# Patient Record
Sex: Female | Born: 1960 | Race: White | Hispanic: Yes | Smoking: Never smoker
Health system: Southern US, Community
[De-identification: ages and names within clinical notes are randomized; demographics above are authoritative.]

## PROBLEM LIST (undated history)

## (undated) DIAGNOSIS — Z7982 Long term (current) use of aspirin: Secondary | ICD-10-CM

## (undated) DIAGNOSIS — D219 Benign neoplasm of connective and other soft tissue, unspecified: Secondary | ICD-10-CM

## (undated) DIAGNOSIS — I209 Angina pectoris, unspecified: Secondary | ICD-10-CM

## (undated) DIAGNOSIS — I25118 Atherosclerotic heart disease of native coronary artery with other forms of angina pectoris: Secondary | ICD-10-CM

## (undated) DIAGNOSIS — I201 Angina pectoris with documented spasm: Secondary | ICD-10-CM

## (undated) DIAGNOSIS — G35D Multiple sclerosis, unspecified: Secondary | ICD-10-CM

## (undated) DIAGNOSIS — N8003 Adenomyosis of the uterus: Secondary | ICD-10-CM

## (undated) DIAGNOSIS — Z8639 Personal history of other endocrine, nutritional and metabolic disease: Secondary | ICD-10-CM

## (undated) DIAGNOSIS — M797 Fibromyalgia: Secondary | ICD-10-CM

## (undated) DIAGNOSIS — K589 Irritable bowel syndrome without diarrhea: Secondary | ICD-10-CM

## (undated) DIAGNOSIS — N189 Chronic kidney disease, unspecified: Secondary | ICD-10-CM

## (undated) DIAGNOSIS — H9190 Unspecified hearing loss, unspecified ear: Secondary | ICD-10-CM

## (undated) DIAGNOSIS — I219 Acute myocardial infarction, unspecified: Secondary | ICD-10-CM

## (undated) DIAGNOSIS — H9192 Unspecified hearing loss, left ear: Secondary | ICD-10-CM

## (undated) DIAGNOSIS — T8859XA Other complications of anesthesia, initial encounter: Secondary | ICD-10-CM

## (undated) DIAGNOSIS — G43909 Migraine, unspecified, not intractable, without status migrainosus: Secondary | ICD-10-CM

## (undated) DIAGNOSIS — I701 Atherosclerosis of renal artery: Secondary | ICD-10-CM

## (undated) DIAGNOSIS — I5189 Other ill-defined heart diseases: Secondary | ICD-10-CM

## (undated) DIAGNOSIS — R000 Tachycardia, unspecified: Secondary | ICD-10-CM

## (undated) DIAGNOSIS — I773 Arterial fibromuscular dysplasia: Secondary | ICD-10-CM

## (undated) DIAGNOSIS — E785 Hyperlipidemia, unspecified: Secondary | ICD-10-CM

## (undated) DIAGNOSIS — R7303 Prediabetes: Secondary | ICD-10-CM

## (undated) DIAGNOSIS — I169 Hypertensive crisis, unspecified: Secondary | ICD-10-CM

## (undated) DIAGNOSIS — F419 Anxiety disorder, unspecified: Secondary | ICD-10-CM

## (undated) DIAGNOSIS — Z86010 Personal history of colonic polyps: Secondary | ICD-10-CM

## (undated) DIAGNOSIS — I499 Cardiac arrhythmia, unspecified: Secondary | ICD-10-CM

## (undated) DIAGNOSIS — I639 Cerebral infarction, unspecified: Secondary | ICD-10-CM

## (undated) DIAGNOSIS — E039 Hypothyroidism, unspecified: Secondary | ICD-10-CM

## (undated) DIAGNOSIS — G8929 Other chronic pain: Secondary | ICD-10-CM

## (undated) DIAGNOSIS — T4145XA Adverse effect of unspecified anesthetic, initial encounter: Secondary | ICD-10-CM

## (undated) DIAGNOSIS — M549 Dorsalgia, unspecified: Secondary | ICD-10-CM

## (undated) DIAGNOSIS — Z9889 Other specified postprocedural states: Secondary | ICD-10-CM

## (undated) DIAGNOSIS — N809 Endometriosis, unspecified: Secondary | ICD-10-CM

## (undated) DIAGNOSIS — Z9289 Personal history of other medical treatment: Secondary | ICD-10-CM

## (undated) DIAGNOSIS — I10 Essential (primary) hypertension: Secondary | ICD-10-CM

## (undated) DIAGNOSIS — N800 Endometriosis of uterus: Secondary | ICD-10-CM

## (undated) DIAGNOSIS — F329 Major depressive disorder, single episode, unspecified: Secondary | ICD-10-CM

## (undated) HISTORY — DX: Essential (primary) hypertension: I10

## (undated) HISTORY — DX: Adenomyosis of the uterus: N80.03

## (undated) HISTORY — PX: COLONOSCOPY: SHX174

## (undated) HISTORY — DX: Anxiety disorder, unspecified: F41.9

## (undated) HISTORY — DX: Major depressive disorder, single episode, unspecified: F32.9

## (undated) HISTORY — DX: Chronic kidney disease, unspecified: N18.9

## (undated) HISTORY — DX: Personal history of other endocrine, nutritional and metabolic disease: Z86.39

## (undated) HISTORY — DX: Personal history of colonic polyps: Z86.010

## (undated) HISTORY — DX: Hyperlipidemia, unspecified: E78.5

## (undated) HISTORY — PX: OTHER SURGICAL HISTORY: SHX169

## (undated) HISTORY — PX: VAGINAL HYSTERECTOMY: SUR661

## (undated) HISTORY — DX: Benign neoplasm of connective and other soft tissue, unspecified: D21.9

## (undated) HISTORY — PX: BACK SURGERY: SHX140

## (undated) HISTORY — DX: Atherosclerosis of renal artery: I70.1

## (undated) HISTORY — DX: Endometriosis, unspecified: N80.9

## (undated) HISTORY — PX: SPINE SURGERY: SHX786

## (undated) HISTORY — DX: Endometriosis of uterus: N80.0

## (undated) HISTORY — DX: Fibromyalgia: M79.7

## (undated) HISTORY — DX: Migraine, unspecified, not intractable, without status migrainosus: G43.909

## (undated) HISTORY — DX: Hypertensive crisis, unspecified: I16.9

## (undated) HISTORY — DX: Acute myocardial infarction, unspecified: I21.9

## (undated) HISTORY — PX: KNEE ARTHROSCOPY: SUR90

## (undated) HISTORY — DX: Personal history of other medical treatment: Z92.89

## (undated) HISTORY — PX: HIP SURGERY: SHX245

## (undated) HISTORY — PX: POLYPECTOMY: SHX149

---

## 1986-06-07 DIAGNOSIS — I10 Essential (primary) hypertension: Secondary | ICD-10-CM

## 1986-06-07 HISTORY — DX: Essential (primary) hypertension: I10

## 1997-06-07 DIAGNOSIS — M797 Fibromyalgia: Secondary | ICD-10-CM

## 1997-06-07 HISTORY — DX: Fibromyalgia: M79.7

## 1998-06-07 DIAGNOSIS — R000 Tachycardia, unspecified: Secondary | ICD-10-CM

## 1998-06-07 DIAGNOSIS — E785 Hyperlipidemia, unspecified: Secondary | ICD-10-CM

## 1998-06-07 DIAGNOSIS — I4711 Inappropriate sinus tachycardia, so stated: Secondary | ICD-10-CM

## 1998-06-07 HISTORY — DX: Tachycardia, unspecified: R00.0

## 1998-06-07 HISTORY — DX: Hyperlipidemia, unspecified: E78.5

## 1998-06-07 HISTORY — DX: Inappropriate sinus tachycardia, so stated: I47.11

## 1999-06-08 DIAGNOSIS — F419 Anxiety disorder, unspecified: Secondary | ICD-10-CM

## 1999-06-08 DIAGNOSIS — F32A Depression, unspecified: Secondary | ICD-10-CM

## 1999-06-08 HISTORY — DX: Anxiety disorder, unspecified: F41.9

## 1999-06-08 HISTORY — DX: Depression, unspecified: F32.A

## 2001-06-07 DIAGNOSIS — Z8639 Personal history of other endocrine, nutritional and metabolic disease: Secondary | ICD-10-CM

## 2001-06-07 DIAGNOSIS — E039 Hypothyroidism, unspecified: Secondary | ICD-10-CM

## 2001-06-07 HISTORY — DX: Hypothyroidism, unspecified: E03.9

## 2001-06-07 HISTORY — DX: Personal history of other endocrine, nutritional and metabolic disease: Z86.39

## 2004-06-07 DIAGNOSIS — I6350 Cerebral infarction due to unspecified occlusion or stenosis of unspecified cerebral artery: Secondary | ICD-10-CM | POA: Insufficient documentation

## 2004-06-07 DIAGNOSIS — I639 Cerebral infarction, unspecified: Secondary | ICD-10-CM

## 2004-06-07 HISTORY — DX: Cerebral infarction, unspecified: I63.9

## 2007-06-08 DIAGNOSIS — I773 Arterial fibromuscular dysplasia: Secondary | ICD-10-CM

## 2007-06-08 DIAGNOSIS — I219 Acute myocardial infarction, unspecified: Secondary | ICD-10-CM

## 2007-06-08 DIAGNOSIS — Z9889 Other specified postprocedural states: Secondary | ICD-10-CM

## 2007-06-08 HISTORY — PX: RENAL ARTERY STENT: SHX2321

## 2007-06-08 HISTORY — PX: CARDIAC CATHETERIZATION: SHX172

## 2007-06-08 HISTORY — DX: Other specified postprocedural states: Z98.890

## 2007-06-08 HISTORY — DX: Arterial fibromuscular dysplasia: I77.3

## 2007-06-08 HISTORY — DX: Acute myocardial infarction, unspecified: I21.9

## 2008-05-13 HISTORY — PX: ROBOTIC ASSISTED LAP VAGINAL HYSTERECTOMY: SHX2362

## 2009-10-11 ENCOUNTER — Encounter (INDEPENDENT_AMBULATORY_CARE_PROVIDER_SITE_OTHER): Payer: Self-pay | Admitting: Nurse Practitioner

## 2010-03-16 ENCOUNTER — Inpatient Hospital Stay (HOSPITAL_COMMUNITY)
Admission: EM | Admit: 2010-03-16 | Discharge: 2010-03-19 | Payer: Self-pay | Source: Home / Self Care | Admitting: Emergency Medicine

## 2010-03-16 ENCOUNTER — Encounter: Payer: Self-pay | Admitting: Cardiology

## 2010-03-17 ENCOUNTER — Ambulatory Visit: Payer: Self-pay | Admitting: Cardiology

## 2010-03-18 ENCOUNTER — Encounter (INDEPENDENT_AMBULATORY_CARE_PROVIDER_SITE_OTHER): Payer: Self-pay | Admitting: Internal Medicine

## 2010-04-09 ENCOUNTER — Ambulatory Visit: Payer: Self-pay | Admitting: Cardiology

## 2010-04-09 ENCOUNTER — Telehealth (INDEPENDENT_AMBULATORY_CARE_PROVIDER_SITE_OTHER): Payer: Self-pay

## 2010-04-09 ENCOUNTER — Ambulatory Visit: Payer: Self-pay | Admitting: Nurse Practitioner

## 2010-04-09 DIAGNOSIS — M549 Dorsalgia, unspecified: Secondary | ICD-10-CM | POA: Insufficient documentation

## 2010-04-09 DIAGNOSIS — R079 Chest pain, unspecified: Secondary | ICD-10-CM | POA: Insufficient documentation

## 2010-04-09 DIAGNOSIS — I959 Hypotension, unspecified: Secondary | ICD-10-CM | POA: Insufficient documentation

## 2010-04-09 LAB — CONVERTED CEMR LAB
Cholesterol, target level: 200 mg/dL
HDL goal, serum: 40 mg/dL
LDL Goal: 70 mg/dL

## 2010-04-10 DIAGNOSIS — I10 Essential (primary) hypertension: Secondary | ICD-10-CM | POA: Insufficient documentation

## 2010-04-10 DIAGNOSIS — E785 Hyperlipidemia, unspecified: Secondary | ICD-10-CM | POA: Insufficient documentation

## 2010-04-10 DIAGNOSIS — F329 Major depressive disorder, single episode, unspecified: Secondary | ICD-10-CM

## 2010-04-10 DIAGNOSIS — F32A Depression, unspecified: Secondary | ICD-10-CM | POA: Insufficient documentation

## 2010-04-10 DIAGNOSIS — I701 Atherosclerosis of renal artery: Secondary | ICD-10-CM | POA: Insufficient documentation

## 2010-04-10 DIAGNOSIS — G43909 Migraine, unspecified, not intractable, without status migrainosus: Secondary | ICD-10-CM | POA: Insufficient documentation

## 2010-04-10 DIAGNOSIS — E039 Hypothyroidism, unspecified: Secondary | ICD-10-CM | POA: Insufficient documentation

## 2010-04-10 DIAGNOSIS — Z9889 Other specified postprocedural states: Secondary | ICD-10-CM | POA: Insufficient documentation

## 2010-04-10 DIAGNOSIS — F419 Anxiety disorder, unspecified: Secondary | ICD-10-CM | POA: Insufficient documentation

## 2010-04-10 DIAGNOSIS — M797 Fibromyalgia: Secondary | ICD-10-CM | POA: Insufficient documentation

## 2010-04-13 ENCOUNTER — Encounter (INDEPENDENT_AMBULATORY_CARE_PROVIDER_SITE_OTHER): Payer: Self-pay | Admitting: Nurse Practitioner

## 2010-04-13 ENCOUNTER — Encounter (HOSPITAL_COMMUNITY)
Admission: RE | Admit: 2010-04-13 | Discharge: 2010-06-06 | Payer: Self-pay | Source: Home / Self Care | Attending: Cardiology | Admitting: Cardiology

## 2010-04-13 ENCOUNTER — Ambulatory Visit: Payer: Self-pay

## 2010-04-13 ENCOUNTER — Encounter: Payer: Self-pay | Admitting: Cardiology

## 2010-04-14 ENCOUNTER — Encounter (INDEPENDENT_AMBULATORY_CARE_PROVIDER_SITE_OTHER): Payer: Self-pay | Admitting: Nurse Practitioner

## 2010-04-23 ENCOUNTER — Ambulatory Visit: Payer: Self-pay | Admitting: Nurse Practitioner

## 2010-04-24 ENCOUNTER — Encounter (INDEPENDENT_AMBULATORY_CARE_PROVIDER_SITE_OTHER): Payer: Self-pay | Admitting: Nurse Practitioner

## 2010-04-24 LAB — CONVERTED CEMR LAB
ALT: 20 units/L (ref 0–35)
Alkaline Phosphatase: 105 units/L (ref 39–117)
CO2: 25 meq/L (ref 19–32)
Cholesterol: 179 mg/dL (ref 0–200)
Creatinine, Ser: 0.83 mg/dL (ref 0.40–1.20)
LDL Cholesterol: 101 mg/dL — ABNORMAL HIGH (ref 0–99)
Sodium: 136 meq/L (ref 135–145)
Total Bilirubin: 0.4 mg/dL (ref 0.3–1.2)
Total CHOL/HDL Ratio: 3.7
Total Protein: 7.6 g/dL (ref 6.0–8.3)
Triglycerides: 146 mg/dL (ref <150)
VLDL: 29 mg/dL (ref 0–40)

## 2010-04-25 ENCOUNTER — Emergency Department (HOSPITAL_COMMUNITY)
Admission: EM | Admit: 2010-04-25 | Discharge: 2010-04-25 | Payer: Self-pay | Source: Home / Self Care | Admitting: Emergency Medicine

## 2010-05-11 ENCOUNTER — Ambulatory Visit: Payer: Self-pay

## 2010-05-11 ENCOUNTER — Ambulatory Visit: Payer: Self-pay | Admitting: Cardiology

## 2010-05-14 ENCOUNTER — Ambulatory Visit: Payer: Self-pay | Admitting: Nurse Practitioner

## 2010-05-18 ENCOUNTER — Ambulatory Visit: Payer: Self-pay | Admitting: Nurse Practitioner

## 2010-05-23 ENCOUNTER — Inpatient Hospital Stay (HOSPITAL_COMMUNITY)
Admission: EM | Admit: 2010-05-23 | Discharge: 2010-05-25 | Payer: Self-pay | Source: Home / Self Care | Attending: Internal Medicine | Admitting: Internal Medicine

## 2010-05-25 ENCOUNTER — Encounter (INDEPENDENT_AMBULATORY_CARE_PROVIDER_SITE_OTHER): Payer: Self-pay | Admitting: Nurse Practitioner

## 2010-05-28 ENCOUNTER — Ambulatory Visit: Payer: Self-pay | Admitting: Nurse Practitioner

## 2010-06-12 ENCOUNTER — Ambulatory Visit
Admission: RE | Admit: 2010-06-12 | Discharge: 2010-06-12 | Payer: Self-pay | Source: Home / Self Care | Attending: Nurse Practitioner | Admitting: Nurse Practitioner

## 2010-06-13 ENCOUNTER — Encounter (INDEPENDENT_AMBULATORY_CARE_PROVIDER_SITE_OTHER): Payer: Self-pay | Admitting: Nurse Practitioner

## 2010-06-16 LAB — CONVERTED CEMR LAB
Free T4: 1.16 ng/dL (ref 0.80–1.80)
TSH: 11.572 microintl units/mL — ABNORMAL HIGH (ref 0.350–4.500)

## 2010-06-26 ENCOUNTER — Ambulatory Visit: Admit: 2010-06-26 | Payer: Self-pay | Admitting: Nurse Practitioner

## 2010-06-30 ENCOUNTER — Ambulatory Visit: Admit: 2010-06-30 | Payer: Self-pay | Admitting: Nurse Practitioner

## 2010-07-01 ENCOUNTER — Encounter (INDEPENDENT_AMBULATORY_CARE_PROVIDER_SITE_OTHER): Payer: Self-pay | Admitting: *Deleted

## 2010-07-07 NOTE — Assessment & Plan Note (Signed)
Summary: eph   Primary Provider:  Daphine Deutscher   History of Present Illness: 50 yo with history of fibromuscular dysplasia of the renal arteries, HTN, and possible coronary vasospasm presents for hospital followup to establish cardiology care.  Patient was admitted to the hospital in [place] in 2009 with severe chest pain.  At that time, she had a left heart cath showing no angiographic coronary disease but the catheter did induce RCA spasm, leading to concern that her chest pain might be coronary vasospasm.  She also had HTN found to be probably related to fibromuscular dysplasia of the renal arteries.  She had a stent placed in the right renal artery in 2009.    She was admitted to [facility] in 10/11 with chest pain.  She ruled out for MI and echo was unremarkable.  It was thought that the chest pain might be related to the stress of her recent move to [place], and amlodipine was increased for treatment of possible coronary vasospasm. Since leaving the hospital, patient has been getting lightheaded with standing.  She is orthostatic by blood pressure today in the office.  She has had no more severe chest pain, like what brought her to the ER.  However, she has been getting chest pressure with exertion.  Walking up a flight of steps will lead to chest pressure.   ECG: NSR, normal  Labs (10/11): LDL 136, HDL 44, TGs 159, creatinine 1.06, K 3.8  Problems Prior to Update: None  Current Medications (verified): 1)  Aspirin 81 Mg Tbec (Aspirin) .... Take One Tablet By Mouth Daily 2)  Plavix 75 Mg Tabs (Clopidogrel Bisulfate) .... Take One Tablet By Mouth Daily 3)  Pravastatin Sodium 40 Mg Tabs (Pravastatin Sodium) .... Take One Tablet By Mouth Daily At Bedtime 4)  Atenolol 100 Mg Tabs (Atenolol) .... Take One Tablet By Mouth Daily 5)  Amlodipine Besylate 10 Mg Tabs (Amlodipine Besylate) .... Take One Tablet By Mouth Daily 6)  Citalopram Hydrobromide 40 Mg Tabs (Citalopram Hydrobromide) .... Please Take  One Tablet By Mouth Daily. 7)  Levothyroxine Sodium 125 Mcg Tabs (Levothyroxine Sodium) .... Please Take One Tablet Two Times A Day. 8)  Fioricet/codeine 50-325-40-30 Mg Caps (Butalbital-Apap-Caff-Cod) .... Please Take As Needed. 9)  Clonazepam 0.5 Mg Tabs (Clonazepam) .... Please Take As Needed. 10)  Vitamin D (Ergocalciferol) 50000 Unit Caps (Ergocalciferol) .... 1.25 Mg Weekly  Allergies (verified): 1)  ! Compazine 2)  ! Imitrex  Past History:  Past Medical History: 1. Migraine headaches 2. Chest pain: No angiographic coronary disease on cath in 2009 in [place].  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  Suspect coronary vasospasm versus microvascular obstruction.  3. HTN: Likely related to renal artery stenosis.  4. 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  She had a renal artery stent in 2009.  5. Hyperlipidemia 6. Fibromyalgia 7. Echo (10/11): EF 60-65%, grade I diastolic dysfunction     Family History: Mother with MI at 44, Father with MI at 71, brother with MI at 73 and died with stroke at 33  Social History: Moved to [place] from [place] with her husband.  Denies smoking cigarettes or using illegal drugs Alcohol Use - no Drug Use - no  Review of Systems       All systems reviewed and negative except as per HPI.   Vital Signs:  Patient profile:   50 year old female Height:      63 inches Weight:      138.25 pounds  Pulse rate:   72 / minute Pulse (ortho):   69 / minute Resp:     15 per minute BP supine:   130 / 80  (right arm) BP sitting:   122 / 70  (right arm) BP standing:   120 / 72  (right arm)  Vitals Entered By: Ellender Hose RN (April 09, 2010 9:20 AM)  Serial Vital Signs/Assessments:  Time      Position  BP       Pulse  Resp  Temp     By           Lying RA  130/80   73                    Ellender Hose RN           Sitting   122/70   [address] RN           Standing  120/72   69                     Ellender Hose RN           Standing  108/78                         Ellender Hose RN           Standing  161/09                         Whitney Maeola Sarah RN  Is Patient Diabetic? No Comments pt has been c/o dizziness at no specific time. SOB with chest pressure.    Physical Exam  General:  Well developed, well nourished, in no acute distress. Head:  normocephalic and atraumatic Nose:  no deformity, discharge, inflammation, or lesions Mouth:  Teeth, gums and palate normal. Oral mucosa normal. Neck:  Neck supple, no JVD. No masses, thyromegaly or abnormal cervical nodes. Lungs:  Clear bilaterally to auscultation and percussion. Heart:  Non-displaced PMI, chest non-tender; regular rate and rhythm, S1, S2 without murmurs, rubs or gallops. Carotid upstroke normal, no bruit.  Pedals normal pulses. No edema, no varicosities. Abdomen:  Bowel sounds positive; abdomen soft and non-tender without masses, organomegaly, or hernias noted. No hepatosplenomegaly. Msk:  Back normal, normal gait. Muscle strength and tone normal. Extremities:  No clubbing or cyanosis. Neurologic:  Alert and oriented x 3. Skin:  Intact without lesions or rashes. Psych:  anxious.     Impression & Recommendations:  Problem # 1:  HYPOTENSION (ICD-458.9) Patient has orthostatic hypotension and gets symptomatic lightheadedness with standing.  I will have her decrease amlodipine to 5 mg daily.  If she continues to have lightheadedness, would next cut atenolol in half.   Problem # 2:  CHEST PAIN (ICD-786.50) I suspect that the patient has either coronary vasospasm or coronary microvascular disease (Syndrome X).  Chest pain with exertion certainly is seen with microvascular disease and she is the right demographic.  She had a cath with no angiographic coronary disease in 2009.  - Continue lower dose of amlodipine for vasospasm prevention.  - Continue atenolol as this can decrease anginal-type pain in patients with  microvascular disease, statin also can help decrease symptoms from microvascular disease.  - I will have her  do an ETT-myoview to rule out evidence for macrovascular disease given her risk factors and her family history of premature CAD.   Problem # 3:  RENAL ARTERY STENOSIS (ICD-440.1) Renal artery stenosis due to fibromuscular dysplasia with stent in 2009.  She has been on Plavix for about 2 years.  It is expensive for her.  I think it is reasonable to stop Plavix and increase ASA to 162 mg daily.  She also needs to get renal artery dopplers.    Other Orders: EKG w/ Interpretation (93000) Nuclear Stress Test (Nuc Stress Test) Renal Artery Duplex (Renal Artery Duplex)  Patient Instructions: 1)  Your physician recommends that you schedule a follow-up appointment in: 1 month with Dr Shirlee Latch 2)  Your physician recommends that you return for a FASTING lipid and liver profile: 12/11 for 272.0 v58.69 3)  Your physician has recommended you make the following change in your medication: Stop Plavix, increase Asa to 81 mg two a day, decrease amlodipine to 5 mg a day 4)  Your physician has requested that you have a renal artery duplex. During this test, an ultrasound is used to evaluate blood flow to the kidneys. Allow one hour for this exam. Do not eat after midnight the day before and avoid carbonated beverages. Take your medications as you usually do. 5)  Your physician has requested that you have an exercise stress myoview.  For further information please visit [email or web address]  Please follow instruction sheet, as given. Prescriptions: AMLODIPINE BESYLATE 5 MG TABS (AMLODIPINE BESYLATE) one daily  #30 x 11   Entered by:   Charolotte Capuchin, RN   Authorized by:   [name], MD   Signed by:   Charolotte Capuchin, RN on 04/09/2010   Method used:   Electronically to        [facility] [phone]* (retail)       [address]       [place], [place]  [address]       Ph: [phone]        Fax: [phone]   RxID:   [id number]

## 2010-07-07 NOTE — Progress Notes (Signed)
Summary: Nuc. Pre-Procedure  Phone Note Outgoing Call Call back at [facility] Phone [phone]   Call placed by: Irean Hong, RN,  April 09, 2010 2:09 PM Summary of Call: Reviewed information on Myoview Information Sheet (see scanned document for further details).  Spoke with patient.     Nuclear Med Background Indications for Stress Test: Evaluation for Ischemia, Post Hospital  Indications Comments: 03/19/10 Discharged: CP, (-) enzymes.  History: Echo, Heart Catheterization  History Comments: '09 Cath: Near NL coronaries,RCA with severe spasms, EF=65%([place]). 03/2010 Echo: EF=60-65%.  Symptoms: Chest Pain, Dizziness, Nausea  Symptoms Comments: CP radiates to RUE.   Nuclear Pre-Procedure Cardiac Risk Factors: Family History - CAD, Hypertension, Lipids, PVD

## 2010-07-07 NOTE — Letter (Signed)
Summary: PT INFORMATION SHEET  PT INFORMATION SHEET   Imported By: Arta Bruce 04/13/2010 16:25:53  _____________________________________________________________________  External Attachment:    Type:   Image     Comment:   External Document

## 2010-07-07 NOTE — Letter (Signed)
[facility]   Imported By: Arta Bruce 04/10/2010 15:04:48  _____________________________________________________________________  External Attachment:    Type:   Image     Comment:   External Document

## 2010-07-07 NOTE — Letter (Signed)
Summary: REQUESTING RECORDS FROM [facility]  REQUESTING RECORDS FROM [facility]   Imported By: Arta Bruce 04/16/2010 15:34:24  _____________________________________________________________________  External Attachment:    Type:   Image     Comment:   External Document

## 2010-07-07 NOTE — Assessment & Plan Note (Signed)
Summary: Cardiology Nuclear Testing  Nuclear Med Background Indications for Stress Test: Evaluation for Ischemia, Post Hospital  Indications Comments: 03/19/10 Discharged: CP, (-) enzymes.  History: Echo, Heart Catheterization  History Comments: '09 Cath: Near NL coronaries,RCA with severe spasms, EF=65%([place]). 03/2010 Echo: EF=60-65%.  Symptoms: Chest Pain, Dizziness, Nausea  Symptoms Comments: CP radiates to RUE.   Nuclear Pre-Procedure Cardiac Risk Factors: Family History - CAD, Hypertension, Lipids, PVD Caffeine/Decaff Intake: none NPO After: 8:30 AM Lungs: clear IV 0.9% NS with Angio Cath: 22g     IV Site: R Hand IV Started by: Cathlyn Parsons, RN Chest Size (in) 34     Cup Size C     Height (in): 139.0 Weight (lb): 136 BMI: 4.97 Tech Comments: Atenolol held x 27hrs.  This patient was scheduled to walk the treadmill, but with upcoming back surgery she was switched to a walking Lexiscan. In recovery she became very symptomatic, extreme fatigue and (L) shoulder pain radiating to her neck and jaw.  Nuclear Med Study 1 or 2 day study:  1 day     Stress Test Type:  Treadmill/Lexiscan Reading MD:  Kristeen Miss, MD     Referring MD:  D.McLean Resting Radionuclide:  Technetium 28m Tetrofosmin     Resting Radionuclide Dose:  10.9 mCi  Stress Radionuclide:  Technetium 52m Tetrofosmin     Stress Radionuclide Dose:  33 mCi   Stress Protocol  Max Systolic BP: 155 mm Hg Lexiscan: 0.4 mg   Stress Test Technologist:  Milana Na, EMT-P     Nuclear Technologist:  Domenic Polite, CNMT  Rest Procedure  Myocardial perfusion imaging was performed at rest 45 minutes following the intravenous administration of Technetium 13m Tetrofosmin.  Stress Procedure  The patient received IV Lexiscan 0.4 mg over 15-seconds with concurrent low level exercise and then Technetium 49m Tetrofosmin was injected at 30-seconds while the patient continued walking one more minute.  There were  no significant changes with Lexiscan.  Quantitative spect images were obtained after a 45 minute delay.  QPS Raw Data Images:  Normal; no motion artifact; normal heart/lung ratio. Stress Images:  Normal homogeneous uptake in all areas of the myocardium. Rest Images:  Normal homogeneous uptake in all areas of the myocardium. Transient Ischemic Dilatation:  0.98  (Normal <1.22)  Lung/Heart Ratio:  0.30  (Normal <0.45)  Quantitative Gated Spect Images QGS EDV:  56 ml QGS ESV:  12 ml QGS EF:  79 % QGS cine images:  Normal motion  Findings Normal nuclear study      Overall Impression  Exercise Capacity: Lexiscan with low level exercise BP Response: Normal blood pressure response. Clinical Symptoms: Significant left shoulder pain radiating to neck. ECG Impression: No significant ST segment change suggestive of ischemia. Overall Impression Comments: The nuclear images are normal There is no scar or ischemia.   Appended Document: Cardiology Nuclear Testing normal study  Appended Document: Cardiology Nuclear Testing pt given results

## 2010-07-07 NOTE — Letter (Signed)
Summary: [facility]//LUMBAR SPINE  [facility] [place]//LUMBAR SPINE   Imported By: Arta Bruce 04/10/2010 15:08:03  _____________________________________________________________________  External Attachment:    Type:   Image     Comment:   External Document

## 2010-07-07 NOTE — Assessment & Plan Note (Signed)
Summary: Hospital F/U   Vital Signs:  Patient profile:   50 year old female Menstrual status:  hysterectomy Height:      139.0 inches Weight:      63.50 pounds BMI:     2.32 BSA:     2.11 Temp:     97.3 degrees F oral Pulse rate:   84 / minute Pulse rhythm:   regular Resp:     20 per minute BP sitting:   110 / 70  (left arm) Cuff size:   regular  Vitals Entered By: Levon Hedger (April 09, 2010 3:21 PM) CC: Depression, Hypertension Management, Lipid Management, Headache Is Patient Diabetic? No Pain Assessment Patient in pain? yes     Location: back Intensity: 10  Does patient need assistance? Functional Status Self care Ambulation Normal     Menstrual Status hysterectomy   CC:  Depression, Hypertension Management, Lipid Management, and Headache.  History of Present Illness:  Pt into the office to establish care. Pt moved to this location from [place] 1 month ago. Pt was recently admitted to the hospital on March 16, 2010 Cardiac enzymes were negative Cardiac cath report from 2009 (received from [place]) showed normal coronary arteries 2D echo was negative  Hypothyroidism - TSH 25.413 in the hospital.  Synthroid was increased from to . Pt will need repeat thyroid function tests in 4-6 weeks  Hyperlipidemia - pt started on zocor and transitioned to pravachol for better control.  LDL goal is 70  Fibromyalgia - Pt was previously prescribed meds but feels like it has not been effective.  Migraine Headaches - triggered during hospital but thought to be secondary to nitro   Hypertension History:      She denies headache, chest pain, and palpitations.  She notes no problems with any antihypertensive medication side effects.        Positive major cardiovascular risk factors include hyperlipidemia and hypertension.  Negative major cardiovascular risk factors include female age less than 3 years old and non-tobacco-user status.    Positive history for target organ damage include prior stroke (or TIA) and peripheral vascular disease.    Lipid Management History:      Positive NCEP/ATP III risk factors include hypertension, prior stroke (or TIA), and peripheral vascular disease.  Negative NCEP/ATP III risk factors include female age less than 75 years old and non-tobacco-user status.        Comments include: med started during hospitalization.        Habits & Providers  Alcohol-Tobacco-Diet     Alcohol drinks/day: 0     Tobacco Status: never  Exercise-Depression-Behavior     Does Patient Exercise: no     Have you felt down or hopeless? yes     Have you felt little pleasure in things? yes     Depression Counseling: further diagnostic testing and/or other treatment is indicated     Drug Use: never  Allergies (verified): 1)  ! Compazine 2)  ! Imitrex  Social History: Smoking Status:  never Drug Use:  never Does Patient Exercise:  no  Review of Systems General:  Complains of sleep disorder; unable to sleep due to back. CV:  Complains of chest pain or discomfort. Resp:  Denies cough. GI:  Denies abdominal pain, nausea, and vomiting. MS:  Complains of joint pain, low back pain, and muscle; radiation of pain down into the right leg. Neuro:  Complains of tingling. Psych:  Complains of depression.  Physical Exam  General:  alert.  Head:  normocephalic.   Lungs:  normal breath sounds.   Heart:  normal rate and regular rhythm.   Abdomen:  normal bowel sounds.   Neurologic:  alert & oriented X3.     Detailed Back/Spine Exam  Lumbosacral Exam:  Inspection-deformity:    Abnormal Palpation-spinal tenderness:  Abnormal Lying Straight Leg Raise:    Right:  positive    Left:  negative   Impression & Recommendations:  Problem # 1:  BACK PAIN (ICD-724.5)  need to review surgery from previous PCP  Orders: Ketorolac-Toradol 15mg  (Z6109) Depo- Medrol 40mg  (J1030) Admin of Therapeutic Inj   intramuscular or subcutaneous (60454)  Her updated medication list for this problem includes:    Naproxen 500 Mg Tabs (Naproxen) ..... One tablet by mouth two times a day as needed for pain    Hydrocodone-acetaminophen 5-500 Mg Tabs (Hydrocodone-acetaminophen) ..... One tablet by mouth daily as needed for pain  Problem # 2:  HYPERTENSION, BENIGN ESSENTIAL (ICD-401.1)  Her updated medication list for this problem includes:    Amlodipine Besylate 5 Mg Tabs (Amlodipine besylate) ..... One daily  Problem # 3:  HYPERLIPIDEMIA (ICD-272.4) will need to recheck labs in the next 2 weeks  Problem # 4:  FIBROMYALGIA (ICD-729.1)  Her updated medication list for this problem includes:    Naproxen 500 Mg Tabs (Naproxen) ..... One tablet by mouth two times a day as needed for pain    Hydrocodone-acetaminophen 5-500 Mg Tabs (Hydrocodone-acetaminophen) ..... One tablet by mouth daily as needed for pain  Complete Medication List: 1)  Amlodipine Besylate 5 Mg Tabs (Amlodipine besylate) .... One daily 2)  Levothyroxine Sodium 125 Mcg Tabs (Levothyroxine sodium) .... Two tablets by mouth daily 3)  Alprazolam 0.5 Mg Tabs (Alprazolam) .... One tablet by mouth daily as needed for anxiety 4)  Savella Titration Pack 12.5 & 25 & 50 Mg Misc (Milnacipran hcl) .... Take according to titration packet 5)  Naproxen 500 Mg Tabs (Naproxen) .... One tablet by mouth two times a day as needed for pain 6)  Hydrocodone-acetaminophen 5-500 Mg Tabs (Hydrocodone-acetaminophen) .... One tablet by mouth daily as needed for pain  Other Orders: Misc. Referral (Misc. Ref)  Hypertension Assessment/Plan:      The patient's hypertensive risk group is category C: Target organ damage and/or diabetes.  Today's blood pressure is 110/70.  Her blood pressure goal is < 140/90.  Lipid Assessment/Plan:      Based on NCEP/ATP III, the patient's risk factor category is "history of coronary disease, peripheral vascular disease, cerebrovascular  disease, or aortic aneurysm".  The patient's lipid goals are as follows: Total cholesterol goal is 200; LDL cholesterol goal is 70; HDL cholesterol goal is 40; Triglyceride goal is 150.  Her LDL cholesterol goal has not been met.    Patient Instructions: 1)  Schedule an appointment for a fasting lab visit in 2 weeks - will need TSH, lipids, CMP. 2)  Sign a release to get records from previous provider in [place] to see your history for your back. 3)  You will get new prescriptions for your medications. Come back to this office tomorrow afternoon for the prescriptions. 4)  Depression - Continue the citalopram 40mg  by mouth daily. 5)  Will need to add additional meds but will determine what it is.  Prescriptions: HYDROCODONE-ACETAMINOPHEN 5-500 MG TABS (HYDROCODONE-ACETAMINOPHEN) One tablet by mouth daily as needed for pain  #30 x 0   Entered and Authorized by:   Lehman Prom FNP   Signed by:  Lehman Prom FNP on 04/10/2010   Method used:   Print then Give to Patient   RxID:   [id number] ALPRAZOLAM 0.5 MG TABS (ALPRAZOLAM) One tablet by mouth daily as needed for anxiety  #30 x 0   Entered and Authorized by:   Lehman Prom FNP   Signed by:   Lehman Prom FNP on 04/10/2010   Method used:   Print then Give to Patient   RxID:   [id number] NAPROXEN 500 MG TABS (NAPROXEN) One tablet by mouth two times a day as needed for pain  #60 x 1   Entered and Authorized by:   Lehman Prom FNP   Signed by:   Lehman Prom FNP on 04/10/2010   Method used:   Print then Give to Patient   RxID:   [id number] LEVOTHYROXINE SODIUM 125 MCG TABS (LEVOTHYROXINE SODIUM) Two tablets by mouth daily  #60 x 1   Entered and Authorized by:   Lehman Prom FNP   Signed by:   Lehman Prom FNP on 04/10/2010   Method used:   Print then Give to Patient   RxID:   [id number] ATENOLOL 100 MG TABS (ATENOLOL) Take one tablet by mouth daily  #30 x 1   Entered and Authorized by:    Lehman Prom FNP   Signed by:   Lehman Prom FNP on 04/10/2010   Method used:   Print then Give to Patient   RxID:   [id number] PRAVASTATIN SODIUM 40 MG TABS (PRAVASTATIN SODIUM) Take one tablet by mouth daily at bedtime  #30 x 1   Entered and Authorized by:   Lehman Prom FNP   Signed by:   Lehman Prom FNP on 04/10/2010   Method used:   Print then Give to Patient   RxID:   [id number]    Medication Administration  Injection # 1:    Medication: Ketorolac-Toradol 15mg     Diagnosis: BACK PAIN (ICD-724.5)    Route: IM    Site: RUOQ gluteus    Exp Date: 10/12    Lot #: [id number]    Mfr: workhardt    Patient tolerated injection without complications    Given by: Levon Hedger (April 09, 2010 4:33 PM)  Injection # 2:    Medication: Depo- Medrol 40mg     Diagnosis: BACK PAIN (ICD-724.5)    Route: IM    Site: LUOQ gluteus    Exp Date: 10/2012    Lot #: [id number]    Mfr: Pharmacia    Patient tolerated injection without complications    Given by: Levon Hedger (April 09, 2010 4:33 PM)  Orders Added: 1)  Est. Patient Level IV [73220] 2)  Ketorolac-Toradol 15mg  [J1885] 3)  Depo- Medrol 40mg  [J1030] 4)  Admin of Therapeutic Inj  intramuscular or subcutaneous [96372] 5)  Misc. Referral [Misc. Ref]     Medication Administration  Injection # 1:    Medication: Ketorolac-Toradol 15mg     Diagnosis: BACK PAIN (ICD-724.5)    Route: IM    Site: RUOQ gluteus    Exp Date: 10/12    Lot #: [id number]    Mfr: workhardt    Patient tolerated injection without complications    Given by: Levon Hedger (April 09, 2010 4:33 PM)  Injection # 2:    Medication: Depo- Medrol 40mg     Diagnosis: BACK PAIN (ICD-724.5)    Route: IM    Site: LUOQ gluteus    Exp Date: 10/2012    Lot #: [id number]    Mfr:  Pharmacia    Patient tolerated injection without complications    Given by: Levon Hedger (April 09, 2010 4:33 PM)  Orders Added: 1)  Est. Patient Level IV  [29528] 2)  Ketorolac-Toradol 15mg  [J1885] 3)  Depo- Medrol 40mg  [J1030] 4)  Admin of Therapeutic Inj  intramuscular or subcutaneous [96372] 5)  Misc. Referral [Misc. Ref]

## 2010-07-07 NOTE — Assessment & Plan Note (Signed)
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  Patient was admitted to the hospital in [place] in 2009 with severe chest pain.  At that time, she had a left heart cath showing no angiographic coronary disease but the catheter did induce RCA spasm, leading to concern that her chest pain might be coronary vasospasm.  She also had HTN found to be probably related to fibromuscular dysplasia of the renal arteries.  She had a stent placed in the right renal artery in 2009.    SInce I last saw her, we did a Lexiscan myoview given ongoing chest pain.  This was a normal study.  Renal artery dopplers done today were suggestive of fibromuscular dysplasia but no significant stenosis.  She is not getting lightheaded since cutting back on amlodipine but her BP is now running higher (140s-150s systolic at home).  She has had one additional episode of severe chest pain: sitting at [employer] on Saturday (before eating lunch) she developed substernal tightness that lasted about 3 minutes then resolved.  She has had severe episodes of milder chest pain that has not been bothersome. No exertional chest pain.     Labs (10/11): LDL 136, HDL 44, TGs 159, creatinine 1.06, K 3.8 Labs (11/11): LDL 101, HDL 49, LFTs normal, K 4.8, creatinine 0.83  Current Medications (verified): 1)  Aspirin 81 Mg Tbec (Aspirin) .... Take Two Daily 2)  Pravastatin Sodium 40 Mg Tabs (Pravastatin Sodium) .... Take One Tablet By Mouth Daily At Bedtime 3)  Atenolol 100 Mg Tabs (Atenolol) .... Take One Tablet By Mouth Daily 4)  Amlodipine Besylate 5 Mg Tabs (Amlodipine Besylate) .... One Daily 5)  Levothyroxine Sodium 200 Mcg Tabs (Levothyroxine Sodium) .... One Tablet By Mouth Daily  (Take With ) 6)  Fioricet/codeine  50-325-40-30 Mg Caps (Butalbital-Apap-Caff-Cod) .... Please Take As Needed. 7)  Alprazolam 0.5 Mg Tabs (Alprazolam) .... One Tablet By Mouth Daily As Needed For Anxiety 8)  Vitamin D (Ergocalciferol) 50000 Unit Caps (Ergocalciferol) .... 1.25 Mg Weekly 9)  Savella Titration Pack 12.5 & 25 & 50 Mg Misc (Milnacipran Hcl) .... Take According To Titration Packet 10)  Naproxen 500 Mg Tabs (Naproxen) .... One Tablet By Mouth Two Times A Day As Needed For Pain 11)  Hydrocodone-Acetaminophen 5-500 Mg Tabs (Hydrocodone-Acetaminophen) .... One Tablet By Mouth Daily As Needed For Pain 12)  Levothyroxine Sodium 25 Mcg Tabs (Levothyroxine Sodium) .... One Tablet By Mouth Daily  (Take With Daily For Total Dose of )  Allergies (verified): 1)  ! Compazine 2)  ! Imitrex  Past History:  Past Medical History: 1. Migraine headaches 2. Chest pain: No angiographic coronary disease on cath in 2009 in [place].  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  Suspect coronary vasospasm versus microvascular obstruction.  Lexiscan myoview (11/11): EF 79%, normal perfusion.  3. HTN: Likely related to renal artery stenosis.  4. 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  She had a renal artery stent in 2009.  Renal artery dopplers (12/11) suggestive of fibromuscular dysplasia but no significant stenosis.  5. Hyperlipidemia 6. Fibromyalgia 7. Echo (10/11): EF 60-65%, grade I diastolic dysfunction     Family History: Reviewed history from 04/09/2010 and no changes required. Mother with MI at 84, Father with MI at 36, brother with MI  at 14 and died with stroke at 7  Social History: Reviewed history from 04/09/2010 and no changes required. Moved to [place] from [place] with her husband.  Denies smoking cigarettes or using illegal drugs Alcohol Use - no Drug Use - no  Review of Systems       All systems reviewed and negative except as per HPI.   Vital Signs:  Patient profile:    50 year old female Menstrual status:  hysterectomy Height:      63 inches Weight:      138 pounds BMI:     24.53 Pulse rate:   78 / minute Resp:     16 per minute BP sitting:   156 / 97  (right arm)  Vitals Entered By: Marrion Coy, CNA (May 11, 2010 8:48 AM)  Physical Exam  General:  Well developed, well nourished, in no acute distress. Neck:  Neck supple, no JVD. No masses, thyromegaly or abnormal cervical nodes. Lungs:  Clear bilaterally to auscultation and percussion. Heart:  Non-displaced PMI, chest non-tender; regular rate and rhythm, S1, S2 without murmurs, rubs or gallops. Carotid upstroke normal, no bruit.  Pedals normal pulses. No edema, no varicosities. Abdomen:  Bowel sounds positive; abdomen soft and non-tender without masses, organomegaly, or hernias noted. No hepatosplenomegaly. Extremities:  No clubbing or cyanosis. Neurologic:  Alert and oriented x 3. Psych:  Normal affect.   Impression & Recommendations:  Problem # 1:  CHEST PAIN (ICD-786.50) I suspect that the patient has either coronary vasospasm or coronary microvascular disease (Syndrome X).  Chest pain with exertion certainly is seen with microvascular disease and she is the right demographic.  She had a cath with no angiographic coronary disease in 2009 and [place] last month showed no evidence for ischemia or infarction.  She continues to have some chest pain.  I will increase amlodipine to 7.5 mg daily (may help with vasospasm if there is a component of this).  If she continues to have significant episodes, could consider Imdur.   Problem # 2:  HYPERLIPIDEMIA (ICD-272.4) Lipids ok, continue statin.   Problem # 3:  HYPERTENSION, BENIGN ESSENTIAL (ICD-401.1) BP running higher after cutting back on amlodipine, will increase amlodipine back to 7.5 mg daily.   Patient Instructions: 1)  Your physician has recommended you make the following change in your medication:  2)  Increase Norvasc  (amlodipine) to 7.5mg  daily--this will be one and one-half 5mg  tablets daily. 3)  Your physician wants you to follow-up in:  4 months with Dr Shirlee Latch.  You will receive a reminder letter in the mail two months in advance. If you don't receive a letter, please call our office to schedule the follow-up appointment. Prescriptions: AMLODIPINE BESYLATE 5 MG TABS (AMLODIPINE BESYLATE) one and one-half  daily  #45 x 6   Entered by:   Katina Dung, RN, BSN   Authorized by:   [name], MD   Signed by:   Katina Dung, RN, BSN on 05/11/2010   Method used:   Electronically to        [facility] [phone]* (retail)       [address]       [place], [place]  [address]       Ph: [phone]       Fax: [phone]   RxID:   [id number]

## 2010-07-07 NOTE — Letter (Signed)
Summary: [facility]//PT SUMMARY  [facility]PT SUMMARY   Imported By: Arta Bruce 04/13/2010 16:28:40  _____________________________________________________________________  External Attachment:    Type:   Image     Comment:   External Document

## 2010-07-07 NOTE — Assessment & Plan Note (Signed)
  Nurse Visit   Allergies: 1)  ! Compazine 2)  ! Imitrex  Orders Added: 1)  Est. Patient Level I [04540] 2)  T-Comprehensive Metabolic Panel [80053-22900] 3)  T-TSH [98119-14782] 4)  T-Lipid Profile [95621-30865]  Appended Document:  pt says she has some irration when she urines and that her urine was brown color... pt says she has pain in her back..... Daphine Deutscher says it was ok to culture urine.... [place] Shannon  April 23, 2010 11:36 AM  Laboratory Results   Urine Tests  Date/Time Received: April 23, 2010 11:35 AM   Routine Urinalysis   Color: yellow Glucose: negative   (Normal Range: Negative) Bilirubin: negative   (Normal Range: Negative) Ketone: negative   (Normal Range: Negative) Spec. Gravity: >=1.030   (Normal Range: 1.003-1.035) Blood: trace-intact   (Normal Range: Negative) pH: 5.0   (Normal Range: 5.0-8.0) Protein: negative   (Normal Range: Negative) Urobilinogen: 0.2   (Normal Range: 0-1) Nitrite: negative   (Normal Range: Negative) Leukocyte Esterace: trace   (Normal Range: Negative)

## 2010-07-07 NOTE — Letter (Signed)
Summary: REQUESTING RECORS FROM [facility]  REQUESTING RECORS FROM [facility]   Imported By: Arta Bruce 04/16/2010 15:31:49  _____________________________________________________________________  External Attachment:    Type:   Image     Comment:   External Document

## 2010-07-09 NOTE — Assessment & Plan Note (Signed)
Summary: Hosipital F/U   Vital Signs:  Patient profile:   50 year old female Menstrual status:  hysterectomy Weight:      138.4 pounds Temp:     97.2 degrees F oral Pulse rate:   80 / minute Pulse rhythm:   regular Resp:     20 per minute BP sitting:   130 / 100  (left arm) Cuff size:   regular  Vitals Entered By: Levon Hedger (May 28, 2010 4:08 PM) CC: follow-up visit [place] stay, Depression, Hypertension Management Is Patient Diabetic? No Pain Assessment Patient in pain? yes     Location: back Intensity: 10  Does patient need assistance? Functional Status Self care Ambulation Normal   Primary Care Simya Tercero:  Daphine Deutscher  CC:  follow-up visit [place] stay, Depression, and Hypertension Management.  History of Present Illness:  Pt into the office for f/u. She actually had this appointment prior to her recent hospitalization Hospitalized from 05/23/2010 to 05/25/2010   Pt is very frustrated due to current living and home situation. Pt admitted that she is not able to afford her medications.  In tears as she speaks about economic hardships  1. Chest pain 2. Dizziness and feeling of being off balance 3.  Migraine headache 4.  History Of coronary artery disease 5.  History of hypertension 6.  Renal Artery Stenosis 7.  Stats post stent placement 8. History of depression 9. Anxiety  10.  Coronary vasospasm 11.  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  The patient is having a depressed mood most of the day and has a diminished interest in her usual daily activities.  Positive alarm features for depression include psychomotor agitation and fatigue (loss of energy).        Psychosocial stress factors include major life changes.  The patient denies that she feels like life is not worth living, denies that she wishes that  she were dead, and denies that she has thought about ending her life.        Comments:  Pt did go to see Aquilla Solian as ordered but she was not able to go to the counselors as ordered.  Hypertension History:      She denies headache, chest pain, and palpitations.  She notes no problems with any antihypertensive medication side effects.  Admittedly pt has not been taking her medications she is not able to afford them.        Positive major cardiovascular risk factors include hyperlipidemia and hypertension.  Negative major cardiovascular risk factors include female age less than 64 years old and non-tobacco-user status.        Positive history for target organ damage include prior stroke (or TIA) and peripheral vascular disease.      Allergies (verified): 1)  ! Compazine 2)  ! Imitrex  Review of Systems CV:  Complains of chest pain or discomfort. Resp:  Denies cough. GI:  Denies abdominal pain, nausea, and vomiting. MS:  Complains of low back pain. Psych:  Complains of depression.  Physical Exam  General:  alert.   Head:  normocephalic.   Ears:  ear piercing(s) noted.   Msk:  up to the exam table today Neurologic:  alert & oriented X3.   Skin:  color normal.   Psych:  Oriented X3.  tearful during exam   Impression &  Recommendations:  Problem # 1:  HYPERTENSION, BENIGN ESSENTIAL (ICD-401.1) PT is not able to afford her medications she has not taken meds since she got out of the hospital.  Rx for amlodipine at pharmacy but pt can't afford it will given samples of bystolic available today in ER The following medications were removed from the medication list:    Atenolol 100 Mg Tabs (Atenolol) .Marland Kitchen... Take one tablet by mouth daily Her updated medication list for this problem includes:    Amlodipine Besylate 5 Mg Tabs (Amlodipine besylate) ..... One and one-half  daily    Bystolic 10 Mg Tabs (Nebivolol hcl) ..... One tablet by mouth daily (samples given)  Problem # 2:   HYPOTHYROIDISM (ICD-244.9) Labs stable when done in the hospital will continue at current dose Her updated medication list for this problem includes:    Levothyroxine Sodium 200 Mcg Tabs (Levothyroxine sodium) ..... One tablet by mouth daily  (take with )    Levothyroxine Sodium 25 Mcg Tabs (Levothyroxine sodium) ..... One tablet by mouth daily  (take with daily for total dose of )  Problem # 3:  HYPERLIPIDEMIA (ICD-272.4) will give samples of crestor Her updated medication list for this problem includes:    Pravastatin Sodium 40 Mg Tabs (Pravastatin sodium) ..... Substitute crestor 10mg  by mouth daily (samples given)  Problem # 4:  BACK PAIN (ICD-724.5) If pt is not able to afford her other meds then doubt she will be able to afford pain meds will have her take tylenol ES OTC for pain ? NSAIDS due to renal history The following medications were removed from the medication list:    Fioricet/codeine 50-325-40-30 Mg Caps (Butalbital-apap-caff-cod) .Marland Kitchen... Please take as needed.    Naproxen 500 Mg Tabs (Naproxen) ..... One tablet by mouth two times a day as needed for pain    Hydrocodone-acetaminophen 5-500 Mg Tabs (Hydrocodone-acetaminophen) ..... One tablet by mouth daily as needed for pain Her updated medication list for this problem includes:    Aspirin 81 Mg Tbec (Aspirin) .Marland Kitchen... Take two daily  Problem # 5:  DEPRESSION (ICD-311) pt is not able to afford fluoxetine will give samples of viibrya The following medications were removed from the medication list:    Alprazolam 0.5 Mg Tabs (Alprazolam) ..... One tablet by mouth daily as needed for anxiety Her updated medication list for this problem includes:    Fluoxetine Hcl 20 Mg Caps (Fluoxetine hcl) ..... Substitute viibrya starter packet (samples given)  Complete Medication List: 1)  Aspirin 81 Mg Tbec (Aspirin) .... Take two daily 2)  Pravastatin Sodium 40 Mg Tabs (Pravastatin sodium) .... Substitute crestor 10mg  by  mouth daily (samples given) 3)  Amlodipine Besylate 5 Mg Tabs (Amlodipine besylate) .... One and one-half  daily 4)  Levothyroxine Sodium 200 Mcg Tabs (Levothyroxine sodium) .... One tablet by mouth daily  (take with ) 5)  Vitamin D (ergocalciferol) 50000 Unit Caps (Ergocalciferol) .... 1.25 mg weekly 6)  Levothyroxine Sodium 25 Mcg Tabs (Levothyroxine sodium) .... One tablet by mouth daily  (take with daily for total dose of ) 7)  Fluoxetine Hcl 20 Mg Caps (Fluoxetine hcl) .... Substitute viibrya starter packet (samples given) 8)  Bystolic 10 Mg Tabs (Nebivolol hcl) .... One tablet by mouth daily (samples given)  Hypertension Assessment/Plan:      The patient's hypertensive risk group is category C: Target organ damage and/or diabetes.  Her calculated 10 year risk of coronary heart disease is 8 %.  Today's blood pressure is  130/100.  Her blood pressure goal is < 140/90.  Patient Instructions: 1)  it seems you are having a difficult time getting your medications 2)  Some of your medications have been substituted for samples of similar medications that are available in the office. 3)  Go directly from this office to [facility] - open envelope once you get there. You should get your thyroid medications filled and take daily. 4)  Take Extra Strength Tylenol for pain since it is essential that you get the medications for your heart, cholesterol and thyroid. 5)  Follow up in 4 weeks with n.martin for medications. Prescriptions: LEVOTHYROXINE SODIUM 25 MCG TABS (LEVOTHYROXINE SODIUM) One tablet by mouth daily  (take with daily for total dose of )  #30 x 3   Entered and Authorized by:   Lehman Prom FNP   Signed by:   Lehman Prom FNP on 05/28/2010   Method used:   Print then Give to Patient   RxID:   [id number] LEVOTHYROXINE SODIUM 200 MCG TABS (LEVOTHYROXINE SODIUM) One tablet by mouth daily  (take with )  #30 x 3   Entered and Authorized by:   Lehman Prom FNP   Signed by:   Lehman Prom FNP on 05/28/2010   Method used:   Print then Give to Patient   RxID:   [id number]    Orders Added: 1)  Est. Patient Level IV [84696]

## 2010-07-09 NOTE — Letter (Signed)
Summary: *HSN Results Follow up  [facility]  [address] [place], [place] [address]   Phone: [phone]  Fax: [phone]      07/01/2010   ENIYAH EASTMOND [address] [place], [place]  [address]   Dear  Ms. Monea GUEVARA,                            ____S.Drinkard,FNP   ____D. Gore,FNP       ____B. McPherson,MD   ____V. Rankins,MD    ____E. Mulberry,MD    ____N. Daphine Deutscher, FNP  ____D. Reche Dixon, MD    ____K. Philipp Deputy, MD    ____Other     This letter is to inform you that your recent test(s):  _______Pap Smear    _______Lab Test     _______X-ray    _______ requires a follow-up visit with your provider   Comments:  We have been trying to reach you at [phone].  Please contact the office at your earliest convenience.       _________________________________________________________ If you have any questions, please contact our office                     Sincerely,  Levon Hedger [facility]

## 2010-07-16 ENCOUNTER — Observation Stay (HOSPITAL_COMMUNITY)
Admission: EM | Admit: 2010-07-16 | Discharge: 2010-07-17 | Disposition: A | Discharge status: Home / Self Care | Payer: Medicaid Other | Attending: Internal Medicine | Admitting: Internal Medicine

## 2010-07-16 DIAGNOSIS — R079 Chest pain, unspecified: Principal | ICD-10-CM | POA: Insufficient documentation

## 2010-07-16 DIAGNOSIS — R0602 Shortness of breath: Secondary | ICD-10-CM | POA: Insufficient documentation

## 2010-07-16 DIAGNOSIS — F329 Major depressive disorder, single episode, unspecified: Secondary | ICD-10-CM | POA: Insufficient documentation

## 2010-07-16 DIAGNOSIS — IMO0001 Reserved for inherently not codable concepts without codable children: Secondary | ICD-10-CM | POA: Insufficient documentation

## 2010-07-16 DIAGNOSIS — G43909 Migraine, unspecified, not intractable, without status migrainosus: Secondary | ICD-10-CM | POA: Insufficient documentation

## 2010-07-16 DIAGNOSIS — E039 Hypothyroidism, unspecified: Secondary | ICD-10-CM | POA: Insufficient documentation

## 2010-07-16 DIAGNOSIS — I10 Essential (primary) hypertension: Secondary | ICD-10-CM | POA: Insufficient documentation

## 2010-07-16 DIAGNOSIS — R110 Nausea: Secondary | ICD-10-CM | POA: Insufficient documentation

## 2010-07-16 DIAGNOSIS — F3289 Other specified depressive episodes: Secondary | ICD-10-CM | POA: Insufficient documentation

## 2010-07-16 DIAGNOSIS — F411 Generalized anxiety disorder: Secondary | ICD-10-CM | POA: Insufficient documentation

## 2010-07-16 DIAGNOSIS — I2510 Atherosclerotic heart disease of native coronary artery without angina pectoris: Secondary | ICD-10-CM | POA: Insufficient documentation

## 2010-07-17 ENCOUNTER — Emergency Department (HOSPITAL_COMMUNITY): Payer: Medicaid Other

## 2010-07-17 DIAGNOSIS — R0789 Other chest pain: Secondary | ICD-10-CM

## 2010-07-17 LAB — COMPREHENSIVE METABOLIC PANEL
AST: 18 U/L (ref 0–37)
Albumin: 3.5 g/dL (ref 3.5–5.2)
Calcium: 8.7 mg/dL (ref 8.4–10.5)
Chloride: 104 mEq/L (ref 96–112)
Creatinine, Ser: 1.01 mg/dL (ref 0.4–1.2)
GFR calc Af Amer: >60 mL/min (ref >60)

## 2010-07-17 LAB — TROPONIN I: Troponin I: <0.01 ng/mL (ref 0.00–0.06)

## 2010-07-17 LAB — CARDIAC PANEL(CRET KIN+CKTOT+MB+TROPI)
CK, MB: 0.5 ng/mL (ref 0.3–4.0)
Relative Index: INVALID (ref 0.0–2.5)
Total CK: 86 U/L (ref 7–177)
Troponin I: <0.01 ng/mL (ref 0.00–0.06)

## 2010-07-17 LAB — CBC
HCT: 35.3 % — ABNORMAL LOW (ref 36.0–46.0)
Hemoglobin: 12.5 g/dL (ref 12.0–15.0)
MCH: 30.3 pg (ref 26.0–34.0)
MCH: 31.4 pg (ref 26.0–34.0)
MCHC: 35.4 g/dL (ref 30.0–36.0)
Platelets: 283 10*3/uL (ref 150–400)
RBC: 3.73 MIL/uL — ABNORMAL LOW (ref 3.87–5.11)
RDW: 12.6 % (ref 11.5–15.5)

## 2010-07-17 LAB — URINALYSIS, ROUTINE W REFLEX MICROSCOPIC
Hgb urine dipstick: NEGATIVE
Nitrite: NEGATIVE
Protein, ur: NEGATIVE mg/dL
Urobilinogen, UA: 1 mg/dL (ref 0.0–1.0)

## 2010-07-17 LAB — LIPID PANEL
Cholesterol: 163 mg/dL (ref 0–200)
Total CHOL/HDL Ratio: 5.1 RATIO

## 2010-07-17 LAB — POCT CARDIAC MARKERS
CKMB, poc: <1 ng/mL — ABNORMAL LOW (ref 1.0–8.0)
Troponin i, poc: <0.05 ng/mL (ref 0.00–0.09)

## 2010-07-17 LAB — D-DIMER, QUANTITATIVE: D-Dimer, Quant: 0.39 ug/mL-FEU (ref 0.00–0.48)

## 2010-07-17 LAB — BASIC METABOLIC PANEL
Calcium: 9 mg/dL (ref 8.4–10.5)
GFR calc Af Amer: >60 mL/min (ref >60)
GFR calc non Af Amer: >60 mL/min (ref >60)
Potassium: 3.9 mEq/L (ref 3.5–5.1)
Sodium: 138 mEq/L (ref 135–145)

## 2010-07-17 LAB — DIFFERENTIAL
Eosinophils Relative: 2 % (ref 0–5)
Lymphocytes Relative: 28 % (ref 12–46)
Monocytes Absolute: 0.5 10*3/uL (ref 0.1–1.0)
Monocytes Relative: 8 % (ref 3–12)
Neutro Abs: 4.2 10*3/uL (ref 1.7–7.7)

## 2010-07-17 LAB — POCT PREGNANCY, URINE: Preg Test, Ur: NEGATIVE

## 2010-07-17 LAB — CK TOTAL AND CKMB (NOT AT ARMC)
CK, MB: 0.5 ng/mL (ref 0.3–4.0)
Total CK: 97 U/L (ref 7–177)

## 2010-07-31 ENCOUNTER — Emergency Department (HOSPITAL_COMMUNITY)
Admission: EM | Admit: 2010-07-31 | Discharge: 2010-07-31 | Disposition: A | Discharge status: Home / Self Care | Payer: Medicaid Other | Attending: Emergency Medicine | Admitting: Emergency Medicine

## 2010-07-31 ENCOUNTER — Emergency Department (HOSPITAL_COMMUNITY): Payer: Medicaid Other

## 2010-07-31 DIAGNOSIS — R109 Unspecified abdominal pain: Secondary | ICD-10-CM | POA: Insufficient documentation

## 2010-07-31 DIAGNOSIS — I10 Essential (primary) hypertension: Secondary | ICD-10-CM | POA: Insufficient documentation

## 2010-07-31 DIAGNOSIS — R197 Diarrhea, unspecified: Secondary | ICD-10-CM | POA: Insufficient documentation

## 2010-07-31 DIAGNOSIS — Z9889 Other specified postprocedural states: Secondary | ICD-10-CM | POA: Insufficient documentation

## 2010-07-31 DIAGNOSIS — I2510 Atherosclerotic heart disease of native coronary artery without angina pectoris: Secondary | ICD-10-CM | POA: Insufficient documentation

## 2010-07-31 DIAGNOSIS — E7800 Pure hypercholesterolemia, unspecified: Secondary | ICD-10-CM | POA: Insufficient documentation

## 2010-07-31 DIAGNOSIS — Z8673 Personal history of transient ischemic attack (TIA), and cerebral infarction without residual deficits: Secondary | ICD-10-CM | POA: Insufficient documentation

## 2010-07-31 DIAGNOSIS — G43909 Migraine, unspecified, not intractable, without status migrainosus: Secondary | ICD-10-CM | POA: Insufficient documentation

## 2010-07-31 DIAGNOSIS — M545 Low back pain, unspecified: Secondary | ICD-10-CM | POA: Insufficient documentation

## 2010-07-31 DIAGNOSIS — I252 Old myocardial infarction: Secondary | ICD-10-CM | POA: Insufficient documentation

## 2010-07-31 DIAGNOSIS — F341 Dysthymic disorder: Secondary | ICD-10-CM | POA: Insufficient documentation

## 2010-07-31 DIAGNOSIS — Z79899 Other long term (current) drug therapy: Secondary | ICD-10-CM | POA: Insufficient documentation

## 2010-07-31 DIAGNOSIS — R112 Nausea with vomiting, unspecified: Secondary | ICD-10-CM | POA: Insufficient documentation

## 2010-07-31 DIAGNOSIS — H53489 Generalized contraction of visual field, unspecified eye: Secondary | ICD-10-CM | POA: Insufficient documentation

## 2010-07-31 DIAGNOSIS — H538 Other visual disturbances: Secondary | ICD-10-CM | POA: Insufficient documentation

## 2010-07-31 DIAGNOSIS — G8929 Other chronic pain: Secondary | ICD-10-CM | POA: Insufficient documentation

## 2010-07-31 LAB — DIFFERENTIAL
Basophils Relative: 0 % (ref 0–1)
Eosinophils Absolute: 0.2 10*3/uL (ref 0.0–0.7)
Eosinophils Relative: 2 % (ref 0–5)
Lymphs Abs: 1.7 10*3/uL (ref 0.7–4.0)
Monocytes Relative: 6 % (ref 3–12)
Neutrophils Relative %: 72 % (ref 43–77)

## 2010-07-31 LAB — URINALYSIS, ROUTINE W REFLEX MICROSCOPIC
Bilirubin Urine: NEGATIVE
Hgb urine dipstick: NEGATIVE
Ketones, ur: 15 mg/dL — AB
Nitrite: NEGATIVE
Protein, ur: NEGATIVE mg/dL
Specific Gravity, Urine: 1.013 (ref 1.005–1.030)
Urine Glucose, Fasting: NEGATIVE mg/dL
Urobilinogen, UA: 1 mg/dL (ref 0.0–1.0)
pH: 8 (ref 5.0–8.0)

## 2010-07-31 LAB — HEPATIC FUNCTION PANEL
ALT: 18 U/L (ref 0–35)
AST: 15 U/L (ref 0–37)
Albumin: 4 g/dL (ref 3.5–5.2)
Alkaline Phosphatase: 82 U/L (ref 39–117)
Bilirubin, Direct: <0.1 mg/dL (ref 0.0–0.3)
Total Bilirubin: 0.4 mg/dL (ref 0.3–1.2)
Total Protein: 7 g/dL (ref 6.0–8.3)

## 2010-07-31 LAB — BASIC METABOLIC PANEL
BUN: 13 mg/dL (ref 6–23)
Calcium: 9.5 mg/dL (ref 8.4–10.5)
Chloride: 104 mEq/L (ref 96–112)
Creatinine, Ser: 0.96 mg/dL (ref 0.4–1.2)
GFR calc Af Amer: >60 mL/min (ref >60)
GFR calc non Af Amer: >60 mL/min (ref >60)

## 2010-07-31 LAB — CBC
MCH: 31.4 pg (ref 26.0–34.0)
MCV: 86.3 fL (ref 78.0–100.0)
Platelets: 368 10*3/uL (ref 150–400)
RBC: 4.37 MIL/uL (ref 3.87–5.11)
RDW: 11.9 % (ref 11.5–15.5)

## 2010-07-31 LAB — LIPASE, BLOOD: Lipase: 22 U/L (ref 11–59)

## 2010-07-31 LAB — POCT PREGNANCY, URINE: Preg Test, Ur: NEGATIVE

## 2010-08-03 ENCOUNTER — Encounter: Payer: Medicaid Other | Admitting: Cardiology

## 2010-08-03 NOTE — H&P (Signed)
NAMEAVO, SCHLACHTER            ACCOUNT NO.:  [id number]  MEDICAL RECORD NO.:  [id number]           PATIENT TYPE:  E  LOCATION:  [facility]                         FACILITY:  [facility]  PHYSICIAN:  Eduard Clos, MDDATE OF BIRTH:  [birth year]  DATE OF ADMISSION:  07/16/2010 DATE OF DISCHARGE:                             HISTORY & PHYSICAL   PRIMARY CARE PHYSICIAN:  Della Goo, M.D.  CHIEF COMPLAINT:  Chest pain.  HISTORY OF PRESENT ILLNESS:  This is a 50 year old female with known history of hypothyroidism, migraine, renal artery stent, history of CAD with history of vasospasms during cardiac cath in 2009, depression, and anxiety.  Has been experiencing chest pain over the last 2-3 days.  The chest pain is more on the left anterior chest wall, pricking in nature, nonradiating, has no relation to exertion, lasts for less than a minute each time, multiple times a day.  No associated shortness of breath, nausea, vomiting, or diaphoresis.  Denies any fever, chills, cough, abdominal pain, dysuria, discharge, diarrhea, dizziness, loss of consciousness.  At this time, patient's cardiac enzymes have been negative, has been admitted for further workup.  PAST MEDICAL HISTORY:  History of renal artery stenosis status post stenting, history of hypertension, history of stent placement for renal artery stenosis, history of coronary vasospasm during cardiac cath in 2009, depression, anxiety, and hypothyroidism.  MEDICATIONS PRIOR TO ADMISSION: 1. The patient is on Fioricet. 2. Nitroglycerin p.r.n. 3. Atenolol 100 mg daily at bedtime. 4. Imdur 30 mg daily. 5. Oxycodone 10 mg q.6 p.r.n. 6. Vitamin D2. 7. Pravachol 40 mg. 8. Naprosyn. 9. Levothyroxine 225 mcg p.o. daily. 10.Amlodipine 10 mg p.o. daily. 11.Aspirin 81 mg daily. 12.Alprazolam 0.5 mg as needed p.r.n. for anxiety.  ALLERGIES:  PROCHLORPERAZINE and SUMATRIPTAN.  PAST SURGICAL HISTORY:  Hysterectomy.  FAMILY HISTORY:   Coronary artery disease.  SOCIAL HISTORY:  The patient is married, lives with her husband.  Denies smoking cigarettes, drinking alcohol, or using illegal drugs.  REVIEW OF SYSTEMS:  As per history of presenting illness, nothing else significant.  PHYSICAL EXAMINATION:  GENERAL:  The patient is examined at bedside, not in acute distress. VITAL SIGNS:  Blood pressure is 127/78, pulse 81 per minute, temperature 97.7, respirations 18 per minute, and O2 sat is 98%.  HEENT:  Anicteric. No pallor.  No discharge from ears, eyes, nose or mouth. CHEST:  Bilateral air entry present.  No rhonchi.  No crepitation. HEART:  S1 and S2 heard. ABDOMEN:  Soft, nontender.  Bowel sounds heard. CENTRAL NERVOUS SYSTEM:  Patient is alert, awake, and oriented to time, place, and person.  Moves upper and lower extremities, 5/5. EXTREMITIES:  Peripheral pulses felt.  No edema.  LABORATORY DATA:  EKG shows normal sinus rhythm with nonspecific ST-T changes.  Heart rate is around 83 beats per minute.  Chest x-ray:  No acute abnormalities.  CBC:  WBC 6.8, hemoglobin 12.5, hematocrit 35.3, and platelets 215.  PT/INR is 13.5 and 1.  His D-dimer is 0.39.  Basic metabolic panel:  Sodium 138, potassium 3.9, chloride 105, carbon dioxide 25, glucose 103, BUN 10, creatinine 0.8, calcium 9, CK-MB is less than  1, troponin-I less than 0.05.  Pregnancy screen is negative.  ASSESSMENT: 1. Chest pain, rule out acute coronary syndrome. 2. History of coronary artery disease. 3. History of coronary vasospasm during cardiac cath. 4. History of renal artery stenosis. 5. History of hypothyroidism. 6. History of hypertension. 7. History of depression and anxiety.  PLAN: 1. At this time, we will admit the patient to telemetry. 2. We will cycle cardiac markers. 3. The patient will be on aspirin and nitroglycerin p.r.n. 4. At this time, we will continue present home medications. 5. Further recommendations based on test results  and clinical course.     Eduard Clos, MD     ANK/MEDQ  D:  07/17/2010  T:  07/17/2010  Job:  [id number]  cc:   Della Goo, M.D.  Electronically Signed by Midge Minium MD on 08/03/2010 04:50:45 PM

## 2010-08-04 NOTE — Letter (Signed)
Summary: RECORDS RECEIVED FROM [facility]  RECORDS RECEIVED FROM [facility]   Imported By: Arta Bruce 07/29/2010 08:51:56  _____________________________________________________________________  External Attachment:    Type:   Image     Comment:   External Document

## 2010-08-04 NOTE — Letter (Signed)
Summary: RECEIVED REQUESTED RECORDS FROM BEHJET NADEEM,MD//[facility]  RECEIVED REQUESTED RECORDS FROM BEHJET NADEEM,MD//HISTORICAL CHART   Imported By: Arta Bruce 07/30/2010 11:23:54  _____________________________________________________________________  External Attachment:    Type:   Image     Comment:   External Document

## 2010-08-17 ENCOUNTER — Emergency Department (HOSPITAL_COMMUNITY): Payer: Medicaid Other

## 2010-08-17 ENCOUNTER — Emergency Department (HOSPITAL_COMMUNITY)
Admission: EM | Admit: 2010-08-17 | Discharge: 2010-08-17 | Disposition: A | Discharge status: Home / Self Care | Payer: Medicaid Other | Attending: Emergency Medicine | Admitting: Emergency Medicine

## 2010-08-17 DIAGNOSIS — R42 Dizziness and giddiness: Secondary | ICD-10-CM | POA: Insufficient documentation

## 2010-08-17 DIAGNOSIS — R1110 Vomiting, unspecified: Secondary | ICD-10-CM | POA: Insufficient documentation

## 2010-08-17 DIAGNOSIS — Z7982 Long term (current) use of aspirin: Secondary | ICD-10-CM | POA: Insufficient documentation

## 2010-08-17 DIAGNOSIS — R002 Palpitations: Secondary | ICD-10-CM | POA: Insufficient documentation

## 2010-08-17 DIAGNOSIS — R109 Unspecified abdominal pain: Secondary | ICD-10-CM | POA: Insufficient documentation

## 2010-08-17 DIAGNOSIS — G8929 Other chronic pain: Secondary | ICD-10-CM | POA: Insufficient documentation

## 2010-08-17 DIAGNOSIS — F341 Dysthymic disorder: Secondary | ICD-10-CM | POA: Insufficient documentation

## 2010-08-17 DIAGNOSIS — R350 Frequency of micturition: Secondary | ICD-10-CM | POA: Insufficient documentation

## 2010-08-17 DIAGNOSIS — I252 Old myocardial infarction: Secondary | ICD-10-CM | POA: Insufficient documentation

## 2010-08-17 DIAGNOSIS — R634 Abnormal weight loss: Secondary | ICD-10-CM | POA: Insufficient documentation

## 2010-08-17 DIAGNOSIS — I10 Essential (primary) hypertension: Secondary | ICD-10-CM | POA: Insufficient documentation

## 2010-08-17 DIAGNOSIS — R55 Syncope and collapse: Secondary | ICD-10-CM | POA: Insufficient documentation

## 2010-08-17 DIAGNOSIS — R5381 Other malaise: Secondary | ICD-10-CM | POA: Insufficient documentation

## 2010-08-17 DIAGNOSIS — Z8673 Personal history of transient ischemic attack (TIA), and cerebral infarction without residual deficits: Secondary | ICD-10-CM | POA: Insufficient documentation

## 2010-08-17 DIAGNOSIS — I2510 Atherosclerotic heart disease of native coronary artery without angina pectoris: Secondary | ICD-10-CM | POA: Insufficient documentation

## 2010-08-17 DIAGNOSIS — E7800 Pure hypercholesterolemia, unspecified: Secondary | ICD-10-CM | POA: Insufficient documentation

## 2010-08-17 DIAGNOSIS — Z79899 Other long term (current) drug therapy: Secondary | ICD-10-CM | POA: Insufficient documentation

## 2010-08-17 DIAGNOSIS — M549 Dorsalgia, unspecified: Secondary | ICD-10-CM | POA: Insufficient documentation

## 2010-08-17 LAB — POCT I-STAT, CHEM 8
BUN: 13 mg/dL (ref 6–23)
Calcium, Ion: 1.07 mmol/L — ABNORMAL LOW (ref 1.12–1.32)
Chloride: 104 mEq/L (ref 96–112)
Creatinine, Ser: 0.9 mg/dL (ref 0.4–1.2)
Glucose, Bld: 91 mg/dL (ref 70–99)

## 2010-08-17 LAB — BASIC METABOLIC PANEL
BUN: 8 mg/dL (ref 6–23)
CO2: 28 mEq/L (ref 19–32)
Calcium: 9.6 mg/dL (ref 8.4–10.5)
Chloride: 102 mEq/L (ref 96–112)
Creatinine, Ser: 1.04 mg/dL (ref 0.4–1.2)

## 2010-08-17 LAB — POCT CARDIAC MARKERS
Myoglobin, poc: 69.2 ng/mL (ref 12–200)
Troponin i, poc: <0.05 ng/mL (ref 0.00–0.09)
Troponin i, poc: <0.05 ng/mL (ref 0.00–0.09)
Troponin i, poc: <0.05 ng/mL (ref 0.00–0.09)
Troponin i, poc: <0.05 ng/mL (ref 0.00–0.09)

## 2010-08-17 LAB — CBC
HCT: 39.8 % (ref 36.0–46.0)
Hemoglobin: 14.3 g/dL (ref 12.0–15.0)
MCH: 31.7 pg (ref 26.0–34.0)
MCHC: 36.9 g/dL — ABNORMAL HIGH (ref 30.0–36.0)
Platelets: 370 10*3/uL (ref 150–400)
RBC: 4.92 MIL/uL (ref 3.87–5.11)
WBC: 9.8 10*3/uL (ref 4.0–10.5)

## 2010-08-17 LAB — DIFFERENTIAL
Basophils Absolute: 0 10*3/uL (ref 0.0–0.1)
Basophils Absolute: 0 10*3/uL (ref 0.0–0.1)
Basophils Relative: 0 % (ref 0–1)
Eosinophils Absolute: 0.1 10*3/uL (ref 0.0–0.7)
Lymphocytes Relative: 22 % (ref 12–46)
Lymphs Abs: 2.2 10*3/uL (ref 0.7–4.0)
Monocytes Absolute: 0.5 10*3/uL (ref 0.1–1.0)
Monocytes Relative: 5 % (ref 3–12)
Monocytes Relative: 6 % (ref 3–12)
Neutro Abs: 9.9 10*3/uL — ABNORMAL HIGH (ref 1.7–7.7)
Neutro Abs: 6.8 10*3/uL (ref 1.7–7.7)
Neutrophils Relative %: 86 % — ABNORMAL HIGH (ref 43–77)

## 2010-08-17 LAB — URINE MICROSCOPIC-ADD ON

## 2010-08-17 LAB — URINALYSIS, ROUTINE W REFLEX MICROSCOPIC
Hgb urine dipstick: NEGATIVE
Specific Gravity, Urine: 1.027 (ref 1.005–1.030)
Urobilinogen, UA: 1 mg/dL (ref 0.0–1.0)

## 2010-08-17 LAB — CARDIAC PANEL(CRET KIN+CKTOT+MB+TROPI)
Relative Index: INVALID (ref 0.0–2.5)
Total CK: 40 U/L (ref 7–177)
Total CK: 55 U/L (ref 7–177)

## 2010-08-17 LAB — TROPONIN I: Troponin I: 0.01 ng/mL (ref 0.00–0.06)

## 2010-08-18 LAB — CBC
MCH: 30.9 pg (ref 26.0–34.0)
MCHC: 36.3 g/dL — ABNORMAL HIGH (ref 30.0–36.0)
MCV: 85.3 fL (ref 78.0–100.0)
Platelets: 342 10*3/uL (ref 150–400)
RDW: 12.3 % (ref 11.5–15.5)
WBC: 9.4 10*3/uL (ref 4.0–10.5)

## 2010-08-18 LAB — BASIC METABOLIC PANEL
BUN: 15 mg/dL (ref 6–23)
CO2: 26 mEq/L (ref 19–32)
Chloride: 97 mEq/L (ref 96–112)
Creatinine, Ser: 0.84 mg/dL (ref 0.4–1.2)
Glucose, Bld: 93 mg/dL (ref 70–99)

## 2010-08-18 LAB — DIFFERENTIAL
Basophils Absolute: 0 10*3/uL (ref 0.0–0.1)
Basophils Relative: 0 % (ref 0–1)
Eosinophils Absolute: 0 10*3/uL (ref 0.0–0.7)
Eosinophils Relative: 0 % (ref 0–5)

## 2010-08-18 LAB — POCT CARDIAC MARKERS: Myoglobin, poc: 50.9 ng/mL (ref 12–200)

## 2010-08-18 LAB — URINALYSIS, ROUTINE W REFLEX MICROSCOPIC
Nitrite: NEGATIVE
Protein, ur: NEGATIVE mg/dL
Specific Gravity, Urine: 1.021 (ref 1.005–1.030)
Urobilinogen, UA: 0.2 mg/dL (ref 0.0–1.0)

## 2010-08-18 LAB — HEPATIC FUNCTION PANEL
Indirect Bilirubin: 0.6 mg/dL (ref 0.3–0.9)
Total Protein: 7.3 g/dL (ref 6.0–8.3)

## 2010-08-18 LAB — LIPASE, BLOOD: Lipase: 20 U/L (ref 11–59)

## 2010-08-20 LAB — CARDIAC PANEL(CRET KIN+CKTOT+MB+TROPI)
CK, MB: 0.7 ng/mL (ref 0.3–4.0)
CK, MB: 0.9 ng/mL (ref 0.3–4.0)
Relative Index: INVALID (ref 0.0–2.5)
Total CK: 99 U/L (ref 7–177)
Troponin I: 0.01 ng/mL (ref 0.00–0.06)

## 2010-08-20 LAB — MRSA PCR SCREENING: MRSA by PCR: NEGATIVE

## 2010-08-20 LAB — BASIC METABOLIC PANEL
CO2: 26 mEq/L (ref 19–32)
Calcium: 8.4 mg/dL (ref 8.4–10.5)
Chloride: 109 mEq/L (ref 96–112)
Creatinine, Ser: 1.06 mg/dL (ref 0.4–1.2)
Creatinine, Ser: 1.27 mg/dL — ABNORMAL HIGH (ref 0.4–1.2)
GFR calc Af Amer: 54 mL/min — ABNORMAL LOW (ref >60)
GFR calc Af Amer: >60 mL/min (ref >60)
GFR calc non Af Amer: 55 mL/min — ABNORMAL LOW (ref >60)
Potassium: 3.7 mEq/L (ref 3.5–5.1)
Sodium: 137 mEq/L (ref 135–145)
Sodium: 139 mEq/L (ref 135–145)

## 2010-08-20 LAB — LIPID PANEL
Cholesterol: 212 mg/dL — ABNORMAL HIGH (ref 0–200)
LDL Cholesterol: 136 mg/dL — ABNORMAL HIGH (ref 0–99)
Total CHOL/HDL Ratio: 4.8 RATIO

## 2010-08-20 LAB — COMPREHENSIVE METABOLIC PANEL
ALT: 17 U/L (ref 0–35)
AST: 20 U/L (ref 0–37)
CO2: 25 mEq/L (ref 19–32)
Chloride: 107 mEq/L (ref 96–112)
Creatinine, Ser: 1.08 mg/dL (ref 0.4–1.2)
GFR calc Af Amer: >60 mL/min (ref >60)
GFR calc non Af Amer: 54 mL/min — ABNORMAL LOW (ref >60)
Glucose, Bld: 92 mg/dL (ref 70–99)
Sodium: 139 mEq/L (ref 135–145)
Total Bilirubin: 0.4 mg/dL (ref 0.3–1.2)

## 2010-08-20 LAB — POCT PREGNANCY, URINE: Preg Test, Ur: NEGATIVE

## 2010-08-20 LAB — DIFFERENTIAL
Basophils Relative: 1 % (ref 0–1)
Eosinophils Absolute: 0.2 10*3/uL (ref 0.0–0.7)
Eosinophils Relative: 2 % (ref 0–5)
Lymphocytes Relative: 23 % (ref 12–46)
Lymphs Abs: 1.8 10*3/uL (ref 0.7–4.0)
Lymphs Abs: 1.8 10*3/uL (ref 0.7–4.0)
Monocytes Absolute: 0.5 10*3/uL (ref 0.1–1.0)
Monocytes Relative: 6 % (ref 3–12)
Monocytes Relative: 6 % (ref 3–12)
Neutro Abs: 3.6 10*3/uL (ref 1.7–7.7)
Neutrophils Relative %: 59 % (ref 43–77)

## 2010-08-20 LAB — PROTIME-INR
INR: 1.04 (ref 0.00–1.49)
Prothrombin Time: 13.8 seconds (ref 11.6–15.2)

## 2010-08-20 LAB — TROPONIN I: Troponin I: 0.01 ng/mL (ref 0.00–0.06)

## 2010-08-20 LAB — URINE MICROSCOPIC-ADD ON

## 2010-08-20 LAB — CBC
HCT: 37 % (ref 36.0–46.0)
HCT: 37.1 % (ref 36.0–46.0)
Hemoglobin: 12.9 g/dL (ref 12.0–15.0)
Hemoglobin: 12.9 g/dL (ref 12.0–15.0)
Hemoglobin: 13 g/dL (ref 12.0–15.0)
MCH: 30 pg (ref 26.0–34.0)
MCH: 30 pg (ref 26.0–34.0)
MCHC: 34.9 g/dL (ref 30.0–36.0)
MCV: 85.7 fL (ref 78.0–100.0)
Platelets: 252 10*3/uL (ref 150–400)
RBC: 4.3 MIL/uL (ref 3.87–5.11)
RBC: 4.33 MIL/uL (ref 3.87–5.11)
RBC: 4.33 MIL/uL (ref 3.87–5.11)
WBC: 8.1 10*3/uL (ref 4.0–10.5)

## 2010-08-20 LAB — URINE CULTURE: Colony Count: NO GROWTH

## 2010-08-20 LAB — CK TOTAL AND CKMB (NOT AT ARMC)
CK, MB: 1 ng/mL (ref 0.3–4.0)
Total CK: 101 U/L (ref 7–177)

## 2010-08-20 LAB — URINALYSIS, ROUTINE W REFLEX MICROSCOPIC
Glucose, UA: NEGATIVE mg/dL
Hgb urine dipstick: NEGATIVE
Protein, ur: NEGATIVE mg/dL
pH: 8 (ref 5.0–8.0)

## 2010-08-20 LAB — D-DIMER, QUANTITATIVE: D-Dimer, Quant: 0.3 ug/mL-FEU (ref 0.00–0.48)

## 2010-08-20 LAB — TSH: TSH: 25.413 u[IU]/mL — ABNORMAL HIGH (ref 0.350–4.500)

## 2010-08-20 LAB — APTT: aPTT: 25 seconds (ref 24–37)

## 2010-08-20 NOTE — Consult Note (Signed)
[name], Rowe NO.:  [id number]  MEDICAL RECORD NO.:  [id number]           PATIENT TYPE:  LOCATION:                                 FACILITY:  PHYSICIAN:  Rollene Rotunda, MD, FACCDATE OF BIRTH:  1961-04-07  DATE OF CONSULTATION: DATE OF DISCHARGE:                                CONSULTATION   PRIMARY CARDIOLOGIST:  [name], MD.  PRIMARY CARE PHYSICIAN:  Nurse practitioner, [facility].  REASON FOR CONSULTATION:  Chest discomfort.  HISTORY OF PRESENT ILLNESS:  [name] Rowe is a 50 year old Hispanic female with no known history of coronary artery disease (cardiac cath in 2009 without CAD but vasospasm of RCA), and normal Lexiscan Myoview in November of last year, known hypertension with fibromuscular dysplasia of the renal arteries status post stent to the right renal artery in 2009, hyperlipidemia, fibromyalgia, hypothyroidism, and depression, who presents with recurrence of her chronic chest discomfort more severe than usual with some changes in character noted yesterday after taking Imdur and having a hotdog.  The patient was in her usual state of health which unfortunately lately involves chronic chest discomfort as well as tachy palpitations with activity, shortness of breath with chest discomfort, as well as chronic mild lightheadedness with some recent falls that are not always associated with lightheadedness but the sensation of someone pulling back her head as well as a depressed mood, decreased p.o. intake, and minimal activity.  Recently, she saw her primary care physician who started her on Imdur 30 mg daily and she took this for the first time and had a hotdog.  Approximately 1 hour after this, she felt slow stabbing feeling in her left chest that was pressure like and some mild shortness of breath and also the sensation that her neck was swollen. The sensation that her neck was swollen was different and continued from more  than her usual symptoms last.  She became concerned enough to present to [facility] ED for more evaluation.  Here at [facility], she has had two EKGs without acute changes.  One set of point-of-care markers and two full sets of enzymes were negative.  Labs unremarkable otherwise.  Vital signs significant for mild hypertension with systolic of 150.  Telemetry reviewed and no significant findings (normal sinus rhythm).  The patient continues to have intermittent chest discomfort similar to the symptoms that brought her in.  During the interview, she also describes some chronic lightheadedness and recent falls but no true syncope.  She also has tachy palpitations with minimal activity but admits that she has been very active recently secondary to her depressive symptoms.  Note, the patient reports not discussing these with her primary care physician, although most recent note available in e-chart from December 2011 does mention these symptoms and list fluoxetine as one of her medications.  One lab value does stand out which is TSH of 10.1.  PAST MEDICAL HISTORY: 1. Chronic chest pain.     a.     Cardiac catheterization in 2009, in [place], [place]      (no coronary artery disease but vasospasm in right coronary      artery).  b.     Lexiscan Myoview in November 2011, no evidence of ischemia,      normal ejection fraction.     c.     Per last cardiology office note, thought to be question      vasospasm, question microvascular disease. 2. Hypertension. 3. Fibromuscular dysplasia of renal artery.     a.     Stent to right renal artery, 2009. 4. Hyperlipidemia. 5. Fibromyalgia. 6. Migraine headaches. 7. Hypothyroidism. 8. Depression. 9. History of back surgery.  SOCIAL HISTORY:  The patient lives in [place], having moved here from [place] approximately 4 months ago.  She lives with her husband.  She has no tobacco, EtOH, or illicit drug use history.  No herbal meds  or regular diet.  No regular exercise.  FAMILY HISTORY:  Brother had a myocardial infarction at age 46 and then died after CVA at age 70.  Father with MI at age 48.  Mother with MI at age 67.  REVIEW OF SYSTEMS:  Please see HPI.  Notes no orthopnea, no PND, no lower extremity edema.  No coughing, wheezing, fevers or chills recently.  All other systems reviewed and were negative.  CODE STATUS:  Full.  ALLERGIES AND INTOLERANCES: 1. PROCHLORPERAZINE/COMPAZINE (seizure). 2. SUMATRIPTAN/IMITREX (heaviness in chest).  MEDICATIONS: 1. Norvasc 10 mg p.o. daily. 2. Enteric-coated aspirin 81 mg p.o. daily. 3. Atenolol 100 mg p.o. daily. 4. Lovenox 40 mg subcu injection daily. 5. Imdur 30 mg p.o. daily. 6. Levothyroxine 225 mcg p.o. daily. 7. Protonix 40 mg daily. 8. Simvastatin 20 mg p.o. at bedtime. 9. P.r.n. medications.  PHYSICAL EXAMINATION:  VITAL SIGNS:  Temperature 98.5 degrees Fahrenheit, BP 150/80, pulse 82, respiration rate 28, O2 saturation 99% on 2 L by nasal cannula. GENERAL:  The patient is alert and oriented x3.  She is in no apparent distress.  She is able to speak easily in full sentences and move during exam without any respiratory distress.  She has somewhat flat affect. HEENT:  head is normocephalic, atraumatic.  Pupils equal, round, reactive to light.  Extraocular muscles are intact.  Nares are patent with no discharge.  Dentition is good.  Oropharynx without erythema or exudates. NECK:  Supple without lymphadenopathy.  No thyromegaly.  No bruit.  No JVD. HEART:  Rate is regular with audible S1 and S2 and no clicks, rubs, murmurs, or gallops, and pulses are 2+ and equal in both upper and lower extremities bilaterally. LUNGS:  Clear to auscultation bilaterally. SKIN:  No rashes or lesions. ABDOMEN:  Soft, nontender and nondistended.  Normal abdominal bowel sounds.  No rebound or guarding.  No hepatosplenomegaly.  The patient is overweight. EXTREMITIES:   Without clubbing, cyanosis, or edema. MUSCULOSKELETAL:  Without joint deformity or effusions.  No spinal or CVA tenderness.  Chest nontender. NEUROLOGIC:  Cranial nerves II through XII grossly intact with strength 5/5 in all extremities and axial groups.  Normal sensation throughout and normal cerebellar function (as assessed in hospital bed only).  RADIOLOGY:  Chest x-ray showed no acute abnormalities.  EKG, NSR, 70 bpm, T-wave inversion in V1 and V2, no significant Q-waves. Normal axis.  No evidence of hypertrophy.  PR 182, QRS 84, QTc 449. Only change from tracing from December 2011 is T-wave inversion in V2.  LABORATORY DATA:  WBC is 6.7, HGB 11.7, HCT 33.2, PLT count is 283,000. Sodium 139, potassium 3.9, chloride 104, bicarb 27, BUN 10, creatinine 1.01, glucose 81.  Liver function tests within normal limits.  TSH  10.1. Point-of-care markers negative x1 and full sets of enzymes negative x2. Protime 13.5, INR 1.01, calcium 8.7.  Total cholesterol 163, triglycerides 99, HDL 32, LDL 111.  Urinalysis within normal limits.  ASSESSMENT AND PLAN:  The patient initially seen by Jarrett Ables, PA- C, and then seen and thoroughly assessed by attending cardiologist, Dr. Rollene Rotunda.  [name] Rowe is a 50 year old Hispanic female with no coronary artery disease on cardiac catheterization in 2009, in [place], [place], but vasospasm of the right coronary artery and history significant for hypertension, hyperlipidemia, fibromuscular dysplasia of the renal arteries having had stent placed in the right renal artery in 2009, fibromyalgia, hypothyroidism, and depression, who now presents with recurrence of her typical chest discomfort with some changes and also complaints of falls that may or may not be related to mild lightheadedness and tachy palpitations with activity in the setting of significant decreased activity recently secondary to depressive symptoms.  Chest pain - at this  point the pre-test probability of obstructive CAD is very low.  Doubt any cardiovascular etiology to her symptoms.  No further cardiac study indicated.  We will arrange for early follow up with her primary cardiologist.  I have advised the patient to ambulate in order to try to bring on symptoms similar to what she describes leading up to her episodes of falling while she is on telemetry.  Thanks for the consult.     Jarrett Ables, PAC   ______________________________ Rollene Rotunda, MD, [facility]    MS/MEDQ  D:  07/17/2010  T:  07/18/2010  Job:  [id number]  Electronically Signed by Jarrett Ables PAC on 07/20/2010 11:25:15 AM Electronically Signed by Rollene Rotunda MD [facility] on 08/20/2010 07:51:17 PM

## 2010-09-17 ENCOUNTER — Encounter: Payer: Self-pay | Admitting: Cardiology

## 2010-10-21 ENCOUNTER — Encounter: Payer: Self-pay | Admitting: Cardiology

## 2010-10-26 ENCOUNTER — Encounter: Payer: Self-pay | Admitting: Cardiology

## 2010-10-26 ENCOUNTER — Ambulatory Visit (INDEPENDENT_AMBULATORY_CARE_PROVIDER_SITE_OTHER): Payer: Medicaid Other | Admitting: Cardiology

## 2010-10-26 DIAGNOSIS — I10 Essential (primary) hypertension: Secondary | ICD-10-CM

## 2010-10-26 DIAGNOSIS — R000 Tachycardia, unspecified: Secondary | ICD-10-CM

## 2010-10-26 DIAGNOSIS — R079 Chest pain, unspecified: Secondary | ICD-10-CM

## 2010-10-26 DIAGNOSIS — R002 Palpitations: Secondary | ICD-10-CM

## 2010-10-26 NOTE — Progress Notes (Signed)
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  Patient was admitted to the hospital in [place] in 2009 with severe chest pain.  At that time, she had a left heart cath showing no angiographic coronary disease but the catheter did induce RCA spasm, leading to concern that her chest pain might be coronary vasospasm.  She also had HTN found to be probably related to fibromuscular dysplasia of the renal arteries.  She had a stent placed in the right renal artery in 2009.  Here in [place], she had a negative Lexiscan myoview in 11/11 and renal artery dopplers in 12/11 were suggestive of fibromuscular dysplasia with no significant stenosis.   She has a long history of chronic atypical chest pain.  This pattern continues.  She was admitted in 2/12 with atypical chest pain and ruled out overnight.  She was discharged home.  Blood pressure has been up and down, getting as high as the 160s systolic and as low as 88/50.  She is lightheaded at times if she stands up.  No recent syncope.  She gets occasional tachypalpitations (every other day or so).  Heart rate has been up to the 140s at times.  She is getting oxycodone for low back pain.   Labs (10/11): LDL 136, HDL 44, TGs 159, creatinine 1.06, K 3.8 Labs (11/11): LDL 101, HDL 49, LFTs normal, K 4.8, creatinine 0.83  Allergies (verified):  1)  ! Compazine 2)  ! Imitrex  Past Medical History: 1. Migraine headaches 2. Chest pain: No angiographic coronary disease on cath in 2009 in [place].  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  Suspect coronary vasospasm versus microvascular obstruction.  Lexiscan myoview (11/11): EF 79%, normal perfusion.  3. HTN: Likely related to renal artery stenosis.  4. 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  She had a renal artery stent in 2009.  Renal artery dopplers (12/11)  suggestive of fibromuscular dysplasia but no significant stenosis.  5. Hyperlipidemia 6. Fibromyalgia 7. Echo (10/11): EF 60-65%, grade I diastolic dysfunction     Family History: Mother with MI at 67, Father with MI at 59, brother with MI at 77 and died with stroke at 48  Social History: Moved to [place] from [place] with her husband.  Denies smoking cigarettes or using illegal drugs Alcohol Use - no Drug Use - no  Review of Systems        All systems reviewed and negative except as per HPI.   Current Outpatient Prescriptions  Medication Sig Dispense Refill  . ALPRAZolam (XANAX) 0.5 MG tablet Take 0.5 mg by mouth at bedtime as needed.        Marland Kitchen amLODipine (NORVASC) 5 MG tablet Take 10 mg by mouth daily.       Marland Kitchen aspirin 81 MG tablet Take 81 mg by mouth 2 (two) times daily.       Marland Kitchen atenolol (TENORMIN) 100 MG tablet Take 100 mg by mouth daily.        . butalbital-aspirin-caffeine (FIORINAL) 50-325-40 MG per capsule Take 1 capsule by mouth 2 (two) times daily as needed.        . cloNIDine (CATAPRES) 0.1 MG tablet Take 0.1 mg by mouth 2 (two) times daily.        . DULoxetine (CYMBALTA) 30 MG capsule Take 30 mg by mouth 2 (two) times daily.        Marland Kitchen  ergocalciferol (VITAMIN D2) 50000 UNITS capsule Take 50,000 Units by mouth once a week.        . levothyroxine (SYNTHROID, LEVOTHROID) 200 MCG tablet Take 200 mcg by mouth daily.        Marland Kitchen levothyroxine (SYNTHROID, LEVOTHROID) 25 MCG tablet Take 25 mcg by mouth daily.        Marland Kitchen lubiprostone (AMITIZA) 24 MCG capsule Take 24 mcg by mouth 2 (two) times daily with a meal.        . nitroGLYCERIN (NITROSTAT) 0.4 MG SL tablet Place 0.4 mg under the tongue every 5 (five) minutes as needed.        . pravastatin (PRAVACHOL) 40 MG tablet Take 40 mg by mouth daily.        . traZODone (DESYREL) 150 MG tablet Take 150 mg by mouth at bedtime.        Marland Kitchen DISCONTD: FLUoxetine (PROZAC) 20 MG capsule Take 20 mg by mouth daily.        Marland Kitchen DISCONTD: nebivolol  (BYSTOLIC) 10 MG tablet Take 10 mg by mouth daily.          BP 134/86  Pulse 98  Ht 5\' 3"  (1.6 m)  Wt 137 lb (62.143 kg)  BMI 24.27 kg/m2 General:  Well developed, well nourished, in no acute distress. Neck:  Neck supple, no JVD. No masses, thyromegaly or abnormal cervical nodes. Lungs:  Clear bilaterally to auscultation and percussion. Heart:  Non-displaced PMI, chest non-tender; regular rate and rhythm, S1, S2 without murmurs, rubs or gallops. Carotid upstroke normal, no bruit.  Pedals normal pulses. No edema, no varicosities. Abdomen:  Bowel sounds positive; abdomen soft and non-tender without masses, organomegaly, or hernias noted. No hepatosplenomegaly. Extremities:  No clubbing or cyanosis. Neurologic:  Alert and oriented x 3. Psych:  Normal affect.

## 2010-10-26 NOTE — Assessment & Plan Note (Signed)
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  Chest pain with exertion certainly is seen with microvascular disease and she is the right demographic.  She had a cath with no angiographic coronary disease in 2009 and [place] last month showed no evidence for ischemia or infarction.  She continues to have some chest pain.  She is on amlodipine that should help with vasospasm if this is part of the etiology of her chest pain.

## 2010-10-26 NOTE — Assessment & Plan Note (Signed)
Blood pressure fluctuates considerably.  She has fibromuscular dysplasia but last renal artery dopplers did not show evidence for significant stenosis.  She will continue current blood pressure medications.

## 2010-10-26 NOTE — Assessment & Plan Note (Signed)
Tachypalpitations.  HR gets up to the 140s at times.  I will get a holter monitor (48 hour) to look for any significant arrhythmias.

## 2010-10-26 NOTE — Patient Instructions (Signed)
Your physician recommends that you schedule a follow-up appointment in: 6 months with Dr. Shirlee Latch Your physician has recommended that you wear a holter monitor. Holter monitors are medical devices that record the heart's electrical activity. Doctors most often use these monitors to diagnose arrhythmias. Arrhythmias are problems with the speed or rhythm of the heartbeat. The monitor is a small, portable device. You can wear one while you do your normal daily activities. This is usually used to diagnose what is causing palpitations/syncope (passing out).

## 2010-10-27 ENCOUNTER — Encounter (INDEPENDENT_AMBULATORY_CARE_PROVIDER_SITE_OTHER): Payer: Medicaid Other

## 2010-10-27 DIAGNOSIS — R002 Palpitations: Secondary | ICD-10-CM

## 2010-11-04 ENCOUNTER — Emergency Department (HOSPITAL_COMMUNITY)
Admission: EM | Admit: 2010-11-04 | Discharge: 2010-11-04 | Disposition: A | Discharge status: Home / Self Care | Payer: Medicaid Other | Attending: Emergency Medicine | Admitting: Emergency Medicine

## 2010-11-04 ENCOUNTER — Emergency Department (HOSPITAL_COMMUNITY): Payer: Medicaid Other

## 2010-11-04 DIAGNOSIS — Z8679 Personal history of other diseases of the circulatory system: Secondary | ICD-10-CM | POA: Insufficient documentation

## 2010-11-04 DIAGNOSIS — E7800 Pure hypercholesterolemia, unspecified: Secondary | ICD-10-CM | POA: Insufficient documentation

## 2010-11-04 DIAGNOSIS — R209 Unspecified disturbances of skin sensation: Secondary | ICD-10-CM | POA: Insufficient documentation

## 2010-11-04 DIAGNOSIS — M545 Low back pain, unspecified: Secondary | ICD-10-CM | POA: Insufficient documentation

## 2010-11-04 DIAGNOSIS — I10 Essential (primary) hypertension: Secondary | ICD-10-CM | POA: Insufficient documentation

## 2010-11-04 DIAGNOSIS — IMO0001 Reserved for inherently not codable concepts without codable children: Secondary | ICD-10-CM | POA: Insufficient documentation

## 2010-11-04 DIAGNOSIS — I252 Old myocardial infarction: Secondary | ICD-10-CM | POA: Insufficient documentation

## 2010-11-04 DIAGNOSIS — I2510 Atherosclerotic heart disease of native coronary artery without angina pectoris: Secondary | ICD-10-CM | POA: Insufficient documentation

## 2010-11-04 DIAGNOSIS — M79609 Pain in unspecified limb: Secondary | ICD-10-CM | POA: Insufficient documentation

## 2010-11-04 LAB — URINALYSIS, ROUTINE W REFLEX MICROSCOPIC
Glucose, UA: NEGATIVE mg/dL
Ketones, ur: NEGATIVE mg/dL
Nitrite: NEGATIVE
Protein, ur: NEGATIVE mg/dL

## 2010-11-04 LAB — POCT I-STAT, CHEM 8
BUN: 13 mg/dL (ref 6–23)
Calcium, Ion: 1.16 mmol/L (ref 1.12–1.32)
Chloride: 105 mEq/L (ref 96–112)
HCT: 43 % (ref 36.0–46.0)
Sodium: 140 mEq/L (ref 135–145)
TCO2: 27 mmol/L (ref 0–100)

## 2010-11-11 ENCOUNTER — Ambulatory Visit: Payer: Medicaid Other | Attending: Internal Medicine | Admitting: Unknown Physician Specialty

## 2010-11-11 DIAGNOSIS — H905 Unspecified sensorineural hearing loss: Secondary | ICD-10-CM | POA: Insufficient documentation

## 2010-11-18 ENCOUNTER — Telehealth: Payer: Self-pay | Admitting: *Deleted

## 2010-11-18 NOTE — Telephone Encounter (Signed)
11/17/10 Dr Shirlee Latch reviewed monitor done 10/27/10--NSR with occ sinus tachy-rare PACs, no sig arrhythmia--pt aware of reslts by telephone. Original monitor report returned  to [facility]

## 2011-01-09 ENCOUNTER — Emergency Department (HOSPITAL_COMMUNITY)
Admission: EM | Admit: 2011-01-09 | Discharge: 2011-01-10 | Disposition: A | Discharge status: Home / Self Care | Payer: Medicaid Other | Attending: Emergency Medicine | Admitting: Emergency Medicine

## 2011-01-09 DIAGNOSIS — I2510 Atherosclerotic heart disease of native coronary artery without angina pectoris: Secondary | ICD-10-CM | POA: Insufficient documentation

## 2011-01-09 DIAGNOSIS — R079 Chest pain, unspecified: Secondary | ICD-10-CM | POA: Insufficient documentation

## 2011-01-09 DIAGNOSIS — G8929 Other chronic pain: Secondary | ICD-10-CM | POA: Insufficient documentation

## 2011-01-09 DIAGNOSIS — E7800 Pure hypercholesterolemia, unspecified: Secondary | ICD-10-CM | POA: Insufficient documentation

## 2011-01-09 DIAGNOSIS — I10 Essential (primary) hypertension: Secondary | ICD-10-CM | POA: Insufficient documentation

## 2011-01-09 DIAGNOSIS — M549 Dorsalgia, unspecified: Secondary | ICD-10-CM | POA: Insufficient documentation

## 2011-01-09 DIAGNOSIS — F341 Dysthymic disorder: Secondary | ICD-10-CM | POA: Insufficient documentation

## 2011-01-09 DIAGNOSIS — Z7982 Long term (current) use of aspirin: Secondary | ICD-10-CM | POA: Insufficient documentation

## 2011-01-09 DIAGNOSIS — I252 Old myocardial infarction: Secondary | ICD-10-CM | POA: Insufficient documentation

## 2011-01-09 DIAGNOSIS — Z79899 Other long term (current) drug therapy: Secondary | ICD-10-CM | POA: Insufficient documentation

## 2011-01-09 DIAGNOSIS — Z8673 Personal history of transient ischemic attack (TIA), and cerebral infarction without residual deficits: Secondary | ICD-10-CM | POA: Insufficient documentation

## 2011-01-10 ENCOUNTER — Emergency Department (HOSPITAL_COMMUNITY): Payer: Medicaid Other

## 2011-01-10 LAB — TROPONIN I
Troponin I: <0.3 ng/mL (ref <0.30)
Troponin I: <0.3 ng/mL (ref <0.30)

## 2011-01-10 LAB — DIFFERENTIAL
Basophils Absolute: 0 10*3/uL (ref 0.0–0.1)
Basophils Relative: 0 % (ref 0–1)
Eosinophils Absolute: 0.1 10*3/uL (ref 0.0–0.7)
Eosinophils Relative: 2 % (ref 0–5)
Lymphs Abs: 2.4 10*3/uL (ref 0.7–4.0)
Neutrophils Relative %: 61 % (ref 43–77)

## 2011-01-10 LAB — CBC
Platelets: 299 10*3/uL (ref 150–400)
RBC: 4.73 MIL/uL (ref 3.87–5.11)
RDW: 12.1 % (ref 11.5–15.5)
WBC: 8.2 10*3/uL (ref 4.0–10.5)

## 2011-01-10 LAB — COMPREHENSIVE METABOLIC PANEL
ALT: 13 U/L (ref 0–35)
AST: 14 U/L (ref 0–37)
Calcium: 9.3 mg/dL (ref 8.4–10.5)
Creatinine, Ser: 0.93 mg/dL (ref 0.50–1.10)
GFR calc Af Amer: >60 mL/min (ref >60)
Sodium: 139 mEq/L (ref 135–145)
Total Protein: 7.2 g/dL (ref 6.0–8.3)

## 2011-01-10 LAB — CK TOTAL AND CKMB (NOT AT ARMC)
CK, MB: 1.5 ng/mL (ref 0.3–4.0)
Relative Index: INVALID (ref 0.0–2.5)
Total CK: 98 U/L (ref 7–177)

## 2011-02-17 ENCOUNTER — Encounter
Payer: Medicaid Other | Attending: Physical Medicine and Rehabilitation | Admitting: Physical Medicine and Rehabilitation

## 2011-02-17 ENCOUNTER — Other Ambulatory Visit: Payer: Self-pay | Admitting: Physical Medicine and Rehabilitation

## 2011-02-17 ENCOUNTER — Ambulatory Visit (HOSPITAL_COMMUNITY)
Admission: RE | Admit: 2011-02-17 | Discharge: 2011-02-17 | Disposition: A | Discharge status: Home / Self Care | Payer: Medicaid Other | Source: Ambulatory Visit | Attending: Physical Medicine and Rehabilitation | Admitting: Physical Medicine and Rehabilitation

## 2011-02-17 DIAGNOSIS — R52 Pain, unspecified: Secondary | ICD-10-CM

## 2011-02-17 DIAGNOSIS — R269 Unspecified abnormalities of gait and mobility: Secondary | ICD-10-CM | POA: Insufficient documentation

## 2011-02-17 DIAGNOSIS — M545 Low back pain, unspecified: Secondary | ICD-10-CM | POA: Insufficient documentation

## 2011-02-17 DIAGNOSIS — I10 Essential (primary) hypertension: Secondary | ICD-10-CM | POA: Insufficient documentation

## 2011-02-17 DIAGNOSIS — I69998 Other sequelae following unspecified cerebrovascular disease: Secondary | ICD-10-CM | POA: Insufficient documentation

## 2011-02-17 DIAGNOSIS — G43909 Migraine, unspecified, not intractable, without status migrainosus: Secondary | ICD-10-CM | POA: Insufficient documentation

## 2011-02-17 DIAGNOSIS — M546 Pain in thoracic spine: Secondary | ICD-10-CM | POA: Insufficient documentation

## 2011-02-17 DIAGNOSIS — M79609 Pain in unspecified limb: Secondary | ICD-10-CM

## 2011-02-17 DIAGNOSIS — M25559 Pain in unspecified hip: Secondary | ICD-10-CM

## 2011-02-17 DIAGNOSIS — I701 Atherosclerosis of renal artery: Secondary | ICD-10-CM | POA: Insufficient documentation

## 2011-02-17 DIAGNOSIS — E785 Hyperlipidemia, unspecified: Secondary | ICD-10-CM | POA: Insufficient documentation

## 2011-02-17 DIAGNOSIS — M47814 Spondylosis without myelopathy or radiculopathy, thoracic region: Secondary | ICD-10-CM | POA: Insufficient documentation

## 2011-02-17 DIAGNOSIS — G8929 Other chronic pain: Secondary | ICD-10-CM | POA: Insufficient documentation

## 2011-02-17 DIAGNOSIS — M76899 Other specified enthesopathies of unspecified lower limb, excluding foot: Secondary | ICD-10-CM | POA: Insufficient documentation

## 2011-02-17 DIAGNOSIS — M629 Disorder of muscle, unspecified: Secondary | ICD-10-CM | POA: Insufficient documentation

## 2011-02-17 DIAGNOSIS — M2420 Disorder of ligament, unspecified site: Secondary | ICD-10-CM | POA: Insufficient documentation

## 2011-02-17 DIAGNOSIS — H9190 Unspecified hearing loss, unspecified ear: Secondary | ICD-10-CM | POA: Insufficient documentation

## 2011-02-17 DIAGNOSIS — E039 Hypothyroidism, unspecified: Secondary | ICD-10-CM | POA: Insufficient documentation

## 2011-02-18 NOTE — Progress Notes (Signed)
[name] is a patient of Dr. Della Goo.  [facility] to [facility] for chronic pain complaints related to her low back and her left leg.  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  She subsequently has undergone back surgery twice now.  [place] of [place] by Dr. Jackquline Bosch.  I believe the initial surgery may have been a laminectomy, this was on November 19, 2008.  She underwent a second surgery, posterior interbody fusion in May 2011.  She reports approximately 3-4 months of pain relief with this surgery, but then her pain came back.  She also relates a history of previous hip surgery when she was in [place] in 1988. I do not have any details regarding this surgery and she really does not know what they did, but both hips were operated on at that time.  She has been treated for her pain by Dr. Lovell Sheehan with oxycodone.  She believes she has been on maybe a Duragesic patch, however, I am not entirely sure and both of these pain medications caused her to have some dizziness as well as nausea and she has stopped taking them.  Her average pain is about a 9 on a scale of 10.  She describes it as sharp. It is localized to the low back radiating to the left posterior hip through the left groin and down the left leg to approximately the knee. Pain is worse with any type of movement, walking, bending, sitting, and standing.  She gets very little relief with anything at this point. Pain is constant throughout the day as well as night.  FUNCTIONAL STATUS:  She can walk about 10 minutes at a time.  She is able to climb stairs.  She does not drive.  She is currently not employed.  She is independent with feeding, dressing,  toileting, and meal prep and needs occasional help with bathing.  REVIEW OF SYSTEMS:  She has some constipation, but she does not have lack of bowel or bladder control.  She admits to some weakness in the left leg as well as some numbness particularly in the left thigh, occasional spasms, dizziness, depression, and anxiety.  She did check the suicide box on the health and history form.  I asked her about this. She says she does not have a plan at this time.  She says sometimes the pain is overwhelming for her.  She tells me that she has a 73-55-month-old grandson and she really would not do anything to harm herself, but she does occasionally think about it.  I have asked her if she would like further followup regarding these thoughts and she declines this currently.  Review of systems is otherwise negative except for some constipation.  PAST MEDICAL HISTORY:  Positive for: 1. History of coronary artery spasm. 2. History of cardiac catheterization in 2009 without coronary artery     disease, but positive for induced spasms. 3. Hypertension. 4. Renal artery stenosis, status post stent. 5. History of migraine. 6. History of hypothyroidism. 7. Hyperlipidemia. 8. Depression. 9. 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  SOCIAL HISTORY:  She is married.  She lives with her husband.  There are no other family members in the home at this time.  She denies illegal substance use.  She denies alcohol use.  She denies smoking.  FAMILY HISTORY:  Remarkable for a brother who died at age 43 of a stroke.  Mother died at age 87, heart attack.  Father died at age 41, heart attack.  PHYSICAL EXAMINATION:  Today, her blood pressure is 175/114, rechecked two more times today manually, 158/110 and at 9:40 160/108.  Pulse 80, respirations 18, and 99% saturated on room air.  She is without headache or symptoms, however, primary care, Dr. Lovell Sheehan was notified  and she was requested to follow up in their office after her office visit with [place] today.  She is a well-developed, well-nourished woman who does not appear in any distress.  She is oriented x3.  Her speech is clear.  Affect is overall bright, alert.  She is cooperative and pleasant.  Follows commands without difficulty.  Answers my questions appropriately.  Cranial nerves and coordination are intact.  Her reflexes are brisk in both upper and lower extremities.  Hoffman sign is negative bilaterally in the upper extremities.  She does have a little bit of clonus in both lower extremities.  Her motor strength is overall quite good, although in the upper extremity, she is unable to give full effort in the lower extremities due to pain in her back when she gives effort.  Generally, below the knee her strength is in the 5/5 range.  She transitions from sitting to standing slowly.  Her gait is non antalgic.  Tandem gait and Romberg test are performed adequately.  She has very, very limited range of motion in her back, very little forward flexion or extension.  She has very limited side bending as well.  Straight leg raise is positive, contralateral straight leg raise is positive.  Pain going down back into the legs.  Internal and external rotation at the hips bilaterally also aggravates her.  Especially on the left, she reports some pain in the posterior hip with this.  She reports intact sensation in the upper extremities to pinprick and light touch.  In the lower extremities, however, she reports decreased sensation with pinprick throughout the left leg, however, proprioception and vibratory sensation are intact.  She has a verticle scar over both lateral hips.  IMPRESSION: 1. Lumbago with positive straight leg raise.  Motor strength difficult     to assess around the hip and knee due to pain with left lower     extremity numbness, status post L2-3 fusion in 2011. 2. 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 3. History of migraine. 4. History of stroke. 5. Renal artery stenosis. 6. Right iliotibial band syndrome and lateral trochanteric bursitis     with tenderness over the trochanter down the iliotibial band on     exam today. 7. Mild gait disorder, which may be multifactorial.  PLAN:  Her blood pressure was elevated today in our clinic.  A call was placed by nursing to Dr. Lovell Sheehan' office and they have requested the patient to follow up with them today after our clinic visit.  We will obtain flexion/extension radiographs of her lumbar spine as well as x-rays of bilateral hips.  I will move in the direction of MRI of the lumbar spine as well.  With respect to medications, I am considering adding gabapentin to start with to address the neuropathic pain.  There may be other treatment options depending what we find on MRI.  She is comfortable with the plan at this time.  I will see her back after the studies are completed.  She also has a urine drug screen pending.     Brantley Stage, M.D. Electronically Signed    DMK/MedQ D:  02/17/2011 13:52:14  T:  02/17/2011 14:39:28  Job #:  [id number]

## 2011-03-19 ENCOUNTER — Encounter
Payer: Medicaid Other | Attending: Physical Medicine and Rehabilitation | Admitting: Physical Medicine and Rehabilitation

## 2011-03-19 DIAGNOSIS — M76899 Other specified enthesopathies of unspecified lower limb, excluding foot: Secondary | ICD-10-CM

## 2011-03-19 DIAGNOSIS — R109 Unspecified abdominal pain: Secondary | ICD-10-CM | POA: Insufficient documentation

## 2011-03-19 DIAGNOSIS — M545 Low back pain, unspecified: Secondary | ICD-10-CM

## 2011-03-19 DIAGNOSIS — R209 Unspecified disturbances of skin sensation: Secondary | ICD-10-CM

## 2011-03-19 DIAGNOSIS — Z981 Arthrodesis status: Secondary | ICD-10-CM | POA: Insufficient documentation

## 2011-03-19 DIAGNOSIS — M79609 Pain in unspecified limb: Secondary | ICD-10-CM

## 2011-03-19 DIAGNOSIS — M25559 Pain in unspecified hip: Secondary | ICD-10-CM | POA: Insufficient documentation

## 2011-03-19 NOTE — Assessment & Plan Note (Signed)
HISTORY OF PRESENT ILLNESS:  [name] is a 50 year old married woman who is the patient of Dr. Della Goo.  She was initially seen by me on February 17, 2011.  Her chief complaint is low back pain which radiates through the left posterior hip around to the groin and down the left thigh somewhat.  Pain is worse with standing and walking.  She can walk about 10 minutes.  It improves with sitting but it is there even when she is sitting.  Her average pain is about 9 on a scale of 10.  Pain is described as sharp and occasionally dull but constant.  Sleep has been poor.  Pain is worse with activities, improves somewhat with gabapentin.  FUNCTIONAL STATUS:  She can walk 10 minutes at a time.  She has difficulty with stairs, does not drive.  She requires some assistance with dressing and bathing on occasion.  For the most part, she is independent with feeding, dressing, bathing, toileting, and meal prep.  REVIEW OF SYSTEMS:  Negative for problems controlling bowel or bladder. Admits to some left lower extremity weakness and numbness, occasional dizziness, depression, and anxiety.  Denies suicidal ideation.  She does report occasional constipation as well.  No other change in past medical, social, or family history.  PAST SURGICAL HISTORY:  She is status post an L2-L3 interbody fusion and bilateral hip surgery remotely, 1989 and 1991.  She also brings in 2 imaging studies.  The first one is dated 10-25-2008 which is a lumbar MRI with contrast showing extruded and migrated disk fragment at L2-L3 on the left extending superiorly to the L2 vertebral body level in the lateral recess and also into the left L2-L3 neural foramina with probable impingement of left L2 root.  The next study she brings in is an MRI of the head dated May 25, 2010, showing focal lesion of the anterior right corona radiata representing likely remote ischemic event, also scattered subcortical  T2 hyperintensities slightly greater than expected for age which is nonspecific but can see be seen in the setting of chronic microvascular ischemia.  FAMILY HISTORY:  Also positive for a brother who died of a stroke.  No other change in social or family history.  PHYSICAL EXAMINATION:  VITAL SIGNS:  Today, her blood pressure is 156/100, pulse 87, respirations 16, and 98% saturate on room air.  I have asked her to follow up again with her primary care doctor regarding her blood pressure.  She was sent over from our clinic last visit for evaluation for her blood pressure. GENERAL:  She is a well-developed, well-nourished woman who does not appear in any distress.  She is oriented x3. NEUROLOGIC:  Speech is clear.  Affect is bright.  She is alert, cooperative, and pleasant.  Follows commands without difficulty. Answers my questions appropriately.  Cranial nerves to coordination are intact.  Her reflexes are overall brisk.  Hoffmann sign is negative. She has a couple beats of clonus in the lower extremities.  Her strength overall is good without obvious focal deficits other than in her left hip flexor in which she reports pain with exertion.  She has some patchy numbness in the left thigh as well.  Straight leg raise is positive on the left and also positive contralateral straight leg raise is noted as well.  She transitions from sitting to standing.  Her gait is slow.  Tandem gait and Romberg tests are performed all well.  Her lumbar motion, however, is  very very limited with very little motion with forward flexion or extension are noted.  She has tenderness in the low back, paraspinal musculature.  RADIOLOGICAL DATA:  Radiographs were done at the last visit.  She has a lumbar spine which showed stable appearance of the left L2-L3 posterior interbody fusion with no abnormal motion in flexion and extension, relatively well-preserved disk spaces in the lumbar spine.  Thoracic spine  from February 17, 2011 shows some mild thoracic spondylosis without acute finding.  Bilateral hip radiographs showed no acute findings about the pelvis/bilateral hips.  I reviewed these with her today as well.  IMPRESSION AND PLAN: 1. Left posterolateral groin and left thigh pain with positive     straight leg raise and contralateral straight leg raise.  Pain is     worse with coughing and sneezing.  Her recent radiographs show no     instability, however, we will order lumbar MRI without contrast to     evaluate for possible disk herniation. 2. Lumbago status post L2-L3 lumbar fusion. 3. Brisk reflexes.  The patient brings in an MRI today of brain that     showed focal lesion of the anterior right corona radiata as well as     scattered subcortical T2 hyperintensities.  She has a history of     hypertension and a brother who died of a stroke.  I have asked her     to follow up with Primary Care to help manage her blood pressure. 4. Mild left trochanteric bursitis.  We will consider injection,     possible physical therapy/icing in the upcoming weeks.  We will     pursue workup for sciatica prior. 5. I am also going to start her on some gabapentin.  I reviewed the     risks and benefits of this medication with her.  She would like to     try it, 300 mg 1 p.o. b.i.d. x3 days titrating up to 4 times a day. 6. I will see her back after her lumbar MRI.  I have answered all her     questions.  She is comfortable with our plan at this time.     Brantley Stage, M.D. Electronically Signed    DMK/MedQ D:  03/19/2011 09:47:03  T:  03/19/2011 10:45:38  Job #:  [id number]

## 2011-03-25 ENCOUNTER — Other Ambulatory Visit: Payer: Self-pay | Admitting: Internal Medicine

## 2011-03-25 DIAGNOSIS — R55 Syncope and collapse: Secondary | ICD-10-CM

## 2011-03-26 ENCOUNTER — Ambulatory Visit
Admission: RE | Admit: 2011-03-26 | Discharge: 2011-03-26 | Disposition: A | Discharge status: Home / Self Care | Payer: Medicaid Other | Source: Ambulatory Visit | Attending: Internal Medicine | Admitting: Internal Medicine

## 2011-03-26 DIAGNOSIS — R55 Syncope and collapse: Secondary | ICD-10-CM

## 2011-04-02 ENCOUNTER — Encounter
Payer: Medicaid Other | Attending: Physical Medicine and Rehabilitation | Admitting: Physical Medicine and Rehabilitation

## 2011-04-02 ENCOUNTER — Emergency Department (HOSPITAL_COMMUNITY)
Admission: EM | Admit: 2011-04-02 | Discharge: 2011-04-03 | Disposition: A | Discharge status: Home / Self Care | Payer: Medicaid Other | Attending: Emergency Medicine | Admitting: Emergency Medicine

## 2011-04-02 ENCOUNTER — Inpatient Hospital Stay (INDEPENDENT_AMBULATORY_CARE_PROVIDER_SITE_OTHER)
Admission: RE | Admit: 2011-04-02 | Discharge: 2011-04-02 | Disposition: A | Discharge status: Home / Self Care | Payer: Medicaid Other | Source: Ambulatory Visit | Attending: Family Medicine | Admitting: Family Medicine

## 2011-04-02 DIAGNOSIS — R000 Tachycardia, unspecified: Secondary | ICD-10-CM | POA: Insufficient documentation

## 2011-04-02 DIAGNOSIS — M545 Low back pain, unspecified: Secondary | ICD-10-CM | POA: Insufficient documentation

## 2011-04-02 DIAGNOSIS — E7800 Pure hypercholesterolemia, unspecified: Secondary | ICD-10-CM | POA: Insufficient documentation

## 2011-04-02 DIAGNOSIS — M549 Dorsalgia, unspecified: Secondary | ICD-10-CM

## 2011-04-02 DIAGNOSIS — T40605A Adverse effect of unspecified narcotics, initial encounter: Secondary | ICD-10-CM | POA: Insufficient documentation

## 2011-04-02 DIAGNOSIS — I10 Essential (primary) hypertension: Secondary | ICD-10-CM | POA: Insufficient documentation

## 2011-04-02 DIAGNOSIS — L270 Generalized skin eruption due to drugs and medicaments taken internally: Secondary | ICD-10-CM | POA: Insufficient documentation

## 2011-04-02 DIAGNOSIS — G8929 Other chronic pain: Secondary | ICD-10-CM | POA: Insufficient documentation

## 2011-04-02 DIAGNOSIS — Z8679 Personal history of other diseases of the circulatory system: Secondary | ICD-10-CM | POA: Insufficient documentation

## 2011-04-02 DIAGNOSIS — M79609 Pain in unspecified limb: Secondary | ICD-10-CM

## 2011-04-02 DIAGNOSIS — I252 Old myocardial infarction: Secondary | ICD-10-CM | POA: Insufficient documentation

## 2011-04-02 DIAGNOSIS — M76899 Other specified enthesopathies of unspecified lower limb, excluding foot: Secondary | ICD-10-CM | POA: Insufficient documentation

## 2011-04-02 DIAGNOSIS — Z981 Arthrodesis status: Secondary | ICD-10-CM | POA: Insufficient documentation

## 2011-04-02 DIAGNOSIS — I2510 Atherosclerotic heart disease of native coronary artery without angina pectoris: Secondary | ICD-10-CM | POA: Insufficient documentation

## 2011-04-02 LAB — URINALYSIS, ROUTINE W REFLEX MICROSCOPIC
Bilirubin Urine: NEGATIVE
Leukocytes, UA: NEGATIVE
Nitrite: NEGATIVE
Specific Gravity, Urine: 1.009 (ref 1.005–1.030)
Urobilinogen, UA: 0.2 mg/dL (ref 0.0–1.0)
pH: 6 (ref 5.0–8.0)

## 2011-04-02 LAB — POCT I-STAT, CHEM 8
Chloride: 105 mEq/L (ref 96–112)
Glucose, Bld: 88 mg/dL (ref 70–99)
HCT: 42 % (ref 36.0–46.0)
Hemoglobin: 14.3 g/dL (ref 12.0–15.0)
Potassium: 4 mEq/L (ref 3.5–5.1)
Sodium: 139 mEq/L (ref 135–145)

## 2011-04-02 LAB — POCT URINALYSIS DIP (DEVICE)
Bilirubin Urine: NEGATIVE
Glucose, UA: NEGATIVE mg/dL
Nitrite: NEGATIVE
Specific Gravity, Urine: 1.01 (ref 1.005–1.030)

## 2011-04-02 NOTE — Assessment & Plan Note (Signed)
[name] is a pleasant married woman who is a patient of Dr. Della Goo.  She was last seen by me on March 19, 2011.  She has a 10- month history of chronic low back pain and radiating left lower extremity pain.  Her pain radiates from the hip down to the left knee. Her gait is somewhat limited.  She has been having pain in this area for approximately 10 months now.  She was initially seen by me on February 17, 2011.  This is her 6 week visit.  She has been trialed on gabapentin, however, she continues to rate her pain at about a 9 on a scale of 10, limiting her ambulation and her ability to bend.  She describes her pain as sharp and rather constant, not really related to activities.  She gets little relief with current medicines.  FUNCTIONAL STATUS:  She can walk about 10 minutes.  She has difficulty with stairs.  She does not drive.  She is independent with feeding and toileting, needs assistance sometimes with lower extremity dressing.  Denies problems controlling bowel or bladder.  Admits to weakness, especially left lower extremity, occasional dizziness, depression, anxiety.  Denies suicidal ideation.  Since our last visit, she has had CT scan of her head without contrast for headache and elevated blood pressure.  This was ordered per Dr. Lovell Sheehan.  No other changes in past medical, social, or family history.  PHYSICAL EXAMINATION:  Blood pressure is 149/102.  No headache today. Pulse is 98, respiration 18, 100% saturated on room air.  She is a well- developed, well-nourished woman, who does not appear in any distress. She is oriented x3.  Her speech is clear.  Her affect is overall bright and alert.  She does get upset talking about her leg pain and back pain. Her cranial nerves are grossly intact.  Her reflexes are overall brisk. No clonus is noted today, however, no abnormal tone is appreciated.  She has weakness in the left hip flexors, positive straight leg  raise is noted as well.  She has some sensory deficits in the left thigh.  Her gait is slow and careful.  She has significantly limited lumbar motion in all planes.  IMPRESSION: 1. Status post L2-3 lumbar fusion with new radiating pain in the left     lower extremity for approximately 10 months now.  She has been     trialed on gabapentin.  It has helped somewhat, however, her pain     scores are still quite elevated at 9 on a scale of 10.  She does     have some left lower extremity weakness with hip flexion, concern     here for possible herniation above or below the level of the     fusion.  MRI was ordered last month, however, was denied because of     inadequate trial of 6 weeks or greater of physician directed     clinical care with clinical re-evaluation. 2. Brisk reflexes.  She does have a history of an old stroke.     Hypoattenuation was noted on March 26, 2011.  Head CT of the     right corona radiata again without acute changes. 3. Left mild trochanteric bursitis.  PLAN:  She is not interested in the use of opioids to manage her pain. She is not a good candidate for use of NSAIDs due to her hypertension. She has requested to use other methods. She has had some relief with  gabapentin. I will increase gabapentin dosage.  I will reorder MRI.  I have recommended she follow up again with primary care for management of her hypertension.  [name] agrees with our current management plan.  I have answered all her questions.  I have given her prescription for gabapentin 400 mg 1 p.o. q.i.d.  I have reviewed the risks and benefits and side effects of this medication.     Brantley Stage, M.D. Electronically Signed    DMK/MedQ D:  04/02/2011 13:03:55  T:  04/02/2011 16:10:96  Job #:  [id number]

## 2011-04-06 ENCOUNTER — Emergency Department (HOSPITAL_COMMUNITY): Payer: Medicaid Other

## 2011-04-06 ENCOUNTER — Inpatient Hospital Stay (HOSPITAL_COMMUNITY)
Admission: EM | Admit: 2011-04-06 | Discharge: 2011-04-08 | DRG: 313 | Disposition: A | Discharge status: Home / Self Care | Payer: Medicaid Other | Attending: Family Medicine | Admitting: Family Medicine

## 2011-04-06 ENCOUNTER — Encounter (HOSPITAL_COMMUNITY): Payer: Self-pay | Admitting: Radiology

## 2011-04-06 DIAGNOSIS — G43909 Migraine, unspecified, not intractable, without status migrainosus: Secondary | ICD-10-CM | POA: Diagnosis present

## 2011-04-06 DIAGNOSIS — E785 Hyperlipidemia, unspecified: Secondary | ICD-10-CM | POA: Diagnosis present

## 2011-04-06 DIAGNOSIS — Z79899 Other long term (current) drug therapy: Secondary | ICD-10-CM

## 2011-04-06 DIAGNOSIS — I10 Essential (primary) hypertension: Secondary | ICD-10-CM | POA: Diagnosis present

## 2011-04-06 DIAGNOSIS — Z7982 Long term (current) use of aspirin: Secondary | ICD-10-CM

## 2011-04-06 DIAGNOSIS — Z8673 Personal history of transient ischemic attack (TIA), and cerebral infarction without residual deficits: Secondary | ICD-10-CM

## 2011-04-06 DIAGNOSIS — Z7902 Long term (current) use of antithrombotics/antiplatelets: Secondary | ICD-10-CM

## 2011-04-06 DIAGNOSIS — I252 Old myocardial infarction: Secondary | ICD-10-CM

## 2011-04-06 DIAGNOSIS — I2510 Atherosclerotic heart disease of native coronary artery without angina pectoris: Secondary | ICD-10-CM | POA: Diagnosis present

## 2011-04-06 DIAGNOSIS — Z8249 Family history of ischemic heart disease and other diseases of the circulatory system: Secondary | ICD-10-CM

## 2011-04-06 DIAGNOSIS — R072 Precordial pain: Principal | ICD-10-CM | POA: Diagnosis present

## 2011-04-06 DIAGNOSIS — E7800 Pure hypercholesterolemia, unspecified: Secondary | ICD-10-CM | POA: Diagnosis present

## 2011-04-06 DIAGNOSIS — E039 Hypothyroidism, unspecified: Secondary | ICD-10-CM | POA: Diagnosis present

## 2011-04-06 DIAGNOSIS — G8929 Other chronic pain: Secondary | ICD-10-CM | POA: Diagnosis present

## 2011-04-06 DIAGNOSIS — Z888 Allergy status to other drugs, medicaments and biological substances status: Secondary | ICD-10-CM

## 2011-04-06 HISTORY — DX: Cerebral infarction, unspecified: I63.9

## 2011-04-06 LAB — COMPREHENSIVE METABOLIC PANEL
Alkaline Phosphatase: 87 U/L (ref 39–117)
BUN: 16 mg/dL (ref 6–23)
Calcium: 9.5 mg/dL (ref 8.4–10.5)
GFR calc Af Amer: 68 mL/min — ABNORMAL LOW (ref >90)
Glucose, Bld: 99 mg/dL (ref 70–99)
Potassium: 3.9 mEq/L (ref 3.5–5.1)
Total Protein: 7 g/dL (ref 6.0–8.3)

## 2011-04-06 LAB — CBC
MCH: 29.7 pg (ref 26.0–34.0)
MCHC: 35.5 g/dL (ref 30.0–36.0)
MCV: 83.4 fL (ref 78.0–100.0)
RBC: 4.89 MIL/uL (ref 3.87–5.11)
WBC: 7.6 10*3/uL (ref 4.0–10.5)

## 2011-04-06 LAB — POCT I-STAT TROPONIN I: Troponin i, poc: 0 ng/mL (ref 0.00–0.08)

## 2011-04-06 LAB — DIFFERENTIAL
Basophils Absolute: 0 10*3/uL (ref 0.0–0.1)
Basophils Relative: 0 % (ref 0–1)
Eosinophils Absolute: 0.1 10*3/uL (ref 0.0–0.7)
Eosinophils Relative: 1 % (ref 0–5)
Neutrophils Relative %: 74 % (ref 43–77)

## 2011-04-06 LAB — POCT PREGNANCY, URINE: Preg Test, Ur: NEGATIVE

## 2011-04-07 ENCOUNTER — Other Ambulatory Visit: Payer: Self-pay | Admitting: Internal Medicine

## 2011-04-07 DIAGNOSIS — R072 Precordial pain: Secondary | ICD-10-CM

## 2011-04-07 LAB — CARDIAC PANEL(CRET KIN+CKTOT+MB+TROPI)
CK, MB: 1.7 ng/mL (ref 0.3–4.0)
Relative Index: INVALID (ref 0.0–2.5)
Total CK: 57 U/L (ref 7–177)
Total CK: 49 U/L (ref 7–177)
Troponin I: <0.3 ng/mL (ref <0.30)

## 2011-04-07 LAB — TSH: TSH: 2.344 u[IU]/mL (ref 0.350–4.500)

## 2011-04-07 LAB — LIPID PANEL
HDL: 42 mg/dL (ref >39)
Total CHOL/HDL Ratio: 4.3 RATIO

## 2011-04-07 LAB — MRSA PCR SCREENING: MRSA by PCR: NEGATIVE

## 2011-04-08 ENCOUNTER — Inpatient Hospital Stay (HOSPITAL_COMMUNITY): Payer: Medicaid Other

## 2011-04-08 LAB — BASIC METABOLIC PANEL
BUN: 13 mg/dL (ref 6–23)
CO2: 26 mEq/L (ref 19–32)
Chloride: 105 mEq/L (ref 96–112)
Creatinine, Ser: 1.11 mg/dL — ABNORMAL HIGH (ref 0.50–1.10)

## 2011-04-08 NOTE — H&P (Signed)
[name], Rowe NO.:  [id number]  MEDICAL RECORD NO.:  [id number]  LOCATION:  [facility]                         FACILITY:  [facility]  PHYSICIAN:  Houston Siren, MD           DATE OF BIRTH:  01-Jun-1961  DATE OF ADMISSION:  04/06/2011 DATE OF DISCHARGE:                             HISTORY & PHYSICAL   PRIMARY CARE PHYSICIAN:  None.  REASON FOR ADMISSION:  Chest pain.  ADVANCE DIRECTIVES:  Full code.  HISTORY OF PRESENT ILLNESS:  This is a 50 year old female with history of prior CVA, reportedly had an MI in [place], renal artery stenosis, status post right renal stent placement, migraine, hypothyroidism, hyperlipidemia, presents with substernal chest pain today.  She also has some mild headache and has been feeling malaise. She has some nausea, but no shortness of breath and had no pleuritic chest pain.  She stated she has recently had significant  stress as she will be losing her Medicaid coverage tomorrow.  She stated that she had a cardiac catheterization in 2009, but she could not elaborate.  In the emergency room, she was found to be quite hypertensive and having persistent chest pain.  Hospitalist was asked to admit the patient for rule out.  PAST MEDICAL HISTORY: 1. Coronary artery disease. 2. MI. 3. Stent in the right renal artery. 4. Hypothyroidism. 5. Prior CVA.  FAMILY HISTORY:  She has a very strong family history of coronary artery disease, as her brother died of an MI at age 37.  SOCIAL HISTORY:  She is married.  She denied tobacco, alcohol, or drug use.  She has 2 children.  She worked at [employer] previously, but is currently not working.  ALLERGIES:  DILAUDID, IMITREX, and COMPAZINE.  CURRENT MEDICATIONS:  Include Cymbalta, clonidine, atenolol, Amitiza, aspirin, Nitrostat, Norvasc, Plavix, levothyroxine, trazodone, vitamin D3.  PHYSICAL EXAMINATION:  VITAL SIGNS:  Blood pressure 172/110, respiratory rate is 16, pulse 88. GENERAL:  Exam  shows that she is alert and oriented, but is quite anxious.  She had facial symmetry and fluent speech.  Tongue is midline. Uvula elevated with phonation.  Sclerae are nonicteric.  No carotid bruit. CARDIAC:  Revealed S1, S2.  Regular.  No murmur, rub, or gallop. LUNGS:  Clear.  Palpation over the sternal junction revealed no tenderness. ABDOMEN:  Soft, nondistended, nontender.  No rebound.  EXTREMITIES:  No edema.  No calf tenderness.  Good distal pulses bilaterally.  OBJECTIVE FINDINGS:  Creatinine 1.09, white count of 7.6 thousand, hemoglobin of 14.5.  Chest x-ray is clear.  Troponin's and CPKs are negative.  EKG showed normal sinus rhythm without any acute ST-T changes.  IMPRESSION:  This is a 50 year old female with coronary artery disease status post prior MI, hypothyroidism, hypercholesterolemia presented with chest pain.  It is possible that she has acute coronary syndrome, but I am not certain.  She is certainly quite hypertensive, and this would need to be treated aggressively.  We will place her in our Step- Down unit, rule out with serial CPKs and troponin's.  I will start her on IV nitro drip along with adding Lipitor to her regimen.  We will use IV hydralazine to bring her  blood pressure down as well.  Because she has hypothyroidism, on supplement, we will check a TSH.  She is otherwise stable and will be admitted to [facility].  Consider cardiology consult tomorrow if she continues to have problems.     Houston Siren, MD     PL/MEDQ  D:  04/07/2011  T:  04/07/2011  Job:  [id number]  Electronically Signed by Houston Siren  on 04/08/2011 07:10:31 PM

## 2011-04-09 NOTE — Discharge Summary (Signed)
[name], [name] NO.:  [id number]  MEDICAL RECORD NO.:  [id number]  LOCATION:  3730                         FACILITY:  [facility]  PHYSICIAN:  Brendia Sacks, MD    DATE OF BIRTH:  02-Sep-1960  DATE OF ADMISSION:  04/06/2011 DATE OF DISCHARGE:  04/08/2011                              DISCHARGE SUMMARY   PRIMARY CARE PHYSICIAN:  Della Goo, MD  PRIMARY CARDIOLOGIST:  [name], MD  CONDITION ON DISCHARGE:  Improved.  DISCHARGE DISPOSITION:  Home.  DISCHARGE DIAGNOSES: 1. Chest pain, chronic. 2. Uncontrolled hypertension, improved. 3. Chronic migraine headaches and dizziness. 4. Chronic back pain.  HISTORY OF PRESENT ILLNESS:  This is a 50 year old woman who was admitted on April 06, 2011, with substernal chest pain and was found to be hypertensive in the emergency room.  HOSPITAL COURSE:  [name] Rowe was initially admitted to step down.  She did rule out with serial cardiac enzymes and her blood pressure improved with treatment and resumption of her chronic medications and increase of Norvasc.  She is chest pain-free at this point.  Cardiac enzymes negative.  Telemetry is unremarkable and EKG also is unremarkable.  Her chest pain is resolved and she has been evaluated in the past including with a negative nuclear stress test, in November 2011 and cardiac catheterization in 2009 by report suspicious for vasospasm, but no significant coronary disease.  I discussed the case with Dr. [name] who plans on following her up in office in the next couple of weeks if stresses continue about the continuation of her Norvasc.  I see no evidence to suggest acute coronary syndrome at this time.  This may have been related to uncontrolled hypertension.  The patient's blood pressure is improved.  Her Norvasc has been increased.  She will continue on beta-blocker therapy as well as clonidine.  Follow up with Dr. Della Goo and Dr. Shirlee Latch in  the outpatient setting.  Migraine headaches and dizziness.  Since the patient's stroke, she has had chronic problems with lightheadedness and dizziness.  CT of the head was negative.  One was repeated the following day for unclear reasons, but again this was unremarkable.  Her neurologic exam is nonfocal and I see no evidence to warrant further evaluation.  CONSULTATIONS:  None.  PROCEDURES:  None.  IMAGING: 1. Chest x-ray on April 06, 2011:  Normal chest. 2. CT of the head April 06, 2011:  Question tiny old infarct in deep     right frontal white matter.  No acute intracranial abnormalities. 3. CT of the head April 08, 2011:  Stable appearance of the brain     with a small suspected remote right frontal white matter, lacunar     infarct, but no acute intracranial findings. 4. 2D echocardiogram April 07, 2011:  Left ventricular ejection     fraction 60% to 65%.  Normal wall motion.  No regional wall motion     abnormalities.  Grade 1 diastolic dysfunction.  Compared to     previous echo of March 18, 2010, these findings appear to be     quite similar by report status.  LABORATORY STUDIES: 1. CBC unremarkable. 2. Basic metabolic panel unremarkable.  3. Cardiac enzymes were negative. 4. Lipid panel is unremarkable.  DISCHARGE INSTRUCTIONS:  The patient will be discharged home today.  DIET:  Heart-healthy diet.  ACTIVITIES:  As tolerated.  FOLLOWUP:  With Dr. [name].  The patient should call for an appointment in the next 1-2 weeks.  Follow up with Dr. Della Goo in 1-2 weeks.  DISCHARGE MEDICATIONS:  New:  Amlodipine has been increased to 10 mg p.o. daily.  Resume the following medications: 1. Aspirin 81 mg p.o. daily. 2. Atenolol 100 mg p.o. daily. 3. Clonidine 0.1 mg p.o. b.i.d. 4. Cymbalta 30 mg p.o. daily. 5. Levothyroxine 225 mcg p.o. daily. 6. Amitiza 24 mcg by mouth 3 times a week. 7. Nitroglycerin sublingual 0.4 mg under the tongue every  5 minutes as     needed for chest pain.  If no relief, call 911. 8. Plavix 75 mg p.o. daily. 9. Trazodone 150 mg p.o. at bedtime. 10.Vitamin D2 50000 units p.o. weekly on Friday.  Time reviewing the patient's record, speaking with her primary cardiologist, and coordinating discharge is 50 minutes.     Brendia Sacks, MD     DG/MEDQ  D:  04/08/2011  T:  04/08/2011  Job:  [id number]  cc:   Della Goo, M.D. [name], MD  Electronically Signed by Brendia Sacks  on 04/09/2011 06:36:07 PM

## 2011-05-03 ENCOUNTER — Encounter
Payer: Medicaid Other | Attending: Physical Medicine and Rehabilitation | Admitting: Physical Medicine and Rehabilitation

## 2011-06-29 ENCOUNTER — Emergency Department (HOSPITAL_COMMUNITY): Payer: Medicaid Other

## 2011-06-29 ENCOUNTER — Other Ambulatory Visit: Payer: Self-pay

## 2011-06-29 ENCOUNTER — Encounter (HOSPITAL_COMMUNITY): Payer: Self-pay

## 2011-06-29 ENCOUNTER — Inpatient Hospital Stay (HOSPITAL_COMMUNITY)
Admission: EM | Admit: 2011-06-29 | Discharge: 2011-07-01 | DRG: 305 | Disposition: A | Discharge status: Home / Self Care | Payer: Medicaid Other | Attending: Cardiology | Admitting: Cardiology

## 2011-06-29 DIAGNOSIS — Z79899 Other long term (current) drug therapy: Secondary | ICD-10-CM

## 2011-06-29 DIAGNOSIS — G43909 Migraine, unspecified, not intractable, without status migrainosus: Secondary | ICD-10-CM | POA: Insufficient documentation

## 2011-06-29 DIAGNOSIS — R0602 Shortness of breath: Secondary | ICD-10-CM | POA: Diagnosis present

## 2011-06-29 DIAGNOSIS — M797 Fibromyalgia: Secondary | ICD-10-CM | POA: Insufficient documentation

## 2011-06-29 DIAGNOSIS — F329 Major depressive disorder, single episode, unspecified: Secondary | ICD-10-CM | POA: Insufficient documentation

## 2011-06-29 DIAGNOSIS — Z7982 Long term (current) use of aspirin: Secondary | ICD-10-CM

## 2011-06-29 DIAGNOSIS — F32A Depression, unspecified: Secondary | ICD-10-CM | POA: Insufficient documentation

## 2011-06-29 DIAGNOSIS — IMO0001 Reserved for inherently not codable concepts without codable children: Secondary | ICD-10-CM | POA: Diagnosis present

## 2011-06-29 DIAGNOSIS — I701 Atherosclerosis of renal artery: Secondary | ICD-10-CM | POA: Insufficient documentation

## 2011-06-29 DIAGNOSIS — I6350 Cerebral infarction due to unspecified occlusion or stenosis of unspecified cerebral artery: Secondary | ICD-10-CM | POA: Insufficient documentation

## 2011-06-29 DIAGNOSIS — E785 Hyperlipidemia, unspecified: Secondary | ICD-10-CM | POA: Diagnosis present

## 2011-06-29 DIAGNOSIS — I10 Essential (primary) hypertension: Principal | ICD-10-CM | POA: Diagnosis present

## 2011-06-29 DIAGNOSIS — R112 Nausea with vomiting, unspecified: Secondary | ICD-10-CM | POA: Diagnosis present

## 2011-06-29 DIAGNOSIS — I169 Hypertensive crisis, unspecified: Secondary | ICD-10-CM | POA: Diagnosis present

## 2011-06-29 DIAGNOSIS — K5900 Constipation, unspecified: Secondary | ICD-10-CM | POA: Diagnosis present

## 2011-06-29 DIAGNOSIS — I7789 Other specified disorders of arteries and arterioles: Secondary | ICD-10-CM | POA: Diagnosis present

## 2011-06-29 DIAGNOSIS — I369 Nonrheumatic tricuspid valve disorder, unspecified: Secondary | ICD-10-CM

## 2011-06-29 DIAGNOSIS — R079 Chest pain, unspecified: Secondary | ICD-10-CM | POA: Insufficient documentation

## 2011-06-29 DIAGNOSIS — Z8673 Personal history of transient ischemic attack (TIA), and cerebral infarction without residual deficits: Secondary | ICD-10-CM

## 2011-06-29 DIAGNOSIS — F419 Anxiety disorder, unspecified: Secondary | ICD-10-CM | POA: Insufficient documentation

## 2011-06-29 DIAGNOSIS — E039 Hypothyroidism, unspecified: Secondary | ICD-10-CM | POA: Diagnosis present

## 2011-06-29 DIAGNOSIS — I201 Angina pectoris with documented spasm: Secondary | ICD-10-CM | POA: Diagnosis present

## 2011-06-29 HISTORY — DX: Angina pectoris with documented spasm: I20.1

## 2011-06-29 HISTORY — DX: Hypertensive crisis, unspecified: I16.9

## 2011-06-29 LAB — CARDIAC PANEL(CRET KIN+CKTOT+MB+TROPI)
CK, MB: 1.4 ng/mL (ref 0.3–4.0)
Relative Index: INVALID (ref 0.0–2.5)
Total CK: 71 U/L (ref 7–177)
Troponin I: <0.3 ng/mL (ref <0.30)
Troponin I: <0.3 ng/mL (ref <0.30)

## 2011-06-29 LAB — POCT I-STAT, CHEM 8
BUN: 13 mg/dL (ref 6–23)
Calcium, Ion: 1.12 mmol/L (ref 1.12–1.32)
Chloride: 106 mEq/L (ref 96–112)
HCT: 44 % (ref 36.0–46.0)
Sodium: 140 mEq/L (ref 135–145)
TCO2: 23 mmol/L (ref 0–100)

## 2011-06-29 LAB — PROTIME-INR
INR: 0.97 (ref 0.00–1.49)
INR: 1.02 (ref 0.00–1.49)
Prothrombin Time: 13.1 seconds (ref 11.6–15.2)
Prothrombin Time: 13.6 seconds (ref 11.6–15.2)

## 2011-06-29 LAB — BASIC METABOLIC PANEL
BUN: 13 mg/dL (ref 6–23)
CO2: 22 mEq/L (ref 19–32)
CO2: 26 mEq/L (ref 19–32)
Calcium: 9.6 mg/dL (ref 8.4–10.5)
Creatinine, Ser: 1.05 mg/dL (ref 0.50–1.10)
GFR calc non Af Amer: 69 mL/min — ABNORMAL LOW (ref >90)
Glucose, Bld: 104 mg/dL — ABNORMAL HIGH (ref 70–99)
Glucose, Bld: 92 mg/dL (ref 70–99)
Potassium: 4 mEq/L (ref 3.5–5.1)
Sodium: 139 mEq/L (ref 135–145)

## 2011-06-29 LAB — CBC
Hemoglobin: 15.4 g/dL — ABNORMAL HIGH (ref 12.0–15.0)
MCH: 30.8 pg (ref 26.0–34.0)
MCV: 82.9 fL (ref 78.0–100.0)
Platelets: 367 10*3/uL (ref 150–400)
Platelets: 331 10*3/uL (ref 150–400)
RBC: 5.02 MIL/uL (ref 3.87–5.11)
RDW: 11.9 % (ref 11.5–15.5)

## 2011-06-29 LAB — MRSA PCR SCREENING: MRSA by PCR: NEGATIVE

## 2011-06-29 LAB — DRUGS OF ABUSE SCREEN W/O ALC, ROUTINE URINE
Amphetamine Screen, Ur: NEGATIVE
Barbiturate Quant, Ur: NEGATIVE
Cocaine Metabolites: NEGATIVE
Creatinine,U: 143.2 mg/dL
Methadone: NEGATIVE

## 2011-06-29 LAB — LIPID PANEL
LDL Cholesterol: 156 mg/dL — ABNORMAL HIGH (ref 0–99)
Total CHOL/HDL Ratio: 4.1 RATIO
VLDL: 17 mg/dL (ref 0–40)

## 2011-06-29 LAB — DIFFERENTIAL
Basophils Relative: 0 % (ref 0–1)
Monocytes Absolute: 0.6 10*3/uL (ref 0.1–1.0)
Monocytes Relative: 7 % (ref 3–12)

## 2011-06-29 LAB — POCT I-STAT TROPONIN I

## 2011-06-29 MED ORDER — LABETALOL HCL 5 MG/ML IV SOLN
20.0000 mg | Freq: Once | INTRAVENOUS | Status: AC
  Administered 2011-06-29: 20 mg via INTRAVENOUS

## 2011-06-29 MED ORDER — CLOPIDOGREL BISULFATE 75 MG PO TABS
75.0000 mg | ORAL_TABLET | Freq: Every day | ORAL | Status: DC
Start: 2011-06-29 — End: 2011-07-01
  Administered 2011-06-29 – 2011-07-01 (×3): 75 mg via ORAL
  Filled 2011-06-29 (×3): qty 1

## 2011-06-29 MED ORDER — LABETALOL HCL 5 MG/ML IV SOLN
0.5000 mg/min | INTRAVENOUS | Status: DC
  Administered 2011-06-29 (×2): 0.5 mg/min via INTRAVENOUS
  Filled 2011-06-29 (×2): qty 100

## 2011-06-29 MED ORDER — HYDROCHLOROTHIAZIDE 25 MG PO TABS
25.0000 mg | ORAL_TABLET | Freq: Every day | ORAL | Status: DC
  Administered 2011-06-30 – 2011-07-01 (×2): 25 mg via ORAL
  Filled 2011-06-29 (×3): qty 1

## 2011-06-29 MED ORDER — ONDANSETRON HCL 4 MG/2ML IJ SOLN
4.0000 mg | Freq: Four times a day (QID) | INTRAMUSCULAR | Status: DC | PRN
  Administered 2011-06-29 – 2011-07-01 (×6): 4 mg via INTRAVENOUS
  Filled 2011-06-29 (×6): qty 2

## 2011-06-29 MED ORDER — LEVOTHYROXINE SODIUM 200 MCG PO TABS
200.0000 ug | ORAL_TABLET | Freq: Every day | ORAL | Status: DC
  Administered 2011-06-29: 200 ug via ORAL
  Filled 2011-06-29: qty 1

## 2011-06-29 MED ORDER — METOPROLOL TARTRATE 1 MG/ML IV SOLN
INTRAVENOUS | Status: AC
  Administered 2011-06-29: 5 mg via INTRAVENOUS
  Filled 2011-06-29: qty 5

## 2011-06-29 MED ORDER — VITAMIN D (ERGOCALCIFEROL) 1.25 MG (50000 UNIT) PO CAPS
50000.0000 [IU] | ORAL_CAPSULE | ORAL | Status: DC
  Filled 2011-06-29: qty 1

## 2011-06-29 MED ORDER — NIFEDIPINE ER OSMOTIC RELEASE 90 MG PO TB24
90.0000 mg | ORAL_TABLET | Freq: Every day | ORAL | Status: DC
  Administered 2011-06-29 – 2011-07-01 (×3): 90 mg via ORAL
  Filled 2011-06-29 (×3): qty 1

## 2011-06-29 MED ORDER — MORPHINE SULFATE 4 MG/ML IJ SOLN
INTRAMUSCULAR | Status: AC
  Administered 2011-06-29: 02:00:00 via INTRAVENOUS
  Filled 2011-06-29: qty 1

## 2011-06-29 MED ORDER — NITROGLYCERIN IN D5W 200-5 MCG/ML-% IV SOLN: 1.0000 ug/min | INTRAVENOUS | Status: DC

## 2011-06-29 MED ORDER — ONDANSETRON HCL 4 MG/2ML IJ SOLN
INTRAMUSCULAR | Status: AC
  Filled 2011-06-29: qty 2

## 2011-06-29 MED ORDER — MORPHINE SULFATE 2 MG/ML IJ SOLN
INTRAMUSCULAR | Status: AC
  Filled 2011-06-29: qty 1

## 2011-06-29 MED ORDER — METOPROLOL TARTRATE 1 MG/ML IV SOLN
5.0000 mg | Freq: Once | INTRAVENOUS | Status: AC
  Administered 2011-06-29: 5 mg via INTRAVENOUS

## 2011-06-29 MED ORDER — ASPIRIN 300 MG RE SUPP
300.0000 mg | RECTAL | Status: AC
  Filled 2011-06-29: qty 1

## 2011-06-29 MED ORDER — NITROGLYCERIN IN D5W 200-5 MCG/ML-% IV SOLN
5.0000 ug/min | Freq: Once | INTRAVENOUS | Status: AC
  Administered 2011-06-29: 5 ug/min via INTRAVENOUS
  Filled 2011-06-29: qty 250

## 2011-06-29 MED ORDER — LUBIPROSTONE 24 MCG PO CAPS: 24.0000 ug | ORAL_CAPSULE | ORAL | Status: DC

## 2011-06-29 MED ORDER — LEVOTHYROXINE SODIUM 125 MCG PO TABS
250.0000 ug | ORAL_TABLET | Freq: Every day | ORAL | Status: DC
  Administered 2011-06-30 – 2011-07-01 (×2): 250 ug via ORAL
  Filled 2011-06-29 (×3): qty 2

## 2011-06-29 MED ORDER — SIMVASTATIN 20 MG PO TABS
20.0000 mg | ORAL_TABLET | Freq: Every day | ORAL | Status: DC
  Administered 2011-06-29 – 2011-06-30 (×2): 20 mg via ORAL
  Filled 2011-06-29 (×3): qty 1

## 2011-06-29 MED ORDER — SODIUM CHLORIDE 0.9 % IV SOLN
INTRAVENOUS | Status: DC
  Administered 2011-06-29: 10 mL/h via INTRAVENOUS

## 2011-06-29 MED ORDER — LABETALOL HCL 5 MG/ML IV SOLN
INTRAVENOUS | Status: AC
  Filled 2011-06-29: qty 4

## 2011-06-29 MED ORDER — LUBIPROSTONE 24 MCG PO CAPS
24.0000 ug | ORAL_CAPSULE | ORAL | Status: DC
  Administered 2011-06-30: 24 ug via ORAL
  Filled 2011-06-29 (×2): qty 1

## 2011-06-29 MED ORDER — DULOXETINE HCL 30 MG PO CPEP
30.0000 mg | ORAL_CAPSULE | Freq: Every day | ORAL | Status: DC
  Administered 2011-06-29 – 2011-07-01 (×3): 30 mg via ORAL
  Filled 2011-06-29 (×3): qty 1

## 2011-06-29 MED ORDER — SODIUM CHLORIDE 0.9 % IV SOLN
Freq: Once | INTRAVENOUS | Status: AC
  Administered 2011-06-29: 01:00:00 via INTRAVENOUS

## 2011-06-29 MED ORDER — ASPIRIN 81 MG PO CHEW
324.0000 mg | CHEWABLE_TABLET | ORAL | Status: AC
  Filled 2011-06-29: qty 1

## 2011-06-29 MED ORDER — ASPIRIN 81 MG PO CHEW
81.0000 mg | CHEWABLE_TABLET | Freq: Every day | ORAL | Status: DC
  Administered 2011-06-29 – 2011-07-01 (×3): 81 mg via ORAL
  Filled 2011-06-29 (×2): qty 1

## 2011-06-29 MED ORDER — ACETAMINOPHEN 325 MG PO TABS
650.0000 mg | ORAL_TABLET | ORAL | Status: DC | PRN
  Administered 2011-06-29: 650 mg via ORAL
  Filled 2011-06-29: qty 2

## 2011-06-29 MED ORDER — MORPHINE SULFATE 2 MG/ML IJ SOLN
2.0000 mg | Freq: Once | INTRAMUSCULAR | Status: AC
  Administered 2011-06-29: 2 mg via INTRAVENOUS

## 2011-06-29 MED ORDER — HYDROCODONE-ACETAMINOPHEN 5-325 MG PO TABS
1.0000 | ORAL_TABLET | ORAL | Status: DC | PRN
  Administered 2011-06-29 – 2011-06-30 (×4): 1 via ORAL
  Filled 2011-06-29 (×4): qty 1

## 2011-06-29 MED ORDER — ATENOLOL 100 MG PO TABS
100.0000 mg | ORAL_TABLET | Freq: Two times a day (BID) | ORAL | Status: DC
  Filled 2011-06-29 (×2): qty 1

## 2011-06-29 MED ORDER — ZOLPIDEM TARTRATE 5 MG PO TABS: 5.0000 mg | ORAL_TABLET | Freq: Every evening | ORAL | Status: DC | PRN

## 2011-06-29 NOTE — ED Notes (Signed)
HR 92 BPM pt states feeling much better after Morphine . But chest pain not resolved stabing pain from chest to back EDP aware

## 2011-06-29 NOTE — Progress Notes (Signed)
*  PRELIMINARY RESULTS* Echocardiogram 2D Echocardiogram has been performed.  [name] Rowe [facility] 06/29/2011, 10:53 AM

## 2011-06-29 NOTE — Plan of Care (Signed)
Problem: Phase I Progression Outcomes Goal: Voiding-avoid urinary catheter unless indicated Outcome: Completed/Met Date Met:  06/29/11 Patient is voiding, using the BSC.

## 2011-06-29 NOTE — ED Provider Notes (Signed)
History     CSN: [id number]  Arrival date & time 06/29/11  0104   First MD Initiated Contact with Patient 06/29/11 0107      Chief Complaint  Patient presents with  . Chest Pain    Pt has been having chest pressure for four days, worse last night, pt anxious with EMS.  Pt also complaining of SOB and nausea.  Pt had total of nitro x4, ASA 324, Zofran 4mg .    (Consider location/radiation/quality/duration/timing/severity/associated sxs/prior treatment) HPI Comments: 51 year old female with a history of renal artery stenosis, hypertension, migraines, hyperlipidemia, hypothyroidism status post stroke in 2006 presents with a complaint of substernal chest pain. This was a severe heaviness that has been intermittent over the last 24 hours, became much worse this evening and was associated with nausea vomiting pain in her left shoulder and her teeth. Symptoms have improved after taking nitroglycerin by ambulance. She was noted to be hypertensive with a pressure of 230/120 on paramedic arrival. She denies associated fevers, chills, cough, diarrhea, swelling, rash, headache. Symptoms were gradual in onset, intermittent, severe at worst, improved with nitroglycerin and aspirin in route. Patient states that she has seen Dr. Jearld Pies [facility] in [facility] past, last visit was 9 months ago  The history is provided by the patient, medical records and the EMS personnel.    Past Medical History  Diagnosis Date  . Migraine headache   . Chest pain   . HTN (hypertension)   . Renal artery stenosis   . HLD (hyperlipidemia)   . Fibromyalgia   . CVA (cerebral vascular accident)     Past Surgical History  Procedure Date  . Coronary stent placement   . Spine surgery     Family History  Problem Relation Age of Onset  . Heart attack    . Stroke      History  Substance Use Topics  . Smoking status: Never Smoker   . Smokeless tobacco: Never Used  . Alcohol Use: No    OB History    Grav  Para Term Preterm Abortions TAB SAB Ect Mult Living                  Review of Systems  All other systems reviewed and are negative.    Allergies  Hydromorphone hcl; Prochlorperazine edisylate; and Sumatriptan  Home Medications   Current Outpatient Rx  Name Route Sig Dispense Refill  . AMLODIPINE BESYLATE 5 MG PO TABS Oral Take 10 mg by mouth daily.     . ASPIRIN 81 MG PO TABS Oral Take 81 mg by mouth daily.     . ATENOLOL 100 MG PO TABS Oral Take 100 mg by mouth 2 (two) times daily.     Marland Kitchen CLONIDINE HCL 0.2 MG PO TABS Oral Take 0.2 mg by mouth daily.    Marland Kitchen CLOPIDOGREL BISULFATE 75 MG PO TABS Oral Take 75 mg by mouth daily.      . DULOXETINE HCL 30 MG PO CPEP Oral Take 30 mg by mouth daily.     . ERGOCALCIFEROL 50000 UNITS PO CAPS Oral Take 50,000 Units by mouth once a week. On monday    . LEVOTHYROXINE SODIUM 200 MCG PO TABS Oral Take 200 mcg by mouth daily.     Marland Kitchen LEVOTHYROXINE SODIUM 25 MCG PO TABS Oral Take 25 mcg by mouth daily.      . LUBIPROSTONE 24 MCG PO CAPS Oral Take 24 mcg by mouth 3 (three) times a week.     Marland Kitchen  NITROGLYCERIN 0.4 MG SL SUBL Sublingual Place 0.4 mg under the tongue every 5 (five) minutes as needed. For chest pain    . ZOLPIDEM TARTRATE 5 MG PO TABS Oral Take 5-10 mg by mouth at bedtime as needed. For sleep      BP 150/106  Pulse 103  Temp(Src) 98.2 F (36.8 C) (Oral)  Resp 26  Ht 5\' 3"  (1.6 m)  Wt 131 lb (59.421 kg)  BMI 23.21 kg/m2  SpO2 100%  Physical Exam  Nursing note and vitals reviewed. Constitutional: She appears well-developed and well-nourished.       Uncomfortable appearing  HENT:  Head: Normocephalic and atraumatic.  Mouth/Throat: Oropharynx is clear and moist. No oropharyngeal exudate.  Eyes: Conjunctivae and EOM are normal. Pupils are equal, round, and reactive to light. Right eye exhibits no discharge. Left eye exhibits no discharge. No scleral icterus.  Neck: Normal range of motion. Neck supple. No JVD present. No thyromegaly  present.  Cardiovascular: Normal rate, regular rhythm, normal heart sounds and intact distal pulses.  Exam reveals no gallop and no friction rub.   No murmur heard.      Radial pulses normal bilaterally  Pulmonary/Chest: Effort normal and breath sounds normal. No respiratory distress. She has no wheezes. She has no rales.  Abdominal: Soft. Bowel sounds are normal. She exhibits no distension and no mass. There is no tenderness.  Musculoskeletal: Normal range of motion. She exhibits no edema and no tenderness.  Lymphadenopathy:    She has no cervical adenopathy.  Neurological: She is alert. Coordination normal.  Skin: Skin is warm. No rash noted. No erythema.       Diaphoretic  Psychiatric: She has a normal mood and affect. Her behavior is normal.    ED Course  Procedures (including critical care time)  Labs Reviewed  BASIC METABOLIC PANEL - Abnormal; Notable for the following:    Glucose, Bld 104 (*)    GFR calc non Af Amer 61 (*)    GFR calc Af Amer 71 (*)    All other components within normal limits  CBC - Abnormal; Notable for the following:    Hemoglobin 15.7 (*)    MCHC 37.2 (*)    All other components within normal limits  DIFFERENTIAL  APTT  PROTIME-INR  POCT I-STAT, CHEM 8  POCT I-STAT TROPONIN I  I-STAT TROPONIN I  I-STAT, CHEM 8  TROPONIN I   Dg Chest Port 1 View  06/29/2011  *RADIOLOGY REPORT*  Clinical Data: Chest pain, shortness of breath and chest pressure.  PORTABLE CHEST - 1 VIEW  Comparison: Chest radiograph performed 04/06/2011  Findings: The lungs are well-aerated and clear.  There is no evidence of focal opacification, pleural effusion or pneumothorax.  The cardiomediastinal silhouette is within normal limits.  No acute osseous abnormalities are seen.  IMPRESSION: No acute cardiopulmonary process seen.  Original Report Authenticated By: Tonia Ghent, M.D.     1. Chest pain       MDM  The patient with risk factors for cardiac disease, EKG, labs,  chest x-ray ordered, nitroglycerin drip started.  Review of the medical record shows that the patient had a nuclear stress test in November of 2011 and a cardiac catheterization in 2009. The stress test in November was reportedly negative, the catheterization was per patient report and she is unsure of the results. She was admitted in October of 2012 approximately 3 months ago and noted to have a negative cardiac evaluation including negative cardiac enzymes.  ED ECG REPORT   Date: 06/29/2011   Rate: 104  Rhythm: sinus tachycardia  QRS Axis: normal  Intervals: normal  ST/T Wave abnormalities: normal  Conduction Disutrbances:none  Narrative Interpretation:   Old EKG Reviewed: unchanged and Impaired with 04/06/2011, rate is slightly faster today.  Patient placed on a nitroglycerin drip due to her chest pain, initially stated that the pain improved however then she developed a significant tachycardia to 150. The nitroglycerin was turned off over the next 2 minutes the tachycardia resolved completely.  I have personally reviewed the labs ordered including troponin, coagulation panel, metabolic panel, CBC and chest x-ray. I have personally interpreted chest x-ray showing no acute findings and agree with radiology read as well. There are no acute findings and the blood work including troponin, renal function or electrolytes. I discussed her care with the cardiologist on call who will come to see the patient in the emergency department.  Vida Roller, MD 06/29/11 [phone]

## 2011-06-29 NOTE — Plan of Care (Signed)
Problem: Phase I Progression Outcomes Goal: Aspirin unless contraindicated Outcome: Completed/Met Date Met:  06/29/11 Patient has received aspirin.

## 2011-06-29 NOTE — Progress Notes (Addendum)
Subjective:   [name] Rowe is a 51 year old Hispanic female with a history of renal fibromuscular dysplasia, severe hypertension, syndrome X., and history of CVA. She was admitted with chest pain and significant hypertension.  She's had her renal fibromuscular dysplasia evaluated when she was in [place]. She has a right renal artery stent.  She was last seen by Dr. Shirlee Latch in May of 2012. Since that time she has done well but her primary medical doctor discontinued her amlodipine. She developed these chest pains several weeks following that.  This morning she continues to have chest pain. Blood pressure is 180/115. Her heart rate is 80. Her O2 saturations 100%.      . sodium chloride   Intravenous Once  . aspirin  324 mg Oral NOW   Or  . aspirin  300 mg Rectal NOW  . aspirin  81 mg Oral Daily  . clopidogrel  75 mg Oral Daily  . DULoxetine  30 mg Oral Daily  . hydrochlorothiazide  25 mg Oral Daily  . labetalol  20 mg Intravenous Once  . levothyroxine  200 mcg Oral Daily  . lubiprostone  24 mcg Oral 3 times weekly  . metoprolol  5 mg Intravenous Once  .  morphine injection  2 mg Intravenous Once  . morphine      . morphine      . NIFEdipine  90 mg Oral Daily  . nitroGLYCERIN  5-200 mcg/min Intravenous Once  . ondansetron      . simvastatin  20 mg Oral q1800  . Vitamin D (Ergocalciferol)  50,000 Units Oral Weekly  . DISCONTD: atenolol  100 mg Oral BID      . sodium chloride 10 mL/hr (06/29/11 0658)  . labetalol (NORMODYNE) infusion    . nitroGLYCERIN 5 mcg/min (06/29/11 0626)    Objective:  Vital Signs in the last 24 hours: Blood pressure 140/81, pulse 139, temperature 98 F (36.7 C), temperature source Oral, resp. rate 22, height 5\' 3"  (1.6 m), weight 132 lb 15 oz (60.3 kg), SpO2 100.00%. Temp:  [98 F (36.7 C)-98.2 F (36.8 C)] 98 F (36.7 C) (01/22 0640) Pulse Rate:  [75-139] 139  (01/22 0700) Resp:  [11-28] 22  (01/22 0700) BP: (140-195)/(81-117) 140/81 mmHg  (01/22 0700) SpO2:  [100 %] 100 % (01/22 0700) Weight:  [131 lb (59.421 kg)-132 lb 15 oz (60.3 kg)] 132 lb 15 oz (60.3 kg) (01/22 0640)  Intake/Output from previous day: 01/21 0701 - 01/22 0700 In: 11.2 [I.V.:11.2] Out: 250 [Emesis/NG output:250] Intake/Output from this shift:    Physical Exam: The patient is alert and oriented x 3.  The mood and affect are normal.   Skin: warm and dry.  Color is normal.    HEENT:   the sclera are nonicteric.  The mucous membranes are moist.  The carotids are 2+ without bruits.  There is no thyromegaly.  There is no JVD.    Lungs: clear.  The chest wall is non tender.    Heart: regular rate with a normal S1 and S2.  There are no murmurs, gallops, or rubs. The PMI is not displaced.     Abdomen: good bowel sounds.  There is no guarding or rebound.  There is no hepatosplenomegaly or tenderness.  There are no masses.  I did not hear any renal bruits  Extremities:  no clubbing, cyanosis, or edema.  The legs are without rashes.  The distal pulses are intact.   Neuro:  Cranial nerves II -  XII are intact.  Motor and sensory functions are intact.     Lab Results:  Basename 06/29/11 0439 06/29/11 0124 06/29/11 0113  WBC 8.4 -- 8.3  HGB 15.4* 15.0 --  PLT 331 -- 367    Basename 06/29/11 0439 06/29/11 0124 06/29/11 0113  NA 139 140 --  K 4.0 3.9 --  CL 102 106 --  CO2 26 -- 22  GLUCOSE 92 98 --  BUN 12 13 --  CREATININE 0.95 1.10 --    Basename 06/29/11 0438  TROPONINI <0.30   No results found for this basename: BNP in the last 72 hours Hepatic Function Panel No results found for this basename: PROT,ALBUMIN,AST,ALT,ALKPHOS,BILITOT,BILIDIR,IBILI in the last 72 hours Lab Results  Component Value Date   CHOL 229* 06/29/2011   HDL 56 06/29/2011   LDLCALC 156* 06/29/2011   TRIG 84 06/29/2011   CHOLHDL 4.1 06/29/2011    Basename 06/29/11 0439  INR 1.02    Telemetry: Sinus rhythm.  Assessment/Plan:    1. Hypertensive crisis (06/29/2011)   her blood pressure remains fairly elevated. We'll start her on a labetalol drip. I suspect that this may be more useful than having her on atenolol. We'll convert her to by mouth labetalol in the next day or so.  She's also on nitroglycerin drip for chest pain and blood pressure control. She has a history of fibromuscular dysplasia of her renal arteries. This is been stented. We will probably need to do further evaluation including CT angiogram versus duplex scan.   2. Chest pain:  Initial cardiac markers are negative. We'll continue to draw them every 8 hours for 2 more sets.    Disposition: We'll keep in the ICU today. We'll consider transfer to the step down unit after she is off the labetalol drip.  Vesta Mixer, Montez Hageman., MD, [facility] 06/29/2011, 7:52 AM LOS: Day 0   Addendum:  Review of labs reveal that her TSH is  Lab Results  Component Value Date   TSH 7.274* 06/29/2011   She also complains of ongoing constipation.  Will increase her synthroid to .250 a day  Will start her home dose of Amitiza ( for constipation) Zofran given for nausea.   This may be due to her constipation.   Vesta Mixer, Montez Hageman., MD, [facility] 06/29/2011, 2:39 PM

## 2011-06-29 NOTE — ED Notes (Signed)
Nitro gtt started per order pt immediately became flush in face and reported feeling like something was covering her face. Heart rate increased to 140's to 150's and pt reports she was not feeling well. Nitro gtt stopped and 12 lead EKG obtained .

## 2011-06-29 NOTE — ED Notes (Signed)
Pt reported chest pressure and tightness @ 2330 last PM felt weak SOB and chest tightness with nausea denies emesis pt called EMS who transported to ED 4 mg Zofran given enroute for nausea

## 2011-06-29 NOTE — H&P (Signed)
[name] Rowe is an 51 y.o. female with a history of syndrome X, severe HTN, renal fibromuscular dysplasia sp renal stent, Hc of CVA. Chief Complaint: Chest pain HPI: Patient with the above medical conditions states she was in her usual state of health watching TV when she developed a sudden episode of chest pain which she described as pressure (as though someone was sitting on her chest). Pain was severe in intensity, radiating to the left side of her shoulder associated with diaphoresis, palpitations, tremulousness and shortness of breath. She called 911 and they found her with severe hypertension (systolic in the 200s and diastolic above a 409). She was started on nitrodrip with some relief of BP and symptoms but the ER staff states that she became tachycardia with nitro drip so same was stopped with resolution of tachycardia. Her cardiac history is significant for severe HTN first diagnosed 17 yrs ago. She has been on multiple medications which have been changed a lot over the years. Per Dr Alford Highland notes and corroborated by patient, she had a cardiac catheterization in 2009 which was normal but during the procedure she had some catheter related spasms. She had a negative nuclear stress test in 2011. It was thought that she had syndrome X so she was started on amlodipine which was stopped a few weeks ago by a new physician she started seeing. She may have lost her insurance and is unable to go back to original family doctor so she now sees someone in a clinic which is run every 2 weeks around her home. 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 Her current meds include Clonidine which she states she takes once a day (0.2mg ), atenolol 100mg  daily, valsartan ? 160mg . No diuretic. No more amlodipine.   Past Medical History  Diagnosis Date  . Migraine headache   . Chest pain   . HTN (hypertension)   . Renal artery  stenosis   . HLD (hyperlipidemia)   . Fibromyalgia   . CVA (cerebral vascular accident)     Past Surgical History  Procedure Date  . Coronary stent placement   . Spine surgery     Family History  Problem Relation Age of Onset  . Heart attack    . Stroke     Social History:  reports that she has never smoked. She has never used smokeless tobacco. She reports that she does not drink alcohol or use illicit drugs.  Allergies:  Allergies  Allergen Reactions  . Hydromorphone Hcl Other (See Comments)    Palpitations  . Prochlorperazine Edisylate Other (See Comments)    Seizure   . Sumatriptan Palpitations    Medications Prior to Admission  Medication Dose Route Frequency Provider Last Rate Last Dose  . 0.9 %  sodium chloride infusion   Intravenous Once Vida Roller, MD 75 mL/hr at 06/29/11 0124    . acetaminophen (TYLENOL) tablet 650 mg  650 mg Oral Q4H PRN Vida Roller, MD      . aspirin chewable tablet 324 mg  324 mg Oral NOW Vida Roller, MD       Or  . aspirin suppository 300 mg  300 mg Rectal NOW Vida Roller, MD      . aspirin tablet 81 mg  81 mg Oral Daily Vida Roller, MD      . atenolol (TENORMIN) tablet 100 mg  100 mg Oral BID  Vida Roller, MD      . clopidogrel (PLAVIX) tablet 75 mg  75 mg Oral Daily Vida Roller, MD      . DULoxetine (CYMBALTA) DR capsule 30 mg  30 mg Oral Daily Vida Roller, MD      . ergocalciferol (VITAMIN D2) capsule 50,000 Units  50,000 Units Oral Weekly Vida Roller, MD      . hydrochlorothiazide (HYDRODIURIL) tablet 25 mg  25 mg Oral Daily Vida Roller, MD      . levothyroxine (SYNTHROID, LEVOTHROID) tablet 200 mcg  200 mcg Oral Daily Vida Roller, MD      . lubiprostone (AMITIZA) capsule 24 mcg  24 mcg Oral 3 times weekly Vida Roller, MD      . morphine 2 MG/ML injection 2 mg  2 mg Intravenous Once Vida Roller, MD   2 mg at 06/29/11 0300  . morphine 2 MG/ML injection           . morphine 4 MG/ML injection            . NIFEdipine (PROCARDIA XL/ADALAT-CC) 24 hr tablet 90 mg  90 mg Oral Daily Vida Roller, MD      . nitroGLYCERIN 0.2 mg/mL in dextrose 5 % infusion  5-200 mcg/min Intravenous Once Vida Roller, MD   5 mcg/min at 06/29/11 0120  . nitroGLYCERIN 0.2 mg/mL in dextrose 5 % infusion  1-30 mcg/min Intravenous Titrated Vida Roller, MD      . ondansetron [facility]) 4 MG/2ML injection           . ondansetron (ZOFRAN) injection 4 mg  4 mg Intravenous Q6H PRN Vida Roller, MD      . simvastatin (ZOCOR) tablet 20 mg  20 mg Oral q1800 Vida Roller, MD      . zolpidem [facility]) tablet 5 mg  5 mg Oral QHS PRN Vida Roller, MD       Medications Prior to Admission  Medication Sig Dispense Refill  . amLODipine (NORVASC) 5 MG tablet Take 10 mg by mouth daily.       Marland Kitchen aspirin 81 MG tablet Take 81 mg by mouth daily.       Marland Kitchen atenolol (TENORMIN) 100 MG tablet Take 100 mg by mouth 2 (two) times daily.       . clopidogrel (PLAVIX) 75 MG tablet Take 75 mg by mouth daily.        . DULoxetine (CYMBALTA) 30 MG capsule Take 30 mg by mouth daily.       . ergocalciferol (VITAMIN D2) 50000 UNITS capsule Take 50,000 Units by mouth once a week. On monday      . levothyroxine (SYNTHROID, LEVOTHROID) 200 MCG tablet Take 200 mcg by mouth daily.       Marland Kitchen levothyroxine (SYNTHROID, LEVOTHROID) 25 MCG tablet Take 25 mcg by mouth daily.        Marland Kitchen lubiprostone (AMITIZA) 24 MCG capsule Take 24 mcg by mouth 3 (three) times a week.       . nitroGLYCERIN (NITROSTAT) 0.4 MG SL tablet Place 0.4 mg under the tongue every 5 (five) minutes as needed. For chest pain        Results for orders placed during the hospital encounter of 06/29/11 (from the past 48 hour(s))  BASIC METABOLIC PANEL     Status: Abnormal   Collection Time   06/29/11  1:13 AM      Component Value Range Comment  Sodium 135  135 - 145 (mEq/L)    Potassium 4.0  3.5 - 5.1 (mEq/L)    Chloride 101  96 - 112 (mEq/L)    CO2 22  19 - 32 (mEq/L)    Glucose, Bld 104  (*) 70 - 99 (mg/dL)    BUN 13  6 - 23 (mg/dL)    Creatinine, Ser 1.61  0.50 - 1.10 (mg/dL)    Calcium 9.6  8.4 - 10.5 (mg/dL)    GFR calc non Af Amer 61 (*) >90 (mL/min)    GFR calc Af Amer 71 (*) >90 (mL/min)   CBC     Status: Abnormal   Collection Time   06/29/11  1:13 AM      Component Value Range Comment   WBC 8.3  4.0 - 10.5 (K/uL)    RBC 5.09  3.87 - 5.11 (MIL/uL)    Hemoglobin 15.7 (*) 12.0 - 15.0 (g/dL)    HCT 09.6  04.5 - 40.9 (%)    MCV 82.9  78.0 - 100.0 (fL)    MCH 30.8  26.0 - 34.0 (pg)    MCHC 37.2 (*) 30.0 - 36.0 (g/dL)    RDW 81.1  91.4 - 78.2 (%)    Platelets 367  150 - 400 (K/uL)   DIFFERENTIAL     Status: Normal   Collection Time   06/29/11  1:13 AM      Component Value Range Comment   Neutrophils Relative 66  43 - 77 (%)    Neutro Abs 5.4  1.7 - 7.7 (K/uL)    Lymphocytes Relative 25  12 - 46 (%)    Lymphs Abs 2.1  0.7 - 4.0 (K/uL)    Monocytes Relative 7  3 - 12 (%)    Monocytes Absolute 0.6  0.1 - 1.0 (K/uL)    Eosinophils Relative 2  0 - 5 (%)    Eosinophils Absolute 0.2  0.0 - 0.7 (K/uL)    Basophils Relative 0  0 - 1 (%)    Basophils Absolute 0.0  0.0 - 0.1 (K/uL)   APTT     Status: Normal   Collection Time   06/29/11  1:13 AM      Component Value Range Comment   aPTT 25  24 - 37 (seconds)   PROTIME-INR     Status: Normal   Collection Time   06/29/11  1:13 AM      Component Value Range Comment   Prothrombin Time 13.1  11.6 - 15.2 (seconds)    INR 0.97  0.00 - 1.49    POCT I-STAT TROPONIN I     Status: Normal   Collection Time   06/29/11  1:22 AM      Component Value Range Comment   Troponin i, poc 0.00  0.00 - 0.08 (ng/mL)    Comment 3            POCT I-STAT, CHEM 8     Status: Normal   Collection Time   06/29/11  1:24 AM      Component Value Range Comment   Sodium 140  135 - 145 (mEq/L)    Potassium 3.9  3.5 - 5.1 (mEq/L)    Chloride 106  96 - 112 (mEq/L)    BUN 13  6 - 23 (mg/dL)    Creatinine, Ser 9.56  0.50 - 1.10 (mg/dL)    Glucose,  Bld 98  70 - 99 (mg/dL)    Calcium, Ion 2.13  1.12 -  1.32 (mmol/L)    TCO2 23  0 - 100 (mmol/L)    Hemoglobin 15.0  12.0 - 15.0 (g/dL)    HCT 16.1  09.6 - 04.5 (%)    Dg Chest Port 1 View  06/29/2011  *RADIOLOGY REPORT*  Clinical Data: Chest pain, shortness of breath and chest pressure.  PORTABLE CHEST - 1 VIEW  Comparison: Chest radiograph performed 04/06/2011  Findings: The lungs are well-aerated and clear.  There is no evidence of focal opacification, pleural effusion or pneumothorax.  The cardiomediastinal silhouette is within normal limits.  No acute osseous abnormalities are seen.  IMPRESSION: No acute cardiopulmonary process seen.  Original Report Authenticated By: Tonia Ghent, M.D.    Review of Systems  Constitutional: Positive for diaphoresis.  HENT: Positive for hearing loss.   Eyes: Negative.   Respiratory: Positive for shortness of breath. Negative for cough, hemoptysis, sputum production and wheezing.   Cardiovascular: Positive for chest pain and palpitations.  Gastrointestinal: Negative.   Genitourinary: Negative.   Musculoskeletal: Positive for myalgias.  Skin: Negative.   Neurological: Positive for tremors.  Endo/Heme/Allergies: Negative.   Psychiatric/Behavioral: Negative.     Blood pressure 150/106, pulse 103, temperature 98.2 F (36.8 C), temperature source Oral, resp. rate 26, height 5\' 3"  (1.6 m), weight 131 lb (59.421 kg), SpO2 100.00%. Physical Exam  Constitutional: She is oriented to person, place, and time. She appears distressed.  HENT:  Mouth/Throat: No oropharyngeal exudate.  Eyes: Scleral icterus is present.  Neck: Normal range of motion. Neck supple. No JVD present. No thyromegaly present.  Cardiovascular: Normal rate, regular rhythm, normal heart sounds and intact distal pulses.  Exam reveals no gallop and no friction rub.   No murmur heard. Respiratory: Effort normal and breath sounds normal. No respiratory distress. She has no wheezes. She has no  rales. She exhibits no tenderness.  GI: Soft. She exhibits no distension. There is no tenderness.  Musculoskeletal: Normal range of motion. She exhibits no edema and no tenderness.  Neurological: She is alert and oriented to person, place, and time.  Skin: Skin is warm and dry. No rash noted. She is not diaphoretic. No erythema. No pallor.  Psychiatric: She has a normal mood and affect. Her behavior is normal. Judgment and thought content normal.    EKG Sinus tachycardia, normal axis, early repolarization otherwise no significant st t changes.  Assessment/Plan Hypertensive crises Chest pain (normal coronaries in 2009 and normal stress test in 2011) Renal artery fibromuscular dysplasia s/p right renal artery stent Hx of CVA   Patient with chest pain in the setting of an hypertensive crises in the background of possibly syndrome X. Will control the blood pressure by restarting nitro infusion at a lower dose. I will start a diuretic to control her severe HTN (HCTZ 25 mg now then daily) I will start procardia (long acting) at 90mg  daily. This will treat her BP and also possibly prevent coronary vasospasm Will continue atenolol at 100mg  daily She takes clonidine 0.2 mg daily. I'm not certain how regularly she takes it and given its propensity for rebound HTN, will discontinue it. Zocor added. She stopped taking her statin for unclear reasons. Will check lipid panel. 2D echo Serial EKGs and cardiac enzymes   Given the episodic nature of her severe HTN as well as the associated diaphoresis, palpitations and tremulousness, I wonder if she may also have phaechromocytoma. Will order 24 hr urinary metanephrines Will check aldosterone/renin ration for completeness.    Grandville Silos 06/29/2011, 4:42 AM

## 2011-06-30 ENCOUNTER — Inpatient Hospital Stay (HOSPITAL_COMMUNITY): Payer: Medicaid Other

## 2011-06-30 DIAGNOSIS — R079 Chest pain, unspecified: Secondary | ICD-10-CM

## 2011-06-30 DIAGNOSIS — I10 Essential (primary) hypertension: Principal | ICD-10-CM

## 2011-06-30 LAB — BASIC METABOLIC PANEL
CO2: 26 mEq/L (ref 19–32)
Calcium: 8.8 mg/dL (ref 8.4–10.5)
Chloride: 101 mEq/L (ref 96–112)
Glucose, Bld: 135 mg/dL — ABNORMAL HIGH (ref 70–99)
Sodium: 136 mEq/L (ref 135–145)

## 2011-06-30 MED ORDER — LABETALOL HCL 300 MG PO TABS
300.0000 mg | ORAL_TABLET | Freq: Two times a day (BID) | ORAL | Status: DC
  Administered 2011-06-30 – 2011-07-01 (×3): 300 mg via ORAL
  Filled 2011-06-30 (×4): qty 1

## 2011-06-30 MED ORDER — MORPHINE SULFATE 2 MG/ML IJ SOLN
2.0000 mg | INTRAMUSCULAR | Status: DC | PRN
  Administered 2011-06-30 – 2011-07-01 (×2): 2 mg via INTRAVENOUS
  Filled 2011-06-30 (×2): qty 1

## 2011-06-30 MED ORDER — SODIUM CHLORIDE 0.9 % IJ SOLN
3.0000 mL | Freq: Two times a day (BID) | INTRAMUSCULAR | Status: DC
  Administered 2011-06-30 – 2011-07-01 (×3): 3 mL via INTRAVENOUS

## 2011-06-30 MED ORDER — IOHEXOL 350 MG/ML SOLN: 85.0000 mL | Freq: Once | INTRAVENOUS | Status: AC | PRN

## 2011-06-30 NOTE — Progress Notes (Signed)
Patient being transferred to 2022, patient alert, and orient. Report given to [facility], discussed reason for admission, history, recent nausea/vomiting and migraines. All questions answered.

## 2011-06-30 NOTE — Progress Notes (Signed)
Patient ID: [name] Rowe, female   DOB: 1961/04/11, 51 y.o.   MRN: [id number]    SUBJECTIVE: BP under control, no further chest pain.  Ruled out for MI. Labetalol gtt off.      . aspirin  324 mg Oral NOW   Or  . aspirin  300 mg Rectal NOW  . aspirin  81 mg Oral Daily  . clopidogrel  75 mg Oral Daily  . DULoxetine  30 mg Oral Daily  . hydrochlorothiazide  25 mg Oral Daily  . labetalol  20 mg Intravenous Once  . levothyroxine  250 mcg Oral QAC breakfast  . lubiprostone  24 mcg Oral 3 times weekly  . morphine      . NIFEdipine  90 mg Oral Daily  . ondansetron      . simvastatin  20 mg Oral q1800  . sodium chloride  3 mL Intravenous Q12H  . Vitamin D (Ergocalciferol)  50 years old Oral Weekly  . DISCONTD: atenolol  100 mg Oral BID  . DISCONTD: levothyroxine  200 mcg Oral Daily  . DISCONTD: lubiprostone  24 mcg Oral 3 times weekly      Filed Vitals:   06/30/11 0500 06/30/11 0600 06/30/11 0620 06/30/11 0700  BP: 122/85 133/78  129/63  Pulse: 98 76 93 73  Temp:      TempSrc:      Resp: 23 15  13   Height:      Weight:   60.9 kg (134 lb 4.2 oz)   SpO2: 98% 99%  99%    Intake/Output Summary (Last 24 hours) at 06/30/11 0743 Last data filed at 06/30/11 0400  Gross per 24 hour  Intake  969.5 ml  Output    675 ml  Net  294.5 ml    LABS: Basic Metabolic Panel:  Basename 06/29/11 0439 06/29/11 0124 06/29/11 0113  NA 139 140 --  K 4.0 3.9 --  CL 102 106 --  CO2 26 -- 22  GLUCOSE 92 98 --  BUN 12 13 --  CREATININE 0.95 1.10 --  CALCIUM 9.0 -- 9.6  MG -- -- --  PHOS -- -- --   Liver Function Tests: No results found for this basename: AST:2,ALT:2,ALKPHOS:2,BILITOT:2,PROT:2,ALBUMIN:2 in the last 72 hours No results found for this basename: LIPASE:2,AMYLASE:2 in the last 72 hours CBC:  Basename 06/29/11 0439 06/29/11 0124 06/29/11 0113  WBC 8.4 -- 8.3  NEUTROABS -- -- 5.4  HGB 15.4* 15.0 --  HCT 42.0 44.0 --  MCV 83.7 -- 82.9  PLT 331 -- 367   Cardiac  Enzymes:  Basename 06/29/11 1126 06/29/11 0438  CKTOTAL 71 75  CKMB 1.4 1.5  CKMBINDEX -- --  TROPONINI <0.30 <0.30   BNP: No components found with this basename: POCBNP:3 D-Dimer: No results found for this basename: DDIMER:2 in the last 72 hours Hemoglobin A1C: No results found for this basename: HGBA1C in the last 72 hours Fasting Lipid Panel:  Basename 06/29/11 0438  CHOL 229*  HDL 56  LDLCALC 156*  TRIG 84  CHOLHDL 4.1  LDLDIRECT --   Thyroid Function Tests:  Basename 06/29/11 0438  TSH 7.274*  T4TOTAL --  T3FREE --  THYROIDAB --   Anemia Panel: No results found for this basename: VITAMINB12,FOLATE,FERRITIN,TIBC,IRON,RETICCTPCT in the last 72 hours  RADIOLOGY: Dg Chest Port 1 View  06/29/2011  *RADIOLOGY REPORT*  Clinical Data: Chest pain, shortness of breath and chest pressure.  PORTABLE CHEST - 1 VIEW  Comparison: Chest radiograph performed 04/06/2011  Findings: The  lungs are well-aerated and clear.  There is no evidence of focal opacification, pleural effusion or pneumothorax.  The cardiomediastinal silhouette is within normal limits.  No acute osseous abnormalities are seen.  IMPRESSION: No acute cardiopulmonary process seen.  Original Report Authenticated By: Tonia Ghent, M.D.    PHYSICAL EXAM General: NAD Neck: No JVD, no thyromegaly or thyroid nodule.  Lungs: Clear to auscultation bilaterally with normal respiratory effort. CV: Nondisplaced PMI.  Heart regular S1/S2, no S3/S4, no murmur.  No peripheral edema.   Abdomen: Soft, nontender, no hepatosplenomegaly, no distention.  Neurologic: Alert and oriented x 3.  Psych: Normal affect. Extremities: No clubbing or cyanosis.   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   1. HTN: BP now under control.  Off labetalol gtt this  morning.   - Add labetalol 300 mg bid.   - Will get CT angiogram of abdomen to assess for worsening of fibromuscular dysplasia.  2. Chest pain: Resolved with BP improvement.  On Plavix/ASA with renal artery stent.  Now back on CCB (? Vasospasm).   3. Can go to floor today, possibly home in am if BP does well on po meds.  Needs social work to help with getting Medicaid.   [name] Rowe 06/30/2011 7:47 AM

## 2011-06-30 NOTE — Progress Notes (Signed)
Clinical Social Worker met with patient to discuss assistance with Medicaid. CSW provided patient with information on Medicaid requirements and [facility] DSS and the services they can provide. Patient stated that she has been on Medicaid previously and has a IT trainer. CSW called the financial counselor to speak with patient regarding medicaid application and to assist with hospital bill as well. CSW will sign off as social work intervention is no longer needed at this time. Please consult [place] again if new needs arise.   Rozetta Nunnery MSW, [employer] [phone]

## 2011-07-01 ENCOUNTER — Encounter (HOSPITAL_COMMUNITY): Payer: Self-pay | Admitting: Nurse Practitioner

## 2011-07-01 DIAGNOSIS — E785 Hyperlipidemia, unspecified: Secondary | ICD-10-CM | POA: Insufficient documentation

## 2011-07-01 DIAGNOSIS — R079 Chest pain, unspecified: Secondary | ICD-10-CM | POA: Insufficient documentation

## 2011-07-01 DIAGNOSIS — M797 Fibromyalgia: Secondary | ICD-10-CM | POA: Insufficient documentation

## 2011-07-01 DIAGNOSIS — I701 Atherosclerosis of renal artery: Secondary | ICD-10-CM | POA: Insufficient documentation

## 2011-07-01 DIAGNOSIS — I10 Essential (primary) hypertension: Secondary | ICD-10-CM | POA: Insufficient documentation

## 2011-07-01 DIAGNOSIS — Z8673 Personal history of transient ischemic attack (TIA), and cerebral infarction without residual deficits: Secondary | ICD-10-CM | POA: Insufficient documentation

## 2011-07-01 DIAGNOSIS — I201 Angina pectoris with documented spasm: Secondary | ICD-10-CM | POA: Insufficient documentation

## 2011-07-01 LAB — BASIC METABOLIC PANEL
BUN: 15 mg/dL (ref 6–23)
Chloride: 97 mEq/L (ref 96–112)
Creatinine, Ser: 1.09 mg/dL (ref 0.50–1.10)
GFR calc Af Amer: 68 mL/min — ABNORMAL LOW (ref >90)
GFR calc non Af Amer: 59 mL/min — ABNORMAL LOW (ref >90)
Glucose, Bld: 102 mg/dL — ABNORMAL HIGH (ref 70–99)

## 2011-07-01 MED ORDER — LEVOTHYROXINE SODIUM 125 MCG PO TABS: 250.0000 ug | ORAL_TABLET | Freq: Every day | ORAL | Status: DC

## 2011-07-01 MED ORDER — POTASSIUM CHLORIDE CRYS ER 20 MEQ PO TBCR: 20.0000 meq | EXTENDED_RELEASE_TABLET | Freq: Two times a day (BID) | ORAL | Status: DC

## 2011-07-01 MED ORDER — POTASSIUM CHLORIDE 20 MEQ/15ML (10%) PO LIQD
40.0000 meq | Freq: Once | ORAL | Status: AC
  Administered 2011-07-01: 40 meq via ORAL
  Filled 2011-07-01: qty 30

## 2011-07-01 MED ORDER — NIFEDIPINE ER OSMOTIC RELEASE 90 MG PO TB24: 90.0000 mg | ORAL_TABLET | Freq: Every day | ORAL | Status: DC

## 2011-07-01 MED ORDER — LABETALOL HCL 300 MG PO TABS: 300.0000 mg | ORAL_TABLET | Freq: Two times a day (BID) | ORAL | Status: DC

## 2011-07-01 MED ORDER — HYDROCHLOROTHIAZIDE 25 MG PO TABS: 25.0000 mg | ORAL_TABLET | Freq: Every day | ORAL | Status: DC

## 2011-07-01 MED ORDER — SIMVASTATIN 20 MG PO TABS: 20.0000 mg | ORAL_TABLET | Freq: Every day | ORAL | Status: DC

## 2011-07-01 NOTE — Progress Notes (Signed)
   CARE MANAGEMENT NOTE 07/01/2011  Patient:  [name] Rowe, [name] Rowe   Account Number:  [id number]  Date Initiated:  07/01/2011  Documentation initiated by:  GRAVES-BIGELOW,Khaled Herda  Subjective/Objective Assessment:   Pt admitted with hypertensive urgency- and cp. She stated she had medicaid in the past ,but has to meet her deductible. CM did call the [facility] to see if they can assist. Pt is from home alone and has some friends for support.     Action/Plan:   She explained that she did not have PCP. Cm made appointment at [facility] for orange card eligibility for 07-19-11 @ 8:45 and hospital f/u on 08-05-11 @ 11:00. CM did discuss pharmacies that she can purchase cheap meds.   Anticipated DC Date:  07/02/2011   Anticipated DC Plan:  HOME/SELF CARE  In-house referral  Financial Counselor      DC Planning Services  CM consult      Choice offered to / List presented to:             Status of service:  Completed, signed off Medicare Important Message given?   (If response is "NO", the following Medicare IM given date fields will be blank) Date Medicare IM given:   Date Additional Medicare IM given:    Discharge Disposition:  HOME/SELF CARE  Per UR Regulation:    Comments:  07-01-11 0935 Tomi Bamberger, RN,BSN [phone] No other needs assessed by CM at this time. Will continue to monitor.

## 2011-07-01 NOTE — Discharge Summary (Signed)
Patient ID: [name] Rowe,  MRN: [id number], DOB/AGE: 1960/08/24 51 y.o.  Admit date: 06/29/2011 Discharge date: 07/01/2011  Primary Care Provider: Mort Sawyers Primary Cardiologist: [facility]  Discharge Diagnoses Principal Problem:  *Hypertensive crisis Active Problems:  HYPOTHYROIDISM  HYPERLIPIDEMIA  Anxiety state, unspecified  DEPRESSION  MIGRAINE HEADACHE  CVA  RENAL ARTERY STENOSIS  FIBROMYALGIA  CHEST PAIN  Coronary vasospasm   Allergies Allergies  Allergen Reactions  . Hydromorphone Hcl Other (See Comments)    Palpitations  . Prochlorperazine Edisylate Other (See Comments)    Seizure   . Sumatriptan Palpitations    Procedures  06/29/2011 2-D echocardiogram  Left ventricle: Wall thickness was increased in a pattern of mild LVH. Systolic function was normal. The estimated ejection fraction was in the range of 55% to 60%. Wall motion was normal; there were no regional wall motion abnormalities. Doppler parameters are consistent with abnormal left ventricular relaxation (grade 1 diastolic dysfunction). There was no evidence of elevated ventricular filling pressure by Doppler parameters. 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 This appears to extend into the anterior and posterior branches.  Prior right renal artery stenting. No proximal stenosis. Stent appears open. _____________________________________________________  History of Present Illness  51 year old female with the above complex problem list who was in her usual state of health until the evening of January 22 when, while watching television, she had sudden onset of substernal chest discomfort and pressure associated with diaphoresis, palpitations, and dyspnea. 911 was called and patient was found to be severely hypertensive with systolic pressures  greater than 200 and diastolics greater than 100. She was taken to the [facility] where she remained hypertensive and tachycardic and she was placed on IV nitroglycerin infusion. She was admitted for further evaluation and management hypertensive urgency.  Hospital Course   Following admission, patient's blood pressure remained markedly elevated. IV nitroglycerin was continued and intravenous labetalol was added to her regimen. It was noted that she was recently taken off of calcium channel blocker therapy and this was resumed. As it was not clear patient was compliant with her home dose of clonidine, this was discontinued as was patient's home dose of atenolol in favor of labetalol. Diuretic therapy was also added and with these adjustments we are able to wean off the IV nitroglycerin and convert her from IV labetalol to oral labetalol. Her blood pressures now trending in the 120s to 140s range.  The 2-D echocardiogram was undertaken angina 22nd showing normal LV function and grade 1 diastolic dysfunction. Further, in the setting of prior renal artery stenosis, CT angiography of the abdomen and pelvis was performed on January 23 revealing patency of the prior placed right renal artery stent and changes of fibromuscular dysplasia within the left main renal artery.  Finally, it was noted that patient's TSH was elevated at 7.274. As result, her home dose of Synthroid was titrated to 250 mcg daily.  Patient will be discharged home today in good condition.  Discharge Vitals:  Blood pressure 120/77, pulse 84, temperature 98.3 F (36.8 C), temperature source Oral, resp. rate 18, height 5\' 3"  (1.6 m), weight 126 lb 12.2 oz (57.5 kg), SpO2 92.00%.    Labs: CBC:  Basename 06/29/11 0439 06/29/11 0124 06/29/11 0113  WBC 8.4 -- 8.3  NEUTROABS -- -- 5.4  HGB 15.4* 15.0 --  HCT 42.0 44.0 --  MCV 83.7 -- 82.9  PLT 331 -- 367  Basic Metabolic Panel:  Basename 07/01/11 0515 06/30/11 0906  NA 136  136  K 3.4* 3.8  CL 97 101  CO2 27 26  GLUCOSE 102* 135*  BUN 15 15  CREATININE 1.09 0.96  CALCIUM 9.5 8.8  MG -- --  PHOS -- --   Cardiac Enzymes:  Basename 06/29/11 1126 06/29/11 0438  CKTOTAL 71 75  CKMB 1.4 1.5  CKMBINDEX -- --  TROPONINI <0.30 <0.30   Fasting Lipid Panel:  Basename 06/29/11 0438  CHOL 229*  HDL 56  LDLCALC 156*  TRIG 84  CHOLHDL 4.1  LDLDIRECT --   Thyroid Function Tests:  Basename 06/29/11 0438  TSH 7.274*  T4TOTAL --  T3FREE --  THYROIDAB --    Disposition:  Follow-up Information    Follow up with Ron Parker, MD. (as scheduled)       Follow up with [name], MD on 07/16/2011. (11:30)    Contact information:   [address] [place] [address] [phone]          Discharge Medications:  Medication List  As of 07/01/2011  1:16 PM   STOP taking these medications         amLODipine 5 MG tablet      atenolol 100 MG tablet      cloNIDine 0.1 MG tablet      cloNIDine 0.2 MG tablet      levothyroxine 25 MCG tablet      traZODone 150 MG tablet         TAKE these medications         aspirin 81 MG tablet   Take 81 mg by mouth daily.      clopidogrel 75 MG tablet   Commonly known as: PLAVIX   Take 75 mg by mouth daily.      DULoxetine 30 MG capsule   Commonly known as: CYMBALTA   Take 30 mg by mouth daily.      ergocalciferol 50000 UNITS capsule   Commonly known as: VITAMIN D2   Take 50,000 Units by mouth once a week. On monday      hydrochlorothiazide 25 MG tablet   Commonly known as: HYDRODIURIL   Take 1 tablet (25 mg total) by mouth daily.      labetalol 300 MG tablet   Commonly known as: NORMODYNE   Take 1 tablet (300 mg total) by mouth 2 (two) times daily.      levothyroxine 125 MCG tablet   Commonly known as: SYNTHROID, LEVOTHROID   Take 2 tablets (250 mcg total) by mouth daily before breakfast.      lubiprostone 24 MCG capsule    Commonly known as: AMITIZA   Take 24 mcg by mouth 3 (three) times a week.      NIFEdipine 90 MG 24 hr tablet   Commonly known as: PROCARDIA XL/ADALAT-CC   Take 1 tablet (90 mg total) by mouth daily.      nitroGLYCERIN 0.4 MG SL tablet   Commonly known as: NITROSTAT   Place 0.4 mg under the tongue every 5 (five) minutes as needed. For chest pain      potassium chloride SA 20 MEQ tablet   Commonly known as: K-DUR,KLOR-CON   Take 1 tablet (20 mEq total) by mouth 2 (two) times daily.      simvastatin 20 MG tablet   Commonly known as: ZOCOR   Take 1 tablet (20 mg total) by mouth daily at  6 PM.      zolpidem 5 MG tablet   Commonly known as: AMBIEN   Take 5-10 mg by mouth at bedtime as needed. For sleep            Outstanding Labs/Studies  She will need f/u tft's in 4-6 wks given synthroid dose adjustment during this admission.  Duration of Discharge Encounter: Greater than 30 minutes including physician time.  Signed, Nicolasa Ducking NP 07/01/2011, 1:16 PM

## 2011-07-01 NOTE — Progress Notes (Signed)
Patient ID: [name] Rowe, female   DOB: Jun 21, 1960, 51 y.o.   MRN: [id number]     SUBJECTIVE: BP under control, no further chest pain.  Doing well today.  CTA abdomen yesterday showed patent proximal renal arteries, more difficult to evaluate distally.      Marland Kitchen aspirin  81 mg Oral Daily  . clopidogrel  75 mg Oral Daily  . DULoxetine  30 mg Oral Daily  . hydrochlorothiazide  25 mg Oral Daily  . labetalol  300 mg Oral BID  . levothyroxine  250 mcg Oral QAC breakfast  . lubiprostone  24 mcg Oral 3 times weekly  . NIFEdipine  90 mg Oral Daily  . potassium chloride  40 mEq Oral Once  . simvastatin  20 mg Oral q1800  . sodium chloride  3 mL Intravenous Q12H  . Vitamin D (Ergocalciferol)  50,000 Units Oral Weekly      Filed Vitals:   06/30/11 1711 06/30/11 1859 07/01/11 0441 07/01/11 0444  BP: 107/63 133/87 120/77   Pulse: 79 74 84   Temp: 98.3 F (36.8 C) 98.4 F (36.9 C) 98.3 F (36.8 C)   TempSrc: Oral Oral Oral   Resp: 14 18 18    Height:      Weight:    57.5 kg (126 lb 12.2 oz)  SpO2: 98% 98% 92%     Intake/Output Summary (Last 24 hours) at 07/01/11 0852 Last data filed at 07/01/11 0442  Gross per 24 hour  Intake      0 ml  Output    700 ml  Net   -700 ml    LABS: Basic Metabolic Panel:  Basename 07/01/11 0515 06/30/11 0906  NA 136 136  K 3.4* 3.8  CL 97 101  CO2 27 26  GLUCOSE 102* 135*  BUN 15 15  CREATININE 1.09 0.96  CALCIUM 9.5 8.8  MG -- --  PHOS -- --   Liver Function Tests: No results found for this basename: AST:2,ALT:2,ALKPHOS:2,BILITOT:2,PROT:2,ALBUMIN:2 in the last 72 hours No results found for this basename: LIPASE:2,AMYLASE:2 in the last 72 hours CBC:  Basename 06/29/11 0439 06/29/11 0124 06/29/11 0113  WBC 8.4 -- 8.3  NEUTROABS -- -- 5.4  HGB 15.4* 15.0 --  HCT 42.0 44.0 --  MCV 83.7 -- 82.9  PLT 331 -- 367   Cardiac Enzymes:  Basename 06/29/11 1126 06/29/11 0438  CKTOTAL 71 75  CKMB 1.4 1.5  CKMBINDEX -- --  TROPONINI <0.30  <0.30   BNP: No components found with this basename: POCBNP:3 D-Dimer: No results found for this basename: DDIMER:2 in the last 72 hours Hemoglobin A1C: No results found for this basename: HGBA1C in the last 72 hours Fasting Lipid Panel:  Basename 06/29/11 0438  CHOL 229*  HDL 56  LDLCALC 156*  TRIG 84  CHOLHDL 4.1  LDLDIRECT --   Thyroid Function Tests:  Basename 06/29/11 0438  TSH 7.274*  T4TOTAL --  T3FREE --  THYROIDAB --   Anemia Panel: No results found for this basename: VITAMINB12,FOLATE,FERRITIN,TIBC,IRON,RETICCTPCT in the last 72 hours   PHYSICAL EXAM General: NAD Neck: No JVD, no thyromegaly or thyroid nodule.  Lungs: Clear to auscultation bilaterally with normal respiratory effort. CV: Nondisplaced PMI.  Heart regular S1/S2, no S3/S4, no murmur.  No peripheral edema.   Abdomen: Soft, nontender, no hepatosplenomegaly, no distention.  Neurologic: Alert and oriented x 3.  Psych: Normal affect. Extremities: No clubbing or cyanosis.   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   1. HTN: BP now under control.  CT angiogram of the abdomen showed that her renal artery stent was likely patent and the proximal renals looked ok.  More difficult to evaluate more distal renal arteries.   - Continue current BP meds and add KCl.   - If she has further problems with BP, will do invasive renal angiogram.  For now, however, as her BP is controlled, will continue med treatment.  2. Chest pain: Resolved with BP improvement.  On Plavix/ASA with renal artery stent.  Now back on CCB (? Vasospasm).   3. I will discharge her today.  She should go home on ASA 81, Plavix 75, HCTZ 25, KCl 20, nifedipine XR 90, labetalol 300 bid, home statin dose, noncardiac meds as prior.  Followup with me or [name] Rowe  in 2 wks.   [name] 07/01/2011 8:52 AM

## 2011-07-02 LAB — OPIATE, QUANTITATIVE, URINE
Hydrocodone: NEGATIVE NG/ML
Oxymorphone: NEGATIVE NG/ML

## 2011-07-03 ENCOUNTER — Telehealth: Payer: Self-pay | Admitting: Nurse Practitioner

## 2011-07-03 NOTE — Telephone Encounter (Signed)
Pt called stating that all day today she's been having tachy palps with chest pain and lightheadedness.  She has no way of checking her bp (battery on bp machine died).  She's going to come into ED for eval.

## 2011-07-04 LAB — METANEPHRINES, URINE, 24 HOUR
Metaneph Total, Ur: 62 mcg/24 h — ABNORMAL LOW (ref 182–739)
Normetanephrine, 24H Ur: 38 mcg/24 h — ABNORMAL LOW (ref 88–649)
Volume, Urine-METAN: 500 mL

## 2011-07-06 ENCOUNTER — Encounter (HOSPITAL_COMMUNITY): Payer: Self-pay

## 2011-07-06 ENCOUNTER — Emergency Department (HOSPITAL_COMMUNITY): Payer: Medicaid Other

## 2011-07-06 ENCOUNTER — Inpatient Hospital Stay (HOSPITAL_COMMUNITY)
Admission: EM | Admit: 2011-07-06 | Discharge: 2011-07-07 | DRG: 310 | Disposition: A | Discharge status: Home / Self Care | Payer: Medicaid Other | Attending: Internal Medicine | Admitting: Internal Medicine

## 2011-07-06 ENCOUNTER — Other Ambulatory Visit: Payer: Self-pay

## 2011-07-06 DIAGNOSIS — I471 Supraventricular tachycardia, unspecified: Secondary | ICD-10-CM

## 2011-07-06 DIAGNOSIS — G43909 Migraine, unspecified, not intractable, without status migrainosus: Secondary | ICD-10-CM | POA: Diagnosis present

## 2011-07-06 DIAGNOSIS — I7789 Other specified disorders of arteries and arterioles: Secondary | ICD-10-CM | POA: Diagnosis present

## 2011-07-06 DIAGNOSIS — IMO0001 Reserved for inherently not codable concepts without codable children: Secondary | ICD-10-CM | POA: Diagnosis present

## 2011-07-06 DIAGNOSIS — R42 Dizziness and giddiness: Secondary | ICD-10-CM

## 2011-07-06 DIAGNOSIS — E876 Hypokalemia: Secondary | ICD-10-CM

## 2011-07-06 DIAGNOSIS — E785 Hyperlipidemia, unspecified: Secondary | ICD-10-CM | POA: Diagnosis present

## 2011-07-06 DIAGNOSIS — Z8673 Personal history of transient ischemic attack (TIA), and cerebral infarction without residual deficits: Secondary | ICD-10-CM

## 2011-07-06 DIAGNOSIS — E860 Dehydration: Secondary | ICD-10-CM | POA: Diagnosis present

## 2011-07-06 DIAGNOSIS — I498 Other specified cardiac arrhythmias: Principal | ICD-10-CM | POA: Diagnosis present

## 2011-07-06 DIAGNOSIS — R55 Syncope and collapse: Secondary | ICD-10-CM

## 2011-07-06 DIAGNOSIS — I10 Essential (primary) hypertension: Secondary | ICD-10-CM

## 2011-07-06 DIAGNOSIS — E079 Disorder of thyroid, unspecified: Secondary | ICD-10-CM | POA: Diagnosis present

## 2011-07-06 LAB — COMPREHENSIVE METABOLIC PANEL
ALT: 10 U/L (ref 0–35)
AST: 14 U/L (ref 0–37)
Alkaline Phosphatase: 95 U/L (ref 39–117)
CO2: 27 mEq/L (ref 19–32)
Calcium: 9 mg/dL (ref 8.4–10.5)
Chloride: 94 mEq/L — ABNORMAL LOW (ref 96–112)
GFR calc Af Amer: 81 mL/min — ABNORMAL LOW (ref >90)
GFR calc non Af Amer: 70 mL/min — ABNORMAL LOW (ref >90)
Glucose, Bld: 107 mg/dL — ABNORMAL HIGH (ref 70–99)
Potassium: 2.6 mEq/L — CL (ref 3.5–5.1)
Sodium: 135 mEq/L (ref 135–145)
Total Bilirubin: 0.6 mg/dL (ref 0.3–1.2)

## 2011-07-06 LAB — DIFFERENTIAL
Eosinophils Relative: 1 % (ref 0–5)
Lymphocytes Relative: 12 % (ref 12–46)
Lymphs Abs: 1.1 10*3/uL (ref 0.7–4.0)
Neutro Abs: 8 10*3/uL — ABNORMAL HIGH (ref 1.7–7.7)

## 2011-07-06 LAB — POCT I-STAT TROPONIN I

## 2011-07-06 LAB — CBC
MCV: 81.7 fL (ref 78.0–100.0)
Platelets: 380 10*3/uL (ref 150–400)
RBC: 5.35 MIL/uL — ABNORMAL HIGH (ref 3.87–5.11)
WBC: 9.9 10*3/uL (ref 4.0–10.5)

## 2011-07-06 LAB — CARDIAC PANEL(CRET KIN+CKTOT+MB+TROPI)
CK, MB: 1.8 ng/mL (ref 0.3–4.0)
Troponin I: <0.3 ng/mL (ref <0.30)
Troponin I: <0.3 ng/mL (ref <0.30)

## 2011-07-06 LAB — ALDOSTERONE + RENIN ACTIVITY W/ RATIO: Aldosterone: 5 ng/dL

## 2011-07-06 MED ORDER — SODIUM CHLORIDE 0.9 % IV SOLN: 250.0000 mL | INTRAVENOUS | Status: DC | PRN

## 2011-07-06 MED ORDER — DULOXETINE HCL 30 MG PO CPEP
30.0000 mg | ORAL_CAPSULE | Freq: Every day | ORAL | Status: DC
  Administered 2011-07-06 – 2011-07-07 (×2): 30 mg via ORAL
  Filled 2011-07-06 (×2): qty 1

## 2011-07-06 MED ORDER — POTASSIUM CHLORIDE CRYS ER 20 MEQ PO TBCR
40.0000 meq | EXTENDED_RELEASE_TABLET | Freq: Once | ORAL | Status: AC
  Administered 2011-07-06: 40 meq via ORAL
  Filled 2011-07-06 (×2): qty 1

## 2011-07-06 MED ORDER — LEVOTHYROXINE SODIUM 125 MCG PO TABS
250.0000 ug | ORAL_TABLET | Freq: Every day | ORAL | Status: DC
  Administered 2011-07-07: 250 ug via ORAL
  Filled 2011-07-06 (×2): qty 2

## 2011-07-06 MED ORDER — ADENOSINE 6 MG/2ML IV SOLN
INTRAVENOUS | Status: AC
  Administered 2011-07-06: 10:00:00
  Filled 2011-07-06: qty 4

## 2011-07-06 MED ORDER — POTASSIUM CHLORIDE CRYS ER 20 MEQ PO TBCR
20.0000 meq | EXTENDED_RELEASE_TABLET | Freq: Two times a day (BID) | ORAL | Status: DC
  Administered 2011-07-06 – 2011-07-07 (×2): 20 meq via ORAL
  Filled 2011-07-06 (×3): qty 1

## 2011-07-06 MED ORDER — SODIUM CHLORIDE 0.9 % IJ SOLN
3.0000 mL | Freq: Two times a day (BID) | INTRAMUSCULAR | Status: DC
  Administered 2011-07-06 – 2011-07-07 (×2): 3 mL via INTRAVENOUS

## 2011-07-06 MED ORDER — ACETAMINOPHEN 325 MG PO TABS: 650.0000 mg | ORAL_TABLET | ORAL | Status: DC | PRN

## 2011-07-06 MED ORDER — ASPIRIN 81 MG PO CHEW
81.0000 mg | CHEWABLE_TABLET | Freq: Every day | ORAL | Status: DC
Start: 2011-07-06 — End: 2011-07-07
  Administered 2011-07-06 – 2011-07-07 (×2): 81 mg via ORAL
  Filled 2011-07-06 (×2): qty 1

## 2011-07-06 MED ORDER — LUBIPROSTONE 24 MCG PO CAPS
24.0000 ug | ORAL_CAPSULE | Freq: Every day | ORAL | Status: DC | PRN
  Filled 2011-07-06: qty 1

## 2011-07-06 MED ORDER — ZOLPIDEM TARTRATE 5 MG PO TABS
5.0000 mg | ORAL_TABLET | Freq: Every evening | ORAL | Status: DC | PRN
  Administered 2011-07-06: 5 mg via ORAL
  Filled 2011-07-06: qty 1

## 2011-07-06 MED ORDER — ONDANSETRON HCL 4 MG/2ML IJ SOLN
INTRAMUSCULAR | Status: AC
  Administered 2011-07-06: 10:00:00
  Filled 2011-07-06: qty 2

## 2011-07-06 MED ORDER — SIMVASTATIN 20 MG PO TABS
20.0000 mg | ORAL_TABLET | Freq: Every day | ORAL | Status: DC
  Administered 2011-07-06: 20 mg via ORAL
  Filled 2011-07-06 (×2): qty 1

## 2011-07-06 MED ORDER — METOPROLOL TARTRATE 1 MG/ML IV SOLN
5.0000 mg | Freq: Once | INTRAVENOUS | Status: AC
  Administered 2011-07-06: 5 mg via INTRAVENOUS
  Filled 2011-07-06: qty 5

## 2011-07-06 MED ORDER — LABETALOL HCL 300 MG PO TABS
300.0000 mg | ORAL_TABLET | Freq: Two times a day (BID) | ORAL | Status: DC
  Administered 2011-07-06 – 2011-07-07 (×2): 300 mg via ORAL
  Filled 2011-07-06 (×5): qty 1

## 2011-07-06 MED ORDER — MORPHINE SULFATE 4 MG/ML IJ SOLN
4.0000 mg | Freq: Once | INTRAMUSCULAR | Status: AC
  Administered 2011-07-06: 4 mg via INTRAVENOUS
  Filled 2011-07-06: qty 1

## 2011-07-06 MED ORDER — NIFEDIPINE ER OSMOTIC RELEASE 90 MG PO TB24
90.0000 mg | ORAL_TABLET | Freq: Every day | ORAL | Status: DC
  Administered 2011-07-07: 90 mg via ORAL
  Filled 2011-07-06 (×2): qty 1

## 2011-07-06 MED ORDER — ONDANSETRON HCL 4 MG/2ML IJ SOLN
4.0000 mg | Freq: Once | INTRAMUSCULAR | Status: AC
  Administered 2011-07-06: 4 mg via INTRAVENOUS
  Filled 2011-07-06: qty 2

## 2011-07-06 MED ORDER — CLOPIDOGREL BISULFATE 75 MG PO TABS
ORAL_TABLET | ORAL | Status: AC
  Administered 2011-07-06: 75 mg via ORAL
  Filled 2011-07-06: qty 1

## 2011-07-06 MED ORDER — CLOPIDOGREL BISULFATE 75 MG PO TABS
75.0000 mg | ORAL_TABLET | Freq: Every day | ORAL | Status: DC
  Administered 2011-07-06 – 2011-07-07 (×2): 75 mg via ORAL
  Filled 2011-07-06: qty 1

## 2011-07-06 MED ORDER — POTASSIUM CHLORIDE CRYS ER 20 MEQ PO TBCR
40.0000 meq | EXTENDED_RELEASE_TABLET | Freq: Two times a day (BID) | ORAL | Status: DC
  Administered 2011-07-06: 40 meq via ORAL
  Filled 2011-07-06: qty 2

## 2011-07-06 MED ORDER — POTASSIUM CHLORIDE 10 MEQ/100ML IV SOLN
10.0000 meq | Freq: Once | INTRAVENOUS | Status: AC
  Administered 2011-07-06: 10 meq via INTRAVENOUS
  Filled 2011-07-06: qty 100

## 2011-07-06 MED ORDER — ONDANSETRON HCL 4 MG/2ML IJ SOLN: 4.0000 mg | Freq: Four times a day (QID) | INTRAMUSCULAR | Status: DC | PRN

## 2011-07-06 MED ORDER — SODIUM CHLORIDE 0.9 % IJ SOLN: 3.0000 mL | INTRAMUSCULAR | Status: DC | PRN

## 2011-07-06 MED ORDER — NITROGLYCERIN 0.4 MG SL SUBL
0.4000 mg | SUBLINGUAL_TABLET | SUBLINGUAL | Status: DC | PRN
Start: 2011-07-06 — End: 2011-07-07

## 2011-07-06 NOTE — ED Notes (Signed)
[id number] Ready

## 2011-07-06 NOTE — ED Notes (Signed)
Pt is very anxious and tearful due to not understanding what has been making her feel so weak and tired since she was discharged from here recently. Now she is back for the same thing and states that she has personal things going on in her life that is just hard on her. Dr. Lynelle Doctor made aware that patient is anxious and explained what patient expressed to this RN.

## 2011-07-06 NOTE — H&P (Signed)
[name] Rowe is an 51 y.o. female.   Chief Complaint: "I almost passed out and my heart is racing" HPI:  Yo woman with a h/o HTN, renal artery stenosis and recurrent chest pain. She was discharged from the hospital one week ago after presenting with a HTN urgency. She began to experience dizziness and lightheadedness two days ago and she returns for additional evaluation. She notes that she has not passed out but has nearly done so on several occaisions. The spells last a few hours on ave. On presentation today she had a heart rate around 150/min. She feels anxious and dizzy.  No chest pain or frank syncope.  Past Medical History  Diagnosis Date  . Migraine headache   . Chest pain   . HTN (hypertension)   . Renal artery stenosis   . HLD (hyperlipidemia)   . Fibromyalgia   . CVA (cerebral vascular accident)   . Coronary vasospasm   . Thyroid disease     Past Surgical History  Procedure Date  . Renal artery stent   . Spine surgery   . Abdominal hysterectomy   . Back surgery     Family History  Problem Relation Age of Onset  . Heart attack    . Stroke    . Hypertension Mother   . Hypertension Father   . Hypertension Brother     Died from a massive stroke in setting of severe HTN  . Metabolic syndrome Sister    Social History:  reports that she has never smoked. She has never used smokeless tobacco. She reports that she does not drink alcohol or use illicit drugs.  Allergies:  Allergies  Allergen Reactions  . Hydromorphone Hcl Other (See Comments)    Palpitations  . Prochlorperazine Edisylate Other (See Comments)    Seizure   . Sumatriptan Palpitations    Medications Prior to Admission  Medication Dose Route Frequency Provider Last Rate Last Dose  . adenosine (ADENOCARD) 6 MG/2ML injection           . metoprolol (LOPRESSOR) injection 5 mg  5 mg Intravenous Once Ward Givens, MD   5 mg at 07/06/11 1028  . metoprolol (LOPRESSOR) injection 5 mg  5 mg Intravenous Once  Ward Givens, MD   5 mg at 07/06/11 1102  . morphine 4 MG/ML injection 4 mg  4 mg Intravenous Once Ward Givens, MD   4 mg at 07/06/11 1024  . ondansetron (ZOFRAN) 4 MG/2ML injection           . ondansetron (ZOFRAN) injection 4 mg  4 mg Intravenous Once Ward Givens, MD   4 mg at 07/06/11 1016  . potassium chloride 10 mEq in 100 mL IVPB  10 mEq Intravenous Once Ward Givens, MD   10 mEq at 07/06/11 1216  . potassium chloride SA (K-DUR,KLOR-CON) CR tablet 40 mEq  40 mEq Oral Once Ward Givens, MD   40 mEq at 07/06/11 1212   Medications Prior to Admission  Medication Sig Dispense Refill  . aspirin 81 MG tablet Take 81 mg by mouth daily.       . clopidogrel (PLAVIX) 75 MG tablet Take 75 mg by mouth daily.        . DULoxetine (CYMBALTA) 30 MG capsule Take 30 mg by mouth daily.       . ergocalciferol (VITAMIN D2) 50000 UNITS capsule Take 50,000 Units by mouth once a week. On monday      . hydrochlorothiazide (  HYDRODIURIL) 25 MG tablet Take 1 tablet (25 mg total) by mouth daily.  30 tablet  6  . levothyroxine (SYNTHROID, LEVOTHROID) 125 MCG tablet Take 2 tablets (250 mcg total) by mouth daily before breakfast.  60 tablet  6  . NIFEdipine (PROCARDIA XL/ADALAT-CC) 90 MG 24 hr tablet Take 1 tablet (90 mg total) by mouth daily.  30 tablet  6  . potassium chloride SA (K-DUR,KLOR-CON) 20 MEQ tablet Take 1 tablet (20 mEq total) by mouth 2 (two) times daily.  30 tablet  6  . simvastatin (ZOCOR) 20 MG tablet Take 1 tablet (20 mg total) by mouth daily at 6 PM.  30 tablet  6  . zolpidem (AMBIEN) 5 MG tablet Take 5-10 mg by mouth at bedtime as needed. For sleep      . lubiprostone (AMITIZA) 24 MCG capsule Take 24 mcg by mouth daily as needed. For irritable bowel syndrome w/ constipation      . nitroGLYCERIN (NITROSTAT) 0.4 MG SL tablet Place 0.4 mg under the tongue every 5 (five) minutes as needed. For chest pain        Results for orders placed during the hospital encounter of 07/06/11 (from the past 48 hour(s))    CBC     Status: Abnormal   Collection Time   07/06/11 10:04 AM      Component Value Range Comment   WBC 9.9  4.0 - 10.5 (K/uL)    RBC 5.35 (*) 3.87 - 5.11 (MIL/uL)    Hemoglobin 15.6 (*) 12.0 - 15.0 (g/dL)    HCT 16.1  09.6 - 04.5 (%)    MCV 81.7  78.0 - 100.0 (fL)    MCH 29.2  26.0 - 34.0 (pg)    MCHC 35.7  30.0 - 36.0 (g/dL)    RDW 40.9  81.1 - 91.4 (%)    Platelets 380  150 - 400 (K/uL)   DIFFERENTIAL     Status: Abnormal   Collection Time   07/06/11 10:04 AM      Component Value Range Comment   Neutrophils Relative 82 (*) 43 - 77 (%)    Neutro Abs 8.0 (*) 1.7 - 7.7 (K/uL)    Lymphocytes Relative 12  12 - 46 (%)    Lymphs Abs 1.1  0.7 - 4.0 (K/uL)    Monocytes Relative 6  3 - 12 (%)    Monocytes Absolute 0.6  0.1 - 1.0 (K/uL)    Eosinophils Relative 1  0 - 5 (%)    Eosinophils Absolute 0.1  0.0 - 0.7 (K/uL)    Basophils Relative 0  0 - 1 (%)    Basophils Absolute 0.0  0.0 - 0.1 (K/uL)   COMPREHENSIVE METABOLIC PANEL     Status: Abnormal   Collection Time   07/06/11 10:04 AM      Component Value Range Comment   Sodium 135  135 - 145 (mEq/L)    Potassium 2.6 (*) 3.5 - 5.1 (mEq/L)    Chloride 94 (*) 96 - 112 (mEq/L)    CO2 27  19 - 32 (mEq/L)    Glucose, Bld 107 (*) 70 - 99 (mg/dL)    BUN 18  6 - 23 (mg/dL)    Creatinine, Ser 7.82  0.50 - 1.10 (mg/dL)    Calcium 9.0  8.4 - 10.5 (mg/dL)    Total Protein 7.5  6.0 - 8.3 (g/dL)    Albumin 3.9  3.5 - 5.2 (g/dL)    AST 14  0 -  37 (U/L)    ALT 10  0 - 35 (U/L)    Alkaline Phosphatase 95  39 - 117 (U/L)    Total Bilirubin 0.6  0.3 - 1.2 (mg/dL)    GFR calc non Af Amer 70 (*) >90 (mL/min)    GFR calc Af Amer 81 (*) >90 (mL/min)   MAGNESIUM     Status: Normal   Collection Time   07/06/11 10:04 AM      Component Value Range Comment   Magnesium 1.8  1.5 - 2.5 (mg/dL)   APTT     Status: Normal   Collection Time   07/06/11 10:04 AM      Component Value Range Comment   aPTT 24  24 - 37 (seconds)   PROTIME-INR     Status: Normal    Collection Time   07/06/11 10:04 AM      Component Value Range Comment   Prothrombin Time 14.3  11.6 - 15.2 (seconds)    INR 1.09  0.00 - 1.49    POCT I-STAT TROPONIN I     Status: Normal   Collection Time   07/06/11 10:15 AM      Component Value Range Comment   Troponin i, poc 0.00  0.00 - 0.08 (ng/mL)    Comment 3             Dg Chest Port 1 View  07/06/2011  *RADIOLOGY REPORT*  Clinical Data: Mid chest pressure.  History of hypertension.  PORTABLE CHEST - 1 VIEW  Comparison: Chest x-ray 06/29/2011.  Findings: Lung volumes are normal.  No consolidative airspace disease.  No pleural effusions.  No pneumothorax.  No pulmonary nodule or mass noted.  Pulmonary vasculature and the cardiomediastinal silhouette are within normal limits.  IMPRESSION: No radiographic evidence of acute cardiopulmonary disease.  Original Report Authenticated By: Florencia Reasons, M.D.    ROS - all systems reviewed and negative except as noted in the HPI.  Blood pressure 167/101, pulse 96, temperature 98.3 F (36.8 C), temperature source Oral, resp. rate 18, height 5\' 3"  (1.6 m), weight 58.06 kg (128 lb), SpO2 100.00%. Well appearing NAD HEENT: Unremarkable Neck:  No JVD, no thyromegally Lymphatics:  No adenopathy Back:  No CVA tenderness Lungs:  Clear HEART:  Regular rate rhythm, no murmurs, no rubs, no clicks Abd:  Flat, positive bowel sounds, no organomegally, no rebound, no guarding Ext:  2 plus pulses, no edema, no cyanosis, no clubbing Skin:  No rashes no nodules Neuro:  CN II through XII intact, motor grossly intact EKG: Sinus tachycardia.  Assessment/Plan 1. Dizziness and near syncope 2. HTN 3. Probable orthostasis, questionable autonomic dysfunction 4. Hypokalemia Rec: will admit for observation. Will hold off on her diuretic as I suspect she needs to run a little on the wet side. She will need orthostatic vitals q shift. Will replete K+. Observe on telemetry. Check serial enzymes.  Lewayne Bunting 07/06/2011, 2:03 PM

## 2011-07-06 NOTE — ED Provider Notes (Addendum)
History     CSN: [id number]  Arrival date & time 07/06/11  4098   First MD Initiated Contact with Patient 07/06/11 [phone]      Chief Complaint  Patient presents with  . Chest Pain    (Consider location/radiation/quality/duration/timing/severity/associated sxs/prior treatment) HPI  Patient relates she was admitted to the hospital one week ago for control of her hypertension. She relates she was discharged the third day. She relates she continues to feel like her heart is racing and re\re days ago she took her blood pressure at home it was 137/96 with a heart rate of 138. She states she spoke to her cardiologist Dr. Shirlee Latch that day and he told her she should return to the hospital. She relates she has continued to have palpitations, shortness of breath, dyspnea on exertion, feeling lightheaded, feeling weak, nauseated without vomiting. She states she's had a constant chest pressure all week but denies cough or diarrhea. She relates this morning she went to social services to try to get some help and she started having troubles, like her  breathing was getting worse, and she sat down on the floor. EMS was called who noted that she was having a heart rate of 156. She was given adenosine  12 mg IV push and her rate improved to the 130 range on arrival to the ER.  PCP none Cardiologist Dr. Shirlee Latch  Past Medical History  Diagnosis Date  . Migraine headache   . Chest pain   . HTN (hypertension)   . Renal artery stenosis   . HLD (hyperlipidemia)   . Fibromyalgia   . CVA (cerebral vascular accident)   . Coronary vasospasm   . Thyroid disease     Past Surgical History  Procedure Date  . Renal artery stent   . Spine surgery   . Abdominal hysterectomy   . Back surgery     Family History  Problem Relation Age of Onset  . Heart attack    . Stroke    . Hypertension Mother   . Hypertension Father   . Hypertension Brother     Died from a massive stroke in setting of severe HTN  .  Metabolic syndrome Sister     History  Substance Use Topics  . Smoking status: Never Smoker   . Smokeless tobacco: Never Used  . Alcohol Use: No   has not worked since 2010  OB History    Grav Para Term Preterm Abortions TAB SAB Ect Mult Living                  Review of Systems  All other systems reviewed and are negative.    Allergies  Hydromorphone hcl; Prochlorperazine edisylate; and Sumatriptan  Home Medications   Current Outpatient Rx  Name Route Sig Dispense Refill  . ASPIRIN 81 MG PO TABS Oral Take 81 mg by mouth daily.     Marland Kitchen CLOPIDOGREL BISULFATE 75 MG PO TABS Oral Take 75 mg by mouth daily.      . DULOXETINE HCL 30 MG PO CPEP Oral Take 30 mg by mouth daily.     . ERGOCALCIFEROL 50000 UNITS PO CAPS Oral Take 50,000 Units by mouth once a week. On monday    . HYDROCHLOROTHIAZIDE 25 MG PO TABS Oral Take 1 tablet (25 mg total) by mouth daily. 30 tablet 6  . LABETALOL HCL 300 MG PO TABS Oral Take 300 mg by mouth 2 (two) times daily.    Marland Kitchen LEVOTHYROXINE SODIUM 125 MCG  PO TABS Oral Take 2 tablets (250 mcg total) by mouth daily before breakfast. 60 tablet 6  . NIFEDIPINE ER OSMOTIC 90 MG PO TB24 Oral Take 1 tablet (90 mg total) by mouth daily. 30 tablet 6  . POTASSIUM CHLORIDE CRYS ER 20 MEQ PO TBCR Oral Take 1 tablet (20 mEq total) by mouth 2 (two) times daily. 30 tablet 6  . SIMVASTATIN 20 MG PO TABS Oral Take 1 tablet (20 mg total) by mouth daily at 6 PM. 30 tablet 6  . ZOLPIDEM TARTRATE 5 MG PO TABS Oral Take 5-10 mg by mouth at bedtime as needed. For sleep    . LUBIPROSTONE 24 MCG PO CAPS Oral Take 24 mcg by mouth daily as needed. For irritable bowel syndrome w/ constipation    . NITROGLYCERIN 0.4 MG SL SUBL Sublingual Place 0.4 mg under the tongue every 5 (five) minutes as needed. For chest pain      BP 154/99   Pulse 128  Temp(Src) 98.3 F (36.8 C) (Oral)  Resp 16  Ht 5\' 3"  (1.6 m)  Wt 128 lb (58.06 kg)  BMI 22.67 kg/m2  SpO2 100%  Vital signs normal     Physical Exam  Nursing note and vitals reviewed. Constitutional: She is oriented to person, place, and time. She appears well-developed and well-nourished.  Non-toxic appearance. She does not appear ill. No distress.  HENT:  Head: Normocephalic and atraumatic.  Right Ear: External ear normal.  Left Ear: External ear normal.  Nose: Nose normal. No mucosal edema or rhinorrhea.  Mouth/Throat: Oropharynx is clear and moist and mucous membranes are normal. No dental abscesses or uvula swelling.  Eyes: Conjunctivae and EOM are normal. Pupils are equal, round, and reactive to light.  Neck: Normal range of motion and full passive range of motion without pain. Neck supple.  Cardiovascular: Regular rhythm and normal heart sounds.  Tachycardia present.  Exam reveals no gallop and no friction rub.   No murmur heard. Pulmonary/Chest: Effort normal and breath sounds normal. No respiratory distress. She has no wheezes. She has no rhonchi. She has no rales. She exhibits no tenderness and no crepitus.  Abdominal: Soft. Normal appearance and bowel sounds are normal. She exhibits no distension. There is no tenderness. There is no rebound and no guarding.  Musculoskeletal: Normal range of motion. She exhibits no edema and no tenderness.       Moves all extremities well.   Neurological: She is alert and oriented to person, place, and time. She has normal strength. No cranial nerve deficit.  Skin: Skin is warm, dry and intact. No rash noted. No erythema. No pallor.  Psychiatric: Her speech is normal and behavior is normal. Her mood appears anxious.    ED Course  Procedures (including critical care time)    Medications  labetalol (NORMODYNE) 300 MG tablet (not administered)  potassium chloride 10 mEq in 100 mL IVPB (not administered)  potassium chloride SA (K-DUR,KLOR-CON) CR tablet 40 mEq (not administered)  adenosine (ADENOCARD) 6 MG/2ML injection (   Given by Other 07/06/11 0942)  ondansetron  (ZOFRAN) 4 MG/2ML injection ( mg  Given by Other 07/06/11 0943)  metoprolol (LOPRESSOR) injection 5 mg (5 mg Intravenous Given 07/06/11 1028)  morphine 4 MG/ML injection 4 mg (4 mg Intravenous Given 07/06/11 1024)  ondansetron (ZOFRAN) injection 4 mg (4 mg Intravenous Given 07/06/11 1016)  metoprolol (LOPRESSOR) injection 5 mg (5 mg Intravenous Given 07/06/11 1102)   Patient presented with sinus tachycardia at a rate of  139. She was given Lopressor 5 mg IV twice and her heart rate improved to the 90 range although she did get down to 70s.  After reviewing her labs she was treated for hypokalemia and was given oral potassium 40 mEq by mouth and 10 mEq IV over 1 hour.  12:34 Trish from [facility] cardiology notified for need for consult.  Patient was seen by Dr. Ladona Ridgel and admitted.   Results for orders placed during the hospital encounter of 07/06/11  CBC      Component Value Range   WBC 9.9  4.0 - 10.5 (K/uL)   RBC 5.35 (*) 3.87 - 5.11 (MIL/uL)   Hemoglobin 15.6 (*) 12.0 - 15.0 (g/dL)   HCT 29.5  62.1 - 30.8 (%)   MCV 81.7  78.0 - 100.0 (fL)   MCH 29.2  26.0 - 34.0 (pg)   MCHC 35.7  30.0 - 36.0 (g/dL)   RDW 65.7  84.6 - 96.2 (%)   Platelets 380  150 - 400 (K/uL)  DIFFERENTIAL      Component Value Range   Neutrophils Relative 82 (*) 43 - 77 (%)   Neutro Abs 8.0 (*) 1.7 - 7.7 (K/uL)   Lymphocytes Relative 12  12 - 46 (%)   Lymphs Abs 1.1  0.7 - 4.0 (K/uL)   Monocytes Relative 6  3 - 12 (%)   Monocytes Absolute 0.6  0.1 - 1.0 (K/uL)   Eosinophils Relative 1  0 - 5 (%)   Eosinophils Absolute 0.1  0.0 - 0.7 (K/uL)   Basophils Relative 0  0 - 1 (%)   Basophils Absolute 0.0  0.0 - 0.1 (K/uL)  COMPREHENSIVE METABOLIC PANEL      Component Value Range   Sodium 135  135 - 145 (mEq/L)   Potassium 2.6 (*) 3.5 - 5.1 (mEq/L)   Chloride 94 (*) 96 - 112 (mEq/L)   CO2 27  19 - 32 (mEq/L)   Glucose, Bld 107 (*) 70 - 99 (mg/dL)   BUN 18  6 - 23 (mg/dL)   Creatinine, Ser 9.52  0.50 - 1.10 (mg/dL)    Calcium 9.0  8.4 - 10.5 (mg/dL)   Total Protein 7.5  6.0 - 8.3 (g/dL)   Albumin 3.9  3.5 - 5.2 (g/dL)   AST 14  0 - 37 (U/L)   ALT 10  0 - 35 (U/L)   Alkaline Phosphatase 95  39 - 117 (U/L)   Total Bilirubin 0.6  0.3 - 1.2 (mg/dL)   GFR calc non Af Amer 70 (*) >90 (mL/min)   GFR calc Af Amer 81 (*) >90 (mL/min)  MAGNESIUM      Component Value Range   Magnesium 1.8  1.5 - 2.5 (mg/dL)  APTT      Component Value Range   aPTT 24  24 - 37 (seconds)  PROTIME-INR      Component Value Range   Prothrombin Time 14.3  11.6 - 15.2 (seconds)   INR 1.09  0.00 - 1.49   POCT I-STAT TROPONIN I      Component Value Range   Troponin i, poc 0.00  0.00 - 0.08 (ng/mL)   Comment 3            Laboratory interpretation all normal except hypokalemia   Dg Chest Port 1 View  07/06/2011  *RADIOLOGY REPORT*  Clinical Data: Mid chest pressure.  History of hypertension.  PORTABLE CHEST - 1 VIEW  Comparison: Chest x-ray 06/29/2011.  Findings: Lung volumes are normal.  No consolidative airspace disease.  No pleural effusions.  No pneumothorax.  No pulmonary nodule or mass noted.  Pulmonary vasculature and the cardiomediastinal silhouette are within normal limits.  IMPRESSION: No radiographic evidence of acute cardiopulmonary disease.  Original Report Authenticated By: Florencia Reasons, M.D.   Dg Chest Port 1 View  06/29/2011  *RADIOLOGY REPORT*  Clinical Data: Chest pain, shortness of breath and chest pressure.  PORTABLE CHEST - 1 VIEW  Comparison: Chest radiograph performed 04/06/2011  Findings: The lungs are well-aerated and clear.  There is no evidence of focal opacification, pleural effusion or pneumothorax.  The cardiomediastinal silhouette is within normal limits.  No acute osseous abnormalities are seen.  IMPRESSION: No acute cardiopulmonary process seen.  Original Report Authenticated By: Tonia Ghent, M.D.   Ct Angio Abd/pel W/ And/or W/o  06/30/2011  *RADIOLOGY REPORT*  Clinical Data:  Fibromuscular  dysplasia.  Prior renal stenting. Assess for renal artery stenosis.  CT ANGIOGRAPHY ABDOMEN AND PELVIS  Technique:  Multidetector CT imaging of the abdomen and pelvis was performed using the standard protocol during bolus administration of intravenous contrast.  Multiplanar reconstructed images including MIPs were obtained and reviewed to evaluate the vascular anatomy.  Contrast:   85 ml Omnipaque 350 IV  Comparison:  04/25/2010  Findings:  The patient is status post renal artery stenting within the right main renal artery.  No proximal stenosis.  It is difficult to assess for intrastent restenosis due to the small caliber of the stent and metallic artifact.  The stent appears patent.  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  No ostial stenosis.  Celiac artery, superior mesenteric artery, inferior mesenteric artery widely patent and unremarkable.  Aorta and common iliac arteries are widely patent and unremarkable.  Liver, spleen, gallbladder, pancreas, adrenals and kidneys are unremarkable. Bowel grossly unremarkable.  No free fluid, free air, or adenopathy.  Changes of posterior fusion at L2-3.  No acute bony abnormality.   Review of the MIP images confirms the above findings.  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  This appears to extend into the anterior and posterior branches.  Prior right renal artery stenting.  No proximal stenosis.  Stent appears open.  Original Report Authenticated By: Cyndie Chime, M.D.     Date: 07/06/2011  Rate: 139  Rhythm: sinus tachycardia  QRS Axis: normal  Intervals: normal  ST/T Wave abnormalities: nonspecific ST changes  Conduction Disutrbances:RAA, LAA  Narrative Interpretation:   Old EKG Reviewed: unchanged from 06/30/2011    Diagnoses that have been ruled out:  None  Diagnoses that are still under consideration:  None  Final diagnoses:   SVT (supraventricular tachycardia)  Near syncope  Chest pain  Hypokalemia   Plan admission  Devoria Albe, MD, FACEP  CRITICAL CARE Performed by: Devoria Albe L   Total critical care time: 32 minutes Critical care time was exclusive of separately billable procedures and treating other patients.  Critical care was necessary to treat or prevent imminent or life-threatening deterioration.  Critical care was time spent personally by me on the following activities: development of treatment plan with patient and/or surrogate as well as nursing, discussions with consultants, evaluation of patient's response to treatment, examination of patient, obtaining history from patient or surrogate, ordering and performing treatments and interventions, ordering and review of laboratory studies, ordering and review of radiographic studies, pulse oximetry and re-evaluation of patient's condition.    MDM  Ward Givens, MD 07/06/11 1459  Ward Givens, MD 07/06/11 [phone]

## 2011-07-06 NOTE — ED Notes (Signed)
MD at bedside. 

## 2011-07-06 NOTE — ED Notes (Signed)
MD at bedside. Dr. Ladona Ridgel w/cardiologist at bedside

## 2011-07-06 NOTE — ED Notes (Signed)
Per EMS pt picked up from Health dept in SVT. HR 158-178 on monitor. Given 12mg  Adenosine and 4 mg Zofran for nausea. ST on monitor afterwards. Also c/o SOB and nausea. Tightness in her chest.

## 2011-07-07 ENCOUNTER — Other Ambulatory Visit: Payer: Self-pay

## 2011-07-07 DIAGNOSIS — I495 Sick sinus syndrome: Secondary | ICD-10-CM

## 2011-07-07 LAB — BASIC METABOLIC PANEL
BUN: 19 mg/dL (ref 6–23)
Calcium: 8.8 mg/dL (ref 8.4–10.5)
GFR calc Af Amer: 63 mL/min — ABNORMAL LOW (ref >90)
GFR calc non Af Amer: 54 mL/min — ABNORMAL LOW (ref >90)
Potassium: 4.2 mEq/L (ref 3.5–5.1)

## 2011-07-07 LAB — CARDIAC PANEL(CRET KIN+CKTOT+MB+TROPI)
CK, MB: 1.7 ng/mL (ref 0.3–4.0)
Troponin I: <0.3 ng/mL (ref <0.30)

## 2011-07-07 NOTE — Progress Notes (Signed)
Patient ID: [name] Rowe, female   DOB: 25-Feb-1961, 51 y.o.   MRN: [id number]    SUBJECTIVE: [name] Rowe feels better this morning.  Telemetry overnight showed occasional sinus tachycardia, now normal sinus rhythm.  She never picked up labetalol after her last discharge and started to feel her heart racing.  HCTZ has been stopped and labetalol restarted.  She was very hypokalemic at admission, probably secondary to HCTZ.      Marland Kitchen aspirin  81 mg Oral Daily  . DULoxetine  30 mg Oral Daily  . labetalol  300 mg Oral BID  . levothyroxine  250 mcg Oral QAC breakfast  . metoprolol  5 mg Intravenous Once  . NIFEdipine  90 mg Oral Daily  . potassium chloride  10 mEq Intravenous Once  . potassium chloride SA  20 mEq Oral BID  . potassium chloride  40 mEq Oral Once  . simvastatin  20 mg Oral q1800  . sodium chloride  3 mL Intravenous Q12H  . DISCONTD: clopidogrel  75 mg Oral Daily  . DISCONTD: potassium chloride  40 mEq Oral BID      Filed Vitals:   07/07/11 0547 07/07/11 0549 07/07/11 0550 07/07/11 0936  BP: 118/79 120/81 109/73 125/84  Pulse: 85 94 98 105  Temp: 98.1 F (36.7 C)     TempSrc: Oral     Resp: 20     Height:      Weight:      SpO2: 98%       Intake/Output Summary (Last 24 hours) at 07/07/11 1034 Last data filed at 07/07/11 0936  Gross per 24 hour  Intake    479 ml  Output      0 ml  Net    479 ml    LABS: Basic Metabolic Panel:  Basename 07/07/11 0229 07/06/11 1004  NA 136 135  K 4.2 2.6*  CL 102 94*  CO2 28 27  GLUCOSE 105* 107*  BUN 19 18  CREATININE 1.16* 0.94  CALCIUM 8.8 9.0  MG -- 1.8  PHOS -- --   Liver Function Tests:  Basename 07/06/11 1004  AST 14  ALT 10  ALKPHOS 95  BILITOT 0.6  PROT 7.5  ALBUMIN 3.9   No results found for this basename: LIPASE:2,AMYLASE:2 in the last 72 hours CBC:  Basename 07/06/11 1004  WBC 9.9  NEUTROABS 8.0*  HGB 15.6*  HCT 43.7  MCV 81.7  PLT 380   Cardiac Enzymes:  Basename 07/07/11 0229  07/06/11 2051 07/06/11 1450  CKTOTAL 52 64 52  CKMB 1.7 1.8 1.8  CKMBINDEX -- -- --  TROPONINI <0.30 <0.30 <0.30   BNP: No components found with this basename: POCBNP:3 D-Dimer: No results found for this basename: DDIMER:2 in the last 72 hours Hemoglobin A1C: No results found for this basename: HGBA1C in the last 72 hours Fasting Lipid Panel: No results found for this basename: CHOL,HDL,LDLCALC,TRIG,CHOLHDL,LDLDIRECT in the last 72 hours Thyroid Function Tests: No results found for this basename: TSH,T4TOTAL,FREET3,T3FREE,THYROIDAB in the last 72 hours Anemia Panel: No results found for this basename: VITAMINB12,FOLATE,FERRITIN,TIBC,IRON,RETICCTPCT in the last 72 hours  RADIOLOGY: Dg Chest Port 1 View  07/06/2011  *RADIOLOGY REPORT*  Clinical Data: Mid chest pressure.  History of hypertension.  PORTABLE CHEST - 1 VIEW  Comparison: Chest x-ray 06/29/2011.  Findings: Lung volumes are normal.  No consolidative airspace disease.  No pleural effusions.  No pneumothorax.  No pulmonary nodule or mass noted.  Pulmonary vasculature and the cardiomediastinal silhouette are within  normal limits.  IMPRESSION: No radiographic evidence of acute cardiopulmonary disease.  Original Report Authenticated By: [name] Reasons, M.D.   Dg Chest Port 1 View  06/29/2011  *RADIOLOGY REPORT*  Clinical Data: Chest pain, shortness of breath and chest pressure.  PORTABLE CHEST - 1 VIEW  Comparison: Chest radiograph performed 04/06/2011  Findings: The lungs are well-aerated and clear.  There is no evidence of focal opacification, pleural effusion or pneumothorax.  The cardiomediastinal silhouette is within normal limits.  No acute osseous abnormalities are seen.  IMPRESSION: No acute cardiopulmonary process seen.  Original Report Authenticated By: [name] Ghent, M.D.   Ct Angio Abd/pel W/ And/or W/o  06/30/2011  *RADIOLOGY REPORT*  Clinical Data:  Fibromuscular dysplasia.  Prior renal stenting. Assess for renal  artery stenosis.  CT ANGIOGRAPHY ABDOMEN AND PELVIS  Technique:  Multidetector CT imaging of the abdomen and pelvis was performed using the standard protocol during bolus administration of intravenous contrast.  Multiplanar reconstructed images including MIPs were obtained and reviewed to evaluate the vascular anatomy.  Contrast:   85 ml Omnipaque 350 IV  Comparison:  04/25/2010  Findings:  The patient is status post renal artery stenting within the right main renal artery.  No proximal stenosis.  It is difficult to assess for intrastent restenosis due to the small caliber of the stent and metallic artifact.  The stent appears patent.  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  No ostial stenosis.  Celiac artery, superior mesenteric artery, inferior mesenteric artery widely patent and unremarkable.  Aorta and common iliac arteries are widely patent and unremarkable.  Liver, spleen, gallbladder, pancreas, adrenals and kidneys are unremarkable. Bowel grossly unremarkable.  No free fluid, free air, or adenopathy.  Changes of posterior fusion at L2-3.  No acute bony abnormality.   Review of the MIP images confirms the above findings.  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  This appears to extend into the anterior and posterior branches.  Prior right renal artery stenting.  No proximal stenosis.  Stent appears open.  Original Report Authenticated By: [name] Chime, M.D.    PHYSICAL EXAM General: NAD Neck: No JVD, no thyromegaly or thyroid nodule.  Lungs: Clear to auscultation bilaterally with normal respiratory effort. CV: Nondisplaced PMI.  Heart regular S1/S2, no S3/S4, no murmur.  No peripheral edema.  No carotid bruit.  Normal pedal pulses.  Abdomen: Soft, nontender, no hepatosplenomegaly, no distention.  Neurologic: Alert and oriented x 3.  Psych: Normal affect. Extremities: No  clubbing or cyanosis.   TELEMETRY: Reviewed telemetry pt in NSR  ASSESSMENT AND PLAN: 52 yo with history of difficult to control HTN and renal artery stenosis presented with dizziness and a sensation of her heart racing.  She has been noted to be in sinus tachycardia.  1. Sinus tachycardia: Likely due to withdrawal from beta blocker (never picked up prescription after recent discharge) and dehydration from HCTZ.  She has started back on labetalol and HR is down.  She feels better.  2. HTN: Would continue labetalol and nifedipine XR, stop HCTZ (very hypokalemic and probably somewhat dehydrated at admission).   3. Fibromuscular dysplasia: s/p renal artery stent in 2009. At this point, ok to stop Plavix.  Continue ASA 81 mg daily.  4. Dizziness: ? orthostasis (bp readings from yesterday were high, however).  Doing better today.  She will get up and walk around.   5. Disposition: Plan  home today if does ok walking. Cardiac meds; nifedipine XR 90 daily, labetalol 300 bid, ASA 81, Zocor 20, KCl 20.  Stop HCTZ and stop Plavix.   [name] 07/07/2011 10:40 AM

## 2011-07-07 NOTE — Progress Notes (Signed)
Marty Guevara to be D/C'd Home per MD order.  Discussed with the patient and all questions fully answered.   Alene Mires, [place]  Home Medication Instructions ZOX:[id number]   Printed on:07/07/11 1243  Medication Information                    aspirin 81 MG tablet Take 81 mg by mouth daily.            ergocalciferol (VITAMIN D2) 50000 UNITS capsule Take 50,000 Units by mouth once a week. On monday           nitroGLYCERIN (NITROSTAT) 0.4 MG SL tablet Place 0.4 mg under the tongue every 5 (five) minutes as needed. For chest pain           DULoxetine (CYMBALTA) 30 MG capsule Take 30 mg by mouth daily.            lubiprostone (AMITIZA) 24 MCG capsule Take 24 mcg by mouth daily as needed. For irritable bowel syndrome w/ constipation           zolpidem (AMBIEN) 5 MG tablet Take 5-10 mg by mouth at bedtime as needed. For sleep           NIFEdipine (PROCARDIA XL/ADALAT-CC) 90 MG 24 hr tablet Take 1 tablet (90 mg total) by mouth daily.           simvastatin (ZOCOR) 20 MG tablet Take 1 tablet (20 mg total) by mouth daily at 6 PM.           levothyroxine (SYNTHROID, LEVOTHROID) 125 MCG tablet Take 2 tablets (250 mcg total) by mouth daily before breakfast.           potassium chloride SA (K-DUR,KLOR-CON) 20 MEQ tablet Take 1 tablet (20 mEq total) by mouth 2 (two) times daily.           labetalol (NORMODYNE) 300 MG tablet Take 300 mg by mouth 2 (two) times daily.             VVS, Skin clean, dry and intact without evidence of skin break down, no evidence of skin tears noted. IV catheter discontinued intact. Site without signs and symptoms of complications. Dressing and pressure applied.  An After Visit Summary was printed and given to the patient. Patient escorted via WC, and D/C home via private auto.  Debbora Presto 07/07/2011 12:43 PM

## 2011-07-07 NOTE — Progress Notes (Signed)
   CARE MANAGEMENT NOTE 07/07/2011  Patient:  [name] Rowe, [name] Rowe   Account Number:  [id number]  Date Initiated:  07/07/2011  Documentation initiated by:  GRAVES-BIGELOW,Brendia Dampier  Subjective/Objective Assessment:   Pt in with NST and dizziness. Plan for home today. No needs for CM at this time.     Action/Plan:   Anticipated DC Date:  07/07/2011   Anticipated DC Plan:  HOME/SELF CARE      DC Planning Services  CM consult      Choice offered to / List presented to:             Status of service:  Completed, signed off Medicare Important Message given?   (If response is "NO", the following Medicare IM given date fields will be blank) Date Medicare IM given:   Date Additional Medicare IM given:    Discharge Disposition:  HOME/SELF CARE  Per UR Regulation:    Comments:

## 2011-07-07 NOTE — Discharge Summary (Signed)
Discharge Summary   Patient ID: [name] Rowe MRN: [id number], DOB/AGE: [birth year] 51 y.o.  Primary MD: Ron Parker, MD Primary Cardiologist: [name] MD  Admit date: 07/06/2011 D/C date:     07/07/2011      Primary Discharge Diagnoses:  1. Sinus tachycardia  - Likely due to withdrawal from beta blocker and dehydration from HCTZ  2. Dizziness:   - Question of orthostasis r/t dehydration in the setting of HCTZ initiation; HCTZ dc'd  3. Hypertension  - Nifedipine XR and Labetalol continued  4. Hypokalemia  - Secondary to HCTZ; HCTZ dc'd  5. Fibromuscular dysplasia s/p renal artery stent '09  - Plavix d'cd, ASA 81mg  continued  Secondary Discharge Diagnoses:  1. HLD 2. Coronary vasospasm 3. Recurrent chest pain 4. CVA  5. Hypothyroidism 6. Anxiety 7. Migraine headache  8. Fibromyalgia    Allergies Allergen Reactions  . Hydromorphone Hcl Other (See Comments)    Palpitations  . Prochlorperazine Edisylate Other (See Comments)    Seizure   . Sumatriptan Palpitations   Diagnostic Studies/Procedures:  Dg Chest Port 1 View 07/06/2011 Findings: Lung volumes are normal.  No consolidative airspace disease.  No pleural effusions.  No pneumothorax.  No pulmonary nodule or mass noted.  Pulmonary vasculature and the cardiomediastinal silhouette are within normal limits.  IMPRESSION: No radiographic evidence of acute cardiopulmonary disease.    History of Present Illness: 51 y.o. female w/ PMHx significant for difficult to control HTN, renal artery stenosis, and recurrent chest pain who presented to [facility] on 07/06/11 with complaints of dizziness and tachyardia.  She was discharged from the hospital on 07/01/11 after presenting with a HTN urgency for which she was started on labetalol and HCTZ. She never picked up labetalol after her last discharge and started to feel her heart racing. She also began to experience dizziness and lightheadedness two days ago.  She noted that she had not passed out but had nearly done so on several occaisions. EMS was called and noted her heart rate to be 156 and administered 12mg  IV adenosine with decrease in HR to 130s.  Hospital Course: In the ED, she had a heart rate around 150bpm and c/o feeling anxious and dizzy, but denied chest pain. EKG revealed sinus tachycardia 139 with nonspecific ST changes. CXR was without acute cardiopulmonary abnormalities. Initial poc troponin was negative. Labs were significant for K+ 2.6. She was admitted for overnight observation.  Cardiac enzymes were cycled and remained negative. It was thought her hypokalemia was likely secondary to HCTZ. K+ was supplemented with subsequent K+ 4.2. Orthostatic vitals were done with normal findings. Her sinus tachycardia was likely due to withdrawal from beta blocker as she never had her prescription filled and dehydration from HCTZ. Labetalol was restarted and HCTZ discontinued. Also during this admission her Plavix was discontinued and ASA 81mg  continued.  She was seen and evaluated by Dr. Shirlee Latch who felt she was stable for discharge home with plans for follow up as scheduled below.  Discharge Vitals: Blood pressure 125/84, pulse 105, temperature 98.1 F (36.7 C), temperature source Oral, resp. rate 20, height 5\' 3"  (1.6 m), weight 128 lb (58.06 kg), SpO2 98.00%.  Labs: Component Value Date   WBC 9.9 07/06/2011   HGB 15.6* 07/06/2011   HCT 43.7 07/06/2011   MCV 81.7 07/06/2011   PLT 380 07/06/2011    Lab 07/07/11 0229 07/06/11 1004  NA 136 135  K 4.2 2.6  CL 102 94  CO2 28 27  BUN  19 18  CREATININE 1.16* 0.94  CALCIUM 8.8 9.0  PROT -- 7.5  BILITOT -- 0.6  ALKPHOS -- 95  ALT -- 10  AST -- 14  GLUCOSE 105* 107   Basename 07/07/11 0229 07/06/11 2051 07/06/11 1450  CKTOTAL 52 64 52  CKMB 1.7 1.8 1.8  TROPONINI <0.30 <0.30 <0.30    Discharge Medications   Medication List  As of 07/07/2011 11:22 AM   STOP taking these medications          clopidogrel 75 MG tablet      hydrochlorothiazide 25 MG tablet         TAKE these medications         aspirin 81 MG tablet   Take 81 mg by mouth daily.      DULoxetine 30 MG capsule   Commonly known as: CYMBALTA   Take 30 mg by mouth daily.      ergocalciferol 50000 UNITS capsule   Commonly known as: VITAMIN D2   Take 50,000 Units by mouth once a week. On monday      labetalol 300 MG tablet   Commonly known as: NORMODYNE   Take 300 mg by mouth 2 (two) times daily.      levothyroxine 125 MCG tablet   Commonly known as: SYNTHROID, LEVOTHROID   Take 2 tablets (250 mcg total) by mouth daily before breakfast.      lubiprostone 24 MCG capsule   Commonly known as: AMITIZA   Take 24 mcg by mouth daily as needed. For irritable bowel syndrome w/ constipation      NIFEdipine 90 MG 24 hr tablet   Commonly known as: PROCARDIA XL/ADALAT-CC   Take 1 tablet (90 mg total) by mouth daily.      nitroGLYCERIN 0.4 MG SL tablet   Commonly known as: NITROSTAT   Place 0.4 mg under the tongue every 5 (five) minutes as needed. For chest pain      potassium chloride SA 20 MEQ tablet   Commonly known as: K-DUR,KLOR-CON   Take 1 tablet (20 mEq total) by mouth 2 (two) times daily.      simvastatin 20 MG tablet   Commonly known as: ZOCOR   Take 1 tablet (20 mg total) by mouth daily at 6 PM.      zolpidem 5 MG tablet   Commonly known as: AMBIEN   Take 5-10 mg by mouth at bedtime as needed. For sleep            Disposition   Discharge Orders    Future Appointments: Provider: Department: Dept Phone: Center:   07/16/2011 11:30 AM [name], MD [facility] [phone] [id number]     Future Orders Please Complete By Expires   Diet - low sodium heart healthy      Increase activity slowly        Follow-up Information    Follow up with Ron Parker, MD. Schedule an appointment as soon as possible for a visit in 2 weeks.      Follow up with [name], MD on  07/16/2011. (11:30)    Contact information:   [facility] [address] [place] [address] [phone]           Outstanding Labs/Studies: F/u Thyroid Function Tests in 3-5 weeks given synthroid dose adjustment during her last admission.  Duration of Discharge Encounter: Greater than 30 minutes including physician and PA time.  Signed, Roylene Heaton PA-C 07/07/2011, 11:22 AM

## 2011-07-07 NOTE — Progress Notes (Signed)
Pt ambulated and tolerated well.

## 2011-07-16 ENCOUNTER — Ambulatory Visit (INDEPENDENT_AMBULATORY_CARE_PROVIDER_SITE_OTHER): Payer: Self-pay | Admitting: Cardiology

## 2011-07-16 ENCOUNTER — Encounter: Payer: Self-pay | Admitting: Cardiology

## 2011-07-16 DIAGNOSIS — I10 Essential (primary) hypertension: Secondary | ICD-10-CM

## 2011-07-16 DIAGNOSIS — E785 Hyperlipidemia, unspecified: Secondary | ICD-10-CM

## 2011-07-16 DIAGNOSIS — I773 Arterial fibromuscular dysplasia: Secondary | ICD-10-CM

## 2011-07-16 DIAGNOSIS — R079 Chest pain, unspecified: Secondary | ICD-10-CM

## 2011-07-16 DIAGNOSIS — I7789 Other specified disorders of arteries and arterioles: Secondary | ICD-10-CM

## 2011-07-16 MED ORDER — LABETALOL HCL 200 MG PO TABS: 400.0000 mg | ORAL_TABLET | Freq: Two times a day (BID) | ORAL | Status: DC

## 2011-07-16 NOTE — Patient Instructions (Signed)
Your physician has requested that you have a carotid duplex. This test is an ultrasound of the carotid arteries in your neck. It looks at blood flow through these arteries that supply the brain with blood. Allow one hour for this exam. There are no restrictions or special instructions.  Your physician has requested that you have a renal artery duplex. During this test, an ultrasound is used to evaluate blood flow to the kidneys. Allow one hour for this exam. Do not eat after midnight the day before and avoid carbonated beverages. Take your medications as you usually do.  Your physician has recommended you make the following change in your medication:  increase Labetalol  Your physician recommends that you schedule a follow-up appointment in: 1 month with Tereso Newcomer PA  Your physician recommends that you schedule a follow-up appointment in: 2 months with Dr. Shirlee Latch  Your physician recommends that you return for lab work in: 2 months for fasting lipid and liver

## 2011-07-18 NOTE — Assessment & Plan Note (Signed)
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  Chest pain with exertion certainly is seen with microvascular disease and she is the right demographic.  She had a cath with no angiographic coronary disease in 2009 and Lexiscan myoview in 11/11 showed no evidence for ischemia or infarction.  She continues to have occasional chest pain.  She is on amlodipine that should help with vasospasm if this is part of the etiology of her chest pain.

## 2011-07-18 NOTE — Assessment & Plan Note (Addendum)
BP running on the high side.  She has history of renal artery stenosis from fibromuscular dysplasia.  CTA abdomen recently showed patent right renal stent but was unable to rule out obstructive disease on left.   - Will get renal artery dopplers.  - Increase labetalol to 400 mg bid.  - GIven fibromuscular dysplasia, will get carotid dopplers.  - Followup in 1 month with Tereso Newcomer.

## 2011-07-18 NOTE — Progress Notes (Signed)
PCP: Della Goo, MD  51 yo with history of fibromuscular dysplasia of the renal arteries, HTN, and possible coronary vasospasm versus Syndrome X presents for followup.  Patient was admitted to the hospital in [place] in 2009 with severe chest pain.  At that time, she had a left heart cath showing no angiographic coronary disease but the catheter did induce RCA spasm, leading to concern that her chest pain might be coronary vasospasm.  She also had HTN found to be probably related to fibromuscular dysplasia of the renal arteries.  She had a stent placed in the right renal artery in 2009.  Here in [place], she had a negative Lexiscan myoview in 11/11 and renal artery dopplers in 12/11 were suggestive of fibromuscular dysplasia with no significant stenosis.   She was admitted twice in 1/13, the first time with a hypertensive urgency (she had been off her BP meds for a number of days).  She was re-admitted several days later with orthostatic symptoms and probable rebound tachycardia (she had not picked up her beta blocker prescription after discharge).  She was restarted on beta blocker and HCTZ was stopped.  She has been doing well since discharge.  SBP at home has run from the 130s-150s.  She denies chest pain or exertional dyspnea.  Main symptomatic complaint is fatigue.   ECG: NSR, LAE  Labs (10/11): LDL 136, HDL 44, TGs 159, creatinine 1.06, K 3.8 Labs (11/11): LDL 101, HDL 49, LFTs normal, K 4.8, creatinine 0.83 Labs (1/13): K 4.2, creatinine 1.16, urine metanephrines normal, PRA/aldosterone ratio normal, LDL 156, HDL 56  Allergies (verified):  1)  ! Compazine 2)  ! Imitrex  Past Medical History: 1. Migraine headaches 2. Chest pain: No angiographic coronary disease on cath in 2009 in [place].  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  Suspect coronary vasospasm versus microvascular obstruction.  Lexiscan myoview (11/11): EF 79%, normal perfusion.  3. HTN: Likely related to  renal artery stenosis. Urinary metanephrines and plasma renin/aldosterone ratio normal.  4. 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  She had a renal artery stent in 2009.  Renal artery dopplers (12/11) suggestive of fibromuscular dysplasia but no significant stenosis. CTA abdomen (1/13) with evidence for fibromuscular dysplasia, right renal artery stent appears patent.  5. Hyperlipidemia 6. Fibromyalgia 7. Echo (1/13) EF 55-60%, grade I diastolic dysfunction. 8. Hypothyroidism     Family History: Mother with MI at 73, Father with MI at 63, brother with MI at 61 and died with stroke at 60  Social History: Moved to [place] from [place] with her husband.  Denies smoking cigarettes or using illegal drugs Alcohol Use - no Drug Use - no  Review of Systems        All systems reviewed and negative except as per HPI.   Current Outpatient Prescriptions  Medication Sig Dispense Refill  . aspirin 81 MG tablet Take 81 mg by mouth daily.       . DULoxetine (CYMBALTA) 30 MG capsule Take 30 mg by mouth daily.       . ergocalciferol (VITAMIN D2) 50000 UNITS capsule Take 50,000 Units by mouth once a week. On monday      . labetalol (NORMODYNE) 200 MG tablet Take 2 tablets (400 mg total) by mouth 2 (two) times daily.  60 tablet  6  . levothyroxine (SYNTHROID, LEVOTHROID) 125 MCG tablet Take 2 tablets (250 mcg total) by mouth daily before breakfast.  60 tablet  6  . lubiprostone (AMITIZA) 24 MCG  capsule Take 24 mcg by mouth daily as needed. For irritable bowel syndrome w/ constipation      . NIFEdipine (PROCARDIA XL/ADALAT-CC) 90 MG 24 hr tablet Take 1 tablet (90 mg total) by mouth daily.  30 tablet  6  . nitroGLYCERIN (NITROSTAT) 0.4 MG SL tablet Place 0.4 mg under the tongue every 5 (five) minutes as needed. For chest pain      . potassium chloride SA (K-DUR,KLOR-CON) 20 MEQ tablet Take 1 tablet (20 mEq total) by mouth 2 (two) times daily.  30 tablet  6  . simvastatin (ZOCOR) 20  MG tablet Take 1 tablet (20 mg total) by mouth daily at 6 PM.  30 tablet  6  . zolpidem (AMBIEN) 5 MG tablet Take 5-10 mg by mouth at bedtime as needed. For sleep        BP 140/92  Pulse 101  Resp 19  Ht 5\' 5"  (1.651 m)  Wt 59.33 kg (130 lb 12.8 oz)  BMI 21.77 kg/m2  SpO2 99% General:  Well developed, well nourished, in no acute distress. Neck:  Neck supple, no JVD. No masses, thyromegaly or abnormal cervical nodes. Lungs:  Clear bilaterally to auscultation and percussion. Heart:  Non-displaced PMI, chest non-tender; regular rate and rhythm, S1, S2 without murmurs, rubs or gallops. Carotid upstroke normal, no bruit.  Pedals normal pulses. No edema, no varicosities. Abdomen:  Bowel sounds positive; abdomen soft and non-tender without masses, organomegaly, or hernias noted. No hepatosplenomegaly. Extremities:  No clubbing or cyanosis. Neurologic:  Alert and oriented x 3. Psych:  Normal affect.

## 2011-07-18 NOTE — Assessment & Plan Note (Signed)
She has restarted on simvastatin.  Will check lipids/LFTs in 2 months.

## 2011-07-19 ENCOUNTER — Encounter (INDEPENDENT_AMBULATORY_CARE_PROVIDER_SITE_OTHER): Payer: Medicaid Other | Admitting: Cardiology

## 2011-07-19 DIAGNOSIS — I773 Arterial fibromuscular dysplasia: Secondary | ICD-10-CM

## 2011-07-19 DIAGNOSIS — I7789 Other specified disorders of arteries and arterioles: Secondary | ICD-10-CM

## 2011-07-19 DIAGNOSIS — I10 Essential (primary) hypertension: Secondary | ICD-10-CM

## 2011-07-20 NOTE — Progress Notes (Signed)
Addended by: Judithe Modest D on: 07/20/2011 11:51 AM   Modules accepted: Orders

## 2011-07-26 ENCOUNTER — Telehealth: Payer: Self-pay | Admitting: Cardiology

## 2011-07-26 NOTE — Telephone Encounter (Signed)
Pt given recent renal artery doppler results

## 2011-07-26 NOTE — Telephone Encounter (Signed)
Fu call °Patient returning your call °

## 2011-08-03 ENCOUNTER — Encounter (INDEPENDENT_AMBULATORY_CARE_PROVIDER_SITE_OTHER): Payer: Medicaid Other

## 2011-08-03 DIAGNOSIS — I773 Arterial fibromuscular dysplasia: Secondary | ICD-10-CM

## 2011-08-03 DIAGNOSIS — R0989 Other specified symptoms and signs involving the circulatory and respiratory systems: Secondary | ICD-10-CM

## 2011-08-10 NOTE — Progress Notes (Signed)
Pt notified of carotid doppler results.  

## 2011-08-13 ENCOUNTER — Ambulatory Visit (INDEPENDENT_AMBULATORY_CARE_PROVIDER_SITE_OTHER): Payer: Medicaid Other | Admitting: Physician Assistant

## 2011-08-13 ENCOUNTER — Encounter: Payer: Self-pay | Admitting: Physician Assistant

## 2011-08-13 VITALS — BP 120/70 | HR 93 | Ht 63.0 in | Wt 128.1 lb

## 2011-08-13 DIAGNOSIS — I773 Arterial fibromuscular dysplasia: Secondary | ICD-10-CM | POA: Insufficient documentation

## 2011-08-13 DIAGNOSIS — I7789 Other specified disorders of arteries and arterioles: Secondary | ICD-10-CM

## 2011-08-13 DIAGNOSIS — R079 Chest pain, unspecified: Secondary | ICD-10-CM

## 2011-08-13 DIAGNOSIS — I10 Essential (primary) hypertension: Secondary | ICD-10-CM

## 2011-08-13 DIAGNOSIS — E039 Hypothyroidism, unspecified: Secondary | ICD-10-CM

## 2011-08-13 MED ORDER — NIFEDIPINE ER 60 MG PO TB24: 120.0000 mg | ORAL_TABLET | Freq: Every day | ORAL | Status: DC

## 2011-08-13 NOTE — Patient Instructions (Signed)
Your physician has recommended you make the following change in your medication: INCREASE NIFEDIPINE TO 120 MG DAILY, YOU WILL TAKE 2 TABLETS OF THE 60 MG  Your physician recommends that you schedule a follow-up appointment in: AS ALREADY SCHEDULED WITH DR. Shirlee Latch

## 2011-08-13 NOTE — Progress Notes (Signed)
[address] [place], [place]  [address] Phone: [phone] Fax:  [phone]  Date:  08/13/2011   Name:  [name] Rowe       DOB:  Aug 24, 1960 MRN:  [id number]  PCP:  Dr. Philipp Deputy Primary Cardiologist:  Dr. [name]  Primary Electrophysiologist:  None    History of Present Illness: [name] Rowe is a 51 y.o. female who presents for follow up.  She has a history of fibromuscular dysplasia of the renal arteries, HTN, and possible coronary vasospasm versus Syndrome X. Patient was admitted to the hospital in [place] in 2009 with severe chest pain. At that time, she had a left heart cath showing no angiographic coronary disease but the catheter did induce RCA spasm, leading to concern that her chest pain might be coronary vasospasm. She also had HTN found to be probably related to fibromuscular dysplasia of the renal arteries. She had a stent placed in the right renal artery in 2009. Here in [place], she had a negative Lexiscan myoview in 11/11 and renal artery dopplers in 12/11 were suggestive of fibromuscular dysplasia with no significant stenosis.  Admitted twice in 1/13, the first time with a hypertensive urgency (she had been off her BP meds for a number of days). She was re-admitted several days later with orthostatic symptoms and probable rebound tachycardia (she had not picked up her beta blocker prescription after discharge). She was restarted on beta blocker and HCTZ was stopped.   She saw Dr. [name] in 2/13.  She continued to complain of some chest pain and she was continued on amlodipine to cover for vasospasm.  Blood pressure is still running high.  Follow up renal arterial Dopplers 07/19/11: Evidence of fibromuscular dysplasia but no evidence of renal artery stenosis.  Carotid Dopplers 08/03/11:  0-39% bilateral ICA stenosis.  Her labetalol was increased and she was asked to follow up today.  She continues to have episodes of chest pain.   This is not necessarily with exertion.  She denies DOE.  No orthopnea.  No pedal edema.  No syncope.  She feels her heart racing at times.    Past Medical History:  1. Migraine headaches  2. Chest pain: No angiographic coronary disease on cath in 2009 in [place]. 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 Suspect coronary vasospasm versus microvascular obstruction. Lexiscan myoview (11/11): EF 79%, normal perfusion.  3. HTN: Likely related to renal artery stenosis. Urinary metanephrines and plasma renin/aldosterone ratio normal.  4. 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 She had a renal artery stent in 2009. Renal artery dopplers (12/11) suggestive of fibromuscular dysplasia but no significant stenosis. CTA abdomen (1/13) with evidence for fibromuscular dysplasia, right renal artery stent appears patent.  5. Hyperlipidemia  6. Fibromyalgia  7. Echo (1/13) EF 55-60%, grade I diastolic dysfunction.  8. Hypothyroidism   Current Outpatient Prescriptions  Medication Sig Dispense Refill  . aspirin 81 MG tablet Take 81 mg by mouth daily.       . cyclobenzaprine (FLEXERIL) 10 MG tablet Take 10 mg by mouth as needed.      . DULoxetine (CYMBALTA) 30 MG capsule Take 30 mg by mouth daily.       . ergocalciferol (VITAMIN D2) 50000 UNITS capsule Take 50,000 Units by mouth once a week. On monday      . labetalol (NORMODYNE) 200 MG tablet Take 2 tablets (400 mg total) by mouth 2 (two) times daily.  60 tablet  6  . levothyroxine (  SYNTHROID, LEVOTHROID) 125 MCG tablet Take 2 tablets (250 mcg total) by mouth daily before breakfast.  60 tablet  6  . lubiprostone (AMITIZA) 24 MCG capsule Take 24 mcg by mouth daily as needed. For irritable bowel syndrome w/ constipation      . NIFEdipine (PROCARDIA XL/ADALAT-CC) 90 MG 24 hr tablet Take 1 tablet (90 mg total) by mouth daily.  30 tablet  6  . nitroGLYCERIN (NITROSTAT) 0.4 MG SL tablet Place 0.4 mg under the tongue every 5 (five) minutes as needed.  For chest pain      . potassium chloride SA (K-DUR,KLOR-CON) 20 MEQ tablet Take 1 tablet (20 mEq total) by mouth 2 (two) times daily.  30 tablet  6  . simvastatin (ZOCOR) 20 MG tablet Take 1 tablet (20 mg total) by mouth daily at 6 PM.  30 tablet  6  . zolpidem (AMBIEN) 5 MG tablet Take 5-10 mg by mouth at bedtime as needed. For sleep        Allergies: Allergies  Allergen Reactions  . Hydromorphone Hcl Other (See Comments)    Palpitations  . Prochlorperazine Edisylate Other (See Comments)    Seizure   . Sumatriptan Palpitations    History  Substance Use Topics  . Smoking status: Never Smoker   . Smokeless tobacco: Never Used  . Alcohol Use: No     ROS:  Please see the history of present illness.    All other systems reviewed and negative.   PHYSICAL EXAM: VS:  BP 120/70  Pulse 93  Ht 5\' 3"  (1.6 m)  Wt 128 lb 1.9 oz (58.115 kg)  BMI 22.70 kg/m2 Repeat BP by me: 120/96  Well nourished, well developed, in no acute distress HEENT: normal Neck: no JVD Cardiac:  normal S1, S2; RRR; no murmur Lungs:  clear to auscultation bilaterally, no wheezing, rhonchi or rales Abd: soft, nontender, no hepatomegaly Ext: no edema Skin: warm and dry Neuro:  CNs 2-12 intact, no focal abnormalities noted  EKG:  Sinus rhythm, heart rate 93, normal axis, no acute changes  ASSESSMENT AND PLAN:  1. HTN (hypertension)  Diastolic BP still uncontrolled.  She was hospitalized in 06/2011 with hypokalemia and dehydration.  HCTZ was stopped then.  Discussed with Dr. [name].  We will try to increase Nifedipine to 120 mg QD to see if this helps.  Follow up with Dr. [name] as scheduled.   2. Chest pain  ? Vasospastic angina.  Adjust CCB as noted.     3. Fibromuscular dysplasia  Follow up renal dopplers negative for RAS.   4. HYPOTHYROIDISM  Managed by PCP.  She is on a high dose of synthroid.  But, recent TSH was apparently low and dose adjusted.  She is due for follow up soon with  her PCP.       Luna Glasgow, PA-C  10:23 AM 08/13/2011

## 2011-09-08 ENCOUNTER — Other Ambulatory Visit (INDEPENDENT_AMBULATORY_CARE_PROVIDER_SITE_OTHER): Payer: Medicaid Other

## 2011-09-08 ENCOUNTER — Ambulatory Visit (INDEPENDENT_AMBULATORY_CARE_PROVIDER_SITE_OTHER): Payer: Medicaid Other | Admitting: Cardiology

## 2011-09-08 VITALS — BP 144/90 | HR 98 | Ht 63.0 in | Wt 126.0 lb

## 2011-09-08 DIAGNOSIS — I10 Essential (primary) hypertension: Secondary | ICD-10-CM

## 2011-09-08 DIAGNOSIS — E785 Hyperlipidemia, unspecified: Secondary | ICD-10-CM

## 2011-09-08 DIAGNOSIS — R079 Chest pain, unspecified: Secondary | ICD-10-CM

## 2011-09-08 LAB — CBC WITH DIFFERENTIAL/PLATELET
Basophils Relative: 0.3 % (ref 0.0–3.0)
Eosinophils Absolute: 0.1 10*3/uL (ref 0.0–0.7)
Eosinophils Relative: 1.8 % (ref 0.0–5.0)
Hemoglobin: 14 g/dL (ref 12.0–15.0)
MCHC: 33.9 g/dL (ref 30.0–36.0)
MCV: 89.7 fl (ref 78.0–100.0)
Monocytes Absolute: 0.6 10*3/uL (ref 0.1–1.0)
Neutro Abs: 4.3 10*3/uL (ref 1.4–7.7)
RBC: 4.62 Mil/uL (ref 3.87–5.11)
WBC: 6.7 10*3/uL (ref 4.5–10.5)

## 2011-09-08 LAB — BASIC METABOLIC PANEL
CO2: 23 mEq/L (ref 19–32)
Chloride: 100 mEq/L (ref 96–112)
Potassium: 3.9 mEq/L (ref 3.5–5.1)
Sodium: 137 mEq/L (ref 135–145)

## 2011-09-08 LAB — LIPID PANEL
HDL: 57.4 mg/dL (ref >39.00)
Triglycerides: 57 mg/dL (ref 0.0–149.0)

## 2011-09-08 LAB — LDL CHOLESTEROL, DIRECT: Direct LDL: 146.5 mg/dL

## 2011-09-08 LAB — HEPATIC FUNCTION PANEL
ALT: 17 U/L (ref 0–35)
Albumin: 4.3 g/dL (ref 3.5–5.2)
Alkaline Phosphatase: 79 U/L (ref 39–117)
Total Protein: 7.6 g/dL (ref 6.0–8.3)

## 2011-09-08 NOTE — Patient Instructions (Signed)
Your physician recommends that you have a FASTING lipid profile /liver profile/BMET/CBC today.  Your physician wants you to follow-up in: 4 months with Dr Shirlee Latch. (August 2013). You will receive a reminder letter in the mail two months in advance. If you don't receive a letter, please call our office to schedule the follow-up appointment.

## 2011-09-09 ENCOUNTER — Encounter: Payer: Self-pay | Admitting: Cardiology

## 2011-09-09 NOTE — Progress Notes (Signed)
PCP: Dr. 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  Patient was admitted to the hospital in [place] in 2009 with severe chest pain.  At that time, she had a left heart cath showing no angiographic coronary disease but the catheter did induce RCA spasm, leading to concern that her chest pain might be coronary vasospasm.  She also had HTN found to be probably related to fibromuscular dysplasia of the renal arteries.  She had a stent placed in the right renal artery in 2009.  Here in [place], she had a negative Lexiscan myoview in 11/11.  She was admitted twice in 1/13, the first time with a hypertensive urgency (she had been off her BP meds for a number of days).  She was re-admitted several days later with orthostatic symptoms and probable rebound tachycardia (she had not picked up her beta blocker prescription after discharge).  She was restarted on beta blocker and HCTZ was stopped.    BP now seems to be under better control.  SBP < 140 when she checks at home, 144/90 today.  Carotids in 2/13 showed no significant stenosis and renal artery dopplers in 2/13 showed no significant stenosis.  Main complaint is fatigue.  Otherwise, she has not had recent chest pain episodes.  She walks 20 minutes/day without dyspnea.  She is limited by back pain.   Labs (10/11): LDL 136, HDL 44, TGs 159, creatinine 1.06, K 3.8 Labs (11/11): LDL 101, HDL 49, LFTs normal, K 4.8, creatinine 0.83 Labs (1/13): K 4.2, creatinine 1.16, urine metanephrines normal, PRA/aldosterone ratio normal, LDL 156, HDL 56  Allergies (verified):  1)  ! Compazine 2)  ! Imitrex  Past Medical History: 1. Migraine headaches 2. Chest pain: No angiographic coronary disease on cath in 2009 in [place].  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  Suspect coronary vasospasm versus microvascular obstruction.  Lexiscan myoview  (11/11): EF 79%, normal perfusion.  3. HTN: Likely related to renal artery stenosis. Urinary metanephrines and plasma renin/aldosterone ratio normal.  4. 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  She had a renal artery stent in 2009.  Renal artery dopplers (12/11) suggestive of fibromuscular dysplasia but no significant stenosis. CTA abdomen (1/13) with evidence for fibromuscular dysplasia, right renal artery stent appears patent.  Renal artery dopplers (2/13) with FMD but no evidence for signficant stenosis.  5. Hyperlipidemia 6. Fibromyalgia 7. Echo (1/13) EF 55-60%, grade I diastolic dysfunction. 8. Hypothyroidism 9. Carotid dopplers (2/13): consistent with FMD but no significant stenosis.  10. Fibromuscular dysplasia.      Family History: Mother with MI at 71, Father with MI at 54, brother with MI at 47 and died with stroke at 46  Social History: Moved to [place] from [place] with her husband.  Denies smoking cigarettes or using illegal drugs Alcohol Use - no Drug Use - no   Current Outpatient Prescriptions  Medication Sig Dispense Refill  . aspirin 81 MG tablet Take 81 mg by mouth daily.       . cyclobenzaprine (FLEXERIL) 10 MG tablet Take 10 mg by mouth as needed.      . DULoxetine (CYMBALTA) 30 MG capsule Take 30 mg by mouth daily.       . ergocalciferol (VITAMIN D2) 50000 UNITS capsule Take 50,000 Units by mouth once a week. On monday      . labetalol (NORMODYNE) 200 MG tablet Take 2 tablets (  400 mg total) by mouth 2 (two) times daily.  60 tablet  6  . levothyroxine (SYNTHROID, LEVOTHROID) 125 MCG tablet Take 2 tablets (250 mcg total) by mouth daily before breakfast.  60 tablet  6  . lubiprostone (AMITIZA) 24 MCG capsule Take 24 mcg by mouth daily as needed. For irritable bowel syndrome w/ constipation      . NIFEdipine (PROCARDIA-XL/ADALAT CC) 60 MG 24 hr tablet Take 2 tablets (120 mg total) by mouth daily.  60 tablet  11  . nitroGLYCERIN (NITROSTAT) 0.4  MG SL tablet Place 0.4 mg under the tongue every 5 (five) minutes as needed. For chest pain      . potassium chloride SA (K-DUR,KLOR-CON) 20 MEQ tablet Take 1 tablet (20 mEq total) by mouth 2 (two) times daily.  30 tablet  6  . simvastatin (ZOCOR) 20 MG tablet Take 1 tablet (20 mg total) by mouth daily at 6 PM.  30 tablet  6  . zolpidem (AMBIEN) 5 MG tablet Take 5-10 mg by mouth at bedtime as needed. For sleep        BP 144/90  Pulse 98  Ht 5\' 3"  (1.6 m)  Wt 126 lb (57.153 kg)  BMI 22.32 kg/m2 General:  Well developed, well nourished, in no acute distress. Neck:  Neck supple, no JVD. No masses, thyromegaly or abnormal cervical nodes. Lungs:  Clear bilaterally to auscultation and percussion. Heart:  Non-displaced PMI, chest non-tender; regular rate and rhythm, S1, S2 without murmurs, rubs or gallops. Carotid upstroke normal, no bruit.  Pedals normal pulses. No edema, no varicosities. Abdomen:  Bowel sounds positive; abdomen soft and non-tender without masses, organomegaly, or hernias noted. No hepatosplenomegaly. Extremities:  No clubbing or cyanosis. Neurologic:  Alert and oriented x 3. Psych:  Normal affect.

## 2011-09-09 NOTE — Assessment & Plan Note (Signed)
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  Chest pain with exertion certainly is seen with microvascular disease and she is the right demographic.  She had a cath with no angiographic coronary disease in 2009 and Lexiscan myoview in 11/11 showed no evidence for ischemia or infarction.  She has not had chest pain in the last few weeks.  She is on amlodipine that should help with vasospasm if this is part of the etiology of her chest pain.

## 2011-09-09 NOTE — Assessment & Plan Note (Signed)
BP is now better controlled.  She has history of renal artery stenosis from fibromuscular dysplasia.  CTA abdomen recently showed patent right renal stent but was unable to rule out obstructive disease on left.  Renal artery dopplers suggested no significant stenosis.  - Continue current meds.

## 2011-09-10 ENCOUNTER — Other Ambulatory Visit: Payer: Self-pay | Admitting: *Deleted

## 2011-09-10 DIAGNOSIS — I10 Essential (primary) hypertension: Secondary | ICD-10-CM

## 2011-09-10 MED ORDER — ATORVASTATIN CALCIUM 40 MG PO TABS: 40.0000 mg | ORAL_TABLET | Freq: Every day | ORAL | Status: DC

## 2011-09-14 ENCOUNTER — Ambulatory Visit: Payer: Self-pay | Admitting: Cardiology

## 2011-09-14 ENCOUNTER — Other Ambulatory Visit: Payer: Self-pay

## 2011-11-12 ENCOUNTER — Other Ambulatory Visit: Payer: Medicaid Other

## 2011-12-14 ENCOUNTER — Encounter (HOSPITAL_COMMUNITY): Payer: Self-pay

## 2011-12-14 ENCOUNTER — Emergency Department (HOSPITAL_COMMUNITY): Payer: Medicaid Other

## 2011-12-14 ENCOUNTER — Observation Stay (HOSPITAL_COMMUNITY)
Admission: EM | Admit: 2011-12-14 | Discharge: 2011-12-16 | Disposition: A | Discharge status: Home / Self Care | Payer: Medicaid Other | Attending: Family Medicine | Admitting: Family Medicine

## 2011-12-14 DIAGNOSIS — E785 Hyperlipidemia, unspecified: Secondary | ICD-10-CM

## 2011-12-14 DIAGNOSIS — F419 Anxiety disorder, unspecified: Secondary | ICD-10-CM | POA: Diagnosis present

## 2011-12-14 DIAGNOSIS — I150 Renovascular hypertension: Secondary | ICD-10-CM | POA: Insufficient documentation

## 2011-12-14 DIAGNOSIS — R0602 Shortness of breath: Secondary | ICD-10-CM | POA: Insufficient documentation

## 2011-12-14 DIAGNOSIS — I701 Atherosclerosis of renal artery: Secondary | ICD-10-CM | POA: Insufficient documentation

## 2011-12-14 DIAGNOSIS — I773 Arterial fibromuscular dysplasia: Secondary | ICD-10-CM

## 2011-12-14 DIAGNOSIS — E039 Hypothyroidism, unspecified: Secondary | ICD-10-CM

## 2011-12-14 DIAGNOSIS — I10 Essential (primary) hypertension: Secondary | ICD-10-CM

## 2011-12-14 DIAGNOSIS — I201 Angina pectoris with documented spasm: Secondary | ICD-10-CM

## 2011-12-14 DIAGNOSIS — F411 Generalized anxiety disorder: Secondary | ICD-10-CM | POA: Insufficient documentation

## 2011-12-14 DIAGNOSIS — Z8673 Personal history of transient ischemic attack (TIA), and cerebral infarction without residual deficits: Secondary | ICD-10-CM | POA: Insufficient documentation

## 2011-12-14 DIAGNOSIS — R079 Chest pain, unspecified: Principal | ICD-10-CM

## 2011-12-14 HISTORY — DX: Hypothyroidism, unspecified: E03.9

## 2011-12-14 HISTORY — DX: Tachycardia, unspecified: R00.0

## 2011-12-14 HISTORY — DX: Arterial fibromuscular dysplasia: I77.3

## 2011-12-14 LAB — CBC WITH DIFFERENTIAL/PLATELET
Basophils Absolute: 0 10*3/uL (ref 0.0–0.1)
Eosinophils Relative: 1 % (ref 0–5)
HCT: 38.3 % (ref 36.0–46.0)
Hemoglobin: 13.7 g/dL (ref 12.0–15.0)
Lymphocytes Relative: 25 % (ref 12–46)
MCV: 84.9 fL (ref 78.0–100.0)
Monocytes Absolute: 0.5 10*3/uL (ref 0.1–1.0)
Monocytes Relative: 8 % (ref 3–12)
Neutro Abs: 4.2 10*3/uL (ref 1.7–7.7)
RDW: 12.3 % (ref 11.5–15.5)
WBC: 6.4 10*3/uL (ref 4.0–10.5)

## 2011-12-14 LAB — POCT I-STAT TROPONIN I: Troponin i, poc: 0.01 ng/mL (ref 0.00–0.08)

## 2011-12-14 LAB — BASIC METABOLIC PANEL
BUN: 10 mg/dL (ref 6–23)
CO2: 22 mEq/L (ref 19–32)
Calcium: 8.7 mg/dL (ref 8.4–10.5)
Chloride: 106 mEq/L (ref 96–112)
Creatinine, Ser: 0.83 mg/dL (ref 0.50–1.10)
Glucose, Bld: 72 mg/dL (ref 70–99)

## 2011-12-14 MED ORDER — NITROGLYCERIN 2 % TD OINT
1.0000 [in_us] | TOPICAL_OINTMENT | Freq: Three times a day (TID) | TRANSDERMAL | Status: DC
  Administered 2011-12-15: 1 [in_us] via TOPICAL
  Filled 2011-12-14: qty 30

## 2011-12-14 MED ORDER — DULOXETINE HCL 30 MG PO CPEP
30.0000 mg | ORAL_CAPSULE | Freq: Every day | ORAL | Status: DC
  Administered 2011-12-15 – 2011-12-16 (×2): 30 mg via ORAL
  Filled 2011-12-14 (×2): qty 1

## 2011-12-14 MED ORDER — NITROGLYCERIN 0.4 MG SL SUBL
0.4000 mg | SUBLINGUAL_TABLET | SUBLINGUAL | Status: DC | PRN
  Administered 2011-12-14: 0.4 mg via SUBLINGUAL

## 2011-12-14 MED ORDER — SODIUM CHLORIDE 0.9 % IV SOLN
INTRAVENOUS | Status: DC
  Administered 2011-12-15: 1000 mL via INTRAVENOUS

## 2011-12-14 MED ORDER — MORPHINE SULFATE 2 MG/ML IJ SOLN: 1.0000 mg | Freq: Four times a day (QID) | INTRAMUSCULAR | Status: DC | PRN

## 2011-12-14 MED ORDER — LUBIPROSTONE 24 MCG PO CAPS: 24.0000 ug | ORAL_CAPSULE | Freq: Every day | ORAL | Status: DC | PRN

## 2011-12-14 MED ORDER — ACETAMINOPHEN 325 MG PO TABS
975.0000 mg | ORAL_TABLET | Freq: Once | ORAL | Status: AC
  Administered 2011-12-14: 975 mg via ORAL

## 2011-12-14 MED ORDER — SODIUM CHLORIDE 0.9 % IJ SOLN
3.0000 mL | Freq: Two times a day (BID) | INTRAMUSCULAR | Status: DC
  Administered 2011-12-15 – 2011-12-16 (×4): 3 mL via INTRAVENOUS

## 2011-12-14 MED ORDER — NITROGLYCERIN 0.4 MG SL SUBL: 0.4000 mg | SUBLINGUAL_TABLET | SUBLINGUAL | Status: DC | PRN

## 2011-12-14 MED ORDER — MORPHINE SULFATE 4 MG/ML IJ SOLN: 4.0000 mg | Freq: Once | INTRAMUSCULAR | Status: DC

## 2011-12-14 MED ORDER — LEVOTHYROXINE SODIUM 125 MCG PO TABS
250.0000 ug | ORAL_TABLET | Freq: Every day | ORAL | Status: DC
  Administered 2011-12-15 – 2011-12-16 (×2): 250 ug via ORAL
  Filled 2011-12-14 (×3): qty 2

## 2011-12-14 MED ORDER — ASPIRIN EC 81 MG PO TBEC: 81.0000 mg | DELAYED_RELEASE_TABLET | Freq: Every day | ORAL | Status: DC

## 2011-12-14 MED ORDER — KETOROLAC TROMETHAMINE 30 MG/ML IJ SOLN
30.0000 mg | Freq: Once | INTRAMUSCULAR | Status: AC
  Administered 2011-12-14: 30 mg via INTRAVENOUS
  Filled 2011-12-14: qty 1

## 2011-12-14 MED ORDER — HYDRALAZINE HCL 20 MG/ML IJ SOLN
10.0000 mg | Freq: Four times a day (QID) | INTRAMUSCULAR | Status: DC | PRN
  Filled 2011-12-14: qty 0.5

## 2011-12-14 MED ORDER — ZOLPIDEM TARTRATE 5 MG PO TABS
5.0000 mg | ORAL_TABLET | Freq: Every evening | ORAL | Status: DC | PRN
  Administered 2011-12-15 (×2): 5 mg via ORAL
  Filled 2011-12-14: qty 1
  Filled 2011-12-14: qty 2

## 2011-12-14 MED ORDER — NIFEDIPINE ER 60 MG PO TB24
120.0000 mg | ORAL_TABLET | Freq: Every day | ORAL | Status: DC
  Filled 2011-12-14: qty 2

## 2011-12-14 MED ORDER — VITAMIN D (ERGOCALCIFEROL) 1.25 MG (50000 UNIT) PO CAPS: 50000.0000 [IU] | ORAL_CAPSULE | ORAL | Status: DC

## 2011-12-14 MED ORDER — POTASSIUM CHLORIDE CRYS ER 20 MEQ PO TBCR
20.0000 meq | EXTENDED_RELEASE_TABLET | Freq: Two times a day (BID) | ORAL | Status: DC
  Administered 2011-12-15 – 2011-12-16 (×4): 20 meq via ORAL
  Filled 2011-12-14 (×5): qty 1

## 2011-12-14 MED ORDER — ASPIRIN 81 MG PO CHEW: 324.0000 mg | CHEWABLE_TABLET | Freq: Once | ORAL | Status: DC

## 2011-12-14 MED ORDER — CYCLOBENZAPRINE HCL 10 MG PO TABS: 10.0000 mg | ORAL_TABLET | Freq: Three times a day (TID) | ORAL | Status: DC | PRN

## 2011-12-14 MED ORDER — ACETAMINOPHEN 325 MG PO TABS
ORAL_TABLET | ORAL | Status: AC
  Filled 2011-12-14: qty 3

## 2011-12-14 MED ORDER — ASPIRIN EC 325 MG PO TBEC
325.0000 mg | DELAYED_RELEASE_TABLET | Freq: Every day | ORAL | Status: DC
  Administered 2011-12-15 – 2011-12-16 (×2): 325 mg via ORAL
  Filled 2011-12-14 (×2): qty 1

## 2011-12-14 MED ORDER — LABETALOL HCL 200 MG PO TABS
400.0000 mg | ORAL_TABLET | Freq: Two times a day (BID) | ORAL | Status: DC
  Administered 2011-12-15 – 2011-12-16 (×4): 400 mg via ORAL
  Filled 2011-12-14 (×6): qty 2

## 2011-12-14 MED ORDER — ATORVASTATIN CALCIUM 40 MG PO TABS
40.0000 mg | ORAL_TABLET | Freq: Every day | ORAL | Status: DC
  Administered 2011-12-15 – 2011-12-16 (×2): 40 mg via ORAL
  Filled 2011-12-14 (×2): qty 1

## 2011-12-14 NOTE — H&P (Addendum)
[name] Rowe is an 51 y.o. female.    Pcp: Dr. Glennon Hamilton  Cardiologist: Maximiano Coss  Chief Complaint:  Palpitation, chest pressure HPI: 51 yo female with hx of CVA, hypertension, RAS s/p stent c/o chest pain "pressure" associated with "stabbing" in the left shoulder, starting about 2:45pm at rest while watching TV.  + palpitation, + sob, + nausea, no emesis, denies fever, chills, cough, brbpr, black stool.  Pain lasted intermittently, occurring a few minutes at a time.  Pt was given slg nitro by EMS, with relief after about 10 minutes.     In ED  ekg nsr at 99, nl axis, no st, t changes.  Initial set of cardiac markers negative.  ,  ED requests that pt be admitted for cp r/o.  Note pt had cardiac catheterization about 2 years ago in [place], negative per pt,  + vasospasm.   Past Medical History  Diagnosis Date  . Migraine headache   . Chest pain   . HTN (hypertension)   . Renal artery stenosis   . HLD (hyperlipidemia)   . Fibromyalgia   . CVA (cerebral vascular accident)   . Coronary vasospasm   . Thyroid disease     Past Surgical History  Procedure Date  . Renal artery stent   . Spine surgery   . Abdominal hysterectomy   . Back surgery     Family History  Problem Relation Age of Onset  . Heart attack    . Stroke    . Hypertension Mother   . Hypertension Father   . Hypertension Brother     Died from a massive stroke in setting of severe HTN  . Metabolic syndrome Sister    Social History:  reports that she has never smoked. She has never used smokeless tobacco. She reports that she does not drink alcohol or use illicit drugs.  Allergies:  Allergies  Allergen Reactions  . Hydromorphone Hcl Other (See Comments)    Palpitations  . Prochlorperazine Edisylate Other (See Comments)    Seizure   . Sumatriptan Palpitations     (Not in a hospital admission)  Results for orders placed during the hospital encounter of 12/14/11 (from the past 48 hour(s))  CBC WITH  DIFFERENTIAL     Status: Normal   Collection Time   12/14/11  6:25 PM      Component Value Range Comment   WBC 6.4  4.0 - 10.5 K/uL    RBC 4.51  3.87 - 5.11 MIL/uL    Hemoglobin 13.7  12.0 - 15.0 g/dL    HCT 16.1  09.6 - 04.5 %    MCV 84.9  78.0 - 100.0 fL    MCH 30.4  26.0 - 34.0 pg    MCHC 35.8  30.0 - 36.0 g/dL    RDW 40.9  81.1 - 91.4 %    Platelets 308  150 - 400 K/uL    Neutrophils Relative 66  43 - 77 %    Neutro Abs 4.2  1.7 - 7.7 K/uL    Lymphocytes Relative 25  12 - 46 %    Lymphs Abs 1.6  0.7 - 4.0 K/uL    Monocytes Relative 8  3 - 12 %    Monocytes Absolute 0.5  0.1 - 1.0 K/uL    Eosinophils Relative 1  0 - 5 %    Eosinophils Absolute 0.1  0.0 - 0.7 K/uL    Basophils Relative 1  0 - 1 %  Basophils Absolute 0.0  0.0 - 0.1 K/uL   BASIC METABOLIC PANEL     Status: Abnormal   Collection Time   12/14/11  6:25 PM      Component Value Range Comment   Sodium 140  135 - 145 mEq/L    Potassium 3.9  3.5 - 5.1 mEq/L    Chloride 106  96 - 112 mEq/L    CO2 22  19 - 32 mEq/L    Glucose, Bld 72  70 - 99 mg/dL    BUN 10  6 - 23 mg/dL    Creatinine, Ser 4.74  0.50 - 1.10 mg/dL    Calcium 8.7  8.4 - 25.9 mg/dL    GFR calc non Af Amer 81 (*) >90 mL/min    GFR calc Af Amer >90  >90 mL/min   POCT I-STAT TROPONIN I     Status: Normal   Collection Time   12/14/11  6:45 PM      Component Value Range Comment   Troponin i, poc 0.01  0.00 - 0.08 ng/mL    Comment 3             Dg Chest 2 View  12/14/2011  *RADIOLOGY REPORT*  Clinical Data: Chest pain and short of breath.  CHEST - 2 VIEW  Comparison: 07/06/2011  Findings: Normal heart size and clear lungs. Stable slight scoliosis.  IMPRESSION: No active cardiopulmonary disease.  Original Report Authenticated By: Donavan Burnet, M.D.    Review of Systems  Constitutional: Negative for fever, chills, weight loss, malaise/fatigue and diaphoresis.  HENT: Negative for hearing loss, ear pain, nosebleeds, neck pain, tinnitus and ear discharge.         From nitro per pt, gone now  Eyes: Negative for blurred vision, double vision, photophobia, pain, discharge and redness.  Respiratory: Positive for shortness of breath. Negative for cough, hemoptysis, sputum production and wheezing.   Cardiovascular: Positive for chest pain and palpitations. Negative for orthopnea, claudication, leg swelling and PND.  Gastrointestinal: Positive for nausea. Negative for heartburn, vomiting, abdominal pain, diarrhea, constipation, blood in stool and melena.  Genitourinary: Negative for dysuria, urgency, frequency, hematuria and flank pain.  Musculoskeletal: Negative for myalgias, back pain, joint pain and falls.  Skin: Negative for itching and rash.  Neurological: Positive for headaches. Negative for dizziness, tingling, tremors, sensory change, speech change, focal weakness, seizures, loss of consciousness and weakness.  Endo/Heme/Allergies: Negative for environmental allergies and polydipsia. Does not bruise/bleed easily.  Psychiatric/Behavioral: Negative for depression, suicidal ideas, hallucinations and substance abuse. The patient is not nervous/anxious and does not have insomnia.     Blood pressure 161/98, pulse 81, temperature 98.4 F (36.9 C), temperature source Oral, resp. rate 10, SpO2 100.00%. Physical Exam  Constitutional: She is oriented to person, place, and time. She appears well-developed and well-nourished. No distress.  HENT:  Head: Normocephalic and atraumatic.  Eyes: Conjunctivae and EOM are normal. Pupils are equal, round, and reactive to light. Right eye exhibits no discharge. Left eye exhibits no discharge.  Neck: Normal range of motion. Neck supple. No JVD present. No tracheal deviation present. No thyromegaly present.  Cardiovascular: Normal rate and regular rhythm.  Exam reveals no gallop and no friction rub.   No murmur heard. Respiratory: Effort normal and breath sounds normal. No stridor. No respiratory distress. She has no  wheezes. She has no rales. She exhibits no tenderness.  GI: Soft. Bowel sounds are normal. She exhibits no distension and no mass. There is no  tenderness. There is no rebound and no guarding.  Musculoskeletal: Normal range of motion. She exhibits no edema and no tenderness.  Lymphadenopathy:    She has no cervical adenopathy.  Neurological: She is alert and oriented to person, place, and time. She displays normal reflexes. No cranial nerve deficit. She exhibits normal muscle tone. Coordination normal.  Skin: Skin is warm and dry. No rash noted. She is not diaphoretic. No erythema. No pallor.  Psychiatric: She has a normal mood and affect. Her behavior is normal. Judgment and thought content normal.     Assessment/Plan Cp with hx of vasospasm Tele, cycle cardiac markers Cardiology consult called [facility], labetolol, lipitor, start NTP  Hyperlipidemia: cont lipitor  Hypertension uncontrolled: cont nifedipine, cont labetolol, start ntp,  Hydralazine prn  H/o ras s/p stent: cont asa  Hypothyroidism: cont levothyroxine  Anxiety: cont duloxetine  DVT prophylaxis: scd Code status: Full Code    Pearson Grippe 12/14/2011, 11:11 PM   Case d/w Armanda Magic, who will notify [facility] to consult in am, appreciate input Npo after MN in case stress testing indicated.

## 2011-12-14 NOTE — ED Provider Notes (Signed)
History     CSN: [id number]  Arrival date & time 12/14/11  1710   First MD Initiated Contact with Patient 12/14/11 1739      Chief Complaint  Patient presents with  . Chest Pain    (Consider location/radiation/quality/duration/timing/severity/associated sxs/prior treatment) Patient is a 51 y.o. female presenting with chest pain. The history is provided by the patient.  Chest Pain The chest pain began 3 - 5 hours ago. Duration of episode(s) is 3 hours. Chest pain occurs constantly. The chest pain is unchanged. Associated with: nothing. The severity of the pain is moderate. The quality of the pain is described as pressure-like and similar to previous episodes. The pain radiates to the left shoulder. Chest pain is worsened by exertion. Primary symptoms include shortness of breath and nausea. Pertinent negatives for primary symptoms include no fever, no fatigue, no cough, no wheezing, no palpitations, no abdominal pain, no vomiting and no dizziness.  Pertinent negatives for associated symptoms include no diaphoresis and no numbness. She tried nothing for the symptoms.  Her past medical history is significant for hyperlipidemia and hypertension.  Pertinent negatives for past medical history include no seizures.  Procedure history is positive for cardiac catheterization.     Past Medical History  Diagnosis Date  . Migraine headache   . Chest pain   . HTN (hypertension)   . Renal artery stenosis   . HLD (hyperlipidemia)   . Fibromyalgia   . CVA (cerebral vascular accident)   . Coronary vasospasm   . Thyroid disease     Past Surgical History  Procedure Date  . Renal artery stent   . Spine surgery   . Abdominal hysterectomy   . Back surgery     Family History  Problem Relation Age of Onset  . Heart attack    . Stroke    . Hypertension Mother   . Hypertension Father   . Hypertension Brother     Died from a massive stroke in setting of severe HTN  . Metabolic syndrome Sister      History  Substance Use Topics  . Smoking status: Never Smoker   . Smokeless tobacco: Never Used  . Alcohol Use: No    OB History    Grav Para Term Preterm Abortions TAB SAB Ect Mult Living                  Review of Systems  Constitutional: Negative for fever, chills, diaphoresis and fatigue.  HENT: Negative for ear pain, congestion, sore throat, facial swelling, mouth sores, trouble swallowing, neck pain and neck stiffness.   Eyes: Negative.   Respiratory: Positive for shortness of breath. Negative for apnea, cough, chest tightness and wheezing.   Cardiovascular: Positive for chest pain. Negative for palpitations and leg swelling.  Gastrointestinal: Positive for nausea. Negative for vomiting, abdominal pain, diarrhea and abdominal distention.  Genitourinary: Negative for hematuria, flank pain, vaginal discharge, difficulty urinating and menstrual problem.  Musculoskeletal: Negative for back pain and gait problem.  Skin: Negative for rash and wound.  Neurological: Negative for dizziness, tremors, seizures, syncope, facial asymmetry, numbness and headaches.  Psychiatric/Behavioral: Negative.   All other systems reviewed and are negative.    Allergies  Hydromorphone hcl; Prochlorperazine edisylate; and Sumatriptan  Home Medications   Current Outpatient Rx  Name Route Sig Dispense Refill  . ASPIRIN EC 81 MG PO TBEC Oral Take 81 mg by mouth daily.    . ATORVASTATIN CALCIUM 40 MG PO TABS Oral Take 1 tablet (  40 mg total) by mouth daily. 30 tablet 6  . CYCLOBENZAPRINE HCL 10 MG PO TABS Oral Take 10 mg by mouth 3 (three) times daily as needed. For spasms    . DULOXETINE HCL 30 MG PO CPEP Oral Take 30 mg by mouth daily.     . ERGOCALCIFEROL 50000 UNITS PO CAPS Oral Take 50,000 Units by mouth once a week. On monday    . LABETALOL HCL 200 MG PO TABS Oral Take 2 tablets (400 mg total) by mouth 2 (two) times daily. 60 tablet 6  . LEVOTHYROXINE SODIUM 125 MCG PO TABS Oral Take 2  tablets (250 mcg total) by mouth daily before breakfast. 60 tablet 6  . LUBIPROSTONE 24 MCG PO CAPS Oral Take 24 mcg by mouth daily as needed. For irritable bowel syndrome w/ constipation    . NIFEDIPINE ER 60 MG PO TB24 Oral Take 2 tablets (120 mg total) by mouth daily. 60 tablet 11  . NITROGLYCERIN 0.4 MG SL SUBL Sublingual Place 0.4 mg under the tongue every 5 (five) minutes as needed. For chest pain    . POTASSIUM CHLORIDE CRYS ER 20 MEQ PO TBCR Oral Take 1 tablet (20 mEq total) by mouth 2 (two) times daily. 30 tablet 6  . ZOLPIDEM TARTRATE 5 MG PO TABS Oral Take 5-10 mg by mouth at bedtime as needed. For sleep      BP 162/100  Pulse 99  Temp 98.4 F (36.9 C) (Oral)  Resp 30  SpO2 100%  Physical Exam  Nursing note and vitals reviewed. Constitutional: She is oriented to person, place, and time. She appears well-developed and well-nourished. No distress.  HENT:  Head: Normocephalic and atraumatic.  Right Ear: External ear normal.  Left Ear: External ear normal.  Nose: Nose normal.  Mouth/Throat: Oropharynx is clear and moist. No oropharyngeal exudate.  Eyes: Conjunctivae and EOM are normal. Pupils are equal, round, and reactive to light. Right eye exhibits no discharge. Left eye exhibits no discharge.  Neck: Normal range of motion. Neck supple. No JVD present. No tracheal deviation present. No thyromegaly present.  Cardiovascular: Normal rate, regular rhythm, normal heart sounds and intact distal pulses.  Exam reveals no gallop and no friction rub.   No murmur heard. Pulmonary/Chest: Effort normal and breath sounds normal. No respiratory distress. She has no wheezes. She has no rales. She exhibits no tenderness.  Abdominal: Soft. Bowel sounds are normal. She exhibits no distension. There is no tenderness. There is no rebound and no guarding.  Musculoskeletal: Normal range of motion.  Lymphadenopathy:    She has no cervical adenopathy.  Neurological: She is alert and oriented to  person, place, and time. No cranial nerve deficit. Coordination normal.  Skin: Skin is warm. No rash noted. She is not diaphoretic.  Psychiatric: She has a normal mood and affect. Her behavior is normal. Judgment and thought content normal.    ED Course  Procedures (including critical care time)  Labs Reviewed  BASIC METABOLIC PANEL - Abnormal; Notable for the following:    GFR calc non Af Amer 81 (*)     All other components within normal limits  CBC WITH DIFFERENTIAL  POCT I-STAT TROPONIN I   Dg Chest 2 View  12/14/2011  *RADIOLOGY REPORT*  Clinical Data: Chest pain and short of breath.  CHEST - 2 VIEW  Comparison: 07/06/2011  Findings: Normal heart size and clear lungs. Stable slight scoliosis.  IMPRESSION: No active cardiopulmonary disease.  Original Report Authenticated By: Marlowe Aschoff  HOSS, M.D.     No diagnosis found.    MDM  51 year old female patient with past medical history of hypertension hyperlipidemia and coronary vasospasm presents with recurrence of chest pain. Patient says this is similar to previous chest pain episodes she's had with her coronary vasospasm. Patient has a history of paroxysmal supraventricular tachycardia. Patient had a coronary catheterization 2 years ago that showed normal coronary arteries and noticed she was diagnosed with coronary vasospasm. Patient had been in normal state of health when this morning she noticed chest heaviness and tightness similar to previous episodes radiating to her left shoulder with shortness of breath diaphoresis and nausea. Description is consistent with angina. Physical exam is otherwise normal as above. We'll screen for ACS.  Results for orders placed during the hospital encounter of 12/14/11  CBC WITH DIFFERENTIAL      Component Value Range   WBC 6.4  4.0 - 10.5 K/uL   RBC 4.51  3.87 - 5.11 MIL/uL   Hemoglobin 13.7  12.0 - 15.0 g/dL   HCT 64.4  03.4 - 74.2 %   MCV 84.9  78.0 - 100.0 fL   MCH 30.4  26.0 - 34.0 pg    MCHC 35.8  30.0 - 36.0 g/dL   RDW 59.5  63.8 - 75.6 %   Platelets 308  150 - 400 K/uL   Neutrophils Relative 66  43 - 77 %   Neutro Abs 4.2  1.7 - 7.7 K/uL   Lymphocytes Relative 25  12 - 46 %   Lymphs Abs 1.6  0.7 - 4.0 K/uL   Monocytes Relative 8  3 - 12 %   Monocytes Absolute 0.5  0.1 - 1.0 K/uL   Eosinophils Relative 1  0 - 5 %   Eosinophils Absolute 0.1  0.0 - 0.7 K/uL   Basophils Relative 1  0 - 1 %   Basophils Absolute 0.0  0.0 - 0.1 K/uL  BASIC METABOLIC PANEL      Component Value Range   Sodium 140  135 - 145 mEq/L   Potassium 3.9  3.5 - 5.1 mEq/L   Chloride 106  96 - 112 mEq/L   CO2 22  19 - 32 mEq/L   Glucose, Bld 72  70 - 99 mg/dL   BUN 10  6 - 23 mg/dL   Creatinine, Ser 4.33  0.50 - 1.10 mg/dL   Calcium 8.7  8.4 - 29.5 mg/dL   GFR calc non Af Amer 81 (*) >90 mL/min   GFR calc Af Amer >90  >90 mL/min  POCT I-STAT TROPONIN I      Component Value Range   Troponin i, poc 0.01  0.00 - 0.08 ng/mL   Comment 3            DG Chest 2 View (Final result)   Result time:12/14/11 1903    Final result by Rad Results In Interface (12/14/11 19:03:45)    Narrative:   *RADIOLOGY REPORT*  Clinical Data: Chest pain and short of breath.  CHEST - 2 VIEW  Comparison: 07/06/2011  Findings: Normal heart size and clear lungs. Stable slight scoliosis.  IMPRESSION: No active cardiopulmonary disease.  Original Report Authenticated By: Donavan Burnet, M.D.     EKG unchanged from previous EKGs. Chest x-ray is normal. Initial troponin and labs are otherwise normal. Attempting to control pain with sublingual nitroglycerin and we'll have to admit patient to hospital for chest pain workup.  Case discussed with Dr.Belfi  Sherryl Manges, MD 12/15/11 [phone]

## 2011-12-14 NOTE — ED Notes (Signed)
Pt states she had some CP but it has resolved. Pt denies any CP at this time.

## 2011-12-14 NOTE — ED Notes (Addendum)
Pt admitted today by EMS. Pt has been experiencing weakness since Saturday. At 1445 today pt began having pressure in her chest radiating to her back and lower neck. Prior to EMS arrival pt took 4 81mg  aspirin. Pt was admitted here in Jan for SVT. PT has had a CVA in the past as well. Pt rates her pain 8/10. On arrival by EMS pt BP was 230/. PT given 2 NTG with no change in pain, however BP was then 176/120. Pt states that she does take her meds as prescribed. She takes Labetalol for HTN. Pt states that she feels this pain occasionally. No SOB. Pt nauseated.

## 2011-12-15 ENCOUNTER — Encounter (HOSPITAL_COMMUNITY): Payer: Self-pay | Admitting: *Deleted

## 2011-12-15 DIAGNOSIS — I201 Angina pectoris with documented spasm: Secondary | ICD-10-CM

## 2011-12-15 DIAGNOSIS — I10 Essential (primary) hypertension: Secondary | ICD-10-CM

## 2011-12-15 DIAGNOSIS — I7789 Other specified disorders of arteries and arterioles: Secondary | ICD-10-CM

## 2011-12-15 LAB — CARDIAC PANEL(CRET KIN+CKTOT+MB+TROPI)
CK, MB: 1.6 ng/mL (ref 0.3–4.0)
CK, MB: 1.6 ng/mL (ref 0.3–4.0)
Relative Index: 1.4 (ref 0.0–2.5)
Relative Index: 1.5 (ref 0.0–2.5)
Total CK: 118 U/L (ref 7–177)
Total CK: 105 U/L (ref 7–177)
Total CK: 104 U/L (ref 7–177)
Troponin I: <0.3 ng/mL (ref <0.30)

## 2011-12-15 LAB — LIPID PANEL
LDL Cholesterol: 98 mg/dL (ref 0–99)
Triglycerides: 102 mg/dL (ref <150)

## 2011-12-15 LAB — COMPREHENSIVE METABOLIC PANEL
ALT: 12 U/L (ref 0–35)
Alkaline Phosphatase: 80 U/L (ref 39–117)
CO2: 24 mEq/L (ref 19–32)
Calcium: 8.4 mg/dL (ref 8.4–10.5)
GFR calc Af Amer: 79 mL/min — ABNORMAL LOW (ref >90)
GFR calc non Af Amer: 68 mL/min — ABNORMAL LOW (ref >90)
Glucose, Bld: 103 mg/dL — ABNORMAL HIGH (ref 70–99)
Potassium: 3.8 mEq/L (ref 3.5–5.1)
Sodium: 139 mEq/L (ref 135–145)

## 2011-12-15 LAB — D-DIMER, QUANTITATIVE: D-Dimer, Quant: 0.22 ug/mL-FEU (ref 0.00–0.48)

## 2011-12-15 LAB — CBC
MCHC: 35.3 g/dL (ref 30.0–36.0)
RDW: 12.4 % (ref 11.5–15.5)

## 2011-12-15 MED ORDER — ONDANSETRON HCL 4 MG/2ML IJ SOLN
4.0000 mg | Freq: Three times a day (TID) | INTRAMUSCULAR | Status: DC | PRN
  Administered 2011-12-15: 4 mg via INTRAVENOUS
  Filled 2011-12-15: qty 2

## 2011-12-15 MED ORDER — ONDANSETRON HCL 4 MG PO TABS: 4.0000 mg | ORAL_TABLET | Freq: Three times a day (TID) | ORAL | Status: DC | PRN

## 2011-12-15 MED ORDER — NIFEDIPINE ER OSMOTIC RELEASE 90 MG PO TB24
180.0000 mg | ORAL_TABLET | Freq: Every day | ORAL | Status: DC
  Administered 2011-12-15 – 2011-12-16 (×2): 180 mg via ORAL
  Filled 2011-12-15 (×2): qty 2

## 2011-12-15 NOTE — Progress Notes (Signed)
Nurse did not start IV fluids until 0554 this am due to patient's BP 201/112 @ admission to the floor. Patient's BP after scheduled dose of labetalol decreased to  125/80. Harmon Pier

## 2011-12-15 NOTE — Progress Notes (Signed)
Patient has a scheduled nitro patch to be administered. Patient would like an order for tylenol, due to nitro headaches. [name] Rowe

## 2011-12-15 NOTE — Consult Note (Signed)
CARDIOLOGY CONSULT NOTE  Patient ID: [name] Rowe, MRN: [id number], DOB/AGE: 1961/06/06 51 y.o. Admit date: 12/14/2011   Date of Consult: 12/15/2011 Primary Physician: Yisroel Ramming, MD Primary Cardiologist: Dr. Shirlee Latch  Chief Complaint: chest pain, DOE  HPI: 51 y/o F with hx of HTN, CVA 2006, fibromuscular dysplasia/RAS, and chest pain with cath in 2009 in [place] showing no angiographic CAD, but evidence of catheter induced RCA spasm so her pain has been felt due to coronary vasospasm vs microsvascular obstruction. She had a negative nuc in 2011. She presented to [facility] with complaints of chest pressure and DOE. She has been under a lot of stress lately and since Saturday 12/11/11 hasn't felt well. She reporst increased fatigue, dizziness that she relates to sensation of "blood rushing to my head," and chest pressure/SOB. Overnight on Saturday she awoke abruptly because she felt like she was being choked and her heart was pounding. She waited for it to pass and eventually fell back asleep. However, since that time she has noticed with any amount of activity she gets more SOB than usual to the point where family is starting to notice. She also notes chest pressure that occurs with exertion that resolves with rest. At times this comes on as a sharp-stabbing sensation that only lasts a few seconds. Her blood pressure has also been running high at home and she is not sure why. She reports compliance with medications. No drugs, EtOH, tobacco, OTC supplements or excess caffeine use. She denies any orthopnea, PND, syncope, or visual changes. She had LEE last week but this has resolved. She is not hypoxic or tachynic. She does state that Toradol earlier helped her pain.  EKG is nonacute & labwork is unrevealing thus far. 1 POC troponin and 2 full set of CE's are negative thus far. She was markedly hypertensive on admission (180/100) with a peak BP overnight of 198/112, which has since come down  to 125/80. She is currently without complaint.  Past Medical History  Diagnosis Date  . Migraine headache   . Chest pain     a. No angiographic CAD by cath 2009 in [place]. There was catheter-induced RCA vasospasm versus microvascular obstruction. bEugenie Birks myoview 04/2010 - EF 79%, normal perfusion.  Marland Kitchen HTN (hypertension)     a. Likely related to RAS; urinary metanephrines and plasma renin/aldosterone ratio normal. b. H/o HTN urgency 06/2011 after being off BP meds for a number of days  . Renal artery stenosis     a. Due to fibromuscular dysplasia. b. Renal stent 2009. c. Studies:  CTA abdomen (1/13) with evidence for fibromuscular dysplasia, right renal artery stent appears patent. Renal artery dopplers (2/13) with FMD but no evidence for signficant stenosis.   Marland Kitchen HLD (hyperlipidemia)   . Fibromyalgia   . CVA (cerebral vascular accident) 2006  . Coronary vasospasm   . Hypothyroidism   . Fibromuscular dysplasia     a. Renal artery stenosis due to this. b. Carotid dopplers 07/2011 c/w FMD but no significant stenosis  . Tachycardia     10/27/10-Holter -NSR with occ sinus tachy-rare PACs, no sig arrhythmia  Clarification on CVA: the patient reports this was in the setting of elevated blood pressure. She states she was treated with blood thinners and was on ASA/Plavix for a period of time but was taken off Plavix due to cost in 2011.  Most Recent Cardiac Studies: 2D Echo 06/2011 Study Conclusions Left ventricle: Wall thickness was increased in a pattern of  mild LVH. Systolic function was normal. The estimated ejection fraction was in the range of 55% to 60%. Wall motion was normal; there were no regional wall motion abnormalities. Doppler parameters are consistent with abnormal left ventricular relaxation (grade 1 diastolic dysfunction). There was no evidence of elevated ventricular filling pressure by Doppler parameters.  Reported cath 2009 in [place] as above  Hospitalizations  this year per Dr. Alford Highland note: "She was admitted twice in 1/13, the first time with a hypertensive urgency (she had been off her BP meds for a number of days). She was re-admitted several days later with orthostatic symptoms and probable rebound tachycardia (she had not picked up her beta blocker prescription after discharge). She was restarted on beta blocker and HCTZ was stopped."   Surgical History:  Past Surgical History  Procedure Date  . Renal artery stent   . Spine surgery   . Abdominal hysterectomy   . Back surgery      Home Meds: Prior to Admission medications   Medication Sig Start Date End Date Taking? Authorizing Provider  aspirin EC 81 MG tablet Take 81 mg by mouth daily.   Yes Historical Provider, MD  atorvastatin (LIPITOR) 40 MG tablet Take 1 tablet (40 mg total) by mouth daily. 09/10/11 09/09/12 Yes Laurey Morale, MD  cyclobenzaprine (FLEXERIL) 10 MG tablet Take 10 mg by mouth 3 (three) times daily as needed. For spasms   Yes Historical Provider, MD  DULoxetine (CYMBALTA) 30 MG capsule Take 30 mg by mouth daily.    Yes Historical Provider, MD  ergocalciferol (VITAMIN D2) 50000 UNITS capsule Take 50,000 Units by mouth once a week. On monday   Yes Historical Provider, MD  labetalol (NORMODYNE) 200 MG tablet Take 2 tablets (400 mg total) by mouth 2 (two) times daily. 07/16/11  Yes Laurey Morale, MD  levothyroxine (SYNTHROID, LEVOTHROID) 125 MCG tablet Take 2 tablets (250 mcg total) by mouth daily before breakfast. 07/01/11 06/30/12 Yes Ok Anis, NP  lubiprostone (AMITIZA) 24 MCG capsule Take 24 mcg by mouth daily as needed. For irritable bowel syndrome w/ constipation   Yes Historical Provider, MD  NIFEdipine (PROCARDIA-XL/ADALAT CC) 60 MG 24 hr tablet Take 2 tablets (120 mg total) by mouth daily. 08/13/11 08/12/12 Yes Beatrice Lecher, PA  nitroGLYCERIN (NITROSTAT) 0.4 MG SL tablet Place 0.4 mg under the tongue every 5 (five) minutes as needed. For chest pain   Yes Historical  Provider, MD  potassium chloride SA (K-DUR,KLOR-CON) 20 MEQ tablet Take 1 tablet (20 mEq total) by mouth 2 (two) times daily. 07/01/11 06/30/12 Yes Ok Anis, NP  zolpidem (AMBIEN) 5 MG tablet Take 5-10 mg by mouth at bedtime as needed. For sleep   Yes Historical Provider, MD    Inpatient Medications:     . acetaminophen  975 mg Oral Once  . aspirin  324 mg Oral Once  . aspirin EC  325 mg Oral Daily  . atorvastatin  40 mg Oral Daily  . DULoxetine  30 mg Oral Daily  . ketorolac  30 mg Intravenous Once  . labetalol  400 mg Oral BID  . levothyroxine  250 mcg Oral QAC breakfast  .  morphine injection  4 mg Intravenous Once  . NIFEdipine  120 mg Oral Daily  . nitroGLYCERIN  1 inch Topical Q8H  . potassium chloride SA  20 mEq Oral BID  . sodium chloride  3 mL Intravenous Q12H  . Vitamin D (Ergocalciferol)  50,000 Units Oral Q  Mon  . DISCONTD: aspirin EC  81 mg Oral Daily    Allergies:  Allergies  Allergen Reactions  . Hydromorphone Hcl Other (See Comments)    Palpitations  . Prochlorperazine Edisylate Other (See Comments)    Seizure   . Sumatriptan Palpitations    History   Social History  . Marital Status: Married    Spouse Name: N/A    Number of Children: 2  . Years of Education: N/A   Occupational History  . worked in a Museum/gallery conservator.     Quit about a yr ago because fo work related injury to left knee   Social History Main Topics  . Smoking status: Never Smoker   . Smokeless tobacco: Never Used  . Alcohol Use: No  . Drug Use: No  . Sexually Active: Not on file   Other Topics Concern  . Not on file   Social History Narrative   Husband is currently in jail for an offence committed in [place] and he is awaiting extradition.     Family History  Problem Relation Age of Onset  . Heart attack    . Stroke    . Hypertension Mother   . Hypertension Father   . Hypertension Brother     Died from a massive stroke in setting of severe HTN at 50  (had had MI at 12)  . Metabolic syndrome Sister      Review of Systems: General: negative for chills, fever, night sweats or weight changes.  Cardiovascular: see above Dermatological: negative for rash Respiratory: negative for cough or wheezing Urologic: negative for hematuria Abdominal: negative for nausea, vomiting, diarrhea, bright red blood per rectum, melena, or hematemesis Neurologic: negative for visual changes, syncope All other systems reviewed and are otherwise negative except as noted above.  Labs:  [facility] 12/15/11 0558 12/15/11 0029  CKTOTAL 105 118  CKMB 1.5 1.6  TROPONINI <0.30 <0.30   Lab Results  Component Value Date   WBC 4.7 12/15/2011   HGB 13.2 12/15/2011   HCT 37.4 12/15/2011   MCV 85.6 12/15/2011   PLT 278 12/15/2011     Lab 12/15/11 0605  NA 139  K 3.8  CL 105  CO2 24  BUN 12  CREATININE 0.96  CALCIUM 8.4  PROT 6.4  BILITOT 0.3  ALKPHOS 80  ALT 12  AST 14  GLUCOSE 103*   Lab Results  Component Value Date   CHOL 178 12/15/2011   HDL 60 12/15/2011   LDLCALC 98 12/15/2011   TRIG 102 12/15/2011   Radiology/Studies:  1. Chest 2 View 12/14/2011  *RADIOLOGY REPORT*  Clinical Data: Chest pain and short of breath.  CHEST - 2 VIEW  Comparison: 07/06/2011  Findings: Normal heart size and clear lungs. Stable slight scoliosis.  IMPRESSION: No active cardiopulmonary disease.  Original Report Authenticated By: Donavan Burnet, M.D.    EKG: NSR 99bpm no acute changes  Physical Exam: Blood pressure 125/80, pulse 87, temperature 98.2 F (36.8 C), temperature source Oral, resp. rate 20, height 5\' 3"  (1.6 m), weight 131 lb 9.8 oz (59.7 kg), SpO2 99.00%. General: Well developed, well nourished hispanic F in no acute distress. Head: Normocephalic, atraumatic, sclera non-icteric, no xanthomas, nares are without discharge.  Neck: Negative for carotid bruits. JVD not elevated. Lungs: Clear bilaterally to auscultation without wheezes, rales, or rhonchi. Breathing  is unlabored. Heart: RRR with S1 S2. No murmurs, rubs, or gallops appreciated. Abdomen: Soft, non-tender, non-distended with normoactive bowel sounds. No hepatomegaly. No  rebound/guarding. No obvious abdominal masses. Msk:  Strength and tone appear normal for age. Extremities: No clubbing or cyanosis. No edema.  Distal pedal pulses are 2+ and equal bilaterally. Neuro: Alert and oriented X 3. Moves all extremities spontaneously. Psych:  Responds to questions appropriately with a normal affect.   Assessment and Plan:   1. Chest pain 2. H/o coronary vasospasm vs coronary microvascular disease 3. Fibromuscular dysplasia of renal artery/RAS 4. Accelerated HTN 5. H/o prior CVA  Ms. Shaune Pollack has some typical symptoms but also atypical features. There is no objective evidence of ischemia thus far. Per discussion with Dr. Johney Frame, will check d-dimer and plan for Myoview tomorrow (nuc lab schedule is full for today). If d-dimer positive, proceed with CTA to r/o PE. Await UDS, last set of CE's. Keep NPO after midnight. Will also increase medical therapy by titrating her nifedipine up to 180mg  daily and follow her blood pressure/symptoms. See below for additional thoughts.   Signed, Dayna Dunn PA-C 12/15/2011, 8:42 AM  I have seen, examined the patient, and reviewed the above assessment and plan.  Changes to above are made where necessary.  Pt well known to Dr Shirlee Latch.  He and I have spoken this am.  The patients chest pain has both typical and atypical features.  At this point, we will order a dDimer.  If negative, then I would not pursue further PTE eval,however if dDimer is significantly elevated, then I would recommend CTA.  Will plan to proceed with [facility] tomorrow. Will titrate nifedipine today.  Consider increasing labetalol if BP remains elevated.  Co Sign: Hillis Range, MD 12/15/2011 11:18 AM

## 2011-12-15 NOTE — Care Management Note (Unsigned)
    Page 1 of 1   12/15/2011     11:14:57 AM   CARE MANAGEMENT NOTE 12/15/2011  Patient:  JISELL, MAJER   Account Number:  [id number]  Date Initiated:  12/15/2011  Documentation initiated by:  SIMMONS,Stephanine Reas  Subjective/Objective Assessment:   ADMITTED WITH CHEST PRESSURE; LIVES AT HOME ALONE; WAS IPTA; USES [facility] AT [place] FOR RX; MEDICAID APPLICATION IN PROCESS PER PT.     Action/Plan:   DISCHARGE PLANNING DISCUSSED AT BEDSIDE.   Anticipated DC Date:  12/16/2011   Anticipated DC Plan:  HOME/SELF CARE      DC Planning Services  CM consult      Choice offered to / List presented to:             Status of service:  In process, will continue to follow Medicare Important Message given?   (If response is "NO", the following Medicare IM given date fields will be blank) Date Medicare IM given:   Date Additional Medicare IM given:    Discharge Disposition:    Per UR Regulation:  Reviewed for med. necessity/level of care/duration of stay  If discussed at Long Length of Stay Meetings, dates discussed:    Comments:  12/15/11  1114  Ala Capri SIMMONS RN, BSN [phone] NCM WILL FOLLOW.

## 2011-12-15 NOTE — ED Provider Notes (Signed)
I saw and evaluated the patient, reviewed the resident's note and I agree with the findings and plan.   Oden Lindaman, MD 12/15/11 1551 

## 2011-12-15 NOTE — Progress Notes (Signed)
[facility] PROGRESS NOTE  [name] Rowe AVW:[id number] DOB: 1960/09/28 DOA: 12/14/2011 PCP: Yisroel Ramming, MD  Brief narrative: 51 yo female with hx of CVA, hypertension, RAS s/p stent c/o chest pain "pressure" associated with "stabbing" in the left shoulder, starting at rest while watching TV. + palpitation, + sob, + nausea, no emesis, denied fever, chills, cough, brbpr, Kasson Lamere stool. In ED ekg nsr at 99, nl axis, no st, t changes. Initial set of cardiac markers negative   Consultants:  cardiology  Procedures:    Antibiotics:    HPI/Subjective: Sitting up in bed eating breakfast. Reports continued intermittent but much improved sharp pain L shldr area relieved with meds. No events during night. NAD  Objective: Filed Vitals:   12/15/11 0000 12/15/11 0116 12/15/11 0201 12/15/11 0408  BP: 198/112 173/115 142/94 125/80  Pulse: 90   87  Temp: 98.4 F (36.9 C)   98.2 F (36.8 C)  TempSrc: Oral   Oral  Resp: 20   20  Height: 5\' 3"  (1.6 m)     Weight: 59.7 kg (131 lb 9.8 oz)     SpO2: 97%   99%    Intake/Output Summary (Last 24 hours) at 12/15/11 1023 Last data filed at 12/15/11 0107  Gross per 24 hour  Intake    243 ml  Output      0 ml  Net    243 ml    Exam:   General:  Awake, alert, oriented x3 well nourished  Cardiovascular: RRR, no MGR, no LEE PPP  Respiratory: normal effort, BSCTAB, no wheeze, rhonci  Abdomen: soft, non-tender, +BS  Data Reviewed: Basic Metabolic Panel:  Lab 12/15/11 8295 12/14/11 1825  NA 139 140  K 3.8 3.9  CL 105 106  CO2 24 22  GLUCOSE 103* 72  BUN 12 10  CREATININE 0.96 0.83  CALCIUM 8.4 8.7  MG -- --  PHOS -- --   Liver Function Tests:  Lab 12/15/11 0605  AST 14  ALT 12  ALKPHOS 80  BILITOT 0.3  PROT 6.4  ALBUMIN 3.5   No results found for this basename: LIPASE:5,AMYLASE:5 in the last 168 hours No results found for this basename: AMMONIA:5 in the last 168 hours CBC:  Lab 12/15/11 0605 12/14/11 1825    WBC 4.7 6.4  NEUTROABS -- 4.2  HGB 13.2 13.7  HCT 37.4 38.3  MCV 85.6 84.9  PLT 278 308   Cardiac Enzymes:  Lab 12/15/11 0558 12/15/11 0029  CKTOTAL 105 118  CKMB 1.5 1.6  CKMBINDEX -- --  TROPONINI <0.30 <0.30   BNP: No components found with this basename: POCBNP:5 CBG: No results found for this basename: GLUCAP:5 in the last 168 hours  No results found for this or any previous visit (from the past 240 hour(s)).   Studies: Dg Chest 2 View  12/14/2011  *RADIOLOGY REPORT*  Clinical Data: Chest pain and short of breath.  CHEST - 2 VIEW  Comparison: 07/06/2011  Findings: Normal heart size and clear lungs. Stable slight scoliosis.  IMPRESSION: No active cardiopulmonary disease.  Original Report Authenticated By: Donavan Burnet, M.D.    Scheduled Meds:    . acetaminophen  975 mg Oral Once  . aspirin  324 mg Oral Once  . aspirin EC  325 mg Oral Daily  . atorvastatin  40 mg Oral Daily  . DULoxetine  30 mg Oral Daily  . ketorolac  30 mg Intravenous Once  . labetalol  400 mg Oral BID  . levothyroxine  250 mcg Oral QAC breakfast  .  morphine injection  4 mg Intravenous Once  . NIFEdipine  180 mg Oral Daily  . nitroGLYCERIN  1 inch Topical Q8H  . potassium chloride SA  20 mEq Oral BID  . sodium chloride  3 mL Intravenous Q12H  . Vitamin D (Ergocalciferol)  50,000 Units Oral Q Mon  . DISCONTD: aspirin EC  81 mg Oral Daily  . DISCONTD: NIFEdipine  120 mg Oral Daily   Continuous Infusions:    . sodium chloride 1,000 mL (12/15/11 0554)     Assessment/Plan: 1. Chest pain : etiology unclear, some atypical features. Much improved.  Appreciate cardiology assistance. Tele with NSR, CE neg to date. Chest xray stable. D-dimer pending. Planning for Myoview 12/16/11. Continue BB, lipitor, nifedipine   2.  HTN : improved control from admission. Continue BB, nifedipine, hydralazine prn  3. H/o prior CVA : stable. Holding asa for now due to procedure  4. Hx RAS: s/p stent. Stable  monitor closely  5. Hypothyroidism: TSH 1/13 7.2. Will  check TSH continue home med  6. Anxiety: stable at baseline. Continue home meds      Code Status: full Family Communication: pt at bedside Disposition Plan: Lives at home alone. Anticipate being able to return there when ready for discharge.    Gwenyth Bender, NP [facility] Pager [phone]  If 7PM-7AM, please contact night-coverage [email or web address] Password [email or web address] 12/15/2011, 10:23 AM   LOS: 1 day

## 2011-12-15 NOTE — Progress Notes (Signed)
Chart reviewed in detail and patient independently examined. Discussed with [name] Smothers, NP. Agree with her exam, assessment and plan.  [name] is a 51 year old woman with a history of hypertension and presented with chest pain. Currently cardiac enzymes are negative as well as d-dimer. Cardiology evaluation is appreciated in Myoview is planned 7/11.

## 2011-12-15 NOTE — Progress Notes (Signed)
Pt has an intolerance to nitroglycerin.  She says she vomits constantly every time she gets it.  Called to Dr. For order for nausea medication.  Pt given zofran and continues to empty her stomach.  She will try to relax with cold wash cloth. Will continue to monitor. Thomas Hoff

## 2011-12-16 ENCOUNTER — Inpatient Hospital Stay (HOSPITAL_COMMUNITY): Payer: Medicaid Other

## 2011-12-16 DIAGNOSIS — E039 Hypothyroidism, unspecified: Secondary | ICD-10-CM

## 2011-12-16 DIAGNOSIS — R079 Chest pain, unspecified: Secondary | ICD-10-CM

## 2011-12-16 LAB — DRUGS OF ABUSE SCREEN W/O ALC, ROUTINE URINE
Barbiturate Quant, Ur: NEGATIVE
Benzodiazepines.: NEGATIVE
Cocaine Metabolites: NEGATIVE
Methadone: NEGATIVE

## 2011-12-16 MED ORDER — TECHNETIUM TC 99M TETROFOSMIN IV KIT
30.0000 | PACK | Freq: Once | INTRAVENOUS | Status: AC | PRN
  Administered 2011-12-16: 30 via INTRAVENOUS

## 2011-12-16 MED ORDER — TECHNETIUM TC 99M TETROFOSMIN IV KIT
10.0000 | PACK | Freq: Once | INTRAVENOUS | Status: AC | PRN
  Administered 2011-12-16: 10 via INTRAVENOUS

## 2011-12-16 NOTE — Progress Notes (Signed)
Went over D/C instructions with pt including medications and follow up appointments.  Pt did not have any questions or concerns.  Parker, Janeka Libman Gail 12/16/2011  

## 2011-12-16 NOTE — Progress Notes (Signed)
[facility] PROGRESS NOTE  [name] Rowe ZOX:[id number] DOB: Oct 18, 1960 DOA: 12/14/2011 PCP: Yisroel Ramming, MD Cardiologist: [name], M.D.  Assessment/Plan: 1. Chest pain: Typical and atypical features. Serial cardiac enzymes have been negative. Patient denies complaints overnight. No further chest pain. Awaiting Myoview results. 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 Continue aspirin, Lipitor. 2. Hypertension: Now very well controlled. Continue labetalol 400 mg by mouth twice a day, nifedipine. 3. Hypothyroidism: Appears stable. Continue Synthroid. 4. Anxiety: Appears stable. Continue Cymbalta.  Code Status: Full code Family Communication: None bedside Disposition Plan: Home if nuclear stress test is negative  Brendia Sacks, MD  [facility] Pager [phone]. If 8PM-8AM, please contact night-coverage at [email or web address], password [email or web address] 12/16/2011, 1:47 PM  LOS: 2 days   Brief narrative: 51 year old woman presented with history of chest pain.  Consultants:  Cardiology  Procedures:  Nuclear medicine stress test:  HPI/Subjective: No further chest pain. No complaints.  Objective: Filed Vitals:   12/16/11 1050 12/16/11 1052 12/16/11 1212 12/16/11 1300  BP: 88/49 91/52 110/73 109/71  Pulse:   88 77  Temp:    98.2 F (36.8 C)  TempSrc:      Resp:    18  Height:      Weight:      SpO2:    99%    Intake/Output Summary (Last 24 hours) at 12/16/11 1347 Last data filed at 12/16/11 1300  Gross per 24 hour  Intake    480 ml  Output    400 ml  Net     80 ml    Exam:   General:  Appears calm and comfortable.  Cardiovascular: Regular rate and rhythm. No murmur, rub, gallop. No lower extremity edema.  Respiratory: Clear to auscultation bilaterally. No wheezes, rales, rhonchi. Normal respiratory effort.  Data Reviewed: Basic Metabolic Panel:  Lab 12/15/11 8295 12/14/11 1825  NA 139 140  K 3.8 3.9  CL 105  106  CO2 24 22  GLUCOSE 103* 72  BUN 12 10  CREATININE 0.96 0.83  CALCIUM 8.4 8.7  MG -- --  PHOS -- --   Liver Function Tests:  Lab 12/15/11 0605  AST 14  ALT 12  ALKPHOS 80  BILITOT 0.3  PROT 6.4  ALBUMIN 3.5   CBC:  Lab 12/15/11 0605 12/14/11 1825  WBC 4.7 6.4  NEUTROABS -- 4.2  HGB 13.2 13.7  HCT 37.4 38.3  MCV 85.6 84.9  PLT 278 308   Cardiac Enzymes:  Lab 12/15/11 1124 12/15/11 0558 12/15/11 0029  CKTOTAL 104 105 118  CKMB 1.6 1.5 1.6  CKMBINDEX -- -- --  TROPONINI <0.30 <0.30 <0.30   Studies: Dg Chest 2 View  12/14/2011  *RADIOLOGY REPORT*  Clinical Data: Chest pain and short of breath.  CHEST - 2 VIEW  Comparison: 07/06/2011  Findings: Normal heart size and clear lungs. Stable slight scoliosis.  IMPRESSION: No active cardiopulmonary disease.  Original Report Authenticated By: Donavan Burnet, M.D.   Scheduled Meds:   . aspirin EC  325 mg Oral Daily  . atorvastatin  40 mg Oral Daily  . DULoxetine  30 mg Oral Daily  . labetalol  400 mg Oral BID  . levothyroxine  250 mcg Oral QAC breakfast  .  morphine injection  4 mg Intravenous Once  . NIFEdipine  180 mg Oral Daily  . potassium chloride SA  20 mEq Oral BID  . sodium chloride  3 mL Intravenous Q12H  . Vitamin D (Ergocalciferol)  50,000 Units Oral Q Mon  . DISCONTD: aspirin  324 mg Oral Once  . DISCONTD: nitroGLYCERIN  1 inch Topical Q8H   Continuous Infusions:   . DISCONTD: sodium chloride 1,000 mL (12/15/11 0554)    Principal Problem:  *Chest pain Active Problems:  HTN (hypertension)  Coronary vasospasm

## 2011-12-16 NOTE — Discharge Summary (Signed)
Physician Discharge Summary  [name] Rowe ZOX:[id number] DOB: 1960-08-14 DOA: 12/14/2011  PCP: Yisroel Ramming, MD Cardiologist: [name], M.D.  Admit date: 12/14/2011 Discharge date: 12/16/2011  Recommendations for Outpatient Follow-up:  1. Followup with your cardiologist in approximately 3 weeks.   Follow-up Information    Follow up with Yisroel Ramming, MD in 1 week.   Contact information:   [address] [place] [address] [phone]       Follow up with [name], MD. Schedule an appointment as soon as possible for a visit in 2 weeks.   Contact information:   [address] [place] [address] [phone]         Discharge Diagnoses:  1. Chest pain 2. Hypertension 3. Anxiety  Discharge Condition: Improved Disposition: Home  Diet recommendation: Heart healthy  History of present illness:  51 year old woman presented to the emergency department with chest pain.  Hospital Course:  [name] was admitted to the medical floor. She ruled out with serial cardiac enzymes. She was seen with cardiology and underwent a nuclear stress test which now suboptimal was negative. Results were discussed with Dr. Johney Frame and patient was deemed stable for discharge. She has had no recurrent pain. Of note her blood pressure is been very well controlled on outpatient medications and no changes were made.  1. Chest pain: Typical and atypical features. Serial cardiac enzymes have been negative. Patient denies complaints overnight. No further chest pain. Awaiting Myoview results. 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 Continue aspirin, Lipitor.  2. Hypertension: Now very well controlled. Continue labetalol 400 mg by mouth twice a day, nifedipine.  3. Hypothyroidism: Appears stable. Continue Synthroid.  4. Anxiety: Appears stable. Continue  Cymbalta.  Consultants:  Cardiology  Procedures:  Nuclear medicine stress test: Exercise Myoview. The patient did not obtain adequate workload. Interpretation is limited with this submaximal effort. The test was clinically positive electrically negative for the workload achieved. With this workload the Myoview scan showed normal perfusion with minimal soft tissue attenuation. There did not appear to be any evidence of significant ischemia or scar but again, this represents a suboptimal stress. LVEF on gating was calculated at 77% with normal wall motion.  Discharge Instructions  Discharge Orders    Future Orders Please Complete By Expires   Diet - low sodium heart healthy      Discharge instructions      Comments:   1. Your cardiac stress test was negative. Nevertheless you should followup with your cardiologist in approximately 3 weeks. Your blood pressure has been well controlled and it is recommended you continue the same medications. 2. Call physician or 911 for recurrent chest pain.   Activity as tolerated - No restrictions        Medication List  As of 12/16/2011  5:52 PM   TAKE these medications         aspirin EC 81 MG tablet   Take 81 mg by mouth daily.      atorvastatin 40 MG tablet   Commonly known as: LIPITOR   Take 1 tablet (40 mg total) by mouth daily.      cyclobenzaprine 10 MG tablet   Commonly known as: FLEXERIL   Take 10 mg by mouth 3 (three) times daily as needed. For spasms      DULoxetine 30 MG capsule   Commonly known as: CYMBALTA   Take 30 mg by mouth daily.  ergocalciferol 50000 UNITS capsule   Commonly known as: VITAMIN D2   Take 50,000 Units by mouth once a week. On monday      labetalol 200 MG tablet   Commonly known as: NORMODYNE   Take 2 tablets (400 mg total) by mouth 2 (two) times daily.      levothyroxine 125 MCG tablet   Commonly known as: SYNTHROID, LEVOTHROID   Take 2 tablets (250 mcg total) by mouth daily before breakfast.       lubiprostone 24 MCG capsule   Commonly known as: AMITIZA   Take 24 mcg by mouth daily as needed. For irritable bowel syndrome w/ constipation      NIFEdipine 60 MG 24 hr tablet   Commonly known as: PROCARDIA-XL/ADALAT CC   Take 2 tablets (120 mg total) by mouth daily.      nitroGLYCERIN 0.4 MG SL tablet   Commonly known as: NITROSTAT   Place 0.4 mg under the tongue every 5 (five) minutes as needed. For chest pain      potassium chloride SA 20 MEQ tablet   Commonly known as: K-DUR,KLOR-CON   Take 1 tablet (20 mEq total) by mouth 2 (two) times daily.      zolpidem 5 MG tablet   Commonly known as: AMBIEN   Take 5-10 mg by mouth at bedtime as needed. For sleep           The results of significant diagnostics from this hospitalization (including imaging, microbiology, ancillary and laboratory) are listed below for reference.    Significant Diagnostic Studies: Dg Chest 2 View  12/14/2011  *RADIOLOGY REPORT*  Clinical Data: Chest pain and short of breath.  CHEST - 2 VIEW  Comparison: 07/06/2011  Findings: Normal heart size and clear lungs. Stable slight scoliosis.  IMPRESSION: No active cardiopulmonary disease.  Original Report Authenticated By: Donavan Burnet, M.D.   Nm Myocar Multi W/spect W/wall Motion / Ef  12/16/2011  Identification:  The patient is a 51 year old with chest pain test to evaluate rule out ischemia  Stress data:  The patient went exercise stress testing using the Bruce protocol.  Baseline EKG showed sinus rhythm 78 beats per minute baseline blood pressure 96/49.  The patient exercised for 7 minutes 8 seconds to a peak heart rate of 129 beats per minute this was 75% percent predicted maximal peak blood pressure was 145/73.  This was 480 pressure rate product of 18,705.  Overall marginal stress response.  The patient complained of 10 out of 10 and chest tightness this resolved after 5 minutes recovery EKG showed no changes to suggest ischemia.  Nuclear data:  The patient was  studied in 1-day rest stress protocol.  She was injected with 10 mCi technetium 99 labeled tetrofosmin at rest, 30 mCi technetium 99 labeled tetrofosmin at stress.  Images were reconstructed the short, vertical, horizontal axes.  In the initial stress images there was minimal thinning noted in the distal anterior wall, really best appreciated and the short axis views.  Otherwise normal perfusion.  In the recovery images there is minimal change in this again just appreciated in the short axis views.  On gating LVEF was calculated at 77% with normal wall motion.  Review of the raw data there is soft tissue (breast overlying the anterior wall.  This most likely explains the soft tissue changes. There is not appear evidence for significant ischemia or scar.  Impression:  Exercise Myoview.  The patient did not obtain adequate workload.  Interpretation is  limited with this submaximal effort. The test was clinically positive electrically negative for the workload achieved.  With this workload the Myoview scan showed normal perfusion with minimal soft tissue attenuation.  There did not appear to be any evidence of significant ischemia or scar but again, this represents a suboptimal stress.  LVEF on gating was calculated at 77% with normal wall motion.  Original Report Authenticated By: [email or web address]   Labs: Basic Metabolic Panel:  Lab 12/15/11 8469 12/14/11 1825  NA 139 140  K 3.8 3.9  CL 105 106  CO2 24 22  GLUCOSE 103* 72  BUN 12 10  CREATININE 0.96 0.83  CALCIUM 8.4 8.7  MG -- --  PHOS -- --   Liver Function Tests:  Lab 12/15/11 0605  AST 14  ALT 12  ALKPHOS 80  BILITOT 0.3  PROT 6.4  ALBUMIN 3.5   CBC:  Lab 12/15/11 0605 12/14/11 1825  WBC 4.7 6.4  NEUTROABS -- 4.2  HGB 13.2 13.7  HCT 37.4 38.3  MCV 85.6 84.9  PLT 278 308   Cardiac Enzymes:  Lab 12/15/11 1124 12/15/11 0558 12/15/11 0029  CKTOTAL 104 105 118  CKMB 1.6 1.5 1.6  CKMBINDEX -- -- --  TROPONINI <0.30 <0.30 <0.30     Principal Problem:  *Chest pain Active Problems:  Anxiety state, unspecified  HTN (hypertension)  Coronary vasospasm  Hypothyroidism   Time coordinating discharge: 25 minutes.  Signed:  Brendia Sacks, MD [facility] 12/16/2011, 5:52 PM

## 2011-12-16 NOTE — Progress Notes (Signed)
SUBJECTIVE: The patient is doing well today.  At this time, she denies chest pain, shortness of breath, or any new concerns.  She reports that she feels back at her baseline.     Marland Kitchen aspirin EC  325 mg Oral Daily  . atorvastatin  40 mg Oral Daily  . DULoxetine  30 mg Oral Daily  . labetalol  400 mg Oral BID  . levothyroxine  250 mcg Oral QAC breakfast  .  morphine injection  4 mg Intravenous Once  . NIFEdipine  180 mg Oral Daily  . potassium chloride SA  20 mEq Oral BID  . sodium chloride  3 mL Intravenous Q12H  . Vitamin D (Ergocalciferol)  50,000 Units Oral Q Mon  . DISCONTD: nitroGLYCERIN  1 inch Topical Q8H      OBJECTIVE: Physical Exam: Filed Vitals:   12/16/11 1050 12/16/11 1052 12/16/11 1212 12/16/11 1300  BP: 88/49 91/52 110/73 109/71  Pulse:   88 77  Temp:    98.2 F (36.8 C)  TempSrc:      Resp:    18  Height:      Weight:      SpO2:    99%    Intake/Output Summary (Last 24 hours) at 12/16/11 1427 Last data filed at 12/16/11 1300  Gross per 24 hour  Intake    480 ml  Output    400 ml  Net     80 ml    Telemetry reveals sinus rhythm  GEN- The patient is well appearing, alert and oriented x 3 today.   Head- normocephalic, atraumatic Eyes-  Sclera clear, conjunctiva pink Ears- hearing intact Oropharynx- clear Neck- supple, no JVP Lymph- no cervical lymphadenopathy Lungs- Clear to ausculation bilaterally, normal work of breathing Heart- Regular rate and rhythm, no murmurs, rubs or gallops, PMI not laterally displaced GI- soft, NT, ND, + BS Extremities- no clubbing, cyanosis, or edema  LABS: Basic Metabolic Panel:  Basename 12/15/11 0605 12/14/11 1825  NA 139 140  K 3.8 3.9  CL 105 106  CO2 24 22  GLUCOSE 103* 72  BUN 12 10  CREATININE 0.96 0.83  CALCIUM 8.4 8.7  MG -- --  PHOS -- --   Liver Function Tests:  [facility] 12/15/11 0605  AST 14  ALT 12  ALKPHOS 80  BILITOT 0.3  PROT 6.4  ALBUMIN 3.5   No results found for this  basename: LIPASE:2,AMYLASE:2 in the last 72 hours CBC:  Basename 12/15/11 0605 12/14/11 1825  WBC 4.7 6.4  NEUTROABS -- 4.2  HGB 13.2 13.7  HCT 37.4 38.3  MCV 85.6 84.9  PLT 278 308   Cardiac Enzymes:  Basename 12/15/11 1124 12/15/11 0558 12/15/11 0029  CKTOTAL 104 105 118  CKMB 1.6 1.5 1.6  CKMBINDEX -- -- --  TROPONINI <0.30 <0.30 <0.30   BNP: No components found with this basename: POCBNP:3 D-Dimer:  Basename 12/15/11 0930  DDIMER 0.22   Hemoglobin A1C: No results found for this basename: HGBA1C in the last 72 hours Fasting Lipid Panel:  Basename 12/15/11 0500  CHOL 178  HDL 60  LDLCALC 98  TRIG 102  CHOLHDL 3.0  LDLDIRECT --   Thyroid Function Tests:  Basename 12/15/11 1124  TSH 1.368  T4TOTAL --  T3FREE --  THYROIDAB --   Anemia Panel: No results found for this basename: VITAMINB12,FOLATE,FERRITIN,TIBC,IRON,RETICCTPCT in the last 72 hours  RADIOLOGY: Dg Chest 2 View  12/14/2011  *RADIOLOGY REPORT*  Clinical Data: Chest pain and short of  breath.  CHEST - 2 VIEW  Comparison: 07/06/2011  Findings: Normal heart size and clear lungs. Stable slight scoliosis.  IMPRESSION: No active cardiopulmonary disease.  Original Report Authenticated By: Donavan Burnet, M.D.    ASSESSMENT AND PLAN:  Principal Problem:  *Chest pain Active Problems:  Anxiety state, unspecified  HTN (hypertension)  Coronary vasospasm  Hypothyroidism  1. Chest pain- resolved  2. H/o coronary vasospasm vs coronary microvascular disease  3. Fibromuscular dysplasia of renal artery/RAS  4. Accelerated HTN  5. H/o prior CVA  Chest pain is resolved.  Workup unremarkable thus far.  Awaiting results of myoview.  If myoview is low risk, she can be discharged today and follow-up with Dr Shirlee Latch in the office in 3-4 weeks.  BP is well controlled on current medicines, suggesting that compliance may possibly be an issue at home.  If low risk myoview, then we will see as needed while  here.   Hillis Range, MD 12/16/2011 2:27 PM

## 2012-02-02 ENCOUNTER — Encounter: Payer: Self-pay | Admitting: Cardiology

## 2012-02-02 ENCOUNTER — Ambulatory Visit (INDEPENDENT_AMBULATORY_CARE_PROVIDER_SITE_OTHER): Payer: Medicaid Other | Admitting: Cardiology

## 2012-02-02 VITALS — BP 168/122 | HR 117 | Ht 63.0 in | Wt 130.0 lb

## 2012-02-02 DIAGNOSIS — M549 Dorsalgia, unspecified: Secondary | ICD-10-CM

## 2012-02-02 DIAGNOSIS — I10 Essential (primary) hypertension: Secondary | ICD-10-CM

## 2012-02-02 DIAGNOSIS — E785 Hyperlipidemia, unspecified: Secondary | ICD-10-CM

## 2012-02-02 MED ORDER — POTASSIUM CHLORIDE CRYS ER 20 MEQ PO TBCR: 20.0000 meq | EXTENDED_RELEASE_TABLET | Freq: Every day | ORAL | Status: DC

## 2012-02-02 MED ORDER — SPIRONOLACTONE 25 MG PO TABS: 25.0000 mg | ORAL_TABLET | Freq: Every day | ORAL | Status: DC

## 2012-02-02 MED ORDER — LABETALOL HCL 200 MG PO TABS: ORAL_TABLET | ORAL | Status: DC

## 2012-02-02 NOTE — Assessment & Plan Note (Signed)
BP is still high.  She did not take meds today, but even with her medication, it is too high.  She has history of renal artery stenosis from fibromuscular dysplasia.  CTA abdomen recently showed patent right renal stent but was unable to rule out obstructive disease on left.  Renal artery dopplers done following this suggested no significant stenosis.  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  - BMET in 2 wks - We will call her for a BP check in 2 wks.  - Followup with PA in 1 month and me in 3 months.

## 2012-02-02 NOTE — Assessment & Plan Note (Signed)
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  Chest pain with exertion certainly is seen with microvascular disease and she is the right demographic.  She had a cath with no angiographic coronary disease in 2009 and Lexiscan myoview in 11/11 showed no evidence for ischemia or infarction.  Repeat myoview in 7/13 showed no ischemia or infarction but was submaximal.  She has not had chest pain in the last few weeks.  She is on nifedipine XL, which should help with vasospasm if this is part of the etiology of her chest pain.

## 2012-02-02 NOTE — Patient Instructions (Addendum)
Increase labetalol to 600mg  two times a day. This will be three 200mg  tablets two times a day.  Start Spironolactone 25mg  daily.  Decrease KCL(potassium) to 20 mEq daily.  Your physician recommends that you return for lab work in: 2 weeks--BMET  You have been referred to [facility] or [facility] to establish with a Primary Care doctor.  Your physician recommends that you schedule a follow-up appointment in: 1 month with Brynda Rim  Your physician recommends that you schedule a follow-up appointment in: 3 months with Dr Shirlee Latch.

## 2012-02-02 NOTE — Assessment & Plan Note (Signed)
Given possible coronary microvascular dysfunction, aim for LDL < 100 with statin.  She was at goal when lipids checked in 7/13.

## 2012-02-02 NOTE — Progress Notes (Signed)
Patient ID: [name] Rowe, female   DOB: 04/02/1961, 51 y.o.   MRN: [id number] PCP: [facility]  51 yo with history of fibromuscular dysplasia of the renal arteries, HTN, and possible coronary vasospasm versus Syndrome X presents for followup.  Patient was admitted to the hospital in [place] in 2009 with severe chest pain.  At that time, she had a left heart cath showing no angiographic coronary disease but the catheter did induce RCA spasm, leading to concern that her chest pain might be coronary vasospasm.  She also had HTN found to be probably related to fibromuscular dysplasia of the renal arteries.  She had a stent placed in the right renal artery in 2009.  Here in [place], she had a negative Lexiscan myoview in 11/11.  She was admitted twice in 1/13, the first time with a hypertensive urgency (she had been off her BP meds for a number of days).  She was re-admitted several days later with orthostatic symptoms and probable rebound tachycardia (she had not picked up her beta blocker prescription after discharge).  She was restarted on beta blocker and HCTZ was stopped.  Carotids in 2/13 showed no significant stenosis and renal artery dopplers in 2/13 showed no significant stenosis.    She was admitted again in 7/13 with atypical chest pain.  She had an ETT-myoview.  This was a submaximal study but showed no ischemia or infarction, EF 77%.  She has not had significant chest pain since then.  BP has been running higher.  She is under a lot of stress (husband in jail, she is starting divorce proceedings).  BP today is 168/122 but she had not taken her medications.  Typically it runs in the 140s/90s-100s.  She is able to walk on flat ground without significant dyspnea.   Labs (10/11): LDL 136, HDL 44, TGs 159, creatinine 1.06, K 3.8 Labs (11/11): LDL 101, HDL 49, LFTs normal, K 4.8, creatinine 0.83 Labs (1/13): K 4.2, creatinine 1.16, urine metanephrines normal, PRA/aldosterone ratio  normal, LDL 156, HDL 56 Labs (7/13): K 3.8, creatinine 0.96, TSH normal, LDL 98, HDL 60  Allergies (verified):  1)  ! Compazine 2)  ! Imitrex  Past Medical History: 1. Migraine headaches 2. Chest pain: No angiographic coronary disease on cath in 2009 in [place].  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  Suspect coronary vasospasm versus microvascular obstruction.  Lexiscan myoview (11/11): EF 79%, normal perfusion.  ETT-myoview (9/13) with EF 77%, no ischemic or infarction but submaximal study.  3. HTN: Renal artery stenosis has played a role. Urinary metanephrines and plasma renin/aldosterone ratio normal.  4. 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  She had a renal artery stent in 2009.  Renal artery dopplers (12/11) suggestive of fibromuscular dysplasia but no significant stenosis. CTA abdomen (1/13) with evidence for fibromuscular dysplasia, right renal artery stent appears patent.  Renal artery dopplers (2/13) with FMD but no evidence for signficant stenosis.  5. Hyperlipidemia 6. Fibromyalgia 7. Echo (1/13) EF 55-60%, grade I diastolic dysfunction. 8. Hypothyroidism 9. Carotid dopplers (2/13): consistent with FMD but no significant stenosis.  10. Fibromuscular dysplasia.      Family History: Mother with MI at 73, Father with MI at 33, brother with MI at 40 and died with stroke at 39  Social History: Moved to [place] from [place] with her husband.  Denies smoking cigarettes or using illegal drugs Alcohol Use - no Drug Use - no  ROS: All systems reviewed and negative except as per  HPI.    Current Outpatient Prescriptions  Medication Sig Dispense Refill  . aspirin EC 81 MG tablet Take 81 mg by mouth daily.      Marland Kitchen atorvastatin (LIPITOR) 40 MG tablet Take 1 tablet (40 mg total) by mouth daily.  30 tablet  6  . cyclobenzaprine (FLEXERIL) 10 MG tablet Take 10 mg by mouth 3 (three) times daily as needed. For spasms      . DULoxetine (CYMBALTA) 30 MG  capsule Take 30 mg by mouth daily.       . ergocalciferol (VITAMIN D2) 50000 UNITS capsule Take 50,000 Units by mouth once a week. On monday      . levothyroxine (SYNTHROID, LEVOTHROID) 125 MCG tablet Take 2 tablets (250 mcg total) by mouth daily before breakfast.  60 tablet  6  . lubiprostone (AMITIZA) 24 MCG capsule Take 24 mcg by mouth daily as needed. For irritable bowel syndrome w/ constipation      . NIFEdipine (PROCARDIA-XL/ADALAT CC) 60 MG 24 hr tablet Take 2 tablets (120 mg total) by mouth daily.  60 tablet  11  . nitroGLYCERIN (NITROSTAT) 0.4 MG SL tablet Place 0.4 mg under the tongue every 5 (five) minutes as needed. For chest pain      . potassium chloride SA (K-DUR,KLOR-CON) 20 MEQ tablet Take 1 tablet (20 mEq total) by mouth daily.  30 tablet  6  . zolpidem (AMBIEN) 5 MG tablet Take 5-10 mg by mouth at bedtime as needed. For sleep      . DISCONTD: labetalol (NORMODYNE) 200 MG tablet Take 2 tablets (400 mg total) by mouth 2 (two) times daily.  60 tablet  6  . DISCONTD: potassium chloride SA (K-DUR,KLOR-CON) 20 MEQ tablet Take 1 tablet (20 mEq total) by mouth 2 (two) times daily.  30 tablet  6  . labetalol (NORMODYNE) 200 MG tablet 3 tablets(total 600mg ) two times a day  180 tablet  6  . spironolactone (ALDACTONE) 25 MG tablet Take 1 tablet (25 mg total) by mouth daily.  30 tablet  6    BP 168/122  Pulse 117  Ht 5\' 3"  (1.6 m)  Wt 130 lb (58.968 kg)  BMI 23.03 kg/m2 General:  Well developed, well nourished, in no acute distress. Neck:  Neck supple, no JVD. No masses, thyromegaly or abnormal cervical nodes. Lungs:  Clear bilaterally to auscultation and percussion. Heart:  Non-displaced PMI, chest non-tender; regular rate and rhythm, S1, S2 without murmurs, rubs. +S4. Carotid upstroke normal, no bruit.  Pedals normal pulses. No edema, no varicosities. Abdomen:  Bowel sounds positive; abdomen soft and non-tender without masses, organomegaly, or hernias noted. No  hepatosplenomegaly. Extremities:  No clubbing or cyanosis. Neurologic:  Alert and oriented x 3. Psych:  Normal affect.

## 2012-02-08 ENCOUNTER — Telehealth: Payer: Self-pay | Admitting: Cardiology

## 2012-02-08 NOTE — Telephone Encounter (Signed)
Spoke with pt. Pt states she started spironolactone Friday 02/04/12. Pt developed headache on Saturday. Pt states BP on Saturday 139/97 and 148/116. Other BP readings Sunday and Monday-- 149/111 169/115 142/106 155/102.Pt states she also has developed more muscle aches and weakness and it seems her neck is swollen-she has fibromyalgia. She also states  problems urinating since taking spironolactone--she  seems to have to push hard to get her urine started.  BP 115/62 today. Pt did not take spironolactone today. I will forward to Dr Shirlee Latch for review and recommendations.

## 2012-02-08 NOTE — Telephone Encounter (Signed)
Would try spironolactone longer if she thinks she can tolerate, if not will review regimen and try another med.

## 2012-02-08 NOTE — Telephone Encounter (Signed)
Spoke with pt. She is willing to try spironolactone a little longer. She will call me back if she cannot tolerate

## 2012-02-08 NOTE — Telephone Encounter (Signed)
Spoke with pt. Pt states she was on lisinopril in the past. It was stopped because she was hospitalized with an extremely high potassium. I will forward to Dr Shirlee Latch for further review.

## 2012-02-08 NOTE — Telephone Encounter (Signed)
Pt started taking Spironolactone 25mg , pt's muscles aching, headache, trouble voiding, pls advise [phone]

## 2012-02-08 NOTE — Telephone Encounter (Signed)
She took spironolactone in the past without problems.  Headache may be from high BP.  If she does not want to take, start lisinopril 10 mg daily with BMET in 2 wks instead.

## 2012-02-16 ENCOUNTER — Other Ambulatory Visit (INDEPENDENT_AMBULATORY_CARE_PROVIDER_SITE_OTHER): Payer: Medicaid Other

## 2012-02-16 DIAGNOSIS — I10 Essential (primary) hypertension: Secondary | ICD-10-CM

## 2012-02-16 LAB — BASIC METABOLIC PANEL
BUN: 18 mg/dL (ref 6–23)
CO2: 26 mEq/L (ref 19–32)
Calcium: 9.2 mg/dL (ref 8.4–10.5)
Creatinine, Ser: 0.7 mg/dL (ref 0.4–1.2)
GFR: 88.14 mL/min (ref >60.00)
Glucose, Bld: 99 mg/dL (ref 70–99)
Sodium: 137 mEq/L (ref 135–145)

## 2012-02-16 LAB — HEPATIC FUNCTION PANEL
ALT: 17 U/L (ref 0–35)
AST: 17 U/L (ref 0–37)
Alkaline Phosphatase: 80 U/L (ref 39–117)
Bilirubin, Direct: 0.1 mg/dL (ref 0.0–0.3)
Total Bilirubin: 0.8 mg/dL (ref 0.3–1.2)

## 2012-02-16 LAB — LIPID PANEL: Total CHOL/HDL Ratio: 4

## 2012-02-18 ENCOUNTER — Other Ambulatory Visit: Payer: Self-pay | Admitting: *Deleted

## 2012-02-18 DIAGNOSIS — E785 Hyperlipidemia, unspecified: Secondary | ICD-10-CM

## 2012-02-18 DIAGNOSIS — I10 Essential (primary) hypertension: Secondary | ICD-10-CM

## 2012-03-02 ENCOUNTER — Encounter: Payer: Self-pay | Admitting: Physician Assistant

## 2012-03-02 ENCOUNTER — Ambulatory Visit (INDEPENDENT_AMBULATORY_CARE_PROVIDER_SITE_OTHER): Payer: Medicaid Other | Admitting: Physician Assistant

## 2012-03-02 VITALS — BP 144/92 | HR 80 | Ht 63.0 in | Wt 134.8 lb

## 2012-03-02 DIAGNOSIS — R079 Chest pain, unspecified: Secondary | ICD-10-CM

## 2012-03-02 DIAGNOSIS — I10 Essential (primary) hypertension: Secondary | ICD-10-CM

## 2012-03-02 NOTE — Patient Instructions (Addendum)
Your physician recommends that you schedule a follow-up appointment in: 2 WEEKS WITH SCOTT WEAVER, PAC SAME DAY DR. Shirlee Latch IS IN THE OFFICE  Your physician recommends that you return for lab work in: TODAY BMET, TSH,  24 HOUR URINE WITH METANEPHRINE'S, CATECHOLAMINES, VMA   PLEASE MAKE NURSE VISIT FOR 1 WEEK FOR BLOOD PRESSURE CHECK; PLEASE BRING YOUR BP CUFF FROM HOME

## 2012-03-02 NOTE — Progress Notes (Signed)
[address] [place], [place]  [address] Phone: [phone] Fax:  [phone]  Date:  03/02/2012   Name:  [name] Rowe   DOB:  03/02/1961   MRN:  [id number]  PCP:  Yisroel Ramming, MD  Primary Cardiologist:  Dr. [name]  Primary Electrophysiologist:  None    History of Present Illness: [name] Rowe is a 51 y.o. female who returns for follow up.  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  Patient was admitted to the hospital in [place] in 2009 with severe chest pain. At that time, she had a left heart cath showing no angiographic coronary disease but the catheter did induce RCA spasm, leading to concern that her chest pain might be coronary vasospasm. She also had HTN found to be probably related to fibromuscular dysplasia of the renal arteries. She had a stent placed in the right renal artery in 2009. Here in [place], she had a negative Lexiscan myoview in 11/11.   She was admitted twice in 1/13, the first time with a hypertensive urgency (she had been off her BP meds for a number of days). She was re-admitted several days later with orthostatic symptoms and probable rebound tachycardia (she had not picked up her beta blocker prescription after discharge). She was restarted on beta blocker and HCTZ was stopped. Carotids in 2/13 showed no significant stenosis and renal artery dopplers in 2/13 showed no significant stenosis.   She was admitted again in 7/13 with atypical chest pain. She had an ETT-myoview. This was a submaximal study but showed no ischemia or infarction, EF 77%.    She saw Dr. Shirlee Latch in followup 02/02/12. Blood pressure was still running high. Recent renal artery Dopplers were noted to be stable. Labetalol was increased and spironolactone was added. Patient did call in with concerns about taking spironolactone due to headaches. Lisinopril was suggested but she noted  intolerances to this in the past due to hyperkalemia. She agreed to continue on with spironolactone.  Today, she notes that she's had 2 episodes of chest discomfort. These both awoke her from sleep. They were sharp and lasted several minutes. She took nitroglycerin with relief. She denies associated shortness of breath, nausea or syncope. She was somewhat diaphoretic. She denies exertional chest pain. She notes class 1-2 dyspnea. She denies orthopnea or significant pedal edema. She brings in a list of her blood pressures. These range 68/47 up to 163/109. She does describe weakness and lightheadedness when her blood pressure is low.   Labs (10/11): LDL 136, HDL 44, TGs 159, creatinine 1.06, K 3.8  Labs (11/11): LDL 101, HDL 49, LFTs normal, K 4.8, creatinine 0.83  Labs (1/13): K 4.2, creatinine 1.16, urine metanephrines normal, PRA/aldosterone ratio normal, LDL 156, HDL 56  Labs (7/13): K 3.8, creatinine 0.96, TSH normal, LDL 98, HDL 60 Labs (9/13): K 4.3, creatinine 0.7, direct LDL 165.6 (Atorvastatin restarted)  Wt Readings from Last 3 Encounters:  03/02/12 134 lb 12.8 oz (61.145 kg)  02/02/12 130 lb (58.968 kg)  12/15/11 131 lb 9.8 oz (59.7 kg)     Past Medical History  Diagnosis Date  . Migraine headache   . Chest pain     a. No angiographic CAD by cath 2009 in [place]. There was catheter-induced RCA vasospasm versus microvascular obstruction. bEugenie Birks myoview 04/2010 - EF 79%, normal perfusion.;  c. ETT-Myoview 7/13: no ischemia, EF 77%, submax exercise  .  HTN (hypertension)     a. Likely related to RAS; urinary metanephrines and plasma renin/aldosterone ratio normal. b. H/o HTN urgency 06/2011 after being off BP meds for a number of days  . Renal artery stenosis     a. Due to fibromuscular dysplasia. b. Renal stent 2009. c. Studies:  CTA abdomen (1/13) with evidence for fibromuscular dysplasia, right renal artery stent appears patent. Renal artery dopplers (2/13) with FMD but no  evidence for signficant stenosis.   Marland Kitchen HLD (hyperlipidemia)   . Fibromyalgia   . CVA (cerebral vascular accident) 2006  . Coronary vasospasm   . Hypothyroidism   . Fibromuscular dysplasia     a. Renal artery stenosis due to this. b. Carotid dopplers 07/2011 c/w FMD but no significant stenosis  . Tachycardia     10/27/10-Holter -NSR with occ sinus tachy-rare PACs, no sig arrhythmia  . Hx of echocardiogram     a. Echo (1/13): EF 55-60%, grade 1 diastolic dysfunction    Current Outpatient Prescriptions  Medication Sig Dispense Refill  . aspirin EC 81 MG tablet Take 81 mg by mouth daily.      Marland Kitchen atorvastatin (LIPITOR) 40 MG tablet Take 1 tablet (40 mg total) by mouth daily.  30 tablet  6  . cyclobenzaprine (FLEXERIL) 10 MG tablet Take 10 mg by mouth 3 (three) times daily as needed. For spasms      . DULoxetine (CYMBALTA) 30 MG capsule Take 30 mg by mouth daily.       . ergocalciferol (VITAMIN D2) 50000 UNITS capsule Take 50,000 Units by mouth once a week. On monday      . labetalol (NORMODYNE) 200 MG tablet 3 tablets(total 600mg ) two times a day  180 tablet  6  . levothyroxine (SYNTHROID, LEVOTHROID) 125 MCG tablet Take 200 mcg by mouth daily before breakfast.      . lubiprostone (AMITIZA) 24 MCG capsule Take 24 mcg by mouth daily as needed. For irritable bowel syndrome w/ constipation      . NIFEdipine (PROCARDIA-XL/ADALAT CC) 60 MG 24 hr tablet Take 2 tablets (120 mg total) by mouth daily.  60 tablet  11  . nitroGLYCERIN (NITROSTAT) 0.4 MG SL tablet Place 0.4 mg under the tongue every 5 (five) minutes as needed. For chest pain      . potassium chloride SA (K-DUR,KLOR-CON) 20 MEQ tablet Take 1 tablet (20 mEq total) by mouth daily.  30 tablet  6  . spironolactone (ALDACTONE) 25 MG tablet Take 1 tablet (25 mg total) by mouth daily.  30 tablet  6  . zolpidem (AMBIEN) 5 MG tablet Take 5-10 mg by mouth at bedtime as needed. For sleep      . DISCONTD: levothyroxine (SYNTHROID, LEVOTHROID) 125 MCG  tablet Take 2 tablets (250 mcg total) by mouth daily before breakfast.  60 tablet  6    Allergies: Allergies  Allergen Reactions  . Hydromorphone Hcl Other (See Comments)    Palpitations  . Prochlorperazine Edisylate Other (See Comments)    Seizure   . Sumatriptan Palpitations    History  Substance Use Topics  . Smoking status: Never Smoker   . Smokeless tobacco: Never Used  . Alcohol Use: No     ROS:  Please see the history of present illness.   She denies pleuritic chest pain or chest pain with lying supine. She denies fevers or cough. She denies dysphagia or odynophagia. She denies belching. She denies diarrhea. She denies melena or hematochezia.  All other systems reviewed  and negative.   PHYSICAL EXAM: VS:  BP 144/92  Pulse 80  Ht 5\' 3"  (1.6 m)  Wt 134 lb 12.8 oz (61.145 kg)  BMI 23.88 kg/m2  Repeat blood pressure by me 160/110 on the right  Well nourished, well developed, in no acute distress HEENT: normal Neck: no JVD Cardiac:  normal S1, S2; RRR; no murmur Lungs:  clear to auscultation bilaterally, no wheezing, rhonchi or rales Abd: soft, nontender, no hepatomegaly, no bruits Ext: no edema Skin: warm and dry Neuro:  CNs 2-12 intact, no focal abnormalities noted  EKG:  Sinus rhythm, heart rate 80, normal axis, no ischemic changes      ASSESSMENT AND PLAN:  1. Hypertension: Her blood pressure is difficult to control. She's had intolerances to multiple medications in the past. She's on maximum dose of nifedipine. I'm not certain that she could tolerate much more labetalol. She is now on spironolactone. She apparently had hyperkalemia while taking lisinopril in the past. I suggested adding isosorbide as she did have chest pain awaken her from sleep recently. She does have a history of possible coronary vasospasm. However, she notes significant intolerance to nitrates in the past due to headaches. Other options for her include clonidine as well as hydralazine.  However, she notes significant blood pressure drops. She seems to be symptomatic with this. I cannot be certain that her blood pressure machine at home is working correctly. However, before adjusting her medications I have suggested that she come in to see the nurse for blood pressure check with her blood pressure machine. She will then keep an eye on her blood pressures over the next 2 weeks by checking it 3 times a day. She will bring this list in with her when she sees me back in 2 weeks. I have recommended proceeding with a 24-hour urine to assess for adrenaline secreting tumors. However, I realized that the patient had this performed back in January 2013 after she left the office. We will cancel this test.  I will check a basic metabolic panel and TSH today to  2. Chest Pain: I reviewed her case with Dr. Shirlee Latch. At this point, we do not believe that she needs further ischemic evaluation. Her symptoms are somewhat atypical.  Signed, Tereso Newcomer, PA-C  8:53 AM 03/02/2012

## 2012-03-03 ENCOUNTER — Telehealth: Payer: Self-pay | Admitting: *Deleted

## 2012-03-03 LAB — BASIC METABOLIC PANEL
BUN: 12 mg/dL (ref 6–23)
CO2: 24 mEq/L (ref 19–32)
Calcium: 9.1 mg/dL (ref 8.4–10.5)
Glucose, Bld: 78 mg/dL (ref 70–99)
Sodium: 138 mEq/L (ref 135–145)

## 2012-03-03 NOTE — Telephone Encounter (Signed)
pt notifed of lab results today and also dioes not have to continue w/24 hour urine per Bing Neighbors. PAC. pt gave verbal understanding

## 2012-03-03 NOTE — Telephone Encounter (Signed)
lmom pt does not need to complete the 24 hour urine that was ordered yesterday, due to [employer]. PA saw that pt had done recently in the past and these were normal.

## 2012-03-03 NOTE — Telephone Encounter (Signed)
Message copied by Tarri Fuller on Fri Mar 03, 2012  1:45 PM ------      Message from: [place], [place] T      Created: Fri Mar 03, 2012  1:42 PM       Please notify patient that the lab results are ok.      Tereso Newcomer, PA-C  1:42 PM 03/03/2012

## 2012-03-15 ENCOUNTER — Encounter: Payer: Self-pay | Admitting: Physician Assistant

## 2012-03-15 ENCOUNTER — Ambulatory Visit (INDEPENDENT_AMBULATORY_CARE_PROVIDER_SITE_OTHER): Payer: Medicaid Other | Admitting: Physician Assistant

## 2012-03-15 VITALS — BP 126/92 | HR 83 | Ht 63.0 in | Wt 136.4 lb

## 2012-03-15 DIAGNOSIS — E039 Hypothyroidism, unspecified: Secondary | ICD-10-CM

## 2012-03-15 DIAGNOSIS — I10 Essential (primary) hypertension: Secondary | ICD-10-CM

## 2012-03-15 MED ORDER — LABETALOL HCL 200 MG PO TABS: ORAL_TABLET | ORAL | Status: DC

## 2012-03-15 MED ORDER — LEVOTHYROXINE SODIUM 125 MCG PO TABS: 200.0000 ug | ORAL_TABLET | Freq: Every day | ORAL | Status: DC

## 2012-03-15 NOTE — Progress Notes (Signed)
[address] [place], [place]  [address] Phone: [phone] Fax:  [phone]  Date:  03/15/2012   Name:  [name] Rowe   DOB:  [birth year]   MRN:  [id number]  PCP:  Yisroel Ramming, MD  Primary Cardiologist:  Dr. [name]  Primary Electrophysiologist:  None    History of Present Illness: [name] Rowe is a 51 y.o. female who returns for follow up on BP.  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  Patient was admitted to the hospital in [place] in 2009 with severe chest pain. At that time, she had a left heart cath showing no angiographic coronary disease but the catheter did induce RCA spasm, leading to concern that her chest pain might be coronary vasospasm. She also had HTN found to be probably related to fibromuscular dysplasia of the renal arteries. She had a stent placed in the right renal artery in 2009. Here in [place], she had a negative Lexiscan myoview in 11/11.   She was admitted twice in 1/13, the first time with a hypertensive urgency (she had been off her BP meds for a number of days). She was re-admitted several days later with orthostatic symptoms and probable rebound tachycardia (she had not picked up her beta blocker prescription after discharge). She was restarted on beta blocker and HCTZ was stopped. Carotids in 2/13 showed no significant stenosis and renal artery dopplers in 2/13 showed no significant stenosis.   She was admitted again in 7/13 with atypical chest pain. She had an ETT-myoview. This was a submaximal study but showed no ischemia or infarction, EF 77%.    She was seen by Dr. [name] recently with adjustments in her BP meds and planned follow up with me.  When I saw her, her blood pressure remained elevated. She also complained of episodes of chest pain. She also complained of labile blood pressures ranging from 60/47 up to 163/109. I asked her to come  in to have her blood pressure machine checked (which she did not do) and to keep a blood pressure diary and followup today. I considered adding isosorbide but she noted intolerance to this in the past. I discussed her case with Dr. Shirlee Latch. We did not feel that further ischemic evaluation was necessary. Of note, prior 24 urine for metanephrines was normal.  She notes higher BPs in the evenings.  She seems to note palpitations when her BP goes up.  She notes BPs up in the 160s.  No chest pain.  No dyspnea. No syncope.  She has mild pedal edema in the evenings that is resolved in the mornings.     Labs (10/11): LDL 136, HDL 44, TGs 159, creatinine 1.06, K 3.8  Labs (11/11): LDL 101, HDL 49, LFTs normal, K 4.8, creatinine 0.83  Labs (1/13): K 4.2, creatinine 1.16, urine metanephrines normal, PRA/aldosterone ratio normal, LDL 156, HDL 56  Labs (7/13): K 3.8, creatinine 0.96, TSH normal, LDL 98, HDL 60 Labs (9/13): K 4.3, creatinine 0.7, direct LDL 165.6 (Atorvastatin restarted), TSH 1.33   Wt Readings from Last 3 Encounters:  03/02/12 134 lb 12.8 oz (61.145 kg)  02/02/12 130 lb (58.968 kg)  12/15/11 131 lb 9.8 oz (59.7 kg)     Past Medical History  Diagnosis Date  . Migraine headache   . Chest pain     a. No angiographic CAD by cath 2009 in [place]. There was catheter-induced  RCA vasospasm versus microvascular obstruction. bEugenie Birks myoview 04/2010 - EF 79%, normal perfusion.;  c. ETT-Myoview 7/13: no ischemia, EF 77%, submax exercise  . HTN (hypertension)     a. Likely related to RAS; urinary metanephrines and plasma renin/aldosterone ratio normal. b. H/o HTN urgency 06/2011 after being off BP meds for a number of days  . Renal artery stenosis     a. Due to fibromuscular dysplasia. b. Renal stent 2009. c. Studies:  CTA abdomen (1/13) with evidence for fibromuscular dysplasia, right renal artery stent appears patent. Renal artery dopplers (2/13) with FMD but no evidence for signficant  stenosis.   Marland Kitchen HLD (hyperlipidemia)   . Fibromyalgia   . CVA (cerebral vascular accident) 2006  . Coronary vasospasm   . Hypothyroidism   . Fibromuscular dysplasia     a. Renal artery stenosis due to this. b. Carotid dopplers 07/2011 c/w FMD but no significant stenosis  . Tachycardia     10/27/10-Holter -NSR with occ sinus tachy-rare PACs, no sig arrhythmia  . Hx of echocardiogram     a. Echo (1/13): EF 55-60%, grade 1 diastolic dysfunction    Current Outpatient Prescriptions  Medication Sig Dispense Refill  . aspirin EC 81 MG tablet Take 81 mg by mouth daily.      Marland Kitchen atorvastatin (LIPITOR) 40 MG tablet Take 1 tablet (40 mg total) by mouth daily.  30 tablet  6  . cyclobenzaprine (FLEXERIL) 10 MG tablet Take 10 mg by mouth 3 (three) times daily as needed. For spasms      . DULoxetine (CYMBALTA) 30 MG capsule Take 30 mg by mouth daily.       . ergocalciferol (VITAMIN D2) 50000 UNITS capsule Take 50,000 Units by mouth once a week. On monday      . labetalol (NORMODYNE) 200 MG tablet 3 tablets(total 600mg ) two times a day  180 tablet  6  . levothyroxine (SYNTHROID, LEVOTHROID) 125 MCG tablet Take 200 mcg by mouth daily before breakfast.      . lubiprostone (AMITIZA) 24 MCG capsule Take 24 mcg by mouth daily as needed. For irritable bowel syndrome w/ constipation      . NIFEdipine (PROCARDIA-XL/ADALAT CC) 60 MG 24 hr tablet Take 2 tablets (120 mg total) by mouth daily.  60 tablet  11  . nitroGLYCERIN (NITROSTAT) 0.4 MG SL tablet Place 0.4 mg under the tongue every 5 (five) minutes as needed. For chest pain      . potassium chloride SA (K-DUR,KLOR-CON) 20 MEQ tablet Take 1 tablet (20 mEq total) by mouth daily.  30 tablet  6  . spironolactone (ALDACTONE) 25 MG tablet Take 1 tablet (25 mg total) by mouth daily.  30 tablet  6  . zolpidem (AMBIEN) 5 MG tablet Take 5-10 mg by mouth at bedtime as needed. For sleep        Allergies: Allergies  Allergen Reactions  . Hydromorphone Hcl Other (See  Comments)    Palpitations  . Prochlorperazine Edisylate Other (See Comments)    Seizure   . Sumatriptan Palpitations    History  Substance Use Topics  . Smoking status: Never Smoker   . Smokeless tobacco: Never Used  . Alcohol Use: No     ROS:  Please see the history of present illness.   No facial droop, headaches, speech difficulty.    All other systems reviewed and negative.   PHYSICAL EXAM: VS:  BP 126/92  Pulse 83  Ht 5\' 3"  (1.6 m)  Wt 136 lb  6.4 oz (61.871 kg)  BMI 24.16 kg/m2  Well nourished, well developed, in no acute distress HEENT: normal Neck: no JVD Cardiac:  normal S1, S2; RRR; no murmur Lungs:  clear to auscultation bilaterally, no wheezing, rhonchi or rales Abd: soft, nontender, no hepatomegaly, no bruits Ext: no edema Skin: warm and dry Neuro:  CNs 2-12 intact, no focal abnormalities noted   EKG:  NSR, HR 83, normal axis, no acute changes       ASSESSMENT AND PLAN:  1. Hypertension:  BP close to optimal today.  Based upon her readings and symptoms at home, I have suggested we increase her Labetalol (max dose is 2400 mg per day).  So, I will have her increase Labetalol to 800 mg BID.  I have suggested she take her Nifedipine 2-3 hours after morning Labetalol dose to have more overlap through the day.  Her diet is rich in salt.  I will give her a low salt diet to follow. I have asked her to cut back on this.  2. Hypothyroidism:  She is out of her synthroid.  She is waiting on an appt with the [facility].  I will refill Synthroid for her.  Recent TSH ok.  Signed, Tereso Newcomer, PA-C  8:31 AM 03/15/2012

## 2012-03-15 NOTE — Patient Instructions (Addendum)
Your physician has recommended you make the following change in your medication: INCREASE LABETOLOL 800 MG TWICE DAILY TAKE 4 TABS @ 7 AM; 4 TABS AT 7 PM  START TAKING NIFEDIPINE AROUND 9-10 AM  CALL DR. Alford Highland NURSE ANNE LANKFORD IN ABOUT 3-4 WEEKS WITH BLOOD PRESSURE READINGS [phone]  KEEP FOLLOW UP APPT WITH DR. Shirlee Latch 05/15/12 3 to 4 Gram Sodium Diet, No Added Salt (NAS) A 3 to 4 gram sodium diet restricts the amount of sodium in the diet to no more than 3 to 4 g or 3000 to 4000 mg daily. Limiting the amount of sodium is often used to help lower blood pressure. It is important if you have heart, liver, or kidney problems. Many foods contain sodium for flavor and sometimes as a preservative. When the amount of sodium in a diet needs to be low, it is important to know what to look for when choosing foods and drinks. The following includes some information and guidelines to help make it easier for you to adapt to a low sodium diet. QUICK TIPS  Do not add salt to food.  Avoid convenience items and fast food.  Choose unsalted snack foods.  Buy lower sodium products, often labeled as "lower sodium" or "no salt added."  Check food labels to learn how much sodium is in 1 serving.  When eating at a restaurant, ask that your food be prepared with less salt or none, if possible. READING FOOD LABELS FOR SODIUM INFORMATION The nutrition facts label is a good place to find how much sodium is in foods. Look for products with no more than 500 to 600 mg of sodium per meal and no more than 150 mg per serving. Remember that 3 to 4 g = 3000 to 4000 mg. The food label may also list foods as:  Sodium-free: Less than 5 mg in a serving.  Very low sodium: 35 mg or less in a serving.  Low-sodium: 140 mg or less in a serving.  Light in sodium: 50% less sodium in a serving. For example, if a food that usually has 300 mg of sodium is changed to become light in sodium, it will have 150 mg of  sodium.  Reduced sodium: 25% less sodium in a serving. For example, if a food that usually has 400 mg of sodium is changed to reduced sodium, it will have 300 mg of sodium. CHOOSING FOODS Grains  Avoid: Salted crackers and snack items. Bread stuffing and biscuit mixes. Seasoned rice or pasta mixes.  Choose: Unsalted snack items. English muffins, breads, and rolls. Homemade pancakes and waffles. Most cereals. Pasta. Meats  Avoid: Salted, canned, smoked, spiced, pickled meats, including fish and poultry. Bacon, ham, sausage, cold cuts, hot dogs, anchovies.  Choose: Low-sodium canned tuna and salmon. Fresh or frozen meat, poultry, and fish. Dairy  Avoid: Processed cheese and spreads. Cottage cheese. Buttermilk and condensed milk. Regular cheese.  Choose: Milk. Low-sodium cottage cheese. Yogurt. Sour cream. Low-sodium cheese. Fruits and Vegetables  Avoid: Regular canned vegetables. Regular canned tomato sauce and paste. Frozen vegetables in sauces. Olives. Rosita Fire. Relishes. Sauerkraut.  Choose: Low-sodium canned vegetables. Low-sodium tomato sauce and paste. Frozen or fresh vegetables. Fresh and frozen fruit. Condiments  Avoid: Canned and packaged gravies. Worcestershire sauce. Tartar sauce. Barbecue sauce. Soy sauce. Steak sauce. Ketchup. Onion, garlic, and table salt. Meat flavorings and tenderizers.  Choose: Fresh and dried herbs and spices. Low-sodium varieties of mustard and ketchup. Lemon juice. Tabasco sauce. Horseradish. SAMPLE 3 TO 4  GRAM SODIUM MEAL PLAN  Breakfast / Sodium (mg)  1 cup low-fat milk / 143 mg  2 slices whole-wheat toast / 270 mg  1 tbs heart-healthy margarine / 153 mg  1 hard-boiled egg / 139 mg Lunch / Sodium (mg)  1 cup raw carrots / 76 mg   cup hummus / 298 mg  1 cup low-fat milk / 143 mg   cup red grapes / 2 mg  1 cup low-sodium chicken and rice soup / 480 mg  10 low-sodium saltine crackers / 191 mg Dinner / Sodium (mg)  1 cup  whole-wheat pasta / 2 mg  1 cup tomato sauce / 1178 mg  3 oz lean ground beef / 57 mg  1 small side salad (1 cup raw spinach leaves,  cup cucumber,  cup yellow bell pepper) / 25 mg  1 tsp ranch dressing / 144 mg Snack / Sodium (mg)  1 slice cheddar cheese / 258 mg  1 medium apple / 1 mg Nutrient Analysis  Calories: 2005  Protein: 85 g  Carbohydrate: 245 g  Fat: 78 g  Sodium: 3560 mg Document Released: 05/24/2005 Document Revised: 08/16/2011 Document Reviewed: 08/25/2009 [facility] Patient Information 2013 [place], [place].

## 2012-03-27 ENCOUNTER — Emergency Department (HOSPITAL_COMMUNITY): Payer: Medicaid Other

## 2012-03-27 ENCOUNTER — Emergency Department (HOSPITAL_COMMUNITY)
Admission: EM | Admit: 2012-03-27 | Discharge: 2012-03-27 | Disposition: A | Discharge status: Home / Self Care | Payer: Medicaid Other | Attending: Emergency Medicine | Admitting: Emergency Medicine

## 2012-03-27 ENCOUNTER — Encounter (HOSPITAL_COMMUNITY): Payer: Self-pay | Admitting: *Deleted

## 2012-03-27 DIAGNOSIS — I201 Angina pectoris with documented spasm: Secondary | ICD-10-CM | POA: Insufficient documentation

## 2012-03-27 DIAGNOSIS — I10 Essential (primary) hypertension: Secondary | ICD-10-CM | POA: Insufficient documentation

## 2012-03-27 DIAGNOSIS — IMO0001 Reserved for inherently not codable concepts without codable children: Secondary | ICD-10-CM | POA: Insufficient documentation

## 2012-03-27 DIAGNOSIS — R079 Chest pain, unspecified: Secondary | ICD-10-CM

## 2012-03-27 DIAGNOSIS — R000 Tachycardia, unspecified: Secondary | ICD-10-CM | POA: Insufficient documentation

## 2012-03-27 DIAGNOSIS — Z79899 Other long term (current) drug therapy: Secondary | ICD-10-CM | POA: Insufficient documentation

## 2012-03-27 DIAGNOSIS — E039 Hypothyroidism, unspecified: Secondary | ICD-10-CM | POA: Insufficient documentation

## 2012-03-27 DIAGNOSIS — I701 Atherosclerosis of renal artery: Secondary | ICD-10-CM | POA: Insufficient documentation

## 2012-03-27 DIAGNOSIS — I7789 Other specified disorders of arteries and arterioles: Secondary | ICD-10-CM | POA: Insufficient documentation

## 2012-03-27 DIAGNOSIS — E785 Hyperlipidemia, unspecified: Secondary | ICD-10-CM | POA: Insufficient documentation

## 2012-03-27 DIAGNOSIS — Z7982 Long term (current) use of aspirin: Secondary | ICD-10-CM | POA: Insufficient documentation

## 2012-03-27 DIAGNOSIS — G43909 Migraine, unspecified, not intractable, without status migrainosus: Secondary | ICD-10-CM | POA: Insufficient documentation

## 2012-03-27 DIAGNOSIS — Z8673 Personal history of transient ischemic attack (TIA), and cerebral infarction without residual deficits: Secondary | ICD-10-CM | POA: Insufficient documentation

## 2012-03-27 LAB — CBC
HCT: 34.4 % — ABNORMAL LOW (ref 36.0–46.0)
MCHC: 36.3 g/dL — ABNORMAL HIGH (ref 30.0–36.0)
MCV: 84.7 fL (ref 78.0–100.0)
Platelets: 279 10*3/uL (ref 150–400)
RDW: 12.6 % (ref 11.5–15.5)
WBC: 9.7 10*3/uL (ref 4.0–10.5)

## 2012-03-27 LAB — COMPREHENSIVE METABOLIC PANEL
ALT: 24 U/L (ref 0–35)
Albumin: 3.7 g/dL (ref 3.5–5.2)
Alkaline Phosphatase: 95 U/L (ref 39–117)
BUN: 20 mg/dL (ref 6–23)
Chloride: 103 mEq/L (ref 96–112)
GFR calc Af Amer: 75 mL/min — ABNORMAL LOW (ref >90)
Glucose, Bld: 91 mg/dL (ref 70–99)
Potassium: 3.7 mEq/L (ref 3.5–5.1)
Sodium: 138 mEq/L (ref 135–145)
Total Bilirubin: 0.3 mg/dL (ref 0.3–1.2)
Total Protein: 6.8 g/dL (ref 6.0–8.3)

## 2012-03-27 MED ORDER — LORAZEPAM 1 MG PO TABS
1.0000 mg | ORAL_TABLET | Freq: Once | ORAL | Status: AC
  Administered 2012-03-27: 1 mg via ORAL
  Filled 2012-03-27: qty 1

## 2012-03-27 MED ORDER — GI COCKTAIL ~~LOC~~
30.0000 mL | Freq: Once | ORAL | Status: AC
  Administered 2012-03-27: 30 mL via ORAL
  Filled 2012-03-27: qty 30

## 2012-03-27 MED ORDER — FAMOTIDINE 20 MG PO TABS
20.0000 mg | ORAL_TABLET | Freq: Once | ORAL | Status: AC
  Administered 2012-03-27: 20 mg via ORAL
  Filled 2012-03-27: qty 1

## 2012-03-27 NOTE — ED Provider Notes (Signed)
History     CSN: [id number]  Arrival date & time 03/27/12  2136   First MD Initiated Contact with Patient 03/27/12 2146      Chief Complaint  Patient presents with  . Chest Pain    (Consider location/radiation/quality/duration/timing/severity/associated sxs/prior treatment) Patient is a 51 y.o. female presenting with chest pain. The history is provided by the patient.  Chest Pain Pertinent negatives for primary symptoms include no fever, no shortness of breath and no abdominal pain.   pt c/o midline, central chest pain for the past 2 days. Constant/continuous. No specific exacerbating or alleviating factors. At rest. No relation to activity or exertion, no positional change. No cough or uri c/o. No fever or chills. No chest wall injury or strain. No pleuritic pain. No leg pain or swelling, no immobility/trauma/surgery, no hx dvt or pe. No hx cad. No family hx premature cad. No drug use.      Past Medical History  Diagnosis Date  . Migraine headache   . Chest pain     a. No angiographic CAD by cath 2009 in [place]. There was catheter-induced RCA vasospasm versus microvascular obstruction. bEugenie Birks myoview 04/2010 - EF 79%, normal perfusion.;  c. ETT-Myoview 7/13: no ischemia, EF 77%, submax exercise  . HTN (hypertension)     a. Likely related to RAS; urinary metanephrines and plasma renin/aldosterone ratio normal. b. H/o HTN urgency 06/2011 after being off BP meds for a number of days  . Renal artery stenosis     a. Due to fibromuscular dysplasia. b. Renal stent 2009. c. Studies:  CTA abdomen (1/13) with evidence for fibromuscular dysplasia, right renal artery stent appears patent. Renal artery dopplers (2/13) with FMD but no evidence for signficant stenosis.   Marland Kitchen HLD (hyperlipidemia)   . Fibromyalgia   . CVA (cerebral vascular accident) 2006  . Coronary vasospasm   . Hypothyroidism   . Fibromuscular dysplasia     a. Renal artery stenosis due to this. b. Carotid dopplers  07/2011 c/w FMD but no significant stenosis  . Tachycardia     10/27/10-Holter -NSR with occ sinus tachy-rare PACs, no sig arrhythmia  . Hx of echocardiogram     a. Echo (1/13): EF 55-60%, grade 1 diastolic dysfunction    Past Surgical History  Procedure Date  . Renal artery stent   . Spine surgery   . Abdominal hysterectomy   . Back surgery     Family History  Problem Relation Age of Onset  . Heart attack    . Stroke    . Hypertension Mother   . Hypertension Father   . Hypertension Brother     Died from a massive stroke in setting of severe HTN at 46 (had had MI at 58)  . Metabolic syndrome Sister     History  Substance Use Topics  . Smoking status: Never Smoker   . Smokeless tobacco: Never Used  . Alcohol Use: No    OB History    Grav Para Term Preterm Abortions TAB SAB Ect Mult Living                  Review of Systems  Constitutional: Negative for fever and chills.  HENT: Negative for neck pain.   Eyes: Negative for redness.  Respiratory: Negative for shortness of breath.   Cardiovascular: Positive for chest pain. Negative for leg swelling.  Gastrointestinal: Negative for abdominal pain.  Genitourinary: Negative for flank pain.  Musculoskeletal: Negative for back pain.  Skin: Negative  for rash.  Neurological: Positive for headaches.       On and off frontal headache, gradual onset, dull, mild-mod, c/w prior headaches, no neck pain/stiffness. No change in vision, no focal numbness/weakness.   Hematological: Does not bruise/bleed easily.  Psychiatric/Behavioral: Negative for confusion.    Allergies  Hydromorphone hcl; Prochlorperazine edisylate; and Sumatriptan  Home Medications   Current Outpatient Rx  Name Route Sig Dispense Refill  . ASPIRIN EC 81 MG PO TBEC Oral Take 81 mg by mouth daily.    . ATORVASTATIN CALCIUM 40 MG PO TABS Oral Take 1 tablet (40 mg total) by mouth daily. 30 tablet 6  . CYCLOBENZAPRINE HCL 10 MG PO TABS Oral Take 10 mg by mouth  3 (three) times daily as needed. For spasms    . DULOXETINE HCL 30 MG PO CPEP Oral Take 30 mg by mouth daily.     . ERGOCALCIFEROL 50000 UNITS PO CAPS Oral Take 50,000 Units by mouth once a week. On monday    . LABETALOL HCL 200 MG PO TABS  TAKE 4 TABS AT 7 AM; 4 TABS AT 7 PM 240 tablet 11  . LEVOTHYROXINE SODIUM 125 MCG PO TABS Oral Take 1.5 tablets (187.5 mcg total) by mouth daily before breakfast. 45 tablet 1  . LUBIPROSTONE 24 MCG PO CAPS Oral Take 24 mcg by mouth daily as needed. For irritable bowel syndrome w/ constipation    . NIFEDIPINE ER 60 MG PO TB24 Oral Take 2 tablets (120 mg total) by mouth daily. 60 tablet 11  . NITROGLYCERIN 0.4 MG SL SUBL Sublingual Place 0.4 mg under the tongue every 5 (five) minutes as needed. For chest pain    . POTASSIUM CHLORIDE CRYS ER 20 MEQ PO TBCR Oral Take 1 tablet (20 mEq total) by mouth daily. 30 tablet 6  . SPIRONOLACTONE 25 MG PO TABS Oral Take 1 tablet (25 mg total) by mouth daily. 30 tablet 6  . ZOLPIDEM TARTRATE 5 MG PO TABS Oral Take 5-10 mg by mouth at bedtime as needed. For sleep      BP 125/76  Pulse 106  Temp 98 F (36.7 C) (Oral)  Resp 16  SpO2 100%  Physical Exam  Nursing note and vitals reviewed. Constitutional: She is oriented to person, place, and time. She appears well-developed and well-nourished. No distress.  HENT:  Head: Atraumatic.  Nose: Nose normal.  Mouth/Throat: Oropharynx is clear and moist.       No sinus or temporal tenderness.  Eyes: Conjunctivae normal and EOM are normal. Pupils are equal, round, and reactive to light. No scleral icterus.  Neck: Neck supple. No tracheal deviation present. No thyromegaly present.       No stiffness or rigidity.   Cardiovascular: Normal rate, regular rhythm, normal heart sounds and intact distal pulses.  Exam reveals no gallop and no friction rub.   No murmur heard. Pulmonary/Chest: Effort normal and breath sounds normal. No respiratory distress.  Abdominal: Soft. Normal  appearance and bowel sounds are normal. She exhibits no distension. There is no tenderness.  Genitourinary:       No cva tenderness.  Musculoskeletal: Normal range of motion. She exhibits no edema and no tenderness.  Neurological: She is alert and oriented to person, place, and time. No cranial nerve deficit.       Motor intact bilaterally. Steady gait.   Skin: Skin is warm and dry. No rash noted. She is not diaphoretic.  Psychiatric:       Anxious.  ED Course  Procedures (including critical care time)  Results for orders placed during the hospital encounter of 03/27/12  COMPREHENSIVE METABOLIC PANEL      Component Value Range   Sodium 138  135 - 145 mEq/L   Potassium 3.7  3.5 - 5.1 mEq/L   Chloride 103  96 - 112 mEq/L   CO2 23  19 - 32 mEq/L   Glucose, Bld 91  70 - 99 mg/dL   BUN 20  6 - 23 mg/dL   Creatinine, Ser 1.61  0.50 - 1.10 mg/dL   Calcium 8.8  8.4 - 09.6 mg/dL   Total Protein 6.8  6.0 - 8.3 g/dL   Albumin 3.7  3.5 - 5.2 g/dL   AST 21  0 - 37 U/L   ALT 24  0 - 35 U/L   Alkaline Phosphatase 95  39 - 117 U/L   Total Bilirubin 0.3  0.3 - 1.2 mg/dL   GFR calc non Af Amer 65 (*) >90 mL/min   GFR calc Af Amer 75 (*) >90 mL/min  CBC      Component Value Range   WBC 9.7  4.0 - 10.5 K/uL   RBC 4.06  3.87 - 5.11 MIL/uL   Hemoglobin 12.5  12.0 - 15.0 g/dL   HCT 04.5 (*) 40.9 - 81.1 %   MCV 84.7  78.0 - 100.0 fL   MCH 30.8  26.0 - 34.0 pg   MCHC 36.3 (*) 30.0 - 36.0 g/dL   RDW 91.4  78.2 - 95.6 %   Platelets 279  150 - 400 K/uL  TROPONIN I      Component Value Range   Troponin I <0.30  <0.30 ng/mL   Dg Chest 2 View  03/27/2012  *RADIOLOGY REPORT*  Clinical Data: Chest pain, shortness of breath  CHEST - 2 VIEW  Comparison: 12/14/2011  Findings: Lungs are clear. No pleural effusion or pneumothorax. The cardiomediastinal contours are within normal limits. The visualized bones and soft tissues are without significant appreciable abnormality.  IMPRESSION: No radiographic  evidence of acute cardiopulmonary process.   Original Report Authenticated By: Waneta Martins, M.D.       MDM  Labs. Ecg. Cxr.  Pt appears anxious, hx same. Ativan po. Will also try pepcid and gi cocktail for symptom relief.  Reviewed nursing notes and prior charts for additional history.     Date: 03/27/2012  Rate: 103  Rhythm: sinus tachycardia  QRS Axis: normal  Intervals: normal  ST/T Wave abnormalities: normal  Conduction Disutrbances:none  Narrative Interpretation:   Old EKG Reviewed: unchanged    Reviewed cardiology notes, pt w episodes recurrent cp, note prior cath 2009 w no signif cad, stress test, and feeling likely w non cardiac cp.   After constant symptoms x 2 days, troponin neg.   cxr neg acute.   Recheck pt calm. Nad, no increased wob.    Suzi Roots, MD 03/27/12 [phone]

## 2012-03-27 NOTE — ED Notes (Signed)
Pt reports not feeling well on Sunday with progression of weakness, neck pain and heaviness in her chest today. Pt reports being short of breath and breaking out into sweats. Pt's skin is warm and dry upon assessment, lungs are clear, equal and unlabored.

## 2012-03-27 NOTE — ED Notes (Signed)
Per EMS: pt coming from home with c/o chest pain. Pt reports generalized chest pressure throughout the day. 9/10 pain. Pt reports weakness in both arms, nausea. Pt has hx of CVA with loss in left ear as only deficit. A&Ox4. 18g left AC. 2 nitro, 162 asa en route with slight relief. Pt reports similar symptoms from last MI in 2009. Pt is slightly diaphoretic, and warm to the touch. Lungs clear, equal, and unlabored.

## 2012-03-27 NOTE — ED Notes (Signed)
Patient transported to X-ray 

## 2012-03-28 ENCOUNTER — Ambulatory Visit (INDEPENDENT_AMBULATORY_CARE_PROVIDER_SITE_OTHER): Payer: Medicaid Other | Admitting: Family Medicine

## 2012-03-28 ENCOUNTER — Encounter: Payer: Self-pay | Admitting: Family Medicine

## 2012-03-28 VITALS — BP 142/87 | HR 101 | Temp 98.4°F | Ht 64.0 in | Wt 136.0 lb

## 2012-03-28 DIAGNOSIS — I10 Essential (primary) hypertension: Secondary | ICD-10-CM

## 2012-03-28 DIAGNOSIS — I639 Cerebral infarction, unspecified: Secondary | ICD-10-CM

## 2012-03-28 DIAGNOSIS — Z124 Encounter for screening for malignant neoplasm of cervix: Secondary | ICD-10-CM

## 2012-03-28 DIAGNOSIS — Z0000 Encounter for general adult medical examination without abnormal findings: Secondary | ICD-10-CM

## 2012-03-28 DIAGNOSIS — Z23 Encounter for immunization: Secondary | ICD-10-CM

## 2012-03-28 DIAGNOSIS — I6350 Cerebral infarction due to unspecified occlusion or stenosis of unspecified cerebral artery: Secondary | ICD-10-CM

## 2012-03-28 DIAGNOSIS — I701 Atherosclerosis of renal artery: Secondary | ICD-10-CM

## 2012-03-28 DIAGNOSIS — IMO0001 Reserved for inherently not codable concepts without codable children: Secondary | ICD-10-CM

## 2012-03-28 MED ORDER — CYCLOBENZAPRINE HCL 10 MG PO TABS: 10.0000 mg | ORAL_TABLET | Freq: Every evening | ORAL | Status: DC | PRN

## 2012-03-28 NOTE — Patient Instructions (Addendum)
[name] Rowe,  Thank you for coming in to see me today. It was good to meet you.   Regarding fibromyalgia,  I refilled flexeril today. Would like to review previous medical records.    Regarding health maintenance Tdap, flu today. No pap due to inability to visualize cervix. Mammogram, call to schedule mammogram.   Schedule f/u with me in 4 weeks.   Dr. Armen Pickup

## 2012-03-31 ENCOUNTER — Encounter: Payer: Self-pay | Admitting: Family Medicine

## 2012-03-31 DIAGNOSIS — Z0000 Encounter for general adult medical examination without abnormal findings: Secondary | ICD-10-CM | POA: Insufficient documentation

## 2012-03-31 NOTE — Progress Notes (Signed)
Subjective:     Patient ID: [name] Rowe, female   DOB: [birth year], 51 y.o.   MRN: [id number]  HPI 51 yo F presents to establish primary care. She has a complicated medical history. The exam was focused on reviewing history and determining what health maintenance test are due.   Health maintenance:  Last pap 2009.  Mammogram 2013.  Colonoscopy never.   Review of Systems Patient Information Form: Screening and ROS  AUDIT-C Score: 0 Do you feel safe in relationships? yes PHQ-2:positive  Review of Symptoms  General:  Negative for nexplained weight loss, fever Skin: Negative for new or changing mole, sore that won't heal HEENT: Negative for trouble hearing, trouble seeing, ringing in ears, mouth sores, hoarseness, change in voice, dysphagia. CV:  Positive for chest pain, dyspnea, edema, palpitations Resp: Negative for cough, dyspnea, hemoptysis GI: Positive for constipation. Negative for nausea, vomiting, diarrhea, abdominal pain, melena, hematochezia. GU: Negative for dysuria, incontinence, urinary hesitance, hematuria, vaginal or penile discharge, polyuria, sexual difficulty, lumps in testicle or breasts MSK: Positive for muscle cramps and aches. Negative for joint pain or swelling Neuro: Positive for headaches, weakness, numbness, dizziness. Negative for passing out/fainting Psych: Negative for depression, anxiety, memory problems     Objective:   Physical Exam BP 142/87  Pulse 101  Temp 98.4 F (36.9 C) (Oral)  Ht 5\' 4"  (1.626 m)  Wt 136 lb (61.689 kg)  BMI 23.34 kg/m2 General appearance: alert, cooperative and no distress Neck: no adenopathy, no carotid bruit, no JVD, supple, symmetrical, trachea midline and thyroid not enlarged, symmetric, no tenderness/mass/nodules Back: healed lumbar scar with concave depression noted.  Lungs: clear to auscultation bilaterally Heart: regular rate and rhythm, S1, S2 normal, no murmur, click, rub or gallop Pelvic: external genitalia  normal, positive findings: unable to visualize cervix (? surgically absent), uterus surgically absent and vagina normal without discharge     Assessment and Plan:

## 2012-03-31 NOTE — Assessment & Plan Note (Signed)
A: s/p hysterectomy done in [place] in 2009.  P: Release of medical records form signed. Records requested.

## 2012-04-25 ENCOUNTER — Other Ambulatory Visit: Payer: Medicaid Other

## 2012-04-25 ENCOUNTER — Ambulatory Visit: Payer: Medicaid Other | Admitting: Cardiology

## 2012-05-15 ENCOUNTER — Encounter: Payer: Self-pay | Admitting: Cardiology

## 2012-05-15 ENCOUNTER — Other Ambulatory Visit (INDEPENDENT_AMBULATORY_CARE_PROVIDER_SITE_OTHER): Payer: Medicaid Other

## 2012-05-15 ENCOUNTER — Ambulatory Visit (INDEPENDENT_AMBULATORY_CARE_PROVIDER_SITE_OTHER): Payer: Medicaid Other | Admitting: Cardiology

## 2012-05-15 VITALS — BP 144/110 | HR 116 | Ht 63.0 in | Wt 134.0 lb

## 2012-05-15 DIAGNOSIS — R079 Chest pain, unspecified: Secondary | ICD-10-CM

## 2012-05-15 DIAGNOSIS — I10 Essential (primary) hypertension: Secondary | ICD-10-CM

## 2012-05-15 DIAGNOSIS — R0989 Other specified symptoms and signs involving the circulatory and respiratory systems: Secondary | ICD-10-CM

## 2012-05-15 DIAGNOSIS — I201 Angina pectoris with documented spasm: Secondary | ICD-10-CM

## 2012-05-15 DIAGNOSIS — E785 Hyperlipidemia, unspecified: Secondary | ICD-10-CM

## 2012-05-15 MED ORDER — SPIRONOLACTONE 50 MG PO TABS: 50.0000 mg | ORAL_TABLET | Freq: Every day | ORAL | Status: DC

## 2012-05-15 MED ORDER — CHLORTHALIDONE 25 MG PO TABS: 25.0000 mg | ORAL_TABLET | Freq: Every day | ORAL | Status: DC

## 2012-05-15 NOTE — Progress Notes (Signed)
Patient ID: [name] Rowe, female   DOB: 10-May-1961, 51 y.o.   MRN: [id number] PCP: [facility]  51 yo with history of fibromuscular dysplasia of the renal arteries, HTN, and possible coronary vasospasm versus Syndrome X presents for followup.  Patient was admitted to the hospital in [place] in 2009 with severe chest pain.  At that time, she had a left heart cath showing no angiographic coronary disease but the catheter did induce RCA spasm, leading to concern that her chest pain might be coronary vasospasm.  She also had HTN found to be probably related to fibromuscular dysplasia of the renal arteries.  She had a stent placed in the right renal artery in 2009.    She was admitted twice in 1/13, the first time with a hypertensive urgency (she had been off her BP meds for a number of days).  She was re-admitted several days later with orthostatic symptoms and probable rebound tachycardia (she had not picked up her beta blocker prescription after discharge).  She was restarted on beta blocker and HCTZ was stopped.  Carotids in 2/13 showed no significant stenosis and renal artery dopplers in 2/13 showed no significant stenosis.    She was admitted again in 7/13 with atypical chest pain.  She had an ETT-myoview.  This was a submaximal study but showed no ischemia or infarction, EF 77%.  She was in the ER with chest pain in 10/13 but was sent home after cardiac enzymes and ECG were unremarkable.   She has not had much chest pain recently.  She will get occasional atypical chest pain that lasts only for a few seconds.  BP continues to run high, 140s/90s-160s/110s at home.  BP control worsens the more anxious she feels.     Labs (10/11): LDL 136, HDL 44, TGs 159, creatinine 1.06, K 3.8 Labs (11/11): LDL 101, HDL 49, LFTs normal, K 4.8, creatinine 0.83 Labs (1/13): K 4.2, creatinine 1.16, urine metanephrines normal, PRA/aldosterone ratio normal, LDL 156, HDL 56 Labs (7/13): K 3.8, creatinine 0.96,  TSH normal, LDL 98, HDL 60 Labs (9/13): LDL 166, TSH normal, K 3.7, creatinine 1.0  Allergies (verified):  1)  ! Compazine 2)  ! Imitrex  Past Medical History: 1. Migraine headaches 2. Chest pain: No angiographic coronary disease on cath in 2009 in [place].  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  Suspect coronary vasospasm versus microvascular obstruction.  Lexiscan myoview (11/11): EF 79%, normal perfusion.  ETT-myoview (9/13) with EF 77%, no ischemic or infarction but submaximal study.  3. HTN: Renal artery stenosis has played a role. Urinary metanephrines and plasma renin/aldosterone ratio normal.  4. 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  She had a renal artery stent in 2009.  Renal artery dopplers (12/11) suggestive of fibromuscular dysplasia but no significant stenosis. CTA abdomen (1/13) with evidence for fibromuscular dysplasia, right renal artery stent appears patent.  Renal artery dopplers (2/13) with FMD but no evidence for signficant stenosis.  5. Hyperlipidemia 6. Fibromyalgia 7. Echo (1/13) EF 55-60%, grade I diastolic dysfunction. 8. Hypothyroidism 9. Carotid dopplers (2/13): consistent with FMD but no significant stenosis.  10. Fibromuscular dysplasia.      Family History: Mother with MI at 78, Father with MI at 17, brother with MI at 64 and died with stroke at 43  Social History: Moved to [place] from [place] with her husband.  Denies smoking cigarettes or using illegal drugs Alcohol Use - no Drug Use - no  ROS: All systems reviewed and  negative except as per HPI.    Current Outpatient Prescriptions  Medication Sig Dispense Refill  . aspirin EC 81 MG tablet Take 81 mg by mouth daily.      Marland Kitchen atorvastatin (LIPITOR) 40 MG tablet Take 1 tablet (40 mg total) by mouth daily.  30 tablet  6  . cyclobenzaprine (FLEXERIL) 10 MG tablet Take 1 tablet (10 mg total) by mouth at bedtime as needed for muscle spasms. For spasms  30 tablet  3  .  DULoxetine (CYMBALTA) 30 MG capsule Take 30 mg by mouth daily.       . ergocalciferol (VITAMIN D2) 50000 UNITS capsule Take 50,000 Units by mouth once a week. On monday      . labetalol (NORMODYNE) 200 MG tablet TAKE 4 TABS AT 7 AM; 4 TABS AT 7 PM  240 tablet  11  . levothyroxine (SYNTHROID, LEVOTHROID) 125 MCG tablet Take 1.5 tablets (187.5 mcg total) by mouth daily before breakfast.  45 tablet  1  . lubiprostone (AMITIZA) 24 MCG capsule Take 24 mcg by mouth daily as needed. For irritable bowel syndrome w/ constipation      . NIFEdipine (PROCARDIA-XL/ADALAT CC) 60 MG 24 hr tablet Take 2 tablets (120 mg total) by mouth daily.  60 tablet  11  . nitroGLYCERIN (NITROSTAT) 0.4 MG SL tablet Place 0.4 mg under the tongue every 5 (five) minutes as needed. For chest pain      . potassium chloride SA (K-DUR,KLOR-CON) 20 MEQ tablet Take 1 tablet (20 mEq total) by mouth daily.  30 tablet  6  . spironolactone (ALDACTONE) 25 MG tablet Take 1 tablet (25 mg total) by mouth daily.  30 tablet  6  . zolpidem (AMBIEN) 5 MG tablet Take 5-10 mg by mouth at bedtime as needed. For sleep        BP 144/110  Pulse 116  Ht 5\' 3"  (1.6 m)  Wt 134 lb (60.782 kg)  BMI 23.74 kg/m2  SpO2 98% General:  Well developed, well nourished, in no acute distress. Neck:  Neck supple, no JVD. No masses, thyromegaly or abnormal cervical nodes. Lungs:  Clear bilaterally to auscultation and percussion. Heart:  Non-displaced PMI, chest non-tender; regular rate and rhythm, S1, S2 without murmurs, rubs. +S4. Carotid upstroke normal, no bruit.  Pedals normal pulses. No edema, no varicosities. Abdomen:  Bowel sounds positive; abdomen soft and non-tender without masses, organomegaly, or hernias noted. No hepatosplenomegaly. Extremities:  No clubbing or cyanosis. Neurologic:  Alert and oriented x 3. Psych:  Normal affect.  Assessment/Plan:  Chest pain I suspect that the patient has either coronary microvascular disease (Syndrome X) or  coronary vasospasm. Chest pain with exertion certainly is seen with microvascular disease and she is the right demographic. She had a cath with no angiographic coronary disease in 2009 and Lexiscan myoview in 11/11 showed no evidence for ischemia or infarction. Repeat myoview in 7/13 showed no ischemia or infarction but was submaximal. She has not had significant chest pain in the last few weeks. She is on nifedipine XL, which should help with vasospasm if this is part of the etiology of her chest pain.  HTN  BP is still high. She took all her meds today.  BP runs higher with increased anxiety and she has been under a lot of stress recently.  - Add chlorthalidone 25 mg daily and increase spironolactone to 50 mg daily.   - BMET in 2 wks.  - Followup with Tereso Newcomer in 1 month.  HYPERLIPIDEMIA  LDL was high in 9/13 but she was not taking atorvastatin. She restarted it.  Would get lipids with BMET in 2 wks.   [name] 05/15/2012

## 2012-05-15 NOTE — Patient Instructions (Addendum)
Increase chlorthalidone to 25mg  daily.  Increase Spironolactone to 50mg  daily.  Your physician recommends that you return for a FASTING lipid profile /BMET in 2 weeks.  Your physician recommends that you schedule a follow-up appointment in: 1 month with Tereso Newcomer, PA,c  Your physician recommends that you schedule a follow-up appointment in: 4 months with Dr Shirlee Latch.

## 2012-05-26 ENCOUNTER — Encounter: Payer: Self-pay | Admitting: Family Medicine

## 2012-05-29 ENCOUNTER — Telehealth: Payer: Self-pay | Admitting: *Deleted

## 2012-05-29 ENCOUNTER — Other Ambulatory Visit (INDEPENDENT_AMBULATORY_CARE_PROVIDER_SITE_OTHER): Payer: Medicaid Other

## 2012-05-29 DIAGNOSIS — E876 Hypokalemia: Secondary | ICD-10-CM

## 2012-05-29 DIAGNOSIS — I10 Essential (primary) hypertension: Secondary | ICD-10-CM

## 2012-05-29 LAB — BASIC METABOLIC PANEL
BUN: 21 mg/dL (ref 6–23)
Calcium: 8.9 mg/dL (ref 8.4–10.5)
GFR: 46.78 mL/min — ABNORMAL LOW (ref >60.00)
Glucose, Bld: 99 mg/dL (ref 70–99)

## 2012-05-29 LAB — LIPID PANEL: Cholesterol: 160 mg/dL (ref 0–200)

## 2012-05-29 MED ORDER — POTASSIUM CHLORIDE CRYS ER 20 MEQ PO TBCR: EXTENDED_RELEASE_TABLET | ORAL | Status: DC

## 2012-05-29 NOTE — Telephone Encounter (Signed)
Tanya from the lab calling in critical k+2.7,

## 2012-05-29 NOTE — Telephone Encounter (Signed)
Per DOD Dr [place] pt to take k+ 40 meq bid x 3 days then 40 meq daily, reviewed lab results with pt and instructions on how to take med, pt states she is not consistent with k+, I re explained the importance and asked her to discuss difficulty of size of pill with the pharmacy to discuss different choices. Pt was set for 1 week lab draw. Pt verbalized understanding and agreed to plan.

## 2012-05-29 NOTE — Telephone Encounter (Signed)
New Problem:    Patient called in with additional questions about her potasium levels in relation to her feeling faint.  Please call back.

## 2012-05-29 NOTE — Telephone Encounter (Signed)
PT AWARE  NEEDS TO   GET K LEVEL  UP  THAT MAY BE  THE CULPRIT FOR THE DIZZINESS PER PT HAS TAKEN   K AS DIRECTED WILL CONTINUE TO MONITOR .Zack Seal

## 2012-06-05 ENCOUNTER — Other Ambulatory Visit (INDEPENDENT_AMBULATORY_CARE_PROVIDER_SITE_OTHER): Payer: Medicaid Other

## 2012-06-05 DIAGNOSIS — E876 Hypokalemia: Secondary | ICD-10-CM

## 2012-06-05 LAB — BASIC METABOLIC PANEL
BUN: 17 mg/dL (ref 6–23)
CO2: 32 mEq/L (ref 19–32)
Chloride: 98 mEq/L (ref 96–112)
Glucose, Bld: 101 mg/dL — ABNORMAL HIGH (ref 70–99)
Potassium: 3.7 mEq/L (ref 3.5–5.1)
Sodium: 137 mEq/L (ref 135–145)

## 2012-06-09 ENCOUNTER — Ambulatory Visit (INDEPENDENT_AMBULATORY_CARE_PROVIDER_SITE_OTHER): Payer: Medicaid Other | Admitting: Family Medicine

## 2012-06-09 ENCOUNTER — Encounter: Payer: Self-pay | Admitting: Family Medicine

## 2012-06-09 VITALS — BP 138/94 | HR 104 | Temp 99.6°F | Wt 135.0 lb

## 2012-06-09 DIAGNOSIS — R1310 Dysphagia, unspecified: Secondary | ICD-10-CM | POA: Insufficient documentation

## 2012-06-09 DIAGNOSIS — E049 Nontoxic goiter, unspecified: Secondary | ICD-10-CM

## 2012-06-09 DIAGNOSIS — E040 Nontoxic diffuse goiter: Secondary | ICD-10-CM

## 2012-06-09 LAB — CBC WITH DIFFERENTIAL/PLATELET
Basophils Absolute: 0 10*3/uL (ref 0.0–0.1)
HCT: 42.8 % (ref 36.0–46.0)
Hemoglobin: 15.3 g/dL — ABNORMAL HIGH (ref 12.0–15.0)
Lymphocytes Relative: 24 % (ref 12–46)
Lymphs Abs: 1.5 10*3/uL (ref 0.7–4.0)
Monocytes Absolute: 0.4 10*3/uL (ref 0.1–1.0)
Monocytes Relative: 6 % (ref 3–12)
Neutro Abs: 4.2 10*3/uL (ref 1.7–7.7)
RBC: 4.91 MIL/uL (ref 3.87–5.11)
RDW: 13.2 % (ref 11.5–15.5)
WBC: 6.4 10*3/uL (ref 4.0–10.5)

## 2012-06-09 LAB — T3, FREE: T3, Free: 2.6 pg/mL (ref 2.3–4.2)

## 2012-06-09 NOTE — Assessment & Plan Note (Addendum)
Worsening, Concern for development of obstructive symptoms. 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 This is symmetric, non-tender, no sign of toxicity-slight tachycardia resolves with rest. Will check thyroid function studies and neck CT with contrast to evaluate extent of obstruction given her symptoms. Depending on findings, this may warrant referral to surgery or biopsy if nodule or suspicious findings present. Advised pt to f/u in one week in clinic. She will seek emergency care if obstructive symptoms worsen until that time.

## 2012-06-09 NOTE — Patient Instructions (Addendum)
Nice to meet you. Lets check CT scan and labs. I will call you on Monday with results. Make appointment next week with me or Dr. Armen Pickup for follow up. If you have trouble breathing or symptoms get worse, then call for appointment or go to Emergency Room.  Goiter Goiter is an enlarged thyroid gland. The thyroid gland sits at the base of the front of the neck. The gland produces hormones that regulate mood, body temperature, pulse rate, and digestion. Most goiters are painless and are not a cause for serious concern. Goiters and conditions that cause goiters can be treated if necessary.  CAUSES  Common causes of goiter include:  Graves disease (causes too much hormone to be produced [hyperthyroidism]).  Hashimoto's disease (causes too little hormone to be produced [hypothyroidism]).  Thyroiditis (inflammation of the thyroid sometimes caused by virus or pregnancy).  Nodular goiter (small bumps form; sometimes called toxic nodular goiter).  Pregnancy.  Thyroid cancer (very few goiters with nodules are cancerous).  Certain medications.  Radiation exposure.  Iodine deficiency (more common in developing countries in inland populations). RISK FACTORS Risk factors for goiter include:  A family history of goiter.  Female gender.  Inadequate iodine in the diet.  Age older than 40 years. SYMPTOMS  Many goiters do not cause symptoms. When symptoms do occur, they may include:  Swelling in the lower part of the neck. This swelling can range from a very small bump to a large lump.  A tight feeling in the throat.  A hoarse voice. Less commonly, a goiter may result in:  Coughing.  Wheezing.  Difficulty swallowing.  Difficulty breathing.  Bulging neck veins.  Dizziness. When a goiter is the result of hyperthyroidism, symptoms may include:  Rapid or irregular heart beat.  Sicknessin your stomach (nausea).  Vomiting.  Diarrhea.  Shaking.  Irritable  feeling.  Bulging eyes.  Weight loss.  Heat sensitivity.  Anxiety. When a goiter is the result of hypothyroidism, symptoms may include:  Tiredness.  Dry skin.  Constipation.  Weight gain.  Irregular menstrual cycle.  Depressed mood.  Sensitivity to cold. DIAGNOSIS  Tests used to diagnose goiter include:  A physical exam.  Blood tests, including thyroid hormone levels and antibody testing.  Ultrasonography, computerized X-ray scan (computed tomography, CT) or computerized magnetic scan (magnetic resonance imaging, MRI).  Thyroid scan (imaging along with safe radioactive injection).  Tissue sample taken (biopsy) of nodules. This is sometimes done to confirm that the nodules are not cancerous. TREATMENT  Treatment will depend on the cause of the goiter. Treatment may include:  Monitoring. In some cases, no treatment is necessary, and your doctor will monitor yourcondition at regular check ups.  Medications and supplements. Thyroid medication (thyroid hormone replacement) is available for hyperthroidism and hypothyroidism.  If inflammation is the cause, over-the-counter medication or steroid medication may be recommended.  Goiters caused by iodine deficiency can be treated with iodine supplements or changes in diet.  Radioactive iodine treatment. Radioactive iodine is injected into the blood. It travels to the thyroid gland, kills thyroid cells, and reduces the size of the gland. This is only used when the thyroid gland is overactive. Lifelong thyroid hormone medication is often necessary after this treatment.  Surgery. A procedure to remove all or part of the gland may be recommended in severe cases or when cancer is the cause. Hormones can be taken to replace the hormones normally produced by the thyroid. HOME CARE INSTRUCTIONS   Take medications as directed.  Follow  your caregiver's recommendations for any dietary changes.  Follow up with your caregiver for  further examination and testing, as directed. PREVENTION   If you have a family history of goiter, discuss screening with your doctor.  Make sure you are getting enough iodine in your diet.  Use of iodized table salt can help prevent iodine deficiency. Document Released: 11/11/2009 Document Revised: 08/16/2011 Document Reviewed: 11/11/2009 [facility] Patient Information 2013 [place], [place].

## 2012-06-09 NOTE — Progress Notes (Signed)
  Subjective:    Patient ID: [name] Rowe, female    DOB: 10-Feb-1961, 52 y.o.   MRN: [id number]  HPI  1. Neck swelling. 52 yo female history of renal fibromuscular dysplasia and Graves disease s/p radio-iodine treatment in 2003 presents with bilateral neck swelling and trouble swallowing that started 2 weeks ago. No problems with fluids, but noticed sensation of food sticking in throat. No pain noted. Slowly worsening. Looked in mirror and noted bilateral neck swelling which her friend confirmed. Notice feeling like 'she is drowning' when she lies down at night. Additionally, complains of dry cough, jaw being tired with excess chewing and chronic vision blurriness. Some pain in left ear.   Her weight is stable and slightly increased this year, varies between 129 and 135 usually.  Her synthroid dose recently decreased from 200 to 187 mcg. Hasn't seen an endocrine specialist since moving from [place].  She has some intermittent episodes of arm shaking and anxiety, but no fever, palpitations, chest pain, wheezing, odynophagia, diarrhea, chest pain, dyspnea at rest.  Saw cardiologist recently with labs and reviewed normal CXR from October. Has hx of hypertensive urgency in January. Carotid duplex February wnl.   Review of Systems See HPI otherwise negative.  reports that she has never smoked. She has never used smokeless tobacco.     Objective:   Physical Exam  Vitals reviewed. Constitutional: She is oriented to person, place, and time. She appears well-developed and well-nourished. No distress.  HENT:  Head: Normocephalic and atraumatic.  Right Ear: External ear normal.  Left Ear: External ear normal.  Nose: Nose normal.  Mouth/Throat: Oropharynx is clear and moist. No oropharyngeal exudate.       No visible OP obstruction. No stridor. No pain with palpation of face, temples, TMJ, ears.   Eyes: Conjunctivae normal and EOM are normal. Pupils are equal, round, and reactive to light. Right eye  exhibits no discharge. Left eye exhibits no discharge.  Neck: Normal range of motion. Neck supple. No tracheal deviation present. Thyromegaly present.       Symmetric fullness in bilateral anteriolateral neck. Nontender, no masses palpated.   Cardiovascular: Normal rate, regular rhythm and normal heart sounds.   No murmur heard. Pulmonary/Chest: Effort normal and breath sounds normal. No stridor. No respiratory distress. She has no wheezes. She has no rales. She exhibits no tenderness.  Abdominal: Soft.  Musculoskeletal: She exhibits no edema.  Lymphadenopathy:    She has no cervical adenopathy.  Neurological: She is alert and oriented to person, place, and time. No cranial nerve deficit. Coordination normal.  Skin: No rash noted. She is not diaphoretic.  Psychiatric:       Appears slightly anxious.       Assessment & Plan:

## 2012-06-12 ENCOUNTER — Ambulatory Visit (HOSPITAL_COMMUNITY)
Admission: RE | Admit: 2012-06-12 | Discharge: 2012-06-12 | Disposition: A | Discharge status: Home / Self Care | Payer: Medicaid Other | Source: Ambulatory Visit | Attending: Family Medicine | Admitting: Family Medicine

## 2012-06-12 DIAGNOSIS — R1310 Dysphagia, unspecified: Secondary | ICD-10-CM | POA: Insufficient documentation

## 2012-06-12 DIAGNOSIS — E040 Nontoxic diffuse goiter: Secondary | ICD-10-CM

## 2012-06-12 DIAGNOSIS — R220 Localized swelling, mass and lump, head: Secondary | ICD-10-CM | POA: Insufficient documentation

## 2012-06-12 DIAGNOSIS — E049 Nontoxic goiter, unspecified: Secondary | ICD-10-CM | POA: Insufficient documentation

## 2012-06-12 DIAGNOSIS — R221 Localized swelling, mass and lump, neck: Secondary | ICD-10-CM | POA: Insufficient documentation

## 2012-06-12 MED ORDER — IOHEXOL 300 MG/ML  SOLN
75.0000 mL | Freq: Once | INTRAMUSCULAR | Status: AC | PRN
  Administered 2012-06-12: 75 mL via INTRAVENOUS

## 2012-06-15 ENCOUNTER — Ambulatory Visit (INDEPENDENT_AMBULATORY_CARE_PROVIDER_SITE_OTHER): Payer: Medicaid Other | Admitting: Physician Assistant

## 2012-06-15 ENCOUNTER — Encounter: Payer: Self-pay | Admitting: Physician Assistant

## 2012-06-15 ENCOUNTER — Telehealth: Payer: Self-pay | Admitting: Family Medicine

## 2012-06-15 VITALS — BP 118/76 | HR 103 | Ht 63.0 in | Wt 135.0 lb

## 2012-06-15 DIAGNOSIS — I10 Essential (primary) hypertension: Secondary | ICD-10-CM

## 2012-06-15 NOTE — Progress Notes (Signed)
[address]; [address] [place], [place]  [address] Phone: [phone]; Fax:  [phone]  Date:  06/15/2012   Name:  [name] Rowe   DOB:  03-01-1961   MRN:  [id number]  PCP:  Dessa Phi, MD  Primary Cardiologist:  Dr. [name]  Primary Electrophysiologist:  None    History of Present Illness: [name] Rowe is a 52 y.o. female who returns for follow up on BP.  She has a hx of FMD of the renal arteries, HTN, and possible coronary vasospasm versus Syndrome X.  She was admitted to the hospital in [place] in 2009 with severe chest pain and LHC showed no angiographic coronary disease but the catheter did induce RCA spasm, leading to concern that her chest pain might be coronary vasospasm. She also had HTN found to be probably related to fibromuscular dysplasia of the renal arteries and had a stent placed in the right renal artery in 2009. Here in [place], she had a negative Lexiscan myoview in 11/11.   She was admitted twice in 1/13, the first time with a hypertensive urgency (she had been off her BP meds for a number of days). She was re-admitted several days later with orthostatic symptoms and probable rebound tachycardia (she had not picked up her beta blocker prescription after discharge). She was restarted on beta blocker and HCTZ was stopped. Carotids in 2/13 showed no significant stenosis and renal artery dopplers in 2/13 showed no significant stenosis.   She was admitted again in 7/13 with atypical chest pain. She had an ETT-myoview. This was a submaximal study but showed no ischemia or infarction, EF 77%.    Last seen by Dr. [name] 05/15/12.  Chlorthalidone added for BP.  Spironolactone was increased.  K+ was low on f/u labs and replacement adjusted.  Just saw PCP for ? Goiter and CT of neck is unremarkable.  She is doing ok.  No Labetalol yet today.  BPs have been doing better.  She has a lot of headaches.  Notes a hx of migraines.  Episodes of  chest pain are fairly well controlled.  No dyspnea.  No syncope.  No edema.    Labs (10/11):   LDL 136, HDL 44, TGs 159, creatinine 1.06, K 3.8  Labs (11/11):   LDL 101, HDL 49, LFTs normal, K 4.8, creatinine 0.83  Labs (1/13):     K 4.2, creatinine 1.16, urine metanephrines normal, PRA/aldosterone ratio normal, LDL 156, HDL 56  Labs (7/13):     K 3.8, creatinine 0.96, TSH normal, LDL 98, HDL 60 Labs (9/13):     K 4.3, creatinine 0.7, direct LDL 165.6 (Atorvastatin restarted), TSH 1.33 Labs (12/13):   K 2.7 => 3.7, creatinine 1.3 => 1.1, LDL 99 Labs (1/14):     Hgb 15.3, TSH 2.495  Wt Readings from Last 3 Encounters:  06/15/12 135 lb (61.236 kg)  06/09/12 135 lb (61.236 kg)  05/15/12 134 lb (60.782 kg)     Past Medical History  Diagnosis Date  . Migraine headache   . Chest pain     a. No angiographic CAD by cath 2009 in [place]. There was catheter-induced RCA vasospasm versus microvascular obstruction. bEugenie Birks myoview 04/2010 - EF 79%, normal perfusion.;  c. ETT-Myoview 7/13: no ischemia, EF 77%, submax exercise  . Renal artery stenosis     a. Due to fibromuscular dysplasia. b. Renal stent 2009. c. Studies:  CTA abdomen (1/13) with evidence for fibromuscular dysplasia, right renal artery stent  appears patent. Renal artery dopplers (2/13) with FMD but no evidence for signficant stenosis.   Marland Kitchen CVA (cerebral vascular accident) 2006  . Hypothyroidism   . Fibromuscular dysplasia 2009    a. Renal artery stenosis due to this. b. Carotid dopplers 07/2011 c/w FMD but no significant stenosis  . Tachycardia 2000    10/27/10-Holter -NSR with occ sinus tachy-rare PACs, no sig arrhythmia  . Hx of echocardiogram     a. Echo (1/13): EF 55-60%, grade 1 diastolic dysfunction  . Fibromyalgia 1999  . Coronary vasospasm   . HTN (hypertension) 1988    a. Likely related to RAS; urinary metanephrines and plasma renin/aldosterone ratio normal. b. H/o HTN urgency 06/2011 after being off BP meds for a  number of days  . HLD (hyperlipidemia) 2000  . Myocardial infarction 2009  . Anxiety 2001  . Depression 2001  . Hypertensive crisis 06/29/2011  . Leiomyoma   . Adenomyosis     Current Outpatient Prescriptions  Medication Sig Dispense Refill  . aspirin EC 81 MG tablet Take 81 mg by mouth daily.      Marland Kitchen atorvastatin (LIPITOR) 40 MG tablet Take 1 tablet (40 mg total) by mouth daily.  30 tablet  6  . chlorthalidone (HYGROTON) 25 MG tablet Take 1 tablet (25 mg total) by mouth daily.  30 tablet  3  . cyclobenzaprine (FLEXERIL) 10 MG tablet Take 1 tablet (10 mg total) by mouth at bedtime as needed for muscle spasms. For spasms  30 tablet  3  . DULoxetine (CYMBALTA) 30 MG capsule Take 30 mg by mouth daily.       . ergocalciferol (VITAMIN D2) 50000 UNITS capsule Take 50,000 Units by mouth once a week. On monday      . labetalol (NORMODYNE) 200 MG tablet TAKE 4 TABS AT 7 AM; 4 TABS AT 7 PM  240 tablet  11  . levothyroxine (SYNTHROID, LEVOTHROID) 125 MCG tablet Take 1.5 tablets (187.5 mcg total) by mouth daily before breakfast.  45 tablet  1  . lubiprostone (AMITIZA) 24 MCG capsule Take 24 mcg by mouth daily as needed. For irritable bowel syndrome w/ constipation      . NIFEdipine (PROCARDIA-XL/ADALAT CC) 60 MG 24 hr tablet Take 2 tablets (120 mg total) by mouth daily.  60 tablet  11  . nitroGLYCERIN (NITROSTAT) 0.4 MG SL tablet Place 0.4 mg under the tongue every 5 (five) minutes as needed. For chest pain      . potassium chloride SA (K-DUR,KLOR-CON) 20 MEQ tablet Pt to take 40 meq twice daily for three days then 40 meq daily, k+2.7  75 tablet  6  . spironolactone (ALDACTONE) 50 MG tablet Take 1 tablet (50 mg total) by mouth daily.  30 tablet  3  . zolpidem (AMBIEN) 5 MG tablet Take 5-10 mg by mouth at bedtime as needed. For sleep        Allergies: Allergies  Allergen Reactions  . Hydromorphone Hcl Other (See Comments)    Palpitations  . Prochlorperazine Edisylate Other (See Comments)     Seizure   . Sumatriptan Palpitations    Social history:   The patient  reports that she has never smoked. She has never used smokeless tobacco. She reports that she does not use illicit drugs.    ROS:  Please see the history of present illness.   All other systems reviewed and negative.   PHYSICAL EXAM: VS:  BP 118/76  Pulse 103  Ht 5\' 3"  (1.6 m)  Wt 135 lb (61.236 kg)  BMI 23.91 kg/m2  Well nourished, well developed, in no acute distress HEENT: normal Neck: no JVD Cardiac:  normal S1, S2; RRR; no murmur Lungs:  clear to auscultation bilaterally, no wheezing, rhonchi or rales Abd: soft, nontender, no hepatomegaly, no bruits Ext: no edema Skin: warm and dry Neuro:  CNs 2-12 intact, no focal abnormalities noted  EKG:  NSR, HR 103, normal axis, no acute changes      ASSESSMENT AND PLAN:  1. Hypertension:  BP much better controlled.  Continue current Rx.  She has not taken Labetalol yet today which may explain her higher HR.  Arrange follow up BMET to recheck her K+.    2. Disposition:  She will follow up with Dr. [name] in 3 mos as planned.  Luna Glasgow, PA-C  8:41 AM 06/15/2012

## 2012-06-15 NOTE — Telephone Encounter (Signed)
Called to report negative CT and thyroid study tests. She is still having sensation of dysphagia, so she will f/u with PCP. Might consider observation/reassurance regarding globus type syndrome/anxiety vs ENT evaluation. Will defer further w/u to PCP for now.

## 2012-06-15 NOTE — Patient Instructions (Addendum)
NO CHANGES WERE MADE TODAY  PLEASE HAVE DR. Armen Pickup OFFICE GET A BMET AT YOUR VISIT 06/16/12  PLEASE MAKE AN APPT TO SEE DR. MCLEAN IN April, YOU WILL NEED TO CALL BACK IN ABOUT 1-2 WEEKS SINCE THE SCHEDULE IS NOT OUT YET

## 2012-06-16 ENCOUNTER — Ambulatory Visit (INDEPENDENT_AMBULATORY_CARE_PROVIDER_SITE_OTHER): Payer: Medicaid Other | Admitting: Family Medicine

## 2012-06-16 VITALS — BP 140/100 | HR 121 | Temp 98.9°F | Ht 63.0 in | Wt 134.0 lb

## 2012-06-16 DIAGNOSIS — M549 Dorsalgia, unspecified: Secondary | ICD-10-CM

## 2012-06-16 DIAGNOSIS — E040 Nontoxic diffuse goiter: Secondary | ICD-10-CM

## 2012-06-16 DIAGNOSIS — E876 Hypokalemia: Secondary | ICD-10-CM

## 2012-06-16 DIAGNOSIS — M25559 Pain in unspecified hip: Secondary | ICD-10-CM

## 2012-06-16 DIAGNOSIS — M25552 Pain in left hip: Secondary | ICD-10-CM

## 2012-06-16 DIAGNOSIS — I10 Essential (primary) hypertension: Secondary | ICD-10-CM

## 2012-06-16 DIAGNOSIS — E049 Nontoxic goiter, unspecified: Secondary | ICD-10-CM

## 2012-06-16 MED ORDER — ZOLPIDEM TARTRATE 5 MG PO TABS: 5.0000 mg | ORAL_TABLET | Freq: Every evening | ORAL | Status: DC | PRN

## 2012-06-16 MED ORDER — LUBIPROSTONE 24 MCG PO CAPS: 24.0000 ug | ORAL_CAPSULE | Freq: Every day | ORAL | Status: DC | PRN

## 2012-06-16 MED ORDER — GABAPENTIN 300 MG PO CAPS: 300.0000 mg | ORAL_CAPSULE | Freq: Every day | ORAL | Status: DC

## 2012-06-16 NOTE — Patient Instructions (Addendum)
Tema,  Thank you for coming in to see me today.   1. For trouble swallowing: Continue to chew food completely before swallowing.  Drink plenty of liquids with food  Swallow studied ordered. To be done at [facility].   2. For L hip pain Start gabapentin nightly  Go to [facility] for L hip x-ray I will call with results   3. Low potassium- rechecking today.   4. HTN and tachycardia: may be elevated due to pain. No medication changes. See me in 2 weeks.   Dr. Armen Pickup

## 2012-06-16 NOTE — Assessment & Plan Note (Signed)
A: recurrent L side low back pain with radicular symptoms. Negative straight leg raising. No red flags for malignancy or infection.  P: Gabapentin  Exercise  Tylenol

## 2012-06-16 NOTE — Progress Notes (Signed)
Subjective:     Patient ID: [name] Rowe, female   DOB: 1960/11/08, 52 y.o.   MRN: [id number]  HPI 52 yo F presents for f/u visit to discuss the following:  1. Dysphagia: x 51 weeks. She feels that food gets stuck in her cervical area and she feels a choking sensation at night. She reports that chewing her food more helps. She denies food getting stuck in her the sternal notch. She denies fever, chest pain, shortness of breath, weight loss. She has a history of thyroid ablation and now take synthroid for hypothyroidism.   2. L hip pain: x 2 days. Associated with L sided back pain. Admits to pain radiating from back, butt, hip and groin. Has lateral hip numbness. Denies fecal and urinary incontinence. She has had back surgery x 2. She has similar pain in the past before her back surgery.   3. HTN: BP elevated today. She is taking her medications. BP normal yesterday. She denies chest pain, shortness of breath.   Review of Systems As per HPI     Objective:   Physical Exam BP 147/102  Pulse 121  Temp 98.9 F (37.2 C) (Oral)  Ht 5\' 3"  (1.6 m)  Wt 134 lb (60.782 kg)  BMI 23.74 kg/m2 BP Readings from Last 3 Encounters:  06/16/12 140/100  06/15/12 118/76  06/09/12 138/94   General appearance: alert, cooperative and no distress Throat: lips, mucosa, and tongue normal; teeth and gums normal Neck: no adenopathy, no carotid bruit, no JVD, supple, symmetrical, trachea midline and thyroid not enlarged, symmetric, no tenderness/mass/nodules Back: negative straight leg raising  Extremities: L hip full ROM. pain with internal and external rotation. 5/5 strenght.      Assessment and Plan:

## 2012-06-16 NOTE — Assessment & Plan Note (Signed)
A: normal CT of neck. Patient has had thyroid ablation. Symptoms suggestive of oropharyngeal dysphagia  P: Swallow study ordered.

## 2012-06-16 NOTE — Assessment & Plan Note (Signed)
Recent hypokalemia, replaced  with oral K+.  Check BMP today. Give additional K+ for potassium < 3.5

## 2012-06-16 NOTE — Assessment & Plan Note (Signed)
A: elevated BP today. Normal BP yesterday. ? Pain  P: Continue current medication regimen Treat back in hip pain see problem.

## 2012-06-16 NOTE — Assessment & Plan Note (Signed)
A: recurrent L hip no new injury. Suspect referred back from back. P: L hip x-ray to rule out DJD and avascular necrosis Gabapentin nightly Tylenol up to 3000 mg daily while pain is significant

## 2012-06-19 ENCOUNTER — Ambulatory Visit (HOSPITAL_COMMUNITY)
Admission: RE | Admit: 2012-06-19 | Discharge: 2012-06-19 | Disposition: A | Discharge status: Home / Self Care | Payer: Medicaid Other | Source: Ambulatory Visit | Attending: Family Medicine | Admitting: Family Medicine

## 2012-06-19 DIAGNOSIS — R059 Cough, unspecified: Secondary | ICD-10-CM | POA: Insufficient documentation

## 2012-06-19 DIAGNOSIS — E049 Nontoxic goiter, unspecified: Secondary | ICD-10-CM

## 2012-06-19 DIAGNOSIS — K449 Diaphragmatic hernia without obstruction or gangrene: Secondary | ICD-10-CM | POA: Insufficient documentation

## 2012-06-19 DIAGNOSIS — R1310 Dysphagia, unspecified: Secondary | ICD-10-CM | POA: Insufficient documentation

## 2012-06-19 DIAGNOSIS — R6889 Other general symptoms and signs: Secondary | ICD-10-CM | POA: Insufficient documentation

## 2012-06-19 DIAGNOSIS — M25552 Pain in left hip: Secondary | ICD-10-CM

## 2012-06-19 DIAGNOSIS — R05 Cough: Secondary | ICD-10-CM | POA: Insufficient documentation

## 2012-06-19 DIAGNOSIS — M25559 Pain in unspecified hip: Secondary | ICD-10-CM | POA: Insufficient documentation

## 2012-06-19 DIAGNOSIS — E040 Nontoxic diffuse goiter: Secondary | ICD-10-CM

## 2012-06-20 ENCOUNTER — Telehealth: Payer: Self-pay | Admitting: *Deleted

## 2012-06-20 NOTE — Telephone Encounter (Signed)
Called pt and informed her of her results pt voiced understanding.[name] Rowe [place]

## 2012-06-20 NOTE — Telephone Encounter (Signed)
Message copied by Deno Etienne on Tue Jun 20, 2012 11:52 AM ------      Message from: Durwin Reges      Created: Mon Jun 12, 2012  5:29 PM      Regarding: normal thyroid       Please notify her CT scan and thyroid function all looked normal, there is nothing concerning causing her sensation. I am unsure of the cause, but she may need to see an ENT to evaluate swallowing. Please f/u with her PCP for this referral if still needed.

## 2012-06-23 ENCOUNTER — Encounter (HOSPITAL_COMMUNITY): Payer: Self-pay | Admitting: Emergency Medicine

## 2012-06-23 ENCOUNTER — Emergency Department (HOSPITAL_COMMUNITY)
Admission: EM | Admit: 2012-06-23 | Discharge: 2012-06-24 | Disposition: A | Discharge status: Home / Self Care | Payer: Medicaid Other | Attending: Emergency Medicine | Admitting: Emergency Medicine

## 2012-06-23 ENCOUNTER — Emergency Department (HOSPITAL_COMMUNITY): Payer: Medicaid Other

## 2012-06-23 DIAGNOSIS — M545 Low back pain, unspecified: Secondary | ICD-10-CM | POA: Insufficient documentation

## 2012-06-23 DIAGNOSIS — F329 Major depressive disorder, single episode, unspecified: Secondary | ICD-10-CM | POA: Insufficient documentation

## 2012-06-23 DIAGNOSIS — I10 Essential (primary) hypertension: Secondary | ICD-10-CM | POA: Insufficient documentation

## 2012-06-23 DIAGNOSIS — M549 Dorsalgia, unspecified: Secondary | ICD-10-CM

## 2012-06-23 DIAGNOSIS — G43909 Migraine, unspecified, not intractable, without status migrainosus: Secondary | ICD-10-CM | POA: Insufficient documentation

## 2012-06-23 DIAGNOSIS — Z7982 Long term (current) use of aspirin: Secondary | ICD-10-CM | POA: Insufficient documentation

## 2012-06-23 DIAGNOSIS — E785 Hyperlipidemia, unspecified: Secondary | ICD-10-CM | POA: Insufficient documentation

## 2012-06-23 DIAGNOSIS — E039 Hypothyroidism, unspecified: Secondary | ICD-10-CM | POA: Insufficient documentation

## 2012-06-23 DIAGNOSIS — I252 Old myocardial infarction: Secondary | ICD-10-CM | POA: Insufficient documentation

## 2012-06-23 DIAGNOSIS — R110 Nausea: Secondary | ICD-10-CM | POA: Insufficient documentation

## 2012-06-23 DIAGNOSIS — Z79899 Other long term (current) drug therapy: Secondary | ICD-10-CM | POA: Insufficient documentation

## 2012-06-23 DIAGNOSIS — N8000 Endometriosis of the uterus, unspecified: Secondary | ICD-10-CM | POA: Insufficient documentation

## 2012-06-23 DIAGNOSIS — R109 Unspecified abdominal pain: Secondary | ICD-10-CM | POA: Insufficient documentation

## 2012-06-23 DIAGNOSIS — F3289 Other specified depressive episodes: Secondary | ICD-10-CM | POA: Insufficient documentation

## 2012-06-23 DIAGNOSIS — F411 Generalized anxiety disorder: Secondary | ICD-10-CM | POA: Insufficient documentation

## 2012-06-23 DIAGNOSIS — Z8673 Personal history of transient ischemic attack (TIA), and cerebral infarction without residual deficits: Secondary | ICD-10-CM | POA: Insufficient documentation

## 2012-06-23 DIAGNOSIS — I7789 Other specified disorders of arteries and arterioles: Secondary | ICD-10-CM | POA: Insufficient documentation

## 2012-06-23 DIAGNOSIS — I701 Atherosclerosis of renal artery: Secondary | ICD-10-CM | POA: Insufficient documentation

## 2012-06-23 LAB — CBC WITH DIFFERENTIAL/PLATELET
Eosinophils Absolute: 0.3 10*3/uL (ref 0.0–0.7)
HCT: 42.7 % (ref 36.0–46.0)
Hemoglobin: 15.8 g/dL — ABNORMAL HIGH (ref 12.0–15.0)
Lymphs Abs: 1.9 10*3/uL (ref 0.7–4.0)
MCH: 30.9 pg (ref 26.0–34.0)
Monocytes Relative: 7 % (ref 3–12)
Neutro Abs: 4 10*3/uL (ref 1.7–7.7)
Neutrophils Relative %: 60 % (ref 43–77)
RBC: 5.12 MIL/uL — ABNORMAL HIGH (ref 3.87–5.11)

## 2012-06-23 LAB — COMPREHENSIVE METABOLIC PANEL
Alkaline Phosphatase: 99 U/L (ref 39–117)
BUN: 21 mg/dL (ref 6–23)
Chloride: 95 mEq/L — ABNORMAL LOW (ref 96–112)
GFR calc Af Amer: 49 mL/min — ABNORMAL LOW (ref >90)
Glucose, Bld: 91 mg/dL (ref 70–99)
Potassium: 3.8 mEq/L (ref 3.5–5.1)
Total Bilirubin: 0.5 mg/dL (ref 0.3–1.2)

## 2012-06-23 LAB — URINE MICROSCOPIC-ADD ON

## 2012-06-23 LAB — URINALYSIS, ROUTINE W REFLEX MICROSCOPIC
Ketones, ur: NEGATIVE mg/dL
Nitrite: NEGATIVE
Protein, ur: NEGATIVE mg/dL

## 2012-06-23 MED ORDER — SODIUM CHLORIDE 0.9 % IV BOLUS (SEPSIS)
1000.0000 mL | Freq: Once | INTRAVENOUS | Status: AC
  Administered 2012-06-23: 1000 mL via INTRAVENOUS

## 2012-06-23 MED ORDER — IOHEXOL 350 MG/ML SOLN
100.0000 mL | Freq: Once | INTRAVENOUS | Status: AC | PRN
  Administered 2012-06-23: 100 mL via INTRAVENOUS

## 2012-06-23 MED ORDER — MORPHINE SULFATE 4 MG/ML IJ SOLN
4.0000 mg | Freq: Once | INTRAMUSCULAR | Status: AC
  Administered 2012-06-23: 4 mg via INTRAVENOUS
  Filled 2012-06-23: qty 1

## 2012-06-23 NOTE — ED Notes (Signed)
Patient transported back to B-16 from CT.

## 2012-06-23 NOTE — ED Notes (Signed)
Pt c/o mid back pain around to left flank into left leg and groin area; pt sts pain worse with movement; pt sts hx of kidney issues with renal stents

## 2012-06-23 NOTE — ED Notes (Signed)
Patient transported to CT 

## 2012-06-23 NOTE — ED Notes (Signed)
Pt seated in chair with friend at bedside.  No  Facial grimacing or distress noted at this time

## 2012-06-23 NOTE — ED Provider Notes (Signed)
History     CSN: [id number]  Arrival date & time 06/23/12  1805   First MD Initiated Contact with Patient 06/23/12 2236      Chief Complaint  Patient presents with  . Flank Pain  . Back Pain    (Consider location/radiation/quality/duration/timing/severity/associated sxs/prior treatment) HPI Comments: 52 y/o F h/o fibromuscular dysplasia, renal artery stenosis s/p stent placement (right side) p/w left back/flank/LLQ pain. Onset 2 weeks ago. Progressively worsening. Nausea. No vomiting. Worse with palpation. Pain is constant.  Patient is a 52 y.o. female presenting with abdominal pain. The history is provided by the patient.  Abdominal Pain The primary symptoms of the illness include abdominal pain and nausea. The primary symptoms of the illness do not include fever, fatigue, shortness of breath, vomiting, diarrhea, hematemesis, dysuria, vaginal discharge or vaginal bleeding. Episode onset: 2 weeks ago. The onset of the illness was gradual. The problem has been gradually worsening.  Onset: 2 weeks. The pain came on gradually. The abdominal pain has been gradually worsening since its onset. The abdominal pain is located in the left flank and LLQ. The abdominal pain radiates to the groin. Pain scale: severe. The abdominal pain is relieved by nothing. Exacerbated by: palpation.  Nausea began more than 1 week ago.  Associated with: no clear associations. The patient states that she believes she is currently not pregnant (h/o hysterectomy). The patient has not had a change in bowel habit. Additional symptoms associated with the illness include back pain (left lower back). Symptoms associated with the illness do not include hematuria. Associated medical issues comments: h/o fibromuscular dysplasia and renal artery stenosis s/p stent placement on right.    Past Medical History  Diagnosis Date  . Migraine headache   . Chest pain     a. No angiographic CAD by cath 2009 in [place]. There was  catheter-induced RCA vasospasm versus microvascular obstruction. bEugenie Birks myoview 04/2010 - EF 79%, normal perfusion.;  c. ETT-Myoview 7/13: no ischemia, EF 77%, submax exercise  . Renal artery stenosis     a. Due to fibromuscular dysplasia. b. Renal stent 2009. c. Studies:  CTA abdomen (1/13) with evidence for fibromuscular dysplasia, right renal artery stent appears patent. Renal artery dopplers (2/13) with FMD but no evidence for signficant stenosis.   Marland Kitchen CVA (cerebral vascular accident) 2006  . Hypothyroidism   . Fibromuscular dysplasia 2009    a. Renal artery stenosis due to this. b. Carotid dopplers 07/2011 c/w FMD but no significant stenosis  . Tachycardia 2000    10/27/10-Holter -NSR with occ sinus tachy-rare PACs, no sig arrhythmia  . Hx of echocardiogram     a. Echo (1/13): EF 55-60%, grade 1 diastolic dysfunction  . Fibromyalgia 1999  . Coronary vasospasm   . HTN (hypertension) 1988    a. Likely related to RAS; urinary metanephrines and plasma renin/aldosterone ratio normal. b. H/o HTN urgency 06/2011 after being off BP meds for a number of days  . HLD (hyperlipidemia) 2000  . Myocardial infarction 2009  . Anxiety 2001  . Depression 2001  . Hypertensive crisis 06/29/2011  . Leiomyoma   . Adenomyosis     Past Surgical History  Procedure Date  . Renal artery stent   . Spine surgery   . Back surgery 2010 and 2011    Lumbar back surgery in [place]   . Robotic assisted lap vaginal hysterectomy 05/13/2008    [facility]. da Vinci hysterectomy  for myomatous uterus     Family History  Problem  Relation Age of Onset  . Heart attack    . Stroke    . Hypertension Mother   . Asthma Mother   . Hyperlipidemia Mother   . Stroke Mother   . Hypertension Father   . Cancer Father     Prostate   . Heart attack Father   . Hyperlipidemia Father   . Stroke Father   . Hypertension Brother     Died from a massive stroke in setting of severe HTN at 15 (had had MI at 61)  . Heart attack  Brother   . Early death Brother 57    stroke   . Hyperlipidemia Brother   . Metabolic syndrome Sister   . Depression Sister   . Hypertension Sister   . Diabetes Maternal Aunt     History  Substance Use Topics  . Smoking status: Never Smoker   . Smokeless tobacco: Never Used  . Alcohol Use: Not on file    OB History    Grav Para Term Preterm Abortions TAB SAB Ect Mult Living                  Review of Systems  Constitutional: Negative for fever, activity change, appetite change and fatigue.  HENT: Negative for congestion, rhinorrhea, neck pain and neck stiffness.   Respiratory: Negative for cough, shortness of breath and wheezing.   Cardiovascular: Negative for chest pain, palpitations and leg swelling.  Gastrointestinal: Positive for nausea and abdominal pain. Negative for vomiting, diarrhea and hematemesis.  Genitourinary: Positive for flank pain (left). Negative for dysuria, hematuria, vaginal bleeding, vaginal discharge and difficulty urinating.  Musculoskeletal: Positive for back pain (left lower back).  Skin: Negative for color change and rash.  Neurological: Negative for dizziness and headaches.  All other systems reviewed and are negative.    Allergies  Hydromorphone hcl; Prochlorperazine edisylate; and Sumatriptan  Home Medications   Current Outpatient Rx  Name  Route  Sig  Dispense  Refill  . ASPIRIN EC 81 MG PO TBEC   Oral   Take 81 mg by mouth daily.         . ATORVASTATIN CALCIUM 40 MG PO TABS   Oral   Take 1 tablet (40 mg total) by mouth daily.   30 tablet   6   . CHLORTHALIDONE 25 MG PO TABS   Oral   Take 25 mg by mouth daily.         . CYCLOBENZAPRINE HCL 10 MG PO TABS   Oral   Take 10 mg by mouth 3 (three) times daily as needed. For muscle spasms         . DULOXETINE HCL 30 MG PO CPEP   Oral   Take 30 mg by mouth daily.          . ERGOCALCIFEROL 50000 UNITS PO CAPS   Oral   Take 50,000 Units by mouth once a week. On monday          . GABAPENTIN 300 MG PO CAPS   Oral   Take 1 capsule (300 mg total) by mouth at bedtime.   30 capsule   1   . LABETALOL HCL 200 MG PO TABS   Oral   Take 800 mg by mouth 2 (two) times daily.         Marland Kitchen LEVOTHYROXINE SODIUM 125 MCG PO TABS   Oral   Take 1.5 tablets (187.5 mcg total) by mouth daily before breakfast.   45 tablet  1   . LUBIPROSTONE 24 MCG PO CAPS   Oral   Take 1 capsule (24 mcg total) by mouth daily as needed. For irritable bowel syndrome w/ constipation   90 capsule   3   . NIFEDIPINE ER 60 MG PO TB24   Oral   Take 2 tablets (120 mg total) by mouth daily.   60 tablet   11   . NITROGLYCERIN 0.4 MG SL SUBL   Sublingual   Place 0.4 mg under the tongue every 5 (five) minutes as needed. For chest pain         . POTASSIUM CHLORIDE CRYS ER 20 MEQ PO TBCR   Oral   Take 20 mEq by mouth daily.         Marland Kitchen SPIRONOLACTONE 50 MG PO TABS   Oral   Take 1 tablet (50 mg total) by mouth daily.   30 tablet   3   . ZOLPIDEM TARTRATE 5 MG PO TABS   Oral   Take 1-2 tablets (5-10 mg total) by mouth at bedtime as needed. For sleep   30 tablet   2   . HYDROCODONE-ACETAMINOPHEN 5-325 MG PO TABS   Oral   Take 1 tablet by mouth every 4 (four) hours as needed for pain.   10 tablet   0     BP 140/83  Pulse 109  Temp 98.2 F (36.8 C) (Oral)  Resp 18  SpO2 100%  Physical Exam  Constitutional: She is oriented to person, place, and time. She appears well-developed and well-nourished. No distress.  HENT:  Head: Normocephalic and atraumatic.  Right Ear: External ear normal.  Left Ear: External ear normal.  Eyes: Conjunctivae normal are normal. Right eye exhibits no discharge. Left eye exhibits no discharge.  Neck: No tracheal deviation present.  Cardiovascular: Normal rate, regular rhythm, normal heart sounds and intact distal pulses.   Pulmonary/Chest: Effort normal and breath sounds normal. No stridor. No respiratory distress. She has no wheezes. She  has no rales. She exhibits no tenderness.  Abdominal: Soft. She exhibits no distension, no pulsatile midline mass and no mass. There is tenderness (left flank, LLQ, suprapubic). There is no rigidity, no rebound, no guarding, no CVA tenderness, no tenderness at McBurney's point and negative Murphy's sign.  Musculoskeletal: She exhibits no edema and no tenderness.  Neurological: She is alert and oriented to person, place, and time.  Skin: Skin is warm and dry.    ED Course  Procedures (including critical care time)  Labs Reviewed  CBC WITH DIFFERENTIAL - Abnormal; Notable for the following:    RBC 5.12 (*)     Hemoglobin 15.8 (*)     MCHC 37.0 (*)     All other components within normal limits  COMPREHENSIVE METABOLIC PANEL - Abnormal; Notable for the following:    Chloride 95 (*)     Creatinine, Ser 1.41 (*)     GFR calc non Af Amer 43 (*)     GFR calc Af Amer 49 (*)     All other components within normal limits  URINALYSIS, ROUTINE W REFLEX MICROSCOPIC - Abnormal; Notable for the following:    APPearance CLOUDY (*)     Hgb urine dipstick SMALL (*)     Leukocytes, UA SMALL (*)     All other components within normal limits  URINE MICROSCOPIC-ADD ON - Abnormal; Notable for the following:    Squamous Epithelial / LPF FEW (*)     Bacteria, UA MANY (*)  All other components within normal limits  URINE CULTURE  URINE CULTURE   Ct Cta Abd/pel W/cm &/or W/o Cm  06/24/2012  *RADIOLOGY REPORT*  Clinical Data: Back pain and left flank pain down to the left groin area.  CT ANGIOGRAPHY ABDOMEN AND PELVIS WITH CONTRAST AND WITHOUT CONTRAST  Comparison: 06/30/2011  Findings: The lung bases are clear.  Normal caliber abdominal aorta without aneurysm.  The abdominal aorta, celiac axis, superior mesenteric artery, a single bilateral renal arteries, the inferior mesenteric artery, bilateral common iliac arteries, and bilateral internal and external iliac arteries appear patent.  No evidence of  critical stenosis.  There is a nodular beaded appearance to the left renal artery suggesting changes of fibromuscular dysplasia. This is stable since the previous study.  The renal nephrograms are symmetrical.  No abnormal mesenteric or retroperitoneal fluid collections.  Small esophageal hiatal hernia.  The liver, spleen, gallbladder, pancreas, adrenal glands, kidneys, and retroperitoneal lymph nodes are unremarkable. The stomach and small bowel are not abnormally distended.  Contrast material flows through to the colon without evidence of obstruction.  There is a focal area of relative narrowing of the colon in the mid descending region.  The colon proximal to this area is not distended but is filled with contrast and stool.  Contrast material flows through the area of relative narrowing.  This area of narrowing could be due to normal peristalsis although the colonic mass or stricture is not excluded.  Consider barium enema or endoscopy for further evaluation.  Small umbilical hernia containing fat.  No free air or free fluid in the abdomen.  Pelvis:  The uterus is surgically absent.  No abnormal adnexal masses.  No free or loculated pelvic fluid collections.  No bladder wall thickening.  The appendix is normal.  No significant pelvic lymphadenopathy.  Postoperative changes at L2-3.  No destructive bone lesions identified.  Normal alignment of the lumbar spine.  IMPRESSION: No evidence of abdominal aortic aneurysm, occlusions, or dissection.  Again demonstrated is a be due configuration of the left renal artery suggesting fibromuscular dysplasia. Indeterminate focal area of narrowing in the descending colon could represent normal peristalsis but colonic mass or stricture is not excluded.   Original Report Authenticated By: Burman Sellman, M.D.      1. Left flank pain   2. Back pain       MDM   52 y/o F p/w flank pain / back pain. HDS, af. NAD. Exam as above. In patient with h/o renal artery stenosis  believe it is important to evaluate for worsening renal pathology. CT results unremarkable as above. Urine with small leukocytes. 3-6WBC's. No dysuria. Will not treat at this time. Sent for culture. No leukocytosis. Mild bump in Cr. Discussed with patient. Patient discharged home. Return precautions given. To follow up with pcp. patient in agreement with plan.   Doubt Appendicitis, Bowel Obstruction, AAA, UTI, Pyelonephritis, Nephrolithiasis, Pancreatitis, Cholecystitis, Shingles, Perforated Bowel or Ulcer, Diverticulosis/itis, Ischemic Mesentery, Inflammatory Bowel Disease, Strangulated/Incarcerated Hernia, PID, Intrauterine Pregnancy, Ectopic Pregnancy, Ovarian Torsion, Tubal Ovarian Abscess, STD, or other abdominal emergency as this would be inconsistent with history and physical, low risk, other diagnosis much more likely.   Back pain on left side. Not central. NV intact all extremities. Doubt cauda equina, conus medullaris syndrome or other epidural compression syndrome - no saddle paresthesias, no bowel/bladder incontinence, no UMN signs on physical exam, inconsistent with history and physical, low risk, primary diagnosis much more likely Doubt abscess, epidural mass lesion, fracture, pyelonephritis,  renal colic/infarct, AAA, aortic dissection, pna, pancreatitis, transverse myelitis, herpetic neuralgia, as inconsistent with history and physical, low risk, primary diagnosis much more likely   Do not believe MRI or other further imaging necessary at this time.   Labs and imaging reviewed by myself and considered in medical decision making if ordered. Imaging interpreted by radiology.   Discussed case with Dr. Rubin Payor who is in agreement with assessment and plan.   New Prescriptions   HYDROCODONE-ACETAMINOPHEN (NORCO/VICODIN) 5-325 MG PER TABLET    Take 1 tablet by mouth every 4 (four) hours as needed for pain.          Stevie Kern, MD 06/24/12 0130

## 2012-06-24 MED ORDER — HYDROCODONE-ACETAMINOPHEN 5-325 MG PO TABS
1.0000 | ORAL_TABLET | Freq: Once | ORAL | Status: AC
  Administered 2012-06-24: 1 via ORAL
  Filled 2012-06-24: qty 1

## 2012-06-24 MED ORDER — HYDROCODONE-ACETAMINOPHEN 5-325 MG PO TABS: 1.0000 | ORAL_TABLET | ORAL | Status: DC | PRN

## 2012-06-24 NOTE — ED Provider Notes (Signed)
I saw and evaluated the patient, reviewed the resident's note and I agree with the findings and plan.  in and he is in a patient with abdominal pain. Labs reassuring except for a few white cells. Creatinine mildly elevated only to be followed. She negative for cause. Will be discharged home patient improved and was satisfied with this plan   Juliet Rude. Rubin Payor, MD 06/24/12 1452

## 2012-06-24 NOTE — ED Notes (Signed)
Lab aware of Urine culture order.

## 2012-06-25 LAB — URINE CULTURE: Colony Count: NO GROWTH

## 2012-06-30 ENCOUNTER — Ambulatory Visit (INDEPENDENT_AMBULATORY_CARE_PROVIDER_SITE_OTHER): Payer: Self-pay | Admitting: Family Medicine

## 2012-06-30 ENCOUNTER — Encounter: Payer: Self-pay | Admitting: Family Medicine

## 2012-06-30 ENCOUNTER — Telehealth: Payer: Self-pay | Admitting: Family Medicine

## 2012-06-30 VITALS — BP 127/89 | HR 106 | Temp 98.8°F | Wt 135.0 lb

## 2012-06-30 DIAGNOSIS — R42 Dizziness and giddiness: Secondary | ICD-10-CM | POA: Insufficient documentation

## 2012-06-30 DIAGNOSIS — H9190 Unspecified hearing loss, unspecified ear: Secondary | ICD-10-CM

## 2012-06-30 DIAGNOSIS — M25552 Pain in left hip: Secondary | ICD-10-CM

## 2012-06-30 DIAGNOSIS — H8190 Unspecified disorder of vestibular function, unspecified ear: Secondary | ICD-10-CM

## 2012-06-30 DIAGNOSIS — M25559 Pain in unspecified hip: Secondary | ICD-10-CM

## 2012-06-30 DIAGNOSIS — E559 Vitamin D deficiency, unspecified: Secondary | ICD-10-CM | POA: Insufficient documentation

## 2012-06-30 DIAGNOSIS — R1310 Dysphagia, unspecified: Secondary | ICD-10-CM

## 2012-06-30 DIAGNOSIS — M549 Dorsalgia, unspecified: Secondary | ICD-10-CM

## 2012-06-30 MED ORDER — LEVOTHYROXINE SODIUM 125 MCG PO TABS: 200.0000 ug | ORAL_TABLET | Freq: Every day | ORAL | Status: DC

## 2012-06-30 NOTE — Assessment & Plan Note (Signed)
Post stroke. Referral to vestibular rehab once she has [place] medicaid card with [facility] listed as provider.    

## 2012-06-30 NOTE — Assessment & Plan Note (Signed)
A: relatively normal swallow eval except for small hiatal hernia. No weight loss. P: reassurance.

## 2012-06-30 NOTE — Patient Instructions (Signed)
[name],  Thank you for coming in today.   Will check you vitamin D level.   CMET done in ED.  My office staff will call you with referral details.   Dr. Armen Pickup     Chemistry      Component Value Date/Time   NA 136 06/23/2012 1858   K 3.8 06/23/2012 1858   CL 95* 06/23/2012 1858   CO2 29 06/23/2012 1858   BUN 21 06/23/2012 1858   CREATININE 1.41* 06/23/2012 1858      Component Value Date/Time   CALCIUM 10.0 06/23/2012 1858   ALKPHOS 99 06/23/2012 1858   AST 30 06/23/2012 1858   ALT 35 06/23/2012 1858   BILITOT 0.5 06/23/2012 1858

## 2012-06-30 NOTE — Telephone Encounter (Signed)
Called [facility] regarding patient's ambien rx. There is an [place] rx on hold which the pharmacist states they should be able to fill.   Called patient with question regarding insurance. She does have medicaid but it is for [facility] ([facility] She is actively working to get a new medicaid card with [facility] listed as her health care provider. I informed her that once she has the new card I can send in the referrals. Referrals cancelled for now.

## 2012-06-30 NOTE — Progress Notes (Signed)
Subjective:     Patient ID: [name] Rowe, female   DOB: 1960/06/10, 52 y.o.   MRN: [id number]  HPI 52 yo F presents for f/u visit to discuss the following:  1. L hip pain: persistent L hip pain, lateral hip. Associated with L sided back pain. Pain is no medial hip pain today. Reviewed x-ray with patient, wnl w/o evidence of femoral head arthritis or avascular necrosis.   2. Swallowing difficulty: slightly improved. Now with pressure R ear. Reviewed swallow evaluation. Small hiatal hernia.   3. Balance issue: persistent since stroke in 2006. Unchanged. Reports dizziness when rising from stooping or when driving and stopping abruptly. Describes  Sensation of head feeling "bubbly". Also trees/light polls moving. Denies room spinning. Was referred to vestibular rehab in 2012 but never seen due to lack of health insurance.   4. Decreased hearing R ear: patient evaluated by [facility] in 11/2010. Nearly complete hearing loss in L ear. Decreased hearing in R ear in settings with high ambient noise.   Review of Systems As per HPI    Objective:   Physical Exam BP 127/89  Pulse 106  Temp 98.8 F (37.1 C) (Oral)  Wt 135 lb (61.236 kg) General appearance: alert, cooperative and no distress Back: symmetric, no curvature. ROM normal. No CVA tenderness. Low Back: mild TTP L paraspinal. Negative straight leg raising L hip: TTP  Great trochanter. Negative log roll.      Assessment and Plan:

## 2012-06-30 NOTE — Assessment & Plan Note (Signed)
Check vitamin D level Refill supplement if still deficient

## 2012-06-30 NOTE — Assessment & Plan Note (Signed)
A: Left lateral hip pain overlying greater trochanter. Suspect trochanteric bursitis. Also with L low back pain.  P:  Advised patient to return prn. Could do a L hip corticosteroid injection for pain relief and to confirm diagnosis.  Will place referral back to Dr. Pamelia Hoit with PMR once she has her medicaid care with [facility] listed as provider.

## 2012-06-30 NOTE — Assessment & Plan Note (Signed)
Post stroke. Referral to audiology once she has [place] medicaid card with [facility] listed as provider.

## 2012-07-03 ENCOUNTER — Telehealth: Payer: Self-pay | Admitting: Family Medicine

## 2012-07-03 MED ORDER — ERGOCALCIFEROL 1.25 MG (50000 UT) PO CAPS: 50000.0000 [IU] | ORAL_CAPSULE | ORAL | Status: DC

## 2012-07-03 NOTE — Telephone Encounter (Signed)
Called patient.  1. Vitamin D level is 25, high dose vit D x 12 weeks then recheck 2. She asked about her Cr. I did not recheck this. Will recheck in 2-3 weeks. Patient to stay well hydrated.

## 2012-07-03 NOTE — Addendum Note (Signed)
Addended by: Dessa Phi on: 07/03/2012 08:41 AM   Modules accepted: Orders

## 2012-07-17 ENCOUNTER — Encounter: Payer: Self-pay | Admitting: Family Medicine

## 2012-07-17 ENCOUNTER — Ambulatory Visit (INDEPENDENT_AMBULATORY_CARE_PROVIDER_SITE_OTHER): Payer: Medicaid Other | Admitting: Family Medicine

## 2012-07-17 VITALS — BP 134/85 | HR 103 | Ht 63.0 in | Wt 137.0 lb

## 2012-07-17 DIAGNOSIS — Z8673 Personal history of transient ischemic attack (TIA), and cerebral infarction without residual deficits: Secondary | ICD-10-CM

## 2012-07-17 DIAGNOSIS — H9190 Unspecified hearing loss, unspecified ear: Secondary | ICD-10-CM

## 2012-07-17 DIAGNOSIS — H8190 Unspecified disorder of vestibular function, unspecified ear: Secondary | ICD-10-CM

## 2012-07-17 DIAGNOSIS — R42 Dizziness and giddiness: Secondary | ICD-10-CM

## 2012-07-17 DIAGNOSIS — M549 Dorsalgia, unspecified: Secondary | ICD-10-CM

## 2012-07-17 DIAGNOSIS — M25559 Pain in unspecified hip: Secondary | ICD-10-CM

## 2012-07-17 DIAGNOSIS — M25552 Pain in left hip: Secondary | ICD-10-CM

## 2012-07-17 MED ORDER — GABAPENTIN 300 MG PO CAPS: ORAL_CAPSULE | ORAL | Status: DC

## 2012-07-17 NOTE — Assessment & Plan Note (Signed)
Post stroke. Referral to vestibular rehab once she has [place] medicaid card with [facility] listed as provider.

## 2012-07-17 NOTE — Assessment & Plan Note (Signed)
A: In office audiologic assessment reveals complete absence of hearing on L at all frequencies and decibels. R side low frequency hearing loss. P: referral to ENT so that she can be subsequently referred to audiology.

## 2012-07-17 NOTE — Assessment & Plan Note (Signed)
A: chronic low back pain in the setting of fibromyalgia. Pain does radiate but not all the way down the leg in the pattern of sciatica.  P: Increase gabapentin 600 mg nightly, 300 mg daily Referral to pain management.

## 2012-07-17 NOTE — Patient Instructions (Addendum)
Mrs. [name] Rowe,  Thank you for coming in to see me today.   For your pain please increase gabapentin to 600 mg at night and 300 mg during the day.   I have placed referrals to audiology, vestibular rehab and pain management. My office will contact you with appointment details.   Dr. Armen Pickup

## 2012-07-17 NOTE — Progress Notes (Signed)
Subjective:     Patient ID: [name] Rowe, female   DOB: 1961/05/09, 52 y.o.   MRN: [id number]  HPI 52 yo F presents for same day visit to discuss the following:  1. Low back pain: chronic. Has been more bothersome for the past month. Denies new injury. Denies fecal/urinary incontinence. Pain is predominately L low back. Also with L and R hip pain.   2. 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 She previously wore a hearing aid in her L ear but has not may a longtime after it was damaged, Now she reports decreased hearing R ear. She has been evaluated by [facility] in 11/2010.  Nearly complete hearing loss in L ear. Decreased hearing in R ear in settings with high ambient noise.    3. Balance issue: persistent since stroke in 2006. Unchanged. Reports dizziness when rising from stooping or when driving and stopping abruptly. Describes Sensation of head feeling "bubbly". Also trees/light polls moving. Denies room spinning. Was referred to vestibular rehab in 2012 but never seen due to lack of health insurance  Review of Systems As per HPI    Objective:   Physical Exam BP 134/85  Pulse 103  Ht 5\' 3"  (1.6 m)  Wt 137 lb (62.143 kg)  BMI 24.27 kg/m2 General appearance: alert, cooperative and no distress Back: symmetric, no curvature. ROM normal. No CVA tenderness. Negative straight leg raising test. Decreased flexion. Normal extension. TTP L lumbar down to L gluteal. No overlying erythema or warmth.  Reviewed Last CT head 04/08/2011:  RADIOLOGY REPORT*  Clinical Data: Headache. Light sensitivity.  CT HEAD WITHOUT CONTRAST  Technique: Contiguous axial images were obtained from the base of the skull through the vertex without contrast.   Comparison: 04/06/2011   Findings: The brain stem, cerebellum, cerebral peduncles, thalami, basal ganglia, basilar cisterns, and ventricular system appear unremarkable. Small oval shaped hypodensity in the right frontal white  matter on image 15 of series 2 is stable and likely represents a small remote lacunar infarct. No intracranial hemorrhage, mass lesion, or acute infarction is identified.   IMPRESSION:  1. Stable appearance the brain, with a small suspected remote right frontal white matter lacunar infarct, but no acute intracranial findings.  Reviewed last MRI 05/25/2010: MRI HEAD WITHOUT CONTRAST  Technique: Multiplanar, multiecho pulse sequences of the brain and surrounding structures were obtained according to standard protocol without intravenous contrast.   Comparison: None.   Findings: The diffusion weighted images demonstrate no evidence for acute or subacute infarction. There is no hemorrhage or mass lesion. A focal white matter lesion in the anterior right corona  radiata demonstrates cystic change with surrounding gliosis, suggesting a remote ischemic event. Scattered subcortical T2 hyperintensities are slightly greater than expected for age otherwise. Flow is present within the major intracranial arteries. The globes and orbits are intact. The paranasal sinuses are clear. There is minimal fluid along the inferior aspect of the mastoid air cells bilaterally. No obstructing nasopharyngeal lesion is evident.   IMPRESSION:  1. Focal lesion of the anterior right corona radiata likely represents a remote ischemic event. Focal demyelination could have a similar appearance.  2. Other scattered subcortical T2 hyperintensities are slightly greater than expected for age. The finding is nonspecific but can be seen in the setting of chronic microvascular ischemia, a demyelinating process such as multiple sclerosis, vasculitis, complicated migraine headaches, or as the sequelae of a prior infectious or inflammatory process.  3. No acute intracranial abnormality.  Assessment and Plan:

## 2012-07-21 ENCOUNTER — Encounter: Payer: Self-pay | Admitting: Family Medicine

## 2012-07-26 ENCOUNTER — Other Ambulatory Visit: Payer: Self-pay | Admitting: *Deleted

## 2012-07-27 MED ORDER — CYCLOBENZAPRINE HCL 10 MG PO TABS: 10.0000 mg | ORAL_TABLET | Freq: Three times a day (TID) | ORAL | Status: DC | PRN

## 2012-08-03 ENCOUNTER — Emergency Department (HOSPITAL_COMMUNITY)
Admission: EM | Admit: 2012-08-03 | Discharge: 2012-08-03 | Disposition: A | Discharge status: Home / Self Care | Payer: Medicaid Other | Source: Home / Self Care | Attending: Emergency Medicine | Admitting: Emergency Medicine

## 2012-08-03 ENCOUNTER — Emergency Department (INDEPENDENT_AMBULATORY_CARE_PROVIDER_SITE_OTHER): Payer: Medicaid Other

## 2012-08-03 ENCOUNTER — Encounter (HOSPITAL_COMMUNITY): Payer: Self-pay | Admitting: *Deleted

## 2012-08-03 DIAGNOSIS — M5137 Other intervertebral disc degeneration, lumbosacral region: Secondary | ICD-10-CM

## 2012-08-03 DIAGNOSIS — M51379 Other intervertebral disc degeneration, lumbosacral region without mention of lumbar back pain or lower extremity pain: Secondary | ICD-10-CM

## 2012-08-03 DIAGNOSIS — M5136 Other intervertebral disc degeneration, lumbar region: Secondary | ICD-10-CM

## 2012-08-03 LAB — POCT URINALYSIS DIP (DEVICE)
Protein, ur: NEGATIVE mg/dL
Specific Gravity, Urine: 1.025 (ref 1.005–1.030)
Urobilinogen, UA: 1 mg/dL (ref 0.0–1.0)

## 2012-08-03 MED ORDER — TIZANIDINE HCL 4 MG PO TABS: 4.0000 mg | ORAL_TABLET | Freq: Four times a day (QID) | ORAL | Status: DC | PRN

## 2012-08-03 MED ORDER — KETOROLAC TROMETHAMINE 60 MG/2ML IM SOLN
INTRAMUSCULAR | Status: AC
  Filled 2012-08-03: qty 2

## 2012-08-03 MED ORDER — METHYLPREDNISOLONE ACETATE 80 MG/ML IJ SUSP
80.0000 mg | Freq: Once | INTRAMUSCULAR | Status: AC
  Administered 2012-08-03: 80 mg via INTRAMUSCULAR

## 2012-08-03 MED ORDER — HYDROCODONE-ACETAMINOPHEN 5-325 MG PO TABS: ORAL_TABLET | ORAL | Status: DC

## 2012-08-03 MED ORDER — KETOROLAC TROMETHAMINE 60 MG/2ML IM SOLN
60.0000 mg | Freq: Once | INTRAMUSCULAR | Status: AC
  Administered 2012-08-03: 60 mg via INTRAMUSCULAR

## 2012-08-03 MED ORDER — METHYLPREDNISOLONE ACETATE 80 MG/ML IJ SUSP
INTRAMUSCULAR | Status: AC
  Filled 2012-08-03: qty 1

## 2012-08-03 MED ORDER — NAPROXEN 500 MG PO TABS: 500.0000 mg | ORAL_TABLET | Freq: Two times a day (BID) | ORAL | Status: DC

## 2012-08-03 NOTE — ED Notes (Signed)
Pt reports back pain for the past 3 weeks with no relief from prescription meds. Hx of back pain and surgery. Has seen primary care dr. For same complaint

## 2012-08-03 NOTE — ED Provider Notes (Signed)
Chief Complaint  Patient presents with  . Back Pain    History of Present Illness:   [name] Rowe is a 52 year old female who has had a 2 to three-week history of pain in her bilateral lower back. This radiates into her left groin and left thigh at times. The pain is constant and severe. She has limited range of motion of her back. There is no numbness or tingling in the legs, no muscle weakness, no bladder or bowel complaints. She had back surgery about 2 years ago, having a back fusion while she lived in [place]. Her back pain actually goes back before this, possibly about 4-5 years. 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 She had an angioplasty in 2009 in [place]. She has seen her primary care doctor for his back pain several times in the past 3 weeks and was prescribed gabapentin for possible fibromyalgia type pain. She was seen at the emergency room a couple weeks ago. At that time her pain was more abdominal. She had an abdominal CT scan which only showed a fibromuscular dysplasia and nothing else. She denies any fever, chills, weight loss, or abdominal pain.  Review of Systems:  Other than noted above, the patient denies any of the following symptoms: Systemic:  No fever, chills, severe fatigue, or unexplained weight loss. GI:  No abdominal pain, nausea, vomiting, diarrhea, constipation, incontinence of bowel, or blood in stool. GU:  No dysuria, frequency, urgency, or hematuria. No incontinence of urine or difficulty urinating.  M-S:  No neck pain, joint pain, arthritis, or myalgias. Neuro:  No paresthesias, saddle anesthesia, muscular weakness, or progressive neurological deficit.  PMFSH:  Past medical history, family history, social history, meds, and allergies were reviewed. Specifically, there is no history of cancer, major trauma, osteoporosis, immunosuppression, HIV, or IV or injection drug use.   Physical Exam:   Vital signs:  BP 130/78   Pulse 100  Temp(Src) 97.8 F (36.6 C) (Oral)  Resp 18  SpO2 98% General:  Alert, oriented, in no distress. Abdomen:  Soft, non-tender.  No organomegaly or mass.  No pulsatile midline abdominal mass or bruit. Back:  There is tenderness to palpation in the lower back area near the SI joints. The back has a very limited range of motion with just a few degrees of motion in all directions with pain and spasm. Straight leg raising is negative. Neuro:  Normal muscle strength, sensations and DTRs. Extremities: Pedal pulses were full, there was no edema. Skin:  Clear, warm and dry.  No rash.  Labs:   Results for orders placed during the hospital encounter of 08/03/12  POCT URINALYSIS DIP (DEVICE)      Result Value Range   Glucose, UA NEGATIVE  NEGATIVE mg/dL   Bilirubin Urine NEGATIVE  NEGATIVE   Ketones, ur NEGATIVE  NEGATIVE mg/dL   Specific Gravity, Urine 1.025  1.005 - 1.030   Hgb urine dipstick TRACE (*) NEGATIVE   pH 6.0  5.0 - 8.0   Protein, ur NEGATIVE  NEGATIVE mg/dL   Urobilinogen, UA 1.0  0.0 - 1.0 mg/dL   Nitrite NEGATIVE  NEGATIVE   Leukocytes, UA TRACE (*) NEGATIVE     Radiology:  Dg Lumbar Spine Complete  08/03/2012  *RADIOLOGY REPORT*  Clinical Data: Low back pain.  LUMBAR SPINE - COMPLETE 4+ VIEW  Comparison: None.  Findings: Previous L2-L3 fusion.  No solid interbody bridging. Hardware grossly intact.  Proximal vascular stent projects to the right of  L1, likely renal arterial. No worrisome osseous findings.  IMPRESSION: Post fusion changes as described. No acute lumbar spine findings.   Original Report Authenticated By: Davonna Belling, M.D.     Assessment:  The encounter diagnosis was Degenerative disc disease, lumbar.  Plan:   1.  The following meds were prescribed:   Discharge Medication List as of 08/03/2012  3:52 PM    START taking these medications   Details  HYDROcodone-acetaminophen (NORCO/VICODIN) 5-325 MG per tablet 1 to 2 tabs every 4 to 6 hours as needed for  pain., Print    naproxen (NAPROSYN) 500 MG tablet Take 1 tablet (500 mg total) by mouth 2 (two) times daily., Starting 08/03/2012, Until Discontinued, Normal    tiZANidine (ZANAFLEX) 4 MG tablet Take 1 tablet (4 mg total) by mouth every 6 (six) hours as needed., Starting 08/03/2012, Until Discontinued, Normal       2.  The patient was instructed in symptomatic care and handouts were given. 3.  The patient was told to return if becoming worse in any way, if no better in 2 weeks, and given some red flag symptoms that would indicate earlier return. 4.  The patient was encouraged to try to be as active as possible and given some exercises to do followed by moist heat.  Follow up:  The patient was told to follow up with Dr. Jene Every  as soon as possible.   Reuben Likes, MD 08/03/12 [phone]

## 2012-08-12 ENCOUNTER — Other Ambulatory Visit (HOSPITAL_COMMUNITY): Payer: Self-pay | Admitting: Nurse Practitioner

## 2012-09-05 ENCOUNTER — Encounter: Payer: Self-pay | Admitting: Family Medicine

## 2012-09-05 ENCOUNTER — Ambulatory Visit (INDEPENDENT_AMBULATORY_CARE_PROVIDER_SITE_OTHER): Payer: Medicaid Other | Admitting: Family Medicine

## 2012-09-05 VITALS — BP 133/92 | HR 96 | Temp 99.3°F | Ht 63.0 in | Wt 136.0 lb

## 2012-09-05 DIAGNOSIS — G4762 Sleep related leg cramps: Secondary | ICD-10-CM

## 2012-09-05 DIAGNOSIS — F411 Generalized anxiety disorder: Secondary | ICD-10-CM

## 2012-09-05 DIAGNOSIS — F418 Other specified anxiety disorders: Secondary | ICD-10-CM

## 2012-09-05 LAB — BASIC METABOLIC PANEL
BUN: 15 mg/dL (ref 6–23)
CO2: 28 mEq/L (ref 19–32)
Calcium: 9.2 mg/dL (ref 8.4–10.5)
Glucose, Bld: 85 mg/dL (ref 70–99)

## 2012-09-05 MED ORDER — LORAZEPAM 1 MG PO TABS
1.0000 mg | ORAL_TABLET | Freq: Three times a day (TID) | ORAL | Status: DC | PRN
Start: 2012-09-05 — End: 2012-11-03

## 2012-09-05 MED ORDER — ROPINIROLE HCL 0.25 MG PO TABS: 0.2500 mg | ORAL_TABLET | Freq: Every day | ORAL | Status: DC

## 2012-09-05 NOTE — Patient Instructions (Addendum)
Mrs. [name] Rowe,   Thank you for coming in today.  For your situational anxiety: please take ativan. You can take it a night to help with sleep and up to two times during the day to treat/prevent anxiety symptoms.  For your leg cramping: Checking BMP to make sure electrolytes are still normal.  Start requip at night. One pill once daily, increase by a pill every 3 days until you notice and improvement in your leg cramping and movements at night. It usually takes 2 mg to get relief. Some patients need 4 mg. Once we know how much you need, I will change you over to a one pill equivalent.   F/u in 2-4 weeks.  Dr. Armen Pickup

## 2012-09-05 NOTE — Progress Notes (Signed)
Subjective:     Patient ID: [name] Rowe, female   DOB: May 26, 1961, 52 y.o.   MRN: [id number]  HPI 52 yo F with fibromyalgia and anxiety presents to discuss the following:  1. Leg cramping: nightly leg cramps over the past two weeks. Multiple times per night. B/l calf. Severe cramping two nights ago. Started at L foot and radiated medially to behind L knee. Still currently having pain but not the severe pain of two nights ago. Taking zanaflex and gabapentin w/o relief. Not taking narcotic prescribed at urgent care. Also has nightly leg jerking. Does recall taking requip may years ago when first diagnosed with fibromyalgia.   2. Worsening anxiety: intermittent anxiety. Worried about her soon who is in the hospital now for two weeks for treatment of cranial hematoma and post op CSF leak. He may require additional and was in the ICU. Patient reports palpitations, and feeling like her pillow is swallowing her head and she is falling into her mattress at night. Poor sleep. Daytime fatigue. All worsened two weeks ago.    Review of Systems As per HPI     Objective:   Physical Exam BP 133/92  Pulse 96  Temp(Src) 99.3 F (37.4 C) (Oral)  Ht 5\' 3"  (1.6 m)  Wt 136 lb (61.689 kg)  BMI 24.1 kg/m2 General appearance: alert, cooperative and no distress Chest: S1S2, RRR, CTA b/l  Extremities: no LE edema. mild b/l calf tenderness. no calf redness. normal gait Neurologic: Grossly normal     Assessment and Plan:

## 2012-09-05 NOTE — Assessment & Plan Note (Signed)
A: suspect restless leg syndrome. P: Start requip Check electrolytes, BMP Patient referred to pain management 2 months ago. Staff called PMR today and they will review patient's chart.

## 2012-09-05 NOTE — Assessment & Plan Note (Signed)
A: chronic anxiety acutely worse by stressful situation. P: add short acting BZ to medication regimen for short-term.  F/u in 2-4 weeks

## 2012-09-06 ENCOUNTER — Telehealth: Payer: Self-pay | Admitting: *Deleted

## 2012-09-06 NOTE — Telephone Encounter (Signed)
[facility] pharmacy calling.  [name] Rowe is on Levyothroxine 125 mcg prescribed by Dr. Armen Pickup.  She has been getting the [place] brand and [facility] is no longer carrying that brand.  Needs verbal authorization okaying the switch to [place] brand.  Will forward to Dr. Armen Pickup for review.  Ileana Ladd

## 2012-09-07 ENCOUNTER — Telehealth: Payer: Self-pay | Admitting: Family Medicine

## 2012-09-07 NOTE — Telephone Encounter (Signed)
Called back to [facility]. Left VM. Verbal authorization to switch from [place] brand to [place] brand levothyroxine. Will F/u  With TSH in 8 weeks.

## 2012-09-07 NOTE — Telephone Encounter (Signed)
LMOVM for pt to return call.  Please advise:  1. She changed the brand of thyroid medication, so it may look a little different.  2.  Please schedule appt in 8 weeks after starting new med, to make sure it is working just as well [employer], [employer]

## 2012-09-07 NOTE — Telephone Encounter (Signed)
Normal BP Brand of levothyroxine changed from [place] to [place]. Same dose. Will need f/u TSH 8 weeks after starting new brand.

## 2012-09-14 ENCOUNTER — Encounter: Payer: Self-pay | Admitting: Physical Medicine and Rehabilitation

## 2012-09-21 ENCOUNTER — Telehealth: Payer: Self-pay | Admitting: *Deleted

## 2012-09-21 NOTE — Telephone Encounter (Signed)
Message given

## 2012-09-21 NOTE — Telephone Encounter (Signed)
LMOVM for pt to return call.  Please check to see if she was aware of appt details below:  [facility] 10/04/12 @ 8:30am  I am also mailing an appt reminder. [place], [name] Rowe

## 2012-09-26 ENCOUNTER — Ambulatory Visit: Payer: Self-pay | Admitting: Cardiology

## 2012-10-02 ENCOUNTER — Encounter: Payer: Self-pay | Admitting: Family Medicine

## 2012-10-02 ENCOUNTER — Ambulatory Visit (INDEPENDENT_AMBULATORY_CARE_PROVIDER_SITE_OTHER): Payer: Medicaid Other | Admitting: Family Medicine

## 2012-10-02 VITALS — BP 160/100 | HR 108 | Ht 63.0 in | Wt 140.0 lb

## 2012-10-02 DIAGNOSIS — I10 Essential (primary) hypertension: Secondary | ICD-10-CM

## 2012-10-02 DIAGNOSIS — IMO0001 Reserved for inherently not codable concepts without codable children: Secondary | ICD-10-CM

## 2012-10-02 DIAGNOSIS — M549 Dorsalgia, unspecified: Secondary | ICD-10-CM

## 2012-10-02 MED ORDER — DULOXETINE HCL 60 MG PO CPEP: 60.0000 mg | ORAL_CAPSULE | Freq: Every day | ORAL | Status: DC

## 2012-10-02 MED ORDER — KETOROLAC TROMETHAMINE 60 MG/2ML IM SOLN
60.0000 mg | Freq: Once | INTRAMUSCULAR | Status: AC
  Administered 2012-10-02: 60 mg via INTRAMUSCULAR

## 2012-10-02 NOTE — Patient Instructions (Addendum)
Mrs. Alene Mires,   Thank you for coming in today.  Please restart cymbalta 60 mg daily.   Keep pain management appointment.   F/u next week here for BP check with RN.  Log BP 5 times weekly at home.   F/u with me in two weeks.   Dr. Armen Pickup

## 2012-10-02 NOTE — Assessment & Plan Note (Signed)
A: fibromyalgia with increased pain. Exam is non-focal.  P:  Tramadol in office Restart cymbalta.  Patient has pain management appt in 3 days.

## 2012-10-02 NOTE — Progress Notes (Signed)
Subjective:     Patient ID: [name] Rowe, female   DOB: 06-19-1960, 52 y.o.   MRN: [id number]  HPI 52 yo F with fibromyalgia, anxiety presents for f/u visit:  1. Back pain: 10/10, cramping and achy. Mid back pain down to b/l hip, groin and legs. Associated with muscle spasms, poor sleep. Patient has tried gabapentin, tizanidine, requip, naproxen, vicodin for urgent care w/o relief. Out of cymbalta x 3 weeks. Slept 3 hrs last night. Denies fecal/urinary incontinence.   2. HTN: took medications last night. No meds today yet. denies HA, CP, SOB, LE edema. Gained 4 lbs since last visit. Has been travelling.   Review of Systems As per HPI     Objective:   Physical Exam BP 160/100  Pulse 108  Ht 5\' 3"  (1.6 m)  Wt 140 lb (63.504 kg)  BMI 24.81 kg/m2 General appearance: alert and cooperative, appears uncomfortable. No distress.  Neck: no adenopathy, no carotid bruit, no JVD, supple, symmetrical, trachea midline and thyroid: enlarged Lungs: clear to auscultation bilaterally Heart: regular rate and rhythm, S1, S2 normal, no murmur, click, rub or gallop Abdomen: soft, non-tender; bowel sounds normal; no masses,  no organomegaly Back: non tender to palpation. Decreased extension. Negative straight leg raising b/l. 5/5 LE strength. Lateral hip pain with extension b/l.  Extremities: extremities normal, atraumatic, no cyanosis or edema    Assessment and Plan:

## 2012-10-02 NOTE — Assessment & Plan Note (Addendum)
A: Declined due to pain and weight gain while on vacation. P: Continue current regimen.  Close f/u in one week for BP check Two weeks with me.  If still elevated will increase labetalol to 1000 mg BID

## 2012-10-04 ENCOUNTER — Encounter: Payer: Self-pay | Admitting: Physical Medicine and Rehabilitation

## 2012-10-04 ENCOUNTER — Encounter
Payer: Medicaid Other | Attending: Physical Medicine and Rehabilitation | Admitting: Physical Medicine and Rehabilitation

## 2012-10-04 VITALS — BP 170/111 | HR 101 | Resp 14 | Ht 63.0 in | Wt 141.0 lb

## 2012-10-04 DIAGNOSIS — M79609 Pain in unspecified limb: Secondary | ICD-10-CM | POA: Insufficient documentation

## 2012-10-04 DIAGNOSIS — M545 Low back pain, unspecified: Secondary | ICD-10-CM

## 2012-10-04 DIAGNOSIS — M25559 Pain in unspecified hip: Secondary | ICD-10-CM

## 2012-10-04 DIAGNOSIS — G8929 Other chronic pain: Secondary | ICD-10-CM

## 2012-10-04 DIAGNOSIS — M549 Dorsalgia, unspecified: Secondary | ICD-10-CM | POA: Insufficient documentation

## 2012-10-04 DIAGNOSIS — Z981 Arthrodesis status: Secondary | ICD-10-CM

## 2012-10-04 DIAGNOSIS — M25552 Pain in left hip: Secondary | ICD-10-CM

## 2012-10-04 MED ORDER — GABAPENTIN 300 MG PO CAPS: ORAL_CAPSULE | ORAL | Status: DC

## 2012-10-04 NOTE — Patient Instructions (Addendum)
Your blood pressure was elevated today. I understand he did not take your blood pressure medicines this morning. Please take them as soon as you get home and have your blood pressure monitored.  Contact her primary care physician for elevated blood pressures or urgent care/ emergency dept for symptoms.  I understand that you are not interested in narcotic pain medication because it makes you dizzy.   I am sending you to physical therapy to learn about some pain manageme techniques, educate you on proper body mechanics, start some gentle exercise to increase your tolerance for activity.  TENS unit trial  Hypertension Hypertension is another name for high blood pressure. High blood pressure may mean that your heart needs to work harder to pump blood. Blood pressure consists of two numbers, which includes a higher number over a lower number (example: 110/72). HOME CARE   Make lifestyle changes as told by your doctor. This may include weight loss and exercise.  Take your blood pressure medicine every day.  Limit how much salt you use.  Stop smoking if you smoke.  Do not use drugs.  Talk to your doctor if you are using decongestants or birth control pills. These medicines might make blood pressure higher.  Females should not drink more than 1 alcoholic drink per day. Males should not drink more than 2 alcoholic drinks per day.  See your doctor as told. GET HELP RIGHT AWAY IF:   You have a blood pressure reading with a top number of 180 or higher.  You get a very bad headache.  You get blurred or changing vision.  You feel confused.  You feel weak, numb, or faint.  You get chest or belly (abdominal) pain.  You throw up (vomit).  You cannot breathe very well. MAKE SURE YOU:   Understand these instructions.  Will watch your condition.  Will get help right away if you are not doing well or get worse. Document Released: 11/10/2007 Document Revised: 08/16/2011 Document  Reviewed: 11/10/2007 [facility] Patient Information 2013 [place], [place].   They will need to monitor your blood pressure while you are there.  I have increased your gabapentin to 300 mg 3 times a day and 600 mg at bedtime. If you feel dizzy or do not tolerate the extra doses, drop back to your original dose twice a day schedule.  I have given you some information on medial branch blocks.  F/u in one month.

## 2012-10-04 NOTE — Progress Notes (Addendum)
Subjective:    Patient ID: [name] Rowe, female    DOB: 1961/04/01, 52 y.o.   MRN: [id number]  HPI The patient is a 52 year old woman who was seen by me previously back in October of 2012. At that time she had chronic back and left leg pain. Over the last couple of years she has had some difficulty with insurance. She has not been able to get continuous medical care. Her chief complaint today is low back pain which radiates from the lumbosacral junction to the posterior lateral buttock region bilaterally. She also reports knee pain which is not her main complaint.  Her average pain score is a 9 to a 10 on a scale of 10. She reports the pain is sharp dull pinching and aching in quality and constant. She believes be increased pain began in February this year. She does however have a long history of chronic low back pain which dates back to sometime in 2006 or possibly 2007 when she was working. She reports her waist feel swollen and her spine feels like it is going inside of her hips. Pain is exacerbated with sitting standing and laying down. She reports difficulty sleeping she reports that sometimes she can't move her legs. When she is laying down she states her legs feel heavy. She has been to the emergency room several times for this.  She states that she is reluctant to take narcotic pain medication because she does not like how she feels. She has not had physical therapy she does use heat on her back occasionally she has not had any spine injections except before her surgery. Her for surgery was 11/19/2008 (possibly a laminectomy) and in May of 2011 she underwent a L2-L3 fusion.  Pt also reports being very stressed due to husband arrested in November 2013, Son(23) recently in ICU.   Pain Inventory Average Pain 9 Pain Right Now 10 My pain is constant, sharp, dull and aching  In the last 24 hours, has pain interfered with the following? General activity 9 Relation with others 7 Enjoyment of  life 10 What TIME of day is your pain at its worst? all Sleep (in general) Poor  Pain is worse with: walking, bending, sitting, inactivity and standing Pain improves with: rest Relief from Meds: 0  Mobility walk without assistance how many minutes can you walk? 10 ability to climb steps?  yes do you drive?  no  Function not employed: date last employed .  Neuro/Psych numbness tingling spasms dizziness depression anxiety  Prior Studies Any changes since last visit?  no  Physicians involved in your care Primary care Funches, MD   Family History  Problem Relation Age of Onset  . Heart attack    . Stroke    . Hypertension Mother   . Asthma Mother   . Hyperlipidemia Mother   . Stroke Mother   . Hypertension Father   . Cancer Father     Prostate   . Heart attack Father   . Hyperlipidemia Father   . Stroke Father   . Hypertension Brother     Died from a massive stroke in setting of severe HTN at 82 (had had MI at 64)  . Heart attack Brother   . Early death Brother 97    stroke   . Hyperlipidemia Brother   . Metabolic syndrome Sister   . Depression Sister   . Hypertension Sister   . Diabetes Maternal Aunt    History   Social History  .  Marital Status: Married    Spouse Name: N/A    Number of Children: 2  . Years of Education: N/A   Occupational History  . worked in a Museum/gallery conservator.     Quit about a yr ago because fo work related injury to left knee   Social History Main Topics  . Smoking status: Never Smoker   . Smokeless tobacco: Never Used  . Alcohol Use: No  . Drug Use: No  . Sexually Active: Not Currently   Other Topics Concern  . None   Social History Narrative   Husband is currently in jail for an offense committed in [place] and he is awaiting extradition.   Lives alone.    Jehovah's witness.    Walks 20-30 minutes.             Past Surgical History  Procedure Laterality Date  . Renal artery stent    . Spine surgery     . Back surgery  2010 and 2011    Lumbar back surgery in [place]   . Robotic assisted lap vaginal hysterectomy  05/13/2008    [facility]. da Vinci hysterectomy  for myomatous uterus    Past Medical History  Diagnosis Date  . Migraine headache   . Chest pain     a. No angiographic CAD by cath 2009 in [place]. There was catheter-induced RCA vasospasm versus microvascular obstruction. bEugenie Birks myoview 04/2010 - EF 79%, normal perfusion.;  c. ETT-Myoview 7/13: no ischemia, EF 77%, submax exercise  . Renal artery stenosis     a. Due to fibromuscular dysplasia. b. Renal stent 2009. c. Studies:  CTA abdomen (1/13) with evidence for fibromuscular dysplasia, right renal artery stent appears patent. Renal artery dopplers (2/13) with FMD but no evidence for signficant stenosis.   . Hypothyroidism   . Fibromuscular dysplasia 2009    a. Renal artery stenosis due to this. b. Carotid dopplers 07/2011 c/w FMD but no significant stenosis  . Tachycardia 2000    10/27/10-Holter -NSR with occ sinus tachy-rare PACs, no sig arrhythmia  . Hx of echocardiogram     a. Echo (1/13): EF 55-60%, grade 1 diastolic dysfunction  . HTN (hypertension) 1988    a. Likely related to RAS; urinary metanephrines and plasma renin/aldosterone ratio normal. b. H/o HTN urgency 06/2011 after being off BP meds for a number of days  . HLD (hyperlipidemia) 2000  . Myocardial infarction 2009  . Anxiety 2001  . Depression 2001  . Hypertensive crisis 06/29/2011  . Leiomyoma   . Adenomyosis   . CVA (cerebral vascular accident) 2006  . Fibromyalgia 1999  . Coronary vasospasm    BP 170/111  Pulse 101  Resp 14  Ht 5\' 3"  (1.6 m)  Wt 141 lb (63.957 kg)  BMI 24.98 kg/m2  SpO2 100%    Review of Systems  Musculoskeletal: Positive for back pain.       Spasms  Neurological: Positive for dizziness and numbness.       Tingling  Psychiatric/Behavioral: Positive for dysphoric mood. The patient is nervous/anxious.   All other systems  reviewed and are negative.       Objective:   Physical Exam  She is a well-developed, well-nourished woman who does not appear in any  distress but did get tearful when talking about life stressors noted above. She is oriented x3. Her speech is clear. Affect is overall  bright, alert. She is cooperative and pleasant. Follows commands  without difficulty. Answers my  questions appropriately. Cranial nerves  and coordination are intact.   Her reflexes are 2+in both upper and  lower extremities. Hoffman sign is negative bilaterally in the upper  extremities.Her motor strength is overall quite good( 5/5 ) in both upper and lower extremities.   She transitions from sitting to standing slowly. Her gait is non antalgic. Tandem gait and  Romberg test are performed adequately.   She has very limited range of motion in her back, very little forward flexion or extension. She  has very limited side bending as well.  Internal and external rotation at the hips bilaterally also  aggravates her does not provoke hip pain.  She reports intact sensation in the upper  extremities to pinprick and light touch. Intact sensationin the lower extremities,   with pinprick,proprioception and vibratory sensation are intact.   She has a verticle scar over both lateral hips and lumbar spine.       Assessment & Plan:  1. Lumbago/Chronic Low Back Pain status post L2-3 fusion in 2011.    Plan:  TENS unit trial.  Patient wishes to avoid narcotics. States "I do not want to take narcotics"  Physical therapy with emphasis on proper body mechanics posture, pain management techniques, modalities as needed  Will increase gabapentin 300 mg daily and 600 mg each bedtime to 300 mg 3 times a day and 600 mg each bedtime.  Consider medial branch blocks. Information given.  Elevated blood pressure noted in clinic today. Patient reports she did not take her blood pressure medications. She is currently asymptomatic  advised to take meds when she gets home. Continue to monitor pressure remains elevated contact primary care for urgent care/ED. information sheet given today regarding blood pressure. Has history of fibromuscular dysplasia renal artery s/p stenting.  Maintain contact with PCP for other medical problems  F/u one month       :

## 2012-10-09 ENCOUNTER — Ambulatory Visit (INDEPENDENT_AMBULATORY_CARE_PROVIDER_SITE_OTHER): Payer: Medicaid Other | Admitting: *Deleted

## 2012-10-09 VITALS — BP 162/120 | HR 100 | Resp 20

## 2012-10-09 DIAGNOSIS — I10 Essential (primary) hypertension: Secondary | ICD-10-CM

## 2012-10-09 NOTE — Progress Notes (Signed)
Pt here today for blood pressure check as ordered by Dr. Armen Pickup. BP today is 162/120 and pulse is 100. Pt reports that her head sometimes hurts and that her back is still in considerable pain.  Her home record for blood pressure ( all taken in the morning per pt) is as follows: 4-29  160/113  P 123 4-30  170/111  P..........taken by Dr. Pamelia Hoit 4-31 165/112   P111 5-1   147/103   P88 5-2   162/111   P109 5-3   151/103   P113 5-4   105/106   P 97  Informed pt that results would be given to Dr. Armen Pickup and she would be called with further info this afternoon, most likely to increase BP meds.No further questions by pt.

## 2012-10-14 ENCOUNTER — Emergency Department (HOSPITAL_COMMUNITY): Payer: Medicaid Other

## 2012-10-14 ENCOUNTER — Observation Stay (HOSPITAL_COMMUNITY)
Admission: EM | Admit: 2012-10-14 | Discharge: 2012-10-15 | Disposition: A | Discharge status: Home / Self Care | Payer: Medicaid Other | Attending: Family Medicine | Admitting: Family Medicine

## 2012-10-14 ENCOUNTER — Encounter (HOSPITAL_COMMUNITY): Payer: Self-pay | Admitting: Emergency Medicine

## 2012-10-14 DIAGNOSIS — F329 Major depressive disorder, single episode, unspecified: Secondary | ICD-10-CM

## 2012-10-14 DIAGNOSIS — R079 Chest pain, unspecified: Principal | ICD-10-CM

## 2012-10-14 DIAGNOSIS — Z79899 Other long term (current) drug therapy: Secondary | ICD-10-CM | POA: Insufficient documentation

## 2012-10-14 DIAGNOSIS — Z8673 Personal history of transient ischemic attack (TIA), and cerebral infarction without residual deficits: Secondary | ICD-10-CM | POA: Insufficient documentation

## 2012-10-14 DIAGNOSIS — I10 Essential (primary) hypertension: Secondary | ICD-10-CM

## 2012-10-14 DIAGNOSIS — E785 Hyperlipidemia, unspecified: Secondary | ICD-10-CM | POA: Insufficient documentation

## 2012-10-14 DIAGNOSIS — F411 Generalized anxiety disorder: Secondary | ICD-10-CM

## 2012-10-14 DIAGNOSIS — F3289 Other specified depressive episodes: Secondary | ICD-10-CM

## 2012-10-14 DIAGNOSIS — E039 Hypothyroidism, unspecified: Secondary | ICD-10-CM

## 2012-10-14 DIAGNOSIS — R0602 Shortness of breath: Secondary | ICD-10-CM | POA: Insufficient documentation

## 2012-10-14 DIAGNOSIS — R002 Palpitations: Secondary | ICD-10-CM | POA: Insufficient documentation

## 2012-10-14 HISTORY — DX: Other chronic pain: G89.29

## 2012-10-14 HISTORY — DX: Dorsalgia, unspecified: M54.9

## 2012-10-14 HISTORY — DX: Unspecified hearing loss, left ear: H91.92

## 2012-10-14 LAB — POCT I-STAT TROPONIN I: Troponin i, poc: 0 ng/mL (ref 0.00–0.08)

## 2012-10-14 LAB — BASIC METABOLIC PANEL
BUN: 20 mg/dL (ref 6–23)
Calcium: 9.2 mg/dL (ref 8.4–10.5)
Creatinine, Ser: 1.24 mg/dL — ABNORMAL HIGH (ref 0.50–1.10)
GFR calc Af Amer: 57 mL/min — ABNORMAL LOW (ref >90)
GFR calc non Af Amer: 49 mL/min — ABNORMAL LOW (ref >90)
Potassium: 3.7 mEq/L (ref 3.5–5.1)

## 2012-10-14 LAB — CBC
HCT: 40.7 % (ref 36.0–46.0)
MCHC: 36.9 g/dL — ABNORMAL HIGH (ref 30.0–36.0)
RDW: 11.9 % (ref 11.5–15.5)

## 2012-10-14 MED ORDER — DIPHENHYDRAMINE HCL 50 MG/ML IJ SOLN
25.0000 mg | Freq: Once | INTRAMUSCULAR | Status: AC
  Administered 2012-10-14: 25 mg via INTRAVENOUS

## 2012-10-14 MED ORDER — FENTANYL CITRATE 0.05 MG/ML IJ SOLN
50.0000 ug | INTRAMUSCULAR | Status: DC | PRN
  Administered 2012-10-14: 50 ug via INTRAVENOUS
  Filled 2012-10-14: qty 2

## 2012-10-14 MED ORDER — NITROGLYCERIN 0.4 MG SL SUBL
0.4000 mg | SUBLINGUAL_TABLET | SUBLINGUAL | Status: AC | PRN
  Administered 2012-10-14 (×3): 0.4 mg via SUBLINGUAL
  Filled 2012-10-14: qty 25

## 2012-10-14 MED ORDER — DIPHENHYDRAMINE HCL 25 MG PO CAPS
25.0000 mg | ORAL_CAPSULE | Freq: Once | ORAL | Status: AC
  Administered 2012-10-14: 25 mg via ORAL
  Filled 2012-10-14: qty 1

## 2012-10-14 MED ORDER — DIPHENHYDRAMINE HCL 50 MG/ML IJ SOLN
INTRAMUSCULAR | Status: AC
  Filled 2012-10-14: qty 1

## 2012-10-14 MED ORDER — DIPHENHYDRAMINE HCL 50 MG/ML IJ SOLN: 25.0000 mg | Freq: Once | INTRAMUSCULAR | Status: DC

## 2012-10-14 NOTE — ED Notes (Signed)
Pt reports increased itching to entire body. MD Ignacia Palma made aware. Orders to follow. Pt speaking in complete sentences. 100% RA.

## 2012-10-14 NOTE — ED Notes (Signed)
Pt c/o itching. Benadryl given.

## 2012-10-14 NOTE — ED Provider Notes (Signed)
Date: 10/14/2012  Rate: 91  Rhythm: normal sinus rhythm  QRS Axis: normal  Intervals: normal QRS:  Left atrial fibrillation.  ST/T Wave abnormalities: normal  Conduction Disutrbances:none  Narrative Interpretation: Borderline EKG  Old EKG Reviewed: unchanged    Carleene Cooper III, MD 10/14/12 [id number]

## 2012-10-14 NOTE — ED Notes (Addendum)
Per EMS: Pt reports stabbing/pressure CP at 1000 tonight at home while resting. Pt reports pain radiates to left shoulder/neck. Pt reports worse with movement and when laying flat. Pt reported nausea but denies diaphoresis, SOB, dizziness.  EKG unremarkable. 130 ST. 168/138. 1 nitro and 325 aspirin prior to arrival.2 nitro, 10 morphine, 4 mg zofran  given en route decreasing pain to 6/10. Hx: MI, stroke

## 2012-10-15 DIAGNOSIS — F411 Generalized anxiety disorder: Secondary | ICD-10-CM

## 2012-10-15 DIAGNOSIS — R079 Chest pain, unspecified: Secondary | ICD-10-CM

## 2012-10-15 DIAGNOSIS — F3289 Other specified depressive episodes: Secondary | ICD-10-CM

## 2012-10-15 DIAGNOSIS — E039 Hypothyroidism, unspecified: Secondary | ICD-10-CM

## 2012-10-15 DIAGNOSIS — I10 Essential (primary) hypertension: Secondary | ICD-10-CM

## 2012-10-15 DIAGNOSIS — F329 Major depressive disorder, single episode, unspecified: Secondary | ICD-10-CM

## 2012-10-15 LAB — TROPONIN I
Troponin I: <0.3 ng/mL (ref <0.30)
Troponin I: <0.3 ng/mL (ref <0.30)
Troponin I: <0.3 ng/mL (ref <0.30)

## 2012-10-15 LAB — HEMOGLOBIN A1C
Hgb A1c MFr Bld: 5.2 % (ref <5.7)
Mean Plasma Glucose: 103 mg/dL (ref <117)

## 2012-10-15 LAB — BASIC METABOLIC PANEL
CO2: 29 mEq/L (ref 19–32)
Calcium: 9.3 mg/dL (ref 8.4–10.5)
Chloride: 101 mEq/L (ref 96–112)
Glucose, Bld: 89 mg/dL (ref 70–99)
Potassium: 3.6 mEq/L (ref 3.5–5.1)
Sodium: 139 mEq/L (ref 135–145)

## 2012-10-15 LAB — CREATININE, SERUM
Creatinine, Ser: 1.2 mg/dL — ABNORMAL HIGH (ref 0.50–1.10)
GFR calc Af Amer: 60 mL/min — ABNORMAL LOW (ref >90)
GFR calc non Af Amer: 51 mL/min — ABNORMAL LOW (ref >90)

## 2012-10-15 LAB — CBC
HCT: 40.5 % (ref 36.0–46.0)
Hemoglobin: 14.5 g/dL (ref 12.0–15.0)
MCHC: 35.8 g/dL (ref 30.0–36.0)
RBC: 4.73 MIL/uL (ref 3.87–5.11)

## 2012-10-15 LAB — LIPID PANEL: LDL Cholesterol: 168 mg/dL — ABNORMAL HIGH (ref 0–99)

## 2012-10-15 LAB — TSH: TSH: 1.658 u[IU]/mL (ref 0.350–4.500)

## 2012-10-15 MED ORDER — LUBIPROSTONE 24 MCG PO CAPS
24.0000 ug | ORAL_CAPSULE | Freq: Every day | ORAL | Status: DC | PRN
  Filled 2012-10-15: qty 1

## 2012-10-15 MED ORDER — CHLORTHALIDONE 25 MG PO TABS
25.0000 mg | ORAL_TABLET | Freq: Every day | ORAL | Status: DC
  Administered 2012-10-15: 25 mg via ORAL
  Filled 2012-10-15: qty 1

## 2012-10-15 MED ORDER — ACETAMINOPHEN 325 MG PO TABS
650.0000 mg | ORAL_TABLET | ORAL | Status: DC | PRN
  Administered 2012-10-15: 650 mg via ORAL
  Filled 2012-10-15: qty 2

## 2012-10-15 MED ORDER — LEVOTHYROXINE SODIUM 200 MCG PO TABS
200.0000 ug | ORAL_TABLET | Freq: Every day | ORAL | Status: DC
  Administered 2012-10-15: 200 ug via ORAL
  Filled 2012-10-15 (×2): qty 1

## 2012-10-15 MED ORDER — NITROGLYCERIN 0.4 MG SL SUBL: 0.4000 mg | SUBLINGUAL_TABLET | SUBLINGUAL | Status: DC | PRN

## 2012-10-15 MED ORDER — NIFEDIPINE ER OSMOTIC RELEASE 90 MG PO TB24
90.0000 mg | ORAL_TABLET | Freq: Every day | ORAL | Status: DC
  Administered 2012-10-15: 90 mg via ORAL
  Filled 2012-10-15: qty 1

## 2012-10-15 MED ORDER — ASPIRIN EC 81 MG PO TBEC
81.0000 mg | DELAYED_RELEASE_TABLET | Freq: Every day | ORAL | Status: DC
  Filled 2012-10-15: qty 1

## 2012-10-15 MED ORDER — SODIUM CHLORIDE 0.9 % IJ SOLN: 3.0000 mL | INTRAMUSCULAR | Status: DC | PRN

## 2012-10-15 MED ORDER — SODIUM CHLORIDE 0.9 % IV SOLN
INTRAVENOUS | Status: DC
  Administered 2012-10-15: 75 mL via INTRAVENOUS
  Administered 2012-10-15: 03:00:00 via INTRAVENOUS

## 2012-10-15 MED ORDER — ONDANSETRON HCL 4 MG/2ML IJ SOLN
4.0000 mg | Freq: Four times a day (QID) | INTRAMUSCULAR | Status: DC | PRN
  Administered 2012-10-15 (×2): 4 mg via INTRAVENOUS
  Filled 2012-10-15 (×2): qty 2

## 2012-10-15 MED ORDER — SODIUM CHLORIDE 0.9 % IV SOLN: 250.0000 mL | INTRAVENOUS | Status: DC | PRN

## 2012-10-15 MED ORDER — PANTOPRAZOLE SODIUM 40 MG PO TBEC
40.0000 mg | DELAYED_RELEASE_TABLET | Freq: Every day | ORAL | Status: DC
  Administered 2012-10-15: 40 mg via ORAL
  Filled 2012-10-15: qty 1

## 2012-10-15 MED ORDER — GABAPENTIN 300 MG PO CAPS
600.0000 mg | ORAL_CAPSULE | Freq: Two times a day (BID) | ORAL | Status: DC
  Administered 2012-10-15 (×2): 600 mg via ORAL
  Filled 2012-10-15 (×3): qty 2

## 2012-10-15 MED ORDER — MORPHINE SULFATE 2 MG/ML IJ SOLN
1.0000 mg | INTRAMUSCULAR | Status: DC | PRN
  Administered 2012-10-15: 1 mg via INTRAVENOUS
  Filled 2012-10-15: qty 1

## 2012-10-15 MED ORDER — SPIRONOLACTONE 50 MG PO TABS
50.0000 mg | ORAL_TABLET | Freq: Every day | ORAL | Status: DC
  Administered 2012-10-15: 50 mg via ORAL
  Filled 2012-10-15: qty 1

## 2012-10-15 MED ORDER — ONDANSETRON HCL 4 MG/2ML IJ SOLN
4.0000 mg | Freq: Once | INTRAMUSCULAR | Status: AC
  Administered 2012-10-15: 4 mg via INTRAVENOUS
  Filled 2012-10-15: qty 2

## 2012-10-15 MED ORDER — ATORVASTATIN CALCIUM 40 MG PO TABS
40.0000 mg | ORAL_TABLET | Freq: Every day | ORAL | Status: DC
  Administered 2012-10-15: 40 mg via ORAL
  Filled 2012-10-15: qty 1

## 2012-10-15 MED ORDER — LABETALOL HCL 300 MG PO TABS
800.0000 mg | ORAL_TABLET | Freq: Two times a day (BID) | ORAL | Status: DC
  Administered 2012-10-15 (×2): 800 mg via ORAL
  Filled 2012-10-15 (×3): qty 1

## 2012-10-15 MED ORDER — ENOXAPARIN SODIUM 40 MG/0.4ML ~~LOC~~ SOLN
40.0000 mg | SUBCUTANEOUS | Status: DC
  Administered 2012-10-15: 40 mg via SUBCUTANEOUS
  Filled 2012-10-15: qty 0.4

## 2012-10-15 MED ORDER — DULOXETINE HCL 60 MG PO CPEP
60.0000 mg | ORAL_CAPSULE | Freq: Every day | ORAL | Status: DC
  Administered 2012-10-15: 60 mg via ORAL
  Filled 2012-10-15: qty 1

## 2012-10-15 MED ORDER — ASPIRIN EC 81 MG PO TBEC
81.0000 mg | DELAYED_RELEASE_TABLET | Freq: Every day | ORAL | Status: DC
  Administered 2012-10-15: 81 mg via ORAL
  Filled 2012-10-15: qty 1

## 2012-10-15 MED ORDER — LORAZEPAM 1 MG PO TABS: 1.0000 mg | ORAL_TABLET | Freq: Three times a day (TID) | ORAL | Status: DC | PRN

## 2012-10-15 MED ORDER — ZOLPIDEM TARTRATE 5 MG PO TABS: 5.0000 mg | ORAL_TABLET | Freq: Every evening | ORAL | Status: DC | PRN

## 2012-10-15 NOTE — ED Notes (Signed)
Floor called to give report, receiving RN is with another pt and will call back to get report.

## 2012-10-15 NOTE — ED Provider Notes (Signed)
History     CSN: [id number]  Arrival date & time 10/14/12  2228   First MD Initiated Contact with Patient 10/14/12 2313      Chief Complaint  Patient presents with  . Chest Pain     HPI Pt was seen at 2315.   Per pt, c/o gradual onset and persistence of constant left sided chest "pain" that began approx 2 hours PTA.  Pt states she was sitting watching TV when her symptoms began.  Describes the CP as "slow stabbing" pain, and "like the last time they told me I had a heart attack."  Has been associated with SOB, nausea, and diaphoresis.  Pt received ASA, SL ntg, and IV morphine by EMS en route with partial improvement in her symptoms. Pt also states she has had "palpitations all day."  Denies cough, no back pain, no abd pain, no vomiting/diarrhea, no fevers, no syncope/near syncope.     Past Medical History  Diagnosis Date  . Migraine headache   . Chest pain     a. No angiographic CAD by cath 2009 in [place]. There was catheter-induced RCA vasospasm versus microvascular obstruction. bEugenie Birks myoview 04/2010 - EF 79%, normal perfusion.;  c. ETT-Myoview 7/13: no ischemia, EF 77%, submax exercise  . Renal artery stenosis     a. Due to fibromuscular dysplasia. b. Renal stent 2009. c. Studies:  CTA abdomen (1/13) with evidence for fibromuscular dysplasia, right renal artery stent appears patent. Renal artery dopplers (2/13) with FMD but no evidence for signficant stenosis.   . Hypothyroidism   . Fibromuscular dysplasia 2009    a. Renal artery stenosis due to this. b. Carotid dopplers 07/2011 c/w FMD but no significant stenosis  . Tachycardia 2000    10/27/10-Holter -NSR with occ sinus tachy-rare PACs, no sig arrhythmia  . Hx of echocardiogram     a. Echo (1/13): EF 55-60%, grade 1 diastolic dysfunction  . HTN (hypertension) 1988    a. Likely related to RAS; urinary metanephrines and plasma renin/aldosterone ratio normal. b. H/o HTN urgency 06/2011 after being off BP meds for a number  of days  . HLD (hyperlipidemia) 2000  . Myocardial infarction 2009  . Anxiety 2001  . Depression 2001  . Hypertensive crisis 06/29/2011  . Leiomyoma   . Adenomyosis   . CVA (cerebral vascular accident) 2006  . Fibromyalgia 1999  . Coronary vasospasm   . Chronic back pain   . Hearing loss of left ear     s/p CVA 2006    Past Surgical History  Procedure Laterality Date  . Renal artery stent  2009  . Spine surgery    . Back surgery  2010 and 2011    Lumbar back surgery in [place]   . Robotic assisted lap vaginal hysterectomy  05/13/2008    [facility]. da Vinci hysterectomy  for myomatous uterus     Family History  Problem Relation Age of Onset  . Heart attack    . Stroke    . Hypertension Mother   . Asthma Mother   . Hyperlipidemia Mother   . Stroke Mother   . Hypertension Father   . Cancer Father     Prostate   . Heart attack Father   . Hyperlipidemia Father   . Stroke Father   . Hypertension Brother     Died from a massive stroke in setting of severe HTN at 39 (had had MI at 31)  . Heart attack Brother   . Early  death Brother 68    stroke   . Hyperlipidemia Brother   . Metabolic syndrome Sister   . Depression Sister   . Hypertension Sister   . Diabetes Maternal Aunt     History  Substance Use Topics  . Smoking status: Never Smoker   . Smokeless tobacco: Never Used  . Alcohol Use: No     Review of Systems ROS: Statement: All systems negative except as marked or noted in the HPI; Constitutional: Negative for fever and chills. ; ; Eyes: Negative for eye pain, redness and discharge. ; ; ENMT: Negative for ear pain, hoarseness, nasal congestion, sinus pressure and sore throat. ; ; Cardiovascular: +CP, SOB, diaphoresis, palpitatoins. Negative for peripheral edema. ; ; Respiratory: Negative for cough, wheezing and stridor. ; ; Gastrointestinal: +nausea. Negative for vomiting, diarrhea, abdominal pain, blood in stool, hematemesis, jaundice and rectal bleeding. . ; ;  Genitourinary: Negative for dysuria, flank pain and hematuria. ; ; Musculoskeletal: Negative for back pain and neck pain. Negative for swelling and trauma.; ; Skin: Negative for rash, abrasions, blisters, bruising and skin lesion.; ; Neuro: Negative for headache, lightheadedness and neck stiffness. Negative for weakness, altered level of consciousness , altered mental status, extremity weakness, paresthesias, involuntary movement, seizure and syncope.       Allergies  Hydromorphone hcl; Prochlorperazine edisylate; and Sumatriptan  Home Medications   Current Outpatient Rx  Name  Route  Sig  Dispense  Refill  . aspirin EC 81 MG tablet   Oral   Take 81 mg by mouth daily.         Marland Kitchen atorvastatin (LIPITOR) 40 MG tablet   Oral   Take 1 tablet (40 mg total) by mouth daily.   30 tablet   6   . chlorthalidone (HYGROTON) 25 MG tablet   Oral   Take 25 mg by mouth daily.         . DULoxetine (CYMBALTA) 60 MG capsule   Oral   Take 1 capsule (60 mg total) by mouth daily.   90 capsule   3   . ergocalciferol (VITAMIN D2) 50000 UNITS capsule   Oral   Take 1 capsule (50,000 Units total) by mouth once a week. On monday   12 capsule   0   . gabapentin (NEURONTIN) 300 MG capsule   Oral   Take 600 mg by mouth 2 (two) times daily.         Marland Kitchen labetalol (NORMODYNE) 200 MG tablet   Oral   Take 800 mg by mouth 2 (two) times daily.         Marland Kitchen levothyroxine (SYNTHROID, LEVOTHROID) 125 MCG tablet   Oral   Take 1.5 tablets (187.5 mcg total) by mouth daily before breakfast.   45 tablet   6   . LORazepam (ATIVAN) 1 MG tablet   Oral   Take 1 tablet (1 mg total) by mouth every 8 (eight) hours as needed for anxiety.   30 tablet   0   . lubiprostone (AMITIZA) 24 MCG capsule   Oral   Take 1 capsule (24 mcg total) by mouth daily as needed. For irritable bowel syndrome w/ constipation   90 capsule   3   . NIFEdipine (PROCARDIA XL/ADALAT-CC) 90 MG 24 hr tablet   Oral   Take 90 mg by  mouth daily.          . nitroGLYCERIN (NITROSTAT) 0.4 MG SL tablet   Sublingual   Place 0.4 mg under the tongue  every 5 (five) minutes as needed. x3 doses as needed for chest pain         . potassium chloride SA (K-DUR,KLOR-CON) 20 MEQ tablet   Oral   Take 20 mEq by mouth daily.         Marland Kitchen rOPINIRole (REQUIP) 0.25 MG tablet   Oral   Take 0.5 mg by mouth at bedtime. Start one tab daily, increase by one tab every 3-4 days until 2 mg daily.         Marland Kitchen spironolactone (ALDACTONE) 50 MG tablet   Oral   Take 1 tablet (50 mg total) by mouth daily.   30 tablet   3   . zolpidem (AMBIEN) 5 MG tablet   Oral   Take 1-2 tablets (5-10 mg total) by mouth at bedtime as needed. For sleep   30 tablet   2     BP 156/87  Pulse 113  Temp(Src) 97.9 F (36.6 C) (Oral)  Resp 18  SpO2 98%  Physical Exam 2320: Physical examination:  Nursing notes reviewed; Vital signs and O2 SAT reviewed;  Constitutional: Well developed, Well nourished, Well hydrated, In no acute distress; Head:  Normocephalic, atraumatic; Eyes: EOMI, PERRL, No scleral icterus; ENMT: Mouth and pharynx normal, Mucous membranes moist; Neck: Supple, Full range of motion, No lymphadenopathy; Cardiovascular: Regular rate and rhythm, No gallop; Respiratory: Breath sounds clear & equal bilaterally, No rales, rhonchi, wheezes.  Speaking full sentences with ease, Normal respiratory effort/excursion; Chest: Nontender, Movement normal; Abdomen: Soft, Nontender, Nondistended, Normal bowel sounds; Genitourinary: No CVA tenderness; Extremities: Pulses normal, No tenderness, No edema, No calf edema or asymmetry.; Neuro: AA&Ox3, Major CN grossly intact.  Speech clear. No gross focal motor or sensory deficits in extremities.; Skin: Color normal, Warm, Dry.; Psych:  Anxious.    ED Course  Procedures    MDM  MDM Reviewed: previous chart, nursing note and vitals Reviewed previous: ECG and labs Interpretation: ECG, labs and x-ray       Date: 10/15/2012  Rate: 91  Rhythm: normal sinus rhythm  QRS Axis: normal  Intervals: normal  ST/T Wave abnormalities: normal  Conduction Disutrbances:none  Narrative Interpretation:   Old EKG Reviewed: unchanged; no significant changes from previous EKG dated 06/15/2012.  Results for orders placed during the hospital encounter of 10/14/12  CBC      Result Value Range   WBC 6.7  4.0 - 10.5 K/uL   RBC 4.86  3.87 - 5.11 MIL/uL   Hemoglobin 15.0  12.0 - 15.0 g/dL   HCT 40.9  81.1 - 91.4 %   MCV 83.7  78.0 - 100.0 fL   MCH 30.9  26.0 - 34.0 pg   MCHC 36.9 (*) 30.0 - 36.0 g/dL   RDW 78.2  95.6 - 21.3 %   Platelets 310  150 - 400 K/uL  BASIC METABOLIC PANEL      Result Value Range   Sodium 140  135 - 145 mEq/L   Potassium 3.7  3.5 - 5.1 mEq/L   Chloride 103  96 - 112 mEq/L   CO2 28  19 - 32 mEq/L   Glucose, Bld 91  70 - 99 mg/dL   BUN 20  6 - 23 mg/dL   Creatinine, Ser 0.86 (*) 0.50 - 1.10 mg/dL   Calcium 9.2  8.4 - 57.8 mg/dL   GFR calc non Af Amer 49 (*) >90 mL/min   GFR calc Af Amer 57 (*) >90 mL/min  POCT I-STAT TROPONIN I  Result Value Range   Troponin i, poc 0.00  0.00 - 0.08 ng/mL   Comment 3            Dg Chest Portable 1 View 10/14/2012  *RADIOLOGY REPORT*  Clinical Data: Stabbing chest pain and pressure tonight radiating to left shoulder and neck, worse with movement and lying flat, nausea, past history hypertension, coronary artery disease post MRI  PORTABLE CHEST - 1 VIEW  Comparison: Portable exam 2325 hours compared to 03/27/2012  Findings: Normal heart size, mediastinal contours, and pulmonary vascularity. Lungs clear. No pleural effusion or pneumothorax. Bones unremarkable.  IMPRESSION: No acute abnormalities.   Original Report Authenticated By: Ulyses Southward, M.D.     Suzy.Karvonen:  Improving after ASA and ntg.  Pt was itching after EMS gave morphine without hives or wheezing/stridor; benadryl given with improvement.  T/C to [facility] Resident, case discussed, including:  HPI,  pertinent PM/SHx, VS/PE, dx testing, ED course and treatment:  Agreeable to come to ED for eval for observation admit.          Laray Anger, DO 10/16/12 1506

## 2012-10-15 NOTE — Progress Notes (Signed)
Utilization Review Completed.[name] Rowe T5/04/2013  

## 2012-10-15 NOTE — H&P (Signed)
Family Medicine Teaching [facility] Admission History and Physical Service Pager: [phone]  Patient name: [name] Rowe Medical record number: [id number] Date of birth: November 21, 1960 Age: 52 y.o. Gender: female  Primary Care Provider: Dessa Phi, MD  Chief Complaint: Chest pain  Assessment and Plan: [name] Rowe is a 52 y.o. year old female PMH of HTN, HLD, anxiety/depression, Coronary vasospasm (? History of MI), CVA, and hypothyroidism who presents with chest pain.   # Chest pain - Appears atypical in nature as it was not associated with exertion or relieved by nitroglycerin.  Additionally, given recent stress, anxiety likely contributing factor.  However, given history of HTN, HLD, Coronary vasospasm and questionable history of MI, further workup/evaluation is warranted. - Will admit to telemetry, attending Dr. Lum Babe - EKG non ischemic and POC Troponin negative. - Will cycle troponin x 3.  - Risk stratification labs - Lipid panel, A1C, TSH - EKG in the am - Aspirin 81 mg daily, Nitro PRN, & Morphine 1mg  Q4 PRN for severe pain - Continuing home Labetalol and Lipitor  # HTN - Will continue home medications: Aldactone, Procardia, labetalol, and Chlorthalidone  # HLD - Lipid panel to be obtained - Will continue home Lipitor 40 mg  # Anxiety/Depression - Continuing home Cymbalta, and Ativan  # Hypothyroidism - Continuing home synthroid - TSH to be obtained (was normal in 06/2012)  FEN/GI: NS @ 75 mL/hr; Heart healthy diet Prophylaxis: Lovenox, PPI Disposition: pending continued improvement and further workup Code Status: full code  History of Present Illness:  [name] Rowe is a 53 y.o. year old female with PMH of HTN, HLD, anxiety/depression, Coronary vasospasm (? History of MI), CVA, and hypothyroidism who presents with chest pain.  Patient reports that she was sitting at home watching TV (8:30 PM 5/10) and developed slow, stabbing chest pain located to the  left of her sternum.  It radiated to her left jaw/neck, shoulder, and back and was 10/10 in severity. Associated symptoms included SOB, dizziness, nausea/vomiting, palpitations.  She took 1 Nitroglycerin with no relief in her symptoms.  She then took 2 baby aspirin and called 911. They instructed her to take 2 additional Aspirin (for total of 324 mg).  En route patient was give 2 additional Nitro, 10 mg Morphine, and 4 mg of Zofran.  In the ED, patient was given additional nitroglyerin, zofran, and fentanyl with cessation of chest pain.   Currently, patient reports that she feels "pressure" in her chest but no pain. POC Troponin negative and EKG nonischemic in the ED.  *Of note, patient reports that her son is in the ICU in [place] and she has been under a great deal of stress lately.   Patient Active Problem List   Diagnosis Date Noted  . Situational anxiety 09/05/2012  . Nocturnal leg cramps 09/05/2012  . Vitamin D deficiency 06/30/2012  . Unilateral hearing loss 06/30/2012  . Vestibular dizziness 06/30/2012  . Left hip pain 06/16/2012  . Dysphagia 06/09/2012  . Healthcare maintenance 03/31/2012  . Fibromuscular dysplasia 08/13/2011  . HTN (hypertension)   . History of CVA (cerebrovascular accident)   . Chest pain   . Coronary vasospasm   . Palpitations 10/26/2010  . HYPOTHYROIDISM 04/10/2010  . HYPERLIPIDEMIA 04/10/2010  . Anxiety state, unspecified 04/10/2010  . DEPRESSION 04/10/2010  . MIGRAINE HEADACHE 04/10/2010  . RENAL ARTERY STENOSIS 04/10/2010  . FIBROMYALGIA 04/10/2010  . Status post lumbar surgery 04/10/2010  . BACK PAIN 04/09/2010   Past Medical History: Past Medical History  Diagnosis Date  .  Migraine headache   . Chest pain     a. No angiographic CAD by cath 2009 in [place]. There was catheter-induced RCA vasospasm versus microvascular obstruction. bEugenie Birks myoview 04/2010 - EF 79%, normal perfusion.;  c. ETT-Myoview 7/13: no ischemia, EF 77%, submax  exercise  . Renal artery stenosis     a. Due to fibromuscular dysplasia. b. Renal stent 2009. c. Studies:  CTA abdomen (1/13) with evidence for fibromuscular dysplasia, right renal artery stent appears patent. Renal artery dopplers (2/13) with FMD but no evidence for signficant stenosis.   . Hypothyroidism   . Fibromuscular dysplasia 2009    a. Renal artery stenosis due to this. b. Carotid dopplers 07/2011 c/w FMD but no significant stenosis  . Tachycardia 2000    10/27/10-Holter -NSR with occ sinus tachy-rare PACs, no sig arrhythmia  . Hx of echocardiogram     a. Echo (1/13): EF 55-60%, grade 1 diastolic dysfunction  . HTN (hypertension) 1988    a. Likely related to RAS; urinary metanephrines and plasma renin/aldosterone ratio normal. b. H/o HTN urgency 06/2011 after being off BP meds for a number of days  . HLD (hyperlipidemia) 2000  . Myocardial infarction 2009  . Anxiety 2001  . Depression 2001  . Hypertensive crisis 06/29/2011  . Leiomyoma   . Adenomyosis   . CVA (cerebral vascular accident) 2006  . Fibromyalgia 1999  . Coronary vasospasm   . Chronic back pain   . Hearing loss of left ear     s/p CVA 2006   Past Surgical History: Past Surgical History  Procedure Laterality Date  . Renal artery stent  2009  . Spine surgery    . Back surgery  2010 and 2011    Lumbar back surgery in [place]   . Robotic assisted lap vaginal hysterectomy  05/13/2008    [facility]. da Vinci hysterectomy  for myomatous uterus    Social History: History  Substance Use Topics  . Smoking status: Never Smoker   . Smokeless tobacco: Never Used  . Alcohol Use: No    Family History: Family History  Problem Relation Age of Onset  . Heart attack    . Stroke    . Hypertension Mother   . Asthma Mother   . Hyperlipidemia Mother   . Stroke Mother   . Hypertension Father   . Cancer Father     Prostate   . Heart attack Father   . Hyperlipidemia Father   . Stroke Father   . Hypertension Brother      Died from a massive stroke in setting of severe HTN at 74 (had had MI at 61)  . Heart attack Brother   . Early death Brother 6    stroke   . Hyperlipidemia Brother   . Metabolic syndrome Sister   . Depression Sister   . Hypertension Sister   . Diabetes Maternal Aunt    Allergies: Allergies  Allergen Reactions  . Hydromorphone Hcl Other (See Comments)    Palpitations  . Prochlorperazine Edisylate Other (See Comments)    Seizure   . Sumatriptan Palpitations   No current facility-administered medications on file prior to encounter.   Current Outpatient Prescriptions on File Prior to Encounter  Medication Sig Dispense Refill  . aspirin EC 81 MG tablet Take 81 mg by mouth daily.      Marland Kitchen atorvastatin (LIPITOR) 40 MG tablet Take 1 tablet (40 mg total) by mouth daily.  30 tablet  6  . chlorthalidone (HYGROTON)  25 MG tablet Take 25 mg by mouth daily.      . DULoxetine (CYMBALTA) 60 MG capsule Take 1 capsule (60 mg total) by mouth daily.  90 capsule  3  . ergocalciferol (VITAMIN D2) 50000 UNITS capsule Take 1 capsule (50,000 Units total) by mouth once a week. On monday  12 capsule  0  . labetalol (NORMODYNE) 200 MG tablet Take 800 mg by mouth 2 (two) times daily.      Marland Kitchen levothyroxine (SYNTHROID, LEVOTHROID) 125 MCG tablet Take 1.5 tablets (187.5 mcg total) by mouth daily before breakfast.  45 tablet  6  . LORazepam (ATIVAN) 1 MG tablet Take 1 tablet (1 mg total) by mouth every 8 (eight) hours as needed for anxiety.  30 tablet  0  . lubiprostone (AMITIZA) 24 MCG capsule Take 1 capsule (24 mcg total) by mouth daily as needed. For irritable bowel syndrome w/ constipation  90 capsule  3  . NIFEdipine (PROCARDIA XL/ADALAT-CC) 90 MG 24 hr tablet Take 90 mg by mouth daily.       . nitroGLYCERIN (NITROSTAT) 0.4 MG SL tablet Place 0.4 mg under the tongue every 5 (five) minutes as needed. x3 doses as needed for chest pain      . potassium chloride SA (K-DUR,KLOR-CON) 20 MEQ tablet Take 20 mEq by mouth  daily.      Marland Kitchen spironolactone (ALDACTONE) 50 MG tablet Take 1 tablet (50 mg total) by mouth daily.  30 tablet  3  . zolpidem (AMBIEN) 5 MG tablet Take 1-2 tablets (5-10 mg total) by mouth at bedtime as needed. For sleep  30 tablet  2   Review Of Systems: Per HPI. Otherwise 12 point review of systems was performed and was unremarkable.  Physical Exam: BP 156/87  Pulse 113  Temp(Src) 97.9 F (36.6 C) (Oral)  Resp 18  SpO2 98% Exam: General: well appearing female, sitting up in bed. NAD.  Pain free currently.  Talkative & pleasant. HEENT: NCAT. PERRLA. No pharyngeal erythema or exudated. Cardiovascular: Tachycardic. Regular rhythm. No murmurs auscultated. Respiratory: CTAB. No rales, rhonchi, or wheezing. Abdomen: soft, nontender, nondistended. No palpable organomegaly.  Extremities: warm, well perfused without edema. Skin: warm, dry, intact. No rash.  Long midline scar noted on patient's lower back from previous back surgeries. Neuro:  No focal deficits.  Labs and Imaging: CBC BMET   Recent Labs Lab 10/14/12 2303  WBC 6.7  HGB 15.0  HCT 40.7  PLT 310    Recent Labs Lab 10/14/12 2303  NA 140  K 3.7  CL 103  CO2 28  BUN 20  CREATININE 1.24*  GLUCOSE 91  CALCIUM 9.2     POC Troponin - 0.00  EKG - NSR. No ST or T wave changes suggestive of ischemia.  Dg Chest Portable 1 View 10/14/2012 IMPRESSION: No acute abnormalities.  Everlene Other, DO 10/15/2012, 12:21 AM   UPPER LEVEL ADDENDUM  I have seen and examined Keari Guevara with Dr. [name] Simas and I agree with the above assessment/plan. I have reviewed all available data and have made any necessary changes to the above H&P.  Tyronza Happe 10/15/2012, 6:33 AM

## 2012-10-15 NOTE — ED Notes (Signed)
Pt vomited.   

## 2012-10-15 NOTE — H&P (Signed)
[facility] Attending Admission Note: [name] Theiss,MD I  have seen and examined this patient, reviewed their chart. I have discussed this patient with the resident. I agree with the resident's findings, assessment and care plan.  Patient was brough in with hx of chest pain that started yesterday morning,pain is on left side radiating to the left shoulder and her neck,feels like a stabbing pain.This was associated with SOB and palpitations,she also felt dizzy no fall or LOC,she took Nitroglycerin at home with minimal improvement and called 911. This morning she feels better but still some dizziness,she stated she had chest pain very early today but relieved with Morphine.  O/E: Comfortable in bed,not in distress,appears sleepy. RESP/CV: wnl , Abd: Benign,  Ext: No edema.  A/P: 1. Chest pain: R/O ACS.     EKG showed NSR with no ischemic changes.     TNI neg X 2.     ASA, Statin,NItroglycerin prn.     WIll benefit from stress test. 2. HTN: Restart home med and adjust appropriately.

## 2012-10-16 ENCOUNTER — Encounter: Payer: Self-pay | Admitting: Family Medicine

## 2012-10-16 ENCOUNTER — Ambulatory Visit (INDEPENDENT_AMBULATORY_CARE_PROVIDER_SITE_OTHER): Payer: Medicaid Other | Admitting: Family Medicine

## 2012-10-16 VITALS — BP 90/64 | HR 90 | Temp 98.0°F | Ht 63.0 in | Wt 135.0 lb

## 2012-10-16 DIAGNOSIS — IMO0001 Reserved for inherently not codable concepts without codable children: Secondary | ICD-10-CM

## 2012-10-16 MED ORDER — ONDANSETRON 8 MG PO TBDP: 8.0000 mg | ORAL_TABLET | Freq: Three times a day (TID) | ORAL | Status: DC | PRN

## 2012-10-16 NOTE — Assessment & Plan Note (Signed)
A: suspect  Generalized pain related to fibromyalgia. Reviewed labs, wnl. BP low ? Due to poor intake of water.  P: Hold BP meds Restart thiazide and spironolactone tomorrow.  Zofran for nausea Recheck BP in 2 days  If normal, restart nifedipine and labetalol.

## 2012-10-16 NOTE — Progress Notes (Signed)
Subjective:     Patient ID: [name] Rowe, female   DOB: Dec 18, 1960, 52 y.o.   MRN: [id number]  HPI 52 yo F presents for f/u:  1. Hospital f/u for chest pain: improved. Admitted overnight for rule out. Trop negative. EKG, non ischemic.  2. Generalized malaise and fatigue: started yesterday. Associated with nausea. No fever, vomiting, diarrhea, SOB. Last ate and drank yesterday. Started cymbalta two weeks ago. Experiencing some LE jerking but otherwise tolerating the medication well.   Review of Systems As per HPI     Objective:   Physical Exam BP 90/64  Pulse 90  Temp(Src) 98 F (36.7 C) (Oral)  Ht 5\' 3"  (1.6 m)  Wt 135 lb (61.236 kg)  BMI 23.92 kg/m2 General appearance: alert, cooperative and no distress Lungs: clear to auscultation bilaterally Heart: regular rate and rhythm, S1, S2 normal, no murmur, click, rub or gallop, 2+ radial and DP pulses.  Abdomen: soft, non-tender; bowel sounds normal; no masses,  no organomegaly Extremities: extremities normal, atraumatic, no cyanosis or edema    Assessment and Plan:

## 2012-10-16 NOTE — Patient Instructions (Addendum)
Shaniqua,  Please take zofran for nausea and drink plenty of fluids.  Restart chlothalidone and spironolactone tomorrow. Come back for BP check on Wednesday.  If BP back to baseline, restart nifedipine and labetalol.  Dr. Armen Pickup

## 2012-10-18 ENCOUNTER — Ambulatory Visit: Payer: Medicaid Other | Attending: Physical Medicine and Rehabilitation | Admitting: Physical Therapy

## 2012-10-18 DIAGNOSIS — M545 Low back pain, unspecified: Secondary | ICD-10-CM | POA: Insufficient documentation

## 2012-10-18 DIAGNOSIS — R5381 Other malaise: Secondary | ICD-10-CM | POA: Insufficient documentation

## 2012-10-18 DIAGNOSIS — IMO0001 Reserved for inherently not codable concepts without codable children: Secondary | ICD-10-CM | POA: Insufficient documentation

## 2012-10-19 ENCOUNTER — Telehealth: Payer: Self-pay | Admitting: *Deleted

## 2012-10-19 ENCOUNTER — Telehealth: Payer: Self-pay | Admitting: Family Medicine

## 2012-10-19 DIAGNOSIS — I10 Essential (primary) hypertension: Secondary | ICD-10-CM

## 2012-10-19 MED ORDER — LABETALOL HCL 200 MG PO TABS: 800.0000 mg | ORAL_TABLET | Freq: Two times a day (BID) | ORAL | Status: DC

## 2012-10-19 MED ORDER — LABETALOL HCL 200 MG PO TABS: 1000.0000 mg | ORAL_TABLET | Freq: Two times a day (BID) | ORAL | Status: DC

## 2012-10-19 NOTE — Telephone Encounter (Signed)
Call returned from [facility] for Labetalol  Clarification - take 4 tablets ( 800 mg ) total twice daily as written on 10/19/12. Wyatt Haste, RN-BSN

## 2012-10-19 NOTE — Telephone Encounter (Signed)
Called patient.  Back pain since yesterday and dysuria.. Pain is on L flank.  Pain is associated with urgency and hesitancy. Advised patient call for same day visit to evaluate. I am concerned about pyelonephritis vs renal stone.

## 2012-10-25 ENCOUNTER — Ambulatory Visit: Payer: Medicaid Other | Admitting: Physical Therapy

## 2012-10-27 NOTE — Discharge Summary (Signed)
Family Medicine Teaching [facility] Discharge Summary  Patient name: [name] Rowe Medical record number: [id number] Date of birth: 1961-04-08 Age: 52 y.o. Gender: female Date of Admission: 10/14/2012  Date of Discharge: 10/15/12 Admitting Physician: Janit Pagan, MD  Primary Care Provider: Dessa Phi, MD  Indication for Hospitalization: Chest pain  Discharge Diagnoses:  Chest pain, atypical HTN HLD Anxiety/Depression Hypothyroidism  Brief Hospital Course:  [name] Rowe is a 52 y.o. 52 year old female PMH of HTN, HLD, anxiety/depression, Coronary vasospasm (? History of MI), CVA, and hypothyroidism who presented with chest pain.   1) Chest pain, atypical - EKG non-ischemic - Patient admitted give risk factors. - Patient was treated with Nitro, ASA, Morphine - Troponin's were negative x 3 - Patient discharged home with close outpatient follow up   2) HTN - Continued home Aldactone, Procardia, labetalol, and Chlorthalidone  3) HLD - Patient continued on home lipitor  4) Anxiety/Depression - Continued home home Cymbalta, and Ativan  5) Hypothyroidism - Continued home synthroid Significant Labs and Imaging:  CBC    Component Value Date/Time   WBC 6.4 10/15/2012 0530   RBC 4.73 10/15/2012 0530   HGB 14.5 10/15/2012 0530   HCT 40.5 10/15/2012 0530   PLT 293 10/15/2012 0530   MCV 85.6 10/15/2012 0530   MCH 30.7 10/15/2012 0530   MCHC 35.8 10/15/2012 0530   RDW 11.9 10/15/2012 0530   LYMPHSABS 1.9 06/23/2012 1858   MONOABS 0.5 06/23/2012 1858   EOSABS 0.3 06/23/2012 1858   BASOSABS 0.0 06/23/2012 1858   BMET    Component Value Date/Time   NA 139 10/15/2012 0530   K 3.6 10/15/2012 0530   CL 101 10/15/2012 0530   CO2 29 10/15/2012 0530   GLUCOSE 89 10/15/2012 0530   BUN 19 10/15/2012 0530   CREATININE 1.20* 10/15/2012 0530   CREATININE 1.16* 10/15/2012 0530   CREATININE 1.07 09/05/2012 1611   CALCIUM 9.3 10/15/2012 0530   GFRNONAA 51* 10/15/2012 0530   GFRNONAA 54*  10/15/2012 0530   GFRAA 60* 10/15/2012 0530   GFRAA 62* 10/15/2012 0530    Lab Results  Component Value Date   TSH 1.658 10/15/2012   Troponin - Negative x 3   Lipid Panel     Component Value Date/Time   CHOL 239* 10/15/2012 0530   TRIG 101 10/15/2012 0530   HDL 51 10/15/2012 0530   CHOLHDL 4.7 10/15/2012 0530   VLDL 20 10/15/2012 0530   LDLCALC 168* 10/15/2012 0530   Procedures: None  Consultations: None  Discharge Medications:    Medication List    TAKE these medications       aspirin EC 81 MG tablet  Take 81 mg by mouth daily.     atorvastatin 40 MG tablet  Commonly known as:  LIPITOR  Take 1 tablet (40 mg total) by mouth daily.     chlorthalidone 25 MG tablet  Commonly known as:  HYGROTON  Take 25 mg by mouth daily.     DULoxetine 60 MG capsule  Commonly known as:  CYMBALTA  Take 1 capsule (60 mg total) by mouth daily.     ergocalciferol 50000 UNITS capsule  Commonly known as:  VITAMIN D2  Take 1 capsule (50,000 Units total) by mouth once a week. On monday     gabapentin 300 MG capsule  Commonly known as:  NEURONTIN  Take 600 mg by mouth 2 (two) times daily.     levothyroxine 125 MCG tablet  Commonly known as:  SYNTHROID, LEVOTHROID  Take 1.5 tablets (187.5 mcg total) by mouth daily before breakfast.     LORazepam 1 MG tablet  Commonly known as:  ATIVAN  Take 1 tablet (1 mg total) by mouth every 8 (eight) hours as needed for anxiety.     lubiprostone 24 MCG capsule  Commonly known as:  AMITIZA  Take 1 capsule (24 mcg total) by mouth daily as needed. For irritable bowel syndrome w/ constipation     NIFEdipine 90 MG 24 hr tablet  Commonly known as:  PROCARDIA XL/ADALAT-CC  Take 90 mg by mouth daily.     nitroGLYCERIN 0.4 MG SL tablet  Commonly known as:  NITROSTAT  Place 0.4 mg under the tongue every 5 (five) minutes as needed. x3 doses as needed for chest pain     potassium chloride SA 20 MEQ tablet  Commonly known as:  K-DUR,KLOR-CON  Take 20 mEq  by mouth daily.     rOPINIRole 0.25 MG tablet  Commonly known as:  REQUIP  Take 0.5 mg by mouth at bedtime. Start one tab daily, increase by one tab every 3-4 days until 2 mg daily.     spironolactone 50 MG tablet  Commonly known as:  ALDACTONE  Take 1 tablet (50 mg total) by mouth daily.     zolpidem 5 MG tablet  Commonly known as:  AMBIEN  Take 1-2 tablets (5-10 mg total) by mouth at bedtime as needed. For sleep       Issues for Follow Up:  1) Follow up HTN 2) Resolution of chest pain  Outstanding Results: None  Discharge Instructions:  Patient was counseled important signs and symptoms that should prompt return to medical care, changes in medications, dietary instructions, activity restrictions, and follow up appointments.    Discharge Condition: Stable. Discharged home.  Everlene Other, DO 10/27/2012, 9:55 PM

## 2012-11-01 ENCOUNTER — Encounter: Payer: Self-pay | Admitting: Physical Medicine and Rehabilitation

## 2012-11-01 ENCOUNTER — Encounter
Payer: Medicaid Other | Attending: Physical Medicine and Rehabilitation | Admitting: Physical Medicine and Rehabilitation

## 2012-11-01 VITALS — BP 153/99 | HR 109 | Resp 14 | Wt 136.2 lb

## 2012-11-01 DIAGNOSIS — M549 Dorsalgia, unspecified: Secondary | ICD-10-CM

## 2012-11-01 DIAGNOSIS — M76899 Other specified enthesopathies of unspecified lower limb, excluding foot: Secondary | ICD-10-CM

## 2012-11-01 DIAGNOSIS — M7062 Trochanteric bursitis, left hip: Secondary | ICD-10-CM

## 2012-11-01 DIAGNOSIS — Z9889 Other specified postprocedural states: Secondary | ICD-10-CM

## 2012-11-01 MED ORDER — GABAPENTIN 300 MG PO CAPS: ORAL_CAPSULE | ORAL | Status: DC

## 2012-11-01 NOTE — Patient Instructions (Addendum)
I have ordered a lumbar support for you to use while you are up doing household activities.  I have asked your physical therapist to try a TENS unit with you.  I have changed your gabapentin to 300 mg at 8 AM, 1 p.m. 6 p.m. and 600 mg at 11 PM  I would like you to take an extra strength Tylenol each time you take your gabapentin.  I understand you are trying to avoid narcotics.  You have some tenderness over your left hip in may benefit from an injection to this area. Please discuss this further at your next visit at our clinic.  You may consider some of the other injections we have discussed to help you with your back pain. You have been given a brochure any previous visit.  Followup in one month

## 2012-11-01 NOTE — Progress Notes (Signed)
Subjective:    Patient ID: [name] Rowe, female    DOB: 04-21-1961, 52 y.o.   MRN: [id number]  HPI    Pain Inventory Average Pain 9 Pain Right Now 9 My pain is sharp, dull and aching  In the last 24 hours, has pain interfered with the following? General activity 7 RelatiThe patient is a 52 year old woman who was seen by me previously back in October of 2012. At that time she had chronic back and left leg pain. Over the last couple of years she has had some difficulty with insurance. She has not been able to get continuous medical care. Her chief complaint today is low back pain which radiates from the lumbosacral junction to the posterior lateral buttock region bilaterally. She also reports knee pain which is not her main complaint.  Her average pain score is a 9 to a 10 on a scale of 10. She reports the pain is sharp dull pinching and aching in quality and constant. She believes be increased pain began in February this year. She does however have a long history of chronic low back pain which dates back to sometime in 2006 or possibly 2007 when she was working. She reports her waist feel swollen and her spine feels like it is going inside of her hips. Pain is exacerbated with sitting standing and laying down. She reports difficulty sleeping she reports that sometimes she can't move her legs. When she is laying down she states her legs feel heavy. She has been to the emergency room several times for this.  She states that she is reluctant to take narcotic pain medication because she does not like how she feels. She has not had physical therapy she does use heat on her back occasionally she has not had any spine injections except before her surgery. Her for surgery was 11/19/2008 (possibly a laminectomy) and in May of 2011 she underwent a L2-L3 fusion.  Pt also reports being very stressed due to husband arrested in November 2013, Son(23) recently in ICU.   Recent hospitalization for chest pain  reviewed. Recently started on cymbalta.   on with others 5 Enjoyment of life 10 What TIME of day is your pain at its worst? all Sleep (in general) Poor  Pain is worse with: walking, bending, sitting, standing and some activites Pain improves with: rest Relief from Meds: 0  Mobility walk without assistance how many minutes can you walk? 10-15 ability to climb steps?  yes do you drive?  yes  Function not employed: date last employed . I need assistance with the following:  shopping  Neuro/Psych dizziness depression anxiety  Prior Studies Any changes since last visit?  no  Physicians involved in your care Any changes since last visit?  no   Family History  Problem Relation Age of Onset  . Heart attack    . Stroke    . Hypertension Mother   . Asthma Mother   . Hyperlipidemia Mother   . Stroke Mother   . Hypertension Father   . Cancer Father     Prostate   . Heart attack Father   . Hyperlipidemia Father   . Stroke Father   . Hypertension Brother     Died from a massive stroke in setting of severe HTN at 35 (had had MI at 3)  . Heart attack Brother   . Early death Brother 68    stroke   . Hyperlipidemia Brother   . Metabolic syndrome Sister   .  Depression Sister   . Hypertension Sister   . Diabetes Maternal Aunt    History   Social History  . Marital Status: Married    Spouse Name: N/A    Number of Children: 2  . Years of Education: N/A   Occupational History  . worked in a Museum/gallery conservator.     Quit about a yr ago because fo work related injury to left knee   Social History Main Topics  . Smoking status: Never Smoker   . Smokeless tobacco: Never Used  . Alcohol Use: No  . Drug Use: No  . Sexually Active: Not Currently   Other Topics Concern  . None   Social History Narrative   Husband is currently in jail for an offense committed in [place] and he is awaiting extradition.   Lives alone.    Jehovah's witness.    Walks 20-30  minutes.             Past Surgical History  Procedure Laterality Date  . Renal artery stent  2009  . Spine surgery    . Back surgery  2010 and 2011    Lumbar back surgery in [place]   . Robotic assisted lap vaginal hysterectomy  05/13/2008    [facility]. da Vinci hysterectomy  for myomatous uterus    Past Medical History  Diagnosis Date  . Migraine headache   . Chest pain     a. No angiographic CAD by cath 2009 in [place]. There was catheter-induced RCA vasospasm versus microvascular obstruction. bEugenie Birks myoview 04/2010 - EF 79%, normal perfusion.;  c. ETT-Myoview 7/13: no ischemia, EF 77%, submax exercise  . Renal artery stenosis     a. Due to fibromuscular dysplasia. b. Renal stent 2009. c. Studies:  CTA abdomen (1/13) with evidence for fibromuscular dysplasia, right renal artery stent appears patent. Renal artery dopplers (2/13) with FMD but no evidence for signficant stenosis.   . Hypothyroidism   . Fibromuscular dysplasia 2009    a. Renal artery stenosis due to this. b. Carotid dopplers 07/2011 c/w FMD but no significant stenosis  . Tachycardia 2000    10/27/10-Holter -NSR with occ sinus tachy-rare PACs, no sig arrhythmia  . Hx of echocardiogram     a. Echo (1/13): EF 55-60%, grade 1 diastolic dysfunction  . HTN (hypertension) 1988    a. Likely related to RAS; urinary metanephrines and plasma renin/aldosterone ratio normal. b. H/o HTN urgency 06/2011 after being off BP meds for a number of days  . HLD (hyperlipidemia) 2000  . Myocardial infarction 2009  . Anxiety 2001  . Depression 2001  . Hypertensive crisis 06/29/2011  . Leiomyoma   . Adenomyosis   . CVA (cerebral vascular accident) 2006  . Fibromyalgia 1999  . Coronary vasospasm   . Chronic back pain   . Hearing loss of left ear     s/p CVA 2006   BP 153/99  Pulse 109  Resp 14  Wt 136 lb 3.2 oz (61.78 kg)  BMI 24.13 kg/m2  SpO2 100%    Review of Systems  Constitutional: Positive for diaphoresis.   Respiratory: Positive for shortness of breath.   Gastrointestinal: Positive for constipation.  Musculoskeletal: Positive for back pain.  Neurological: Positive for dizziness.  Psychiatric/Behavioral: Positive for dysphoric mood. The patient is nervous/anxious.   All other systems reviewed and are negative.       Objective:   Physical Exam  She is a well-developed, well-nourished woman who does not appear  in any  distress but did get tearful when talking about life stressors noted above. She is oriented x3. Her speech is clear. Affect is overall  bright, alert. She is cooperative and pleasant. Follows commands  without difficulty. Answers my questions appropriately. Cranial nerves  and coordination are intact.  Her reflexes are 2+in both upper and  lower extremities. Hoffman sign is negative bilaterally in the upper  extremities.Her motor strength is overall quite good( 5/5 ) in both upper and lower extremities.  She transitions from sitting to standing slowly. Her gait is non antalgic. Tandem gait and  Romberg test are performed adequately.  She has very limited range of motion in her back, very little forward flexion or extension. She  has very limited side bending as well. Internal and external rotation at the hips bilaterally also  aggravates her does not provoke hip pain.  She reports intact sensation in the upper  extremities to pinprick and light touch. Intact sensationin the lower extremities,  with pinprick,proprioception and vibratory sensation are intact.  She has a verticle scar over both lateral hips and lumbar spine.        Assessment & Plan:  1. Status post lumbar fusion L2-3 with chronic low back pain.  Plan:  Physical therapy to address pain management techniques  Physical therapy to trial a TENS unit  Consider lumbar support for use during household tasks  Gabapentin 600 twice a day changed to gabapentin 300 mg at 8 AM one p.m. 6 PM and 600 mg at 11  PM  Tylenol 500 mg 4 times a day  Patient states desire to avoid opioid narcotic pain medication  Consider left trochanteric bursitis injection next visit  Discussion regarding medial branch blocks/epidural steroid injections again today brochures given at previous visit   Consider lumbar flexion extension films.    Followup one month

## 2012-11-01 NOTE — Discharge Summary (Signed)
[facility] Attending Admission Note: Beya Tipps,MD I  have seen and examined this patient, reviewed their chart. I have discussed this patient with the resident. I agree with the resident's findings, assessment and care plan.  

## 2012-11-02 ENCOUNTER — Ambulatory Visit (INDEPENDENT_AMBULATORY_CARE_PROVIDER_SITE_OTHER): Payer: Medicaid Other | Admitting: Cardiology

## 2012-11-02 ENCOUNTER — Encounter: Payer: Self-pay | Admitting: Cardiology

## 2012-11-02 VITALS — BP 124/82 | HR 89 | Ht 63.0 in | Wt 136.0 lb

## 2012-11-02 DIAGNOSIS — I10 Essential (primary) hypertension: Secondary | ICD-10-CM

## 2012-11-02 DIAGNOSIS — E785 Hyperlipidemia, unspecified: Secondary | ICD-10-CM

## 2012-11-02 DIAGNOSIS — I701 Atherosclerosis of renal artery: Secondary | ICD-10-CM

## 2012-11-02 DIAGNOSIS — R079 Chest pain, unspecified: Secondary | ICD-10-CM

## 2012-11-02 MED ORDER — ATORVASTATIN CALCIUM 80 MG PO TABS: 80.0000 mg | ORAL_TABLET | Freq: Every day | ORAL | Status: DC

## 2012-11-02 MED ORDER — RANOLAZINE ER 500 MG PO TB12: 500.0000 mg | ORAL_TABLET | Freq: Two times a day (BID) | ORAL | Status: DC

## 2012-11-02 NOTE — Patient Instructions (Addendum)
Increase atorvastatin to 80mg  daily.   Start ranexa 500mg  every 12 hours.   Your physician recommends that you schedule an appointment for an EKG in 2 weeks. This should be done 2 weeks after you start Ranexa.  Your physician recommends that you schedule a follow-up appointment in: 1 month with [facility].  Your physician recommends that you return for a FASTING lipid profile /liver profile in 2 months.    Your physician wants you to follow-up in: 6 months with Dr Shirlee Latch. (November 2014).You will receive a reminder letter in the mail two months in advance. If you don't receive a letter, please call our office to schedule the follow-up appointment.

## 2012-11-02 NOTE — Progress Notes (Signed)
Patient ID: [name] Rowe, female   DOB: [birth year], 52 y.o.   MRN: [id number] PCP: Dr. Armen Pickup  52 yo with history of fibromuscular dysplasia of the renal arteries, HTN, and possible coronary vasospasm versus microvascular angina presents for followup.  Patient was admitted to the hospital in [place] in 2009 with severe chest pain.  At that time, she had a left heart cath showing no angiographic coronary disease but the catheter did induce RCA spasm, leading to concern that her chest pain might be coronary vasospasm.  She also had HTN found to be probably related to fibromuscular dysplasia of the renal arteries.  She had a stent placed in the right renal artery in 2009.    She was admitted twice in 1/13, the first time with a hypertensive urgency (she had been off her BP meds for a number of days).  She was re-admitted several days later with orthostatic symptoms and probable rebound tachycardia (she had not picked up her beta blocker prescription after discharge).  She was restarted on beta blocker and HCTZ was stopped.  Carotids in 2/13 showed no significant stenosis and renal artery dopplers in 2/13 showed no significant stenosis.    She was admitted again in 7/13 with atypical chest pain.  She had an ETT-myoview.  This was a submaximal study but showed no ischemia or infarction, EF 77%.  She was in the ER with chest pain in 10/13 but was sent home after cardiac enzymes and ECG were unremarkable. She was admitted again in 5/14 with atypical chest pain, ruled out for MI, and sent home.   She continues to have atypical chest pain episodes (nonexertional) once or twice a week, sometimes severe.  They are usually responsive to NTG.  She has not tolerated Imdur due to headache.  LDL was quite high when last checked.  She says that she is taking atorvastatin 40 mg daily on most days.     Labs (10/11): LDL 136, HDL 44, TGs 159, creatinine 1.06, K 3.8 Labs (11/11): LDL 101, HDL 49, LFTs normal, K 4.8,  creatinine 0.83 Labs (1/13): K 4.2, creatinine 1.16, urine metanephrines normal, PRA/aldosterone ratio normal, LDL 156, HDL 56 Labs (7/13): K 3.8, creatinine 0.96, TSH normal, LDL 98, HDL 60 Labs (9/13): LDL 166, TSH normal, K 3.7, creatinine 1.0 Labs (5/14): K 3.6, creatinine 1.16, LDL 168, HDL 51  ECG: NSR, normal  Allergies (verified):  1)  ! Compazine 2)  ! Imitrex  Past Medical History: 1. Migraine headaches 2. Chest pain: No angiographic coronary disease on cath in 2009 in [place].  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  Suspect coronary vasospasm versus microvascular obstruction.  Lexiscan myoview (11/11): EF 79%, normal perfusion.  ETT-myoview (9/13) with EF 77%, no ischemic or infarction but submaximal study.  Headaches with Imdur.  3. HTN: Renal artery stenosis has played a role. Urinary metanephrines and plasma renin/aldosterone ratio normal.  4. 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  She had a renal artery stent in 2009.  Renal artery dopplers (12/11) suggestive of fibromuscular dysplasia but no significant stenosis. CTA abdomen (1/13) with evidence for fibromuscular dysplasia, right renal artery stent appears patent.  Renal artery dopplers (2/13) with FMD but no evidence for signficant stenosis.  5. Hyperlipidemia 6. Fibromyalgia 7. Echo (1/13) EF 55-60%, grade I diastolic dysfunction. 8. Hypothyroidism 9. Carotid dopplers (2/13): consistent with FMD but no significant stenosis.  10. Fibromuscular dysplasia.     Family History: Mother with MI at 27, Father with  MI at 31, brother with MI at 11 and died with stroke at 34  Social History: Moved to [place] from [place] with her husband.  Denies smoking cigarettes or using illegal drugs Alcohol Use - no Drug Use - no  ROS: All systems reviewed and negative except as per HPI.    Current Outpatient Prescriptions  Medication Sig Dispense Refill  . aspirin EC 81 MG tablet Take 81 mg by mouth  daily.      . chlorthalidone (HYGROTON) 25 MG tablet Take 25 mg by mouth daily.      . DULoxetine (CYMBALTA) 60 MG capsule Take 1 capsule (60 mg total) by mouth daily.  90 capsule  3  . ergocalciferol (VITAMIN D2) 50000 UNITS capsule Take 1 capsule (50,000 Units total) by mouth once a week. On monday  12 capsule  0  . gabapentin (NEURONTIN) 300 MG capsule Take one tab at 8AM, 1PM, 6PM and two tabs at 11PM  150 capsule  1  . labetalol (NORMODYNE) 200 MG tablet Take 4 tablets (800 mg total) by mouth 2 (two) times daily.  240 tablet  3  . levothyroxine (SYNTHROID, LEVOTHROID) 125 MCG tablet Take 1.5 tablets (187.5 mcg total) by mouth daily before breakfast.  45 tablet  6  . LORazepam (ATIVAN) 1 MG tablet Take 1 tablet (1 mg total) by mouth every 8 (eight) hours as needed for anxiety.  30 tablet  0  . lubiprostone (AMITIZA) 24 MCG capsule Take 1 capsule (24 mcg total) by mouth daily as needed. For irritable bowel syndrome w/ constipation  90 capsule  3  . NIFEdipine (PROCARDIA XL/ADALAT-CC) 90 MG 24 hr tablet Take 90 mg by mouth daily.       . nitroGLYCERIN (NITROSTAT) 0.4 MG SL tablet Place 0.4 mg under the tongue every 5 (five) minutes as needed. x3 doses as needed for chest pain      . ondansetron (ZOFRAN ODT) 8 MG disintegrating tablet Take 1 tablet (8 mg total) by mouth every 8 (eight) hours as needed for nausea.  20 tablet  0  . potassium chloride SA (K-DUR,KLOR-CON) 20 MEQ tablet Take 20 mEq by mouth daily.      Marland Kitchen rOPINIRole (REQUIP) 0.25 MG tablet Take 0.5 mg by mouth at bedtime. Start one tab daily, increase by one tab every 3-4 days until 2 mg daily.      Marland Kitchen spironolactone (ALDACTONE) 50 MG tablet Take 1 tablet (50 mg total) by mouth daily.  30 tablet  3  . zolpidem (AMBIEN) 5 MG tablet Take 1-2 tablets (5-10 mg total) by mouth at bedtime as needed. For sleep  30 tablet  2  . atorvastatin (LIPITOR) 80 MG tablet Take 1 tablet (80 mg total) by mouth daily.  30 tablet  3  . ranolazine (RANEXA) 500  MG 12 hr tablet Take 1 tablet (500 mg total) by mouth 2 (two) times daily.  60 tablet  6   No current facility-administered medications for this visit.    BP 124/82  Pulse 89  Ht 5\' 3"  (1.6 m)  Wt 136 lb (61.689 kg)  BMI 24.1 kg/m2 General:  Well developed, well nourished, in no acute distress. Neck:  Neck supple, no JVD. No masses, thyromegaly or abnormal cervical nodes. Lungs:  Clear bilaterally to auscultation and percussion. Heart:  Non-displaced PMI, chest non-tender; regular rate and rhythm, S1, S2 without murmurs, rubs. +S4. Carotid upstroke normal, no bruit.  Pedals normal pulses. No edema, no varicosities. Abdomen:  Bowel sounds  positive; abdomen soft and non-tender without masses, organomegaly, or hernias noted. No hepatosplenomegaly. Extremities:  No clubbing or cyanosis. Neurologic:  Alert and oriented x 3. Psych:  Normal affect.  Assessment/Plan:  Chest pain I suspect that the patient has either coronary microvascular disease (Syndrome X) or coronary vasospasm.  She had a cath with no angiographic coronary disease in 2009 and Lexiscan myoview in 11/11 showed no evidence for ischemia or infarction. Repeat myoview in 7/13 showed no ischemia or infarction but was submaximal.  She continues to have periodic chest pain episodes responsive to NTG.  She cannot take Imdur due to headaches.  I am going to try her on ranolazine 500 mg bid for microvascular angina.  ECG in 2 wks for QT interval.  Followup in 1 month with PA to see if this has helped.   HTN  BP controlled.  Continue current regimen.  HYPERLIPIDEMIA  LDL quite high when recently checked.  She says that she has been taking atorvastatin on most days. I will increase atorvastatin to 80 mg daily with lipids/LFTs in 2 months.   [name] 11/02/2012

## 2012-11-03 ENCOUNTER — Ambulatory Visit (INDEPENDENT_AMBULATORY_CARE_PROVIDER_SITE_OTHER): Payer: Medicaid Other | Admitting: Family Medicine

## 2012-11-03 ENCOUNTER — Encounter: Payer: Self-pay | Admitting: Family Medicine

## 2012-11-03 VITALS — BP 133/95 | HR 111 | Temp 99.6°F | Ht 63.0 in | Wt 135.3 lb

## 2012-11-03 DIAGNOSIS — L299 Pruritus, unspecified: Secondary | ICD-10-CM

## 2012-11-03 DIAGNOSIS — F418 Other specified anxiety disorders: Secondary | ICD-10-CM

## 2012-11-03 DIAGNOSIS — F411 Generalized anxiety disorder: Secondary | ICD-10-CM

## 2012-11-03 DIAGNOSIS — M549 Dorsalgia, unspecified: Secondary | ICD-10-CM

## 2012-11-03 LAB — BASIC METABOLIC PANEL
Chloride: 104 mEq/L (ref 96–112)
Glucose, Bld: 82 mg/dL (ref 70–99)
Potassium: 4.6 mEq/L (ref 3.5–5.3)
Sodium: 137 mEq/L (ref 135–145)

## 2012-11-03 LAB — CBC
HCT: 39.8 % (ref 36.0–46.0)
Hemoglobin: 14.1 g/dL (ref 12.0–15.0)
MCHC: 35.4 g/dL (ref 30.0–36.0)
RBC: 4.67 MIL/uL (ref 3.87–5.11)
WBC: 4.7 10*3/uL (ref 4.0–10.5)

## 2012-11-03 MED ORDER — LORAZEPAM 1 MG PO TABS: 1.0000 mg | ORAL_TABLET | Freq: Three times a day (TID) | ORAL | Status: DC | PRN

## 2012-11-03 MED ORDER — TRIAMCINOLONE ACETONIDE 0.1 % EX CREA: TOPICAL_CREAM | Freq: Two times a day (BID) | CUTANEOUS | Status: DC

## 2012-11-03 NOTE — Assessment & Plan Note (Addendum)
Normal exam. Suspect irritation associated with shaving. Will do a trial of kenalog ointment. Advised no shaving. Check CBC and BUN/Cr to make sure elevated Hgb or uremia are not contributing to itching although this is unlikely given localized nature of pruritus.

## 2012-11-03 NOTE — Progress Notes (Signed)
Subjective:     Patient ID: [name] Rowe, female   DOB: 12/04/1960, 52 y.o.   MRN: [id number]  HPI 52 year old female with history of fibromyalgia, chronic low back pain status post lumbar back surgery x2 presents for followup to discuss the following:  1. Low back pain: Pain is persistent. Patient has been evaluated by Dr. Junie Spencer at pain management. The plan was to perform an epidural but they were not able to get this procedure approved by her insurance. Instead the patient gabapentin was increased to 300 mg 4 times a day. The patient reports persistent low back pain the pain is 9/10 severity. The pain is midline in the lumbar area. Just a pain that radiates to the left gluteal area as well as lateral hips. Occasionally the pain radiates anterior tibial groin. She is not had recent injury or fall. She walks unassisted. She is out of xanax which she takes for anxiety and as a muscle relaxer.   2. Groin/upper medial thigh itching: Patient has been seen bilateral groin. She denies rash. Negative inguinal for about 3 weeks. She has tried over-the-counter hydrocortisone and Lamisil as well as yogurt. She did have a similar rash many years ago that resolved and she was treated for her acute renal failure. She does tend to shave the area does not itch it recently.  Review of Systems As per history of present illness    Objective:   Physical Exam BP 133/95  Pulse 111  Temp(Src) 99.6 F (37.6 C) (Oral)  Ht 5\' 3"  (1.6 m)  Wt 135 lb 5 oz (61.377 kg)  BMI 23.98 kg/m2 General appearance: alert, cooperative and no distress Back: Process of undetermined lumbar area. There is tenderness in this area. There is also tenderness in the left piriformis area. Lower extremity strength is normal bilaterally. Sensation is intact. Straight leg raise is negative bilaterally. Bilateral hip pain with hip flexion.  Skin: Skin color, texture, turgor normal. No rashes or lesions particularly in groin area. Mild  erythema surrounding a few hair follicles.   Recent x-rays reviewed: lumbar and L hip. No acute findings.     Assessment and Plan:

## 2012-11-03 NOTE — Assessment & Plan Note (Signed)
A: chronic problem that has acutely worsened over the past 6-8 weeks despite adjustments in medical management.  P:  Continue gabapentin, recent dose adjustment.  Refilled xanax Obtain MRI of low back, ? Need for another surgery.

## 2012-11-03 NOTE — Patient Instructions (Addendum)
[name] Rowe,   Thank you for coming in to see me today. We will work on scheduling the MRI of your low back. Please continue current medication regimen.  I have refilled ativan today.   For itching, use kenalog cream. Checking labs. Will send results if normal and call you if abnormal.  F/u in 4 weeks.   Dr. Armen Pickup

## 2012-11-05 ENCOUNTER — Ambulatory Visit
Admission: RE | Admit: 2012-11-05 | Discharge: 2012-11-05 | Disposition: A | Discharge status: Home / Self Care | Payer: Medicaid Other | Source: Ambulatory Visit | Attending: Family Medicine | Admitting: Family Medicine

## 2012-11-05 DIAGNOSIS — M549 Dorsalgia, unspecified: Secondary | ICD-10-CM

## 2012-11-06 ENCOUNTER — Telehealth: Payer: Self-pay | Admitting: Family Medicine

## 2012-11-06 DIAGNOSIS — IMO0002 Reserved for concepts with insufficient information to code with codable children: Secondary | ICD-10-CM

## 2012-11-06 NOTE — Telephone Encounter (Signed)
Patient called about referral. Left VM. No need for blue team to call.

## 2012-11-08 ENCOUNTER — Telehealth: Payer: Self-pay | Admitting: Cardiology

## 2012-11-08 ENCOUNTER — Ambulatory Visit: Payer: Medicaid Other | Attending: Physical Medicine and Rehabilitation | Admitting: Physical Therapy

## 2012-11-08 DIAGNOSIS — M545 Low back pain, unspecified: Secondary | ICD-10-CM | POA: Insufficient documentation

## 2012-11-08 DIAGNOSIS — R5381 Other malaise: Secondary | ICD-10-CM | POA: Insufficient documentation

## 2012-11-08 DIAGNOSIS — IMO0001 Reserved for inherently not codable concepts without codable children: Secondary | ICD-10-CM | POA: Insufficient documentation

## 2012-11-08 NOTE — Telephone Encounter (Signed)
New Problem  Pt was seen on May 29 and was prescribed a new medication.  She said that her arms feel very heavy and her feet are swollen. She also said she is very tired. She wants to know if this could be because of the medication.

## 2012-11-08 NOTE — Telephone Encounter (Signed)
Pt states her BP this morning on waking up was 92/65. The most recent BP reading was 90/63. Pt states the only medication she took today was her ranexa and her thyroid medication. Pt also states she has heaviness in her left arm and shakiness in her hand that started when she was holding  a book last night and continues today. In addition she has noticed swelling in her feet and ankles(she does not know if her weight has changed, she does not weigh every day) and  increase in SOB. I will forward to Dr Shirlee Latch for review.

## 2012-11-08 NOTE — Telephone Encounter (Signed)
Spoke with patient.

## 2012-11-08 NOTE — Telephone Encounter (Signed)
Have her stop Ranexa for now and see if this helps symptoms.

## 2012-11-08 NOTE — Telephone Encounter (Signed)
Pt advised,verbalized understanding. 

## 2012-11-16 ENCOUNTER — Ambulatory Visit (INDEPENDENT_AMBULATORY_CARE_PROVIDER_SITE_OTHER): Payer: Medicaid Other | Admitting: *Deleted

## 2012-11-16 DIAGNOSIS — Z79899 Other long term (current) drug therapy: Secondary | ICD-10-CM

## 2012-11-16 NOTE — Progress Notes (Signed)
PT  CAME IN TODAY FOR EKG  DUE  TO TAKING RENEXA ./CY

## 2012-11-30 ENCOUNTER — Ambulatory Visit (INDEPENDENT_AMBULATORY_CARE_PROVIDER_SITE_OTHER): Payer: Medicaid Other | Admitting: Family Medicine

## 2012-11-30 ENCOUNTER — Encounter: Payer: Self-pay | Admitting: Physician Assistant

## 2012-11-30 ENCOUNTER — Ambulatory Visit (INDEPENDENT_AMBULATORY_CARE_PROVIDER_SITE_OTHER): Payer: Medicaid Other | Admitting: Physician Assistant

## 2012-11-30 ENCOUNTER — Telehealth: Payer: Self-pay | Admitting: *Deleted

## 2012-11-30 VITALS — BP 141/99 | HR 111 | Temp 98.0°F | Wt 140.0 lb

## 2012-11-30 VITALS — BP 148/102 | HR 107 | Ht 63.0 in | Wt 140.8 lb

## 2012-11-30 DIAGNOSIS — I10 Essential (primary) hypertension: Secondary | ICD-10-CM

## 2012-11-30 DIAGNOSIS — R000 Tachycardia, unspecified: Secondary | ICD-10-CM

## 2012-11-30 DIAGNOSIS — IMO0002 Reserved for concepts with insufficient information to code with codable children: Secondary | ICD-10-CM

## 2012-11-30 DIAGNOSIS — J309 Allergic rhinitis, unspecified: Secondary | ICD-10-CM

## 2012-11-30 DIAGNOSIS — Z0000 Encounter for general adult medical examination without abnormal findings: Secondary | ICD-10-CM

## 2012-11-30 DIAGNOSIS — I7789 Other specified disorders of arteries and arterioles: Secondary | ICD-10-CM

## 2012-11-30 DIAGNOSIS — E785 Hyperlipidemia, unspecified: Secondary | ICD-10-CM

## 2012-11-30 DIAGNOSIS — J302 Other seasonal allergic rhinitis: Secondary | ICD-10-CM

## 2012-11-30 DIAGNOSIS — I773 Arterial fibromuscular dysplasia: Secondary | ICD-10-CM

## 2012-11-30 DIAGNOSIS — R079 Chest pain, unspecified: Secondary | ICD-10-CM

## 2012-11-30 LAB — CBC WITH DIFFERENTIAL/PLATELET
Basophils Absolute: 0 10*3/uL (ref 0.0–0.1)
Eosinophils Absolute: 0.3 10*3/uL (ref 0.0–0.7)
Hemoglobin: 14.1 g/dL (ref 12.0–15.0)
Lymphocytes Relative: 15.6 % (ref 12.0–46.0)
Lymphs Abs: 1.4 10*3/uL (ref 0.7–4.0)
MCHC: 33.8 g/dL (ref 30.0–36.0)
Neutro Abs: 6.5 10*3/uL (ref 1.4–7.7)
RDW: 13.1 % (ref 11.5–14.6)

## 2012-11-30 LAB — BASIC METABOLIC PANEL
BUN: 17 mg/dL (ref 6–23)
CO2: 28 mEq/L (ref 19–32)
Calcium: 9.1 mg/dL (ref 8.4–10.5)
Glucose, Bld: 84 mg/dL (ref 70–99)
Sodium: 135 mEq/L (ref 135–145)

## 2012-11-30 MED ORDER — PANTOPRAZOLE SODIUM 40 MG PO TBEC: 40.0000 mg | DELAYED_RELEASE_TABLET | Freq: Every day | ORAL | Status: DC

## 2012-11-30 MED ORDER — SALINE NASAL SPRAY 0.65 % NA SOLN: 1.0000 | NASAL | Status: DC | PRN

## 2012-11-30 MED ORDER — LORATADINE 10 MG PO TABS: 10.0000 mg | ORAL_TABLET | Freq: Every day | ORAL | Status: DC

## 2012-11-30 MED ORDER — FLUTICASONE PROPIONATE 50 MCG/ACT NA SUSP: 2.0000 | Freq: Every day | NASAL | Status: DC

## 2012-11-30 NOTE — Assessment & Plan Note (Signed)
The patient has established care with [facility] neurosurgery group. I have requested the medical records.

## 2012-11-30 NOTE — Progress Notes (Signed)
Subjective:     Patient ID: [name] Rowe, female   DOB: [birth year], 52 y.o.   MRN: [id number]  HPI  52 year old female presents for follow visit to discuss the following:  #1 low back pain: I referred the patient to an open neurosurgery group after reviewing MRI that revealed L5-S1 disc protrusion with nerve impingement. She has been seen by the neurosurgeon and treated with prednisone tramadol. She states that neither medication has improved her symptoms. She states the tramadol makes her anxious  (as it has done in the past).She is scheduled to followup with the neurosurgeon and the month but was advised to call in 2 weeks for further instructions. She has had no new injuries or falls. She does have left-sided back pain. She has followup with pain management tomorrow.  #2 itching from the neck up: the patient is a sheath in the neck over the past week. There is no associated rash or shortness of breath. She's itching in her ears her nose and her throat. She takes Benadryl which helps with her symptoms. She is not disability to her itching.  #3 health maintenance: Patient due for mammogram.   Review of Systems As per HPI     Objective:   Physical Exam BP 141/99  Pulse 111  Temp(Src) 98 F (36.7 C) (Oral)  Wt 140 lb (63.504 kg)  BMI 24.81 kg/m2 General appearance: alert, cooperative and no distress Head: Normocephalic, without obvious abnormality, atraumatic Eyes: conjunctivae/corneas clear. PERRL, EOM's intact.  Ears: normal TM's and external ear canals both ears Nose: Nares normal. Septum midline. Mucosa normal. No drainage or sinus tenderness. Throat: lips, mucosa, and tongue normal; teeth and gums normal Lungs: clear to auscultation bilaterally and normal percussion bilaterally Heart: regular rate and rhythm, S1, S2 normal, no murmur, click, rub or gallop Back: L sided paraspinal tenderness. Negative straight leg raise.     Assessment and Plan:

## 2012-11-30 NOTE — Progress Notes (Signed)
[address] [place], [place]  [address] Phone: [phone] Fax:  [phone]  Date:  11/30/2012   ID:  Aquilla Solian, DOB 07/09/1960, MRN [id number]  PCP:  Dessa Phi, MD  Cardiologist:  Dr. [name]     History of Present Illness: [name] Rowe is a 52 y.o. female who returns for f/u.  She has a hx of fibromuscular dysplasia of the renal arteries, HTN, and possible coronary vasospasm versus microvascular angina. Patient was admitted to the hospital in [place] in 2009 with severe chest pain. At that time, she had a left heart cath showing no angiographic coronary disease but the catheter did induce RCA spasm, leading to concern that her chest pain might be coronary vasospasm. She also had HTN found to be probably related to fibromuscular dysplasia of the renal arteries. She had a stent placed in the right renal artery in 2009.    She was admitted twice in 1/13, the first time with a hypertensive urgency (she had been off her BP meds for a number of days). She was re-admitted several days later with orthostatic symptoms and probable rebound tachycardia (she had not picked up her beta blocker prescription after discharge). She was restarted on beta blocker and HCTZ was stopped. Carotids in 2/13 showed no significant stenosis and renal artery dopplers in 2/13 showed no significant stenosis.    She was admitted again in 7/13 with atypical chest pain. She had an ETT-myoview. This was a submaximal study but showed no ischemia or infarction, EF 77%. She was in the ER with chest pain in 10/13 but was sent home after cardiac enzymes and ECG were unremarkable. She was admitted again in 5/14 with atypical chest pain, ruled out for MI, and sent home.    She was seen by Dr. [name] 5/29 in f/u.  She continued to have atypical chest pain episodes (nonexertional) once or twice a week, sometimes severe. They are usually responsive to NTG. She had not tolerated  Imdur due to headache.  She was placed on Ranolazine.  LDL was very high and Lipitor was increased to 80 mg.  In the interim, she has had an MRI that demonstrates lumbar spine DDD and she has been referred to surgery.  F/u ECG with acceptable QTc interval.  The chart indicates she was experiencing some side effects and the recommendation was to hold Ranexa.    She tells me her hands and feet were swollen with the Ranexa and she had orange urine.  She stopped it and her symptoms resolved.  She still has chest discomfort 1-2 times per week. It typically occurs at rest. Nitroglycerin may provide minimal improvement. She describes it as a pinching or throbbing sensation. She denies any chest heaviness or pressure. She denies any radiating symptoms. She does have some dyspnea and dizziness associated. She denies associated nausea. She does have symptoms of nocturnal cough. She has also had increased allergy symptoms recently. This has been keeping her awake. She has had significant back pain. He was given a prednisone taper by the neurosurgeon. She took the initial dose last week and developed tachycardia. She did not continue the dose pack.  She denies exertional chest pain.  Blood pressures at home recently have been somewhat elevated.  Labs (10/11): LDL 136, HDL 44, TGs 159, creatinine 1.06, K 3.8  Labs (11/11): LDL 101, HDL 49, LFTs normal, K 4.8, creatinine 0.83  Labs (1/13): K 4.2, creatinine 1.16, urine metanephrines normal, PRA/aldosterone ratio normal,  LDL 156, HDL 56  Labs (7/13): K 3.8, creatinine 0.96, TSH normal, LDL 98, HDL 60  Labs (9/13): LDL 166, TSH normal, K 3.7, creatinine 1.0  Labs (5/14): K 3.6, creatinine 1.16=>0.96, LDL 168, HDL 51, Hgb 14.1,    Wt Readings from Last 3 Encounters:  11/30/12 140 lb 12.8 oz (63.866 kg)  11/03/12 135 lb 5 oz (61.377 kg)  11/02/12 136 lb (61.689 kg)    Past Medical History:  1. Migraine headaches  2. Chest pain: No angiographic coronary disease on  cath in 2009 in [place]. 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 Suspect coronary vasospasm versus microvascular obstruction. Lexiscan myoview (11/11): EF 79%, normal perfusion. ETT-myoview (9/13) with EF 77%, no ischemic or infarction but submaximal study. Headaches with Imdur.  3. HTN: Renal artery stenosis has played a role. Urinary metanephrines and plasma renin/aldosterone ratio normal.  4. 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 She had a renal artery stent in 2009. Renal artery dopplers (12/11) suggestive of fibromuscular dysplasia but no significant stenosis. CTA abdomen (1/13) with evidence for fibromuscular dysplasia, right renal artery stent appears patent. Renal artery dopplers (2/13) with FMD but no evidence for signficant stenosis.  5. Hyperlipidemia  6. Fibromyalgia  7. Echo (1/13) EF 55-60%, grade I diastolic dysfunction.  8. Hypothyroidism  9. Carotid dopplers (2/13): consistent with FMD but no significant stenosis.  10. Fibromuscular dysplasia.    Current Outpatient Prescriptions  Medication Sig Dispense Refill  . aspirin EC 81 MG tablet Take 81 mg by mouth daily.      Marland Kitchen atorvastatin (LIPITOR) 80 MG tablet Take 1 tablet (80 mg total) by mouth daily.  30 tablet  3  . chlorthalidone (HYGROTON) 25 MG tablet Take 25 mg by mouth daily.      . DULoxetine (CYMBALTA) 60 MG capsule Take 1 capsule (60 mg total) by mouth daily.  90 capsule  3  . ergocalciferol (VITAMIN D2) 50000 UNITS capsule Take 1 capsule (50,000 Units total) by mouth once a week. On monday  12 capsule  0  . gabapentin (NEURONTIN) 300 MG capsule Take one tab at 8AM, 1PM, 6PM and two tabs at 11PM  150 capsule  1  . labetalol (NORMODYNE) 200 MG tablet Take 4 tablets (800 mg total) by mouth 2 (two) times daily.  240 tablet  3  . levothyroxine (SYNTHROID, LEVOTHROID) 125 MCG tablet Take 1.5 tablets (187.5 mcg total) by mouth daily before breakfast.  45 tablet  6  . LORazepam (ATIVAN) 1 MG tablet Take 1  tablet (1 mg total) by mouth every 8 (eight) hours as needed for anxiety.  30 tablet  5  . lubiprostone (AMITIZA) 24 MCG capsule Take 1 capsule (24 mcg total) by mouth daily as needed. For irritable bowel syndrome w/ constipation  90 capsule  3  . NIFEdipine (PROCARDIA XL/ADALAT-CC) 90 MG 24 hr tablet Take 90 mg by mouth daily.       . nitroGLYCERIN (NITROSTAT) 0.4 MG SL tablet Place 0.4 mg under the tongue every 5 (five) minutes as needed. x3 doses as needed for chest pain      . ondansetron (ZOFRAN ODT) 8 MG disintegrating tablet Take 1 tablet (8 mg total) by mouth every 8 (eight) hours as needed for nausea.  20 tablet  0  . potassium chloride SA (K-DUR,KLOR-CON) 20 MEQ tablet Take 20 mEq by mouth daily.      Marland Kitchen rOPINIRole (REQUIP) 0.25 MG tablet Take 0.5 mg by mouth at bedtime. Start one tab daily,  increase by one tab every 3-4 days until 2 mg daily.      Marland Kitchen spironolactone (ALDACTONE) 50 MG tablet Take 1 tablet (50 mg total) by mouth daily.  30 tablet  3  . triamcinolone cream (KENALOG) 0.1 % Apply topically 2 (two) times daily. Apply to groin  30 g  0  . zolpidem (AMBIEN) 5 MG tablet Take 1-2 tablets (5-10 mg total) by mouth at bedtime as needed. For sleep  30 tablet  2   No current facility-administered medications for this visit.    Allergies:    Allergies  Allergen Reactions  . Hydromorphone Hcl Other (See Comments)    Palpitations  . Prochlorperazine Edisylate Other (See Comments)    Seizure   . Sumatriptan Palpitations    Social History:  The patient  reports that she has never smoked. She has never used smokeless tobacco. She reports that she does not drink alcohol or use illicit drugs.   ROS:  Please see the history of present illness.      All other systems reviewed and negative.   PHYSICAL EXAM: VS:  BP 148/102  Pulse 107  Ht 5\' 3"  (1.6 m)  Wt 140 lb 12.8 oz (63.866 kg)  BMI 24.95 kg/m2 Well nourished, well developed, in no acute distress HEENT: normal Neck: no  JVD Cardiac:  normal S1, S2; RRR; no murmur Lungs:  clear to auscultation bilaterally, no wheezing, rhonchi or rales Abd: soft, nontender, no hepatomegaly Ext: no edema Skin: warm and dry Neuro:  CNs 2-12 intact, no focal abnormalities noted  EKG:  Sinus tachycardia, HR 107, normal axis     ASSESSMENT AND PLAN:  1. Chest Pain:  Atypical. She has minimal improvement with nitroglycerin. She did not tolerate Ranexa. She has been unable to tolerate isosorbide because of headaches. She is already on a very large dose of beta blocker and also takes calcium channel blocker therapy. She does have some nocturnal cough. I question whether or not she may have a component of GERD. She tells me she was on a PPI at one point the past. I will try her on Protonix 40 mg daily. 2. Hypertension:  Uncontrolled. Heart rate is also elevated today. I believe that this is likely a combination of pain from her back as well as lack of sleep from increased allergy symptoms.  It has been over a week since she took one dose of prednisone. I do not think this is causing any problems at this time.  I will have her continue to monitor her blood pressures at home. I will bring her back next week to see the nurse to recheck her blood pressure. If it continues to remain elevated, consider increasing nifedipine back to 120 mg daily. Of note, in the hospital, her blood pressure was much improved. She does admit to compliance with all medications. I have provided her with a 2 g sodium diet as well.  I suspect she has a high salt diet. Check a basic metabolic panel and CBC today. If her creatinine has changed, consider repeat renal artery Dopplers. 3. Tachycardia: I suspect that this is a combination of increased pain, lack of sleep and increased anxiety. Continue current dose of beta blocker for now. Follow up as noted above. Check CBC. 4. Hyperlipidemia:  Lipitor recently adjusted. 5. Disposition:  Follow up with Dr. Shirlee Latch or me in one  month.  Signed, Tereso Newcomer, PA-C  11/30/2012 9:04 AM

## 2012-11-30 NOTE — Telephone Encounter (Signed)
Message copied by Tarri Fuller on Thu Nov 30, 2012  5:29 PM ------      Message from: [place], [place] T      Created: Thu Nov 30, 2012  1:53 PM       Labs ok      Continue with current treatment plan.      Tereso Newcomer, PA-C        11/30/2012 1:53 PM ------

## 2012-11-30 NOTE — Patient Instructions (Addendum)
[name],  Thank you for coming in to see me today. I'm happy to hear that you were evaluated by the neurosurgeon. I will obtain the office notes from your visit.  Regarding mammogram, please call to schedule at your convenience.  Regarding itching from the neck up, your exam is normal. His worth trying Claritin during the day and Flonase at night. He also use nasal saline throughout the day for nose itching.  F/u in 4-6 weeks.  Dr. Armen Pickup

## 2012-11-30 NOTE — Assessment & Plan Note (Signed)
A: itching concerning for seasonal allergies. Normal exam. P:  Will treat with Claritin daily, Flonase nightly and when necessary nasal saline.

## 2012-11-30 NOTE — Patient Instructions (Addendum)
Your physician has recommended you make the following change in your medication: Start Protonix ( 40 mg ) daily I sent script in to your pharmacy  Your physician recommends that you have lab work today: bmet/cbc, our office will call you back with the results  Please schedule appointment for next week to come into our office for a nurses visist to check BP  And BP cuff  Your physician recommends that you schedule a follow-up appointment with Tereso Newcomer on Thursday, 7/31 @ 9:50 am  22 Gram Low Sodium Diet A 2 gram sodium diet restricts the amount of sodium in the diet to no more than 2 g or 2000 mg daily. Limiting the amount of sodium is often used to help lower blood pressure. It is important if you have heart, liver, or kidney problems. Many foods contain sodium for flavor and sometimes as a preservative. When the amount of sodium in a diet needs to be low, it is important to know what to look for when choosing foods and drinks. The following includes some information and guidelines to help make it easier for you to adapt to a low sodium diet. QUICK TIPS  Do not add salt to food.  Avoid convenience items and fast food.  Choose unsalted snack foods.  Buy lower sodium products, often labeled as "lower sodium" or "no salt added."  Check food labels to learn how much sodium is in 1 serving.  When eating at a restaurant, ask that your food be prepared with less salt or none, if possible. READING FOOD LABELS FOR SODIUM INFORMATION The nutrition facts label is a good place to find how much sodium is in foods. Look for products with no more than 500 to 600 mg of sodium per meal and no more than 150 mg per serving. Remember that 2 g = 2000 mg. The food label may also list foods as:  Sodium-free: Less than 5 mg in a serving.  Very low sodium: 35 mg or less in a serving.  Low-sodium: 140 mg or less in a serving.  Light in sodium: 50% less sodium in a serving. For example, if a food that  usually has 300 mg of sodium is changed to become light in sodium, it will have 150 mg of sodium.  Reduced sodium: 25% less sodium in a serving. For example, if a food that usually has 400 mg of sodium is changed to reduced sodium, it will have 300 mg of sodium. CHOOSING FOODS Grains  Avoid: Salted crackers and snack items. Some cereals, including instant hot cereals. Bread stuffing and biscuit mixes. Seasoned rice or pasta mixes.  Choose: Unsalted snack items. Low-sodium cereals, oats, puffed wheat and rice, shredded wheat. English muffins and bread. Pasta. Meats  Avoid: Salted, canned, smoked, spiced, pickled meats, including fish and poultry. Bacon, ham, sausage, cold cuts, hot dogs, anchovies.  Choose: Low-sodium canned tuna and salmon. Fresh or frozen meat, poultry, and fish. Dairy  Avoid: Processed cheese and spreads. Cottage cheese. Buttermilk and condensed milk. Regular cheese.  Choose: Milk. Low-sodium cottage cheese. Yogurt. Sour cream. Low-sodium cheese. Fruits and Vegetables  Avoid: Regular canned vegetables. Regular canned tomato sauce and paste. Frozen vegetables in sauces. Olives. Rosita Fire. Relishes. Sauerkraut.  Choose: Low-sodium canned vegetables. Low-sodium tomato sauce and paste. Frozen or fresh vegetables. Fresh and frozen fruit. Condiments  Avoid: Canned and packaged gravies. Worcestershire sauce. Tartar sauce. Barbecue sauce. Soy sauce. Steak sauce. Ketchup. Onion, garlic, and table salt. Meat flavorings and tenderizers.  Choose:  Fresh and dried herbs and spices. Low-sodium varieties of mustard and ketchup. Lemon juice. Tabasco sauce. Horseradish. SAMPLE 2 GRAM SODIUM MEAL PLAN Breakfast / Sodium (mg)  1 cup low-fat milk / 143 mg  2 slices whole-wheat toast / 270 mg  1 tbs heart-healthy margarine / 153 mg  1 hard-boiled egg / 139 mg  1 small orange / 0 mg Lunch / Sodium (mg)  1 cup raw carrots / 76 mg   cup hummus / 298 mg  1 cup low-fat milk /  143 mg   cup red grapes / 2 mg  1 whole-wheat pita bread / 356 mg Dinner / Sodium (mg)  1 cup whole-wheat pasta / 2 mg  1 cup low-sodium tomato sauce / 73 mg  3 oz lean ground beef / 57 mg  1 small side salad (1 cup raw spinach leaves,  cup cucumber,  cup yellow bell pepper) with 1 tsp olive oil and 1 tsp red wine vinegar / 25 mg Snack / Sodium (mg)  1 container low-fat vanilla yogurt / 107 mg  3 graham cracker squares / 127 mg Nutrient Analysis  Calories: 2033  Protein: 77 g  Carbohydrate: 282 g  Fat: 72 g  Sodium: 1971 mg Document Released: 05/24/2005 Document Revised: 08/16/2011 Document Reviewed: 08/25/2009 [employer] Patient Information 2014 [place], [place].

## 2012-11-30 NOTE — Assessment & Plan Note (Signed)
Due for mammogram. Handout provided

## 2012-11-30 NOTE — Telephone Encounter (Signed)
pt notified about lab results with verbal understanding  

## 2012-12-01 ENCOUNTER — Encounter: Payer: Medicaid Other | Attending: Physical Medicine and Rehabilitation | Admitting: Physical Medicine & Rehabilitation

## 2012-12-01 ENCOUNTER — Encounter: Payer: Self-pay | Admitting: Physical Medicine & Rehabilitation

## 2012-12-01 VITALS — BP 141/98 | HR 106 | Resp 14 | Ht 63.0 in | Wt 141.0 lb

## 2012-12-01 DIAGNOSIS — M5126 Other intervertebral disc displacement, lumbar region: Secondary | ICD-10-CM | POA: Insufficient documentation

## 2012-12-01 DIAGNOSIS — M5416 Radiculopathy, lumbar region: Secondary | ICD-10-CM

## 2012-12-01 DIAGNOSIS — IMO0002 Reserved for concepts with insufficient information to code with codable children: Secondary | ICD-10-CM

## 2012-12-01 DIAGNOSIS — IMO0001 Reserved for inherently not codable concepts without codable children: Secondary | ICD-10-CM | POA: Insufficient documentation

## 2012-12-01 MED ORDER — GABAPENTIN 600 MG PO TABS: 600.0000 mg | ORAL_TABLET | Freq: Four times a day (QID) | ORAL | Status: DC

## 2012-12-01 NOTE — Progress Notes (Signed)
Subjective:    Patient ID: [name] Rowe, female    DOB: 1961-04-01, 52 y.o.   MRN: [id number]  HPI  This is an initial encounter for me with [name] Rowe who was previously a pt of Dr. Pamelia Hoit. She has struggled with a long history of FMS as well as back and leg pain. She had a L2-3 fusion remotely in [place] and really has had persistent pain since then. More recently, she has developed increased low back and left hip pain. Dr. Pamelia Hoit felt that there might be a trochanteric bursitis component. Her PCP saw her and ordered an MRI of the back last month which revealed:    L5-S1: A left paracentral disc protrusion contacts and displaces  the left S1 nerve root within the lateral recess. The foramina are  patent bilaterally  She saw Dr. Franky Macho apparently who rx'ed her tramadol. She didn't like the tramadol as it made her very sluggish. Her gabapentin was also increased at this office and hasn't had a dramatic impact.   [name] Rowe preference is not to use any opiates.   Today she tells me that her back is bothering her with any movements. She is having pain running from the low back down the posterior aspect of the hip into the foot. She is finding it difficult to sleep despite trying numerous pillows, positions, etc.    Pain Inventory Average Pain 9 Pain Right Now 8 My pain is sharp, dull and aching  In the last 24 hours, has pain interfered with the following? General activity 7 Relation with others 5 Enjoyment of life 9 What TIME of day is your pain at its worst? constant Sleep (in general) Fair  Pain is worse with: walking, bending, sitting and standing Pain improves with: pacing activities Relief from Meds: 6  Mobility walk without assistance how many minutes can you walk? 10-15 do you drive?  yes  Function Do you have any goals in this area?  yes  Neuro/Psych depression anxiety  Prior Studies CT/MRI  Physicians involved in your care Dr Ina Homes,  Dr Franky Macho   Family History  Problem Relation Age of Onset  . Heart attack    . Stroke    . Hypertension Mother   . Asthma Mother   . Hyperlipidemia Mother   . Stroke Mother   . Hypertension Father   . Cancer Father     Prostate   . Heart attack Father   . Hyperlipidemia Father   . Stroke Father   . Hypertension Brother     Died from a massive stroke in setting of severe HTN at 16 (had had MI at 54)  . Heart attack Brother   . Early death Brother 29    stroke   . Hyperlipidemia Brother   . Metabolic syndrome Sister   . Depression Sister   . Hypertension Sister   . Diabetes Maternal Aunt    History   Social History  . Marital Status: Married    Spouse Name: N/A    Number of Children: 2  . Years of Education: N/A   Occupational History  . worked in a Museum/gallery conservator.     Quit about a yr ago because fo work related injury to left knee   Social History Main Topics  . Smoking status: Never Smoker   . Smokeless tobacco: Never Used  . Alcohol Use: No  . Drug Use: No  . Sexually Active: Not Currently   Other Topics Concern  . None  Social History Narrative   Husband is currently in jail for an offense committed in [place] and he is awaiting extradition.   Lives alone.    Jehovah's witness.    Walks 20-30 minutes.             Past Surgical History  Procedure Laterality Date  . Renal artery stent  2009  . Spine surgery    . Back surgery  2010 and 2011    In [place].  11/19/2008 (possibly a laminectomy) and in May of 2011 she underwent a L2-L3 fusion.  . Robotic assisted lap vaginal hysterectomy  05/13/2008    [facility]. da Vinci hysterectomy  for myomatous uterus    Past Medical History  Diagnosis Date  . Migraine headache   . Chest pain     a. No angiographic CAD by cath 2009 in [place]. There was catheter-induced RCA vasospasm versus microvascular obstruction. bEugenie Birks myoview 04/2010 - EF 79%, normal perfusion.;  c. ETT-Myoview 7/13: no ischemia, EF  77%, submax exercise  . Renal artery stenosis     a. Due to fibromuscular dysplasia. b. Renal stent 2009. c. Studies:  CTA abdomen (1/13) with evidence for fibromuscular dysplasia, right renal artery stent appears patent. Renal artery dopplers (2/13) with FMD but no evidence for signficant stenosis.   . Hypothyroidism   . Fibromuscular dysplasia 2009    a. Renal artery stenosis due to this. b. Carotid dopplers 07/2011 c/w FMD but no significant stenosis  . Tachycardia 2000    10/27/10-Holter -NSR with occ sinus tachy-rare PACs, no sig arrhythmia  . Hx of echocardiogram     a. Echo (1/13): EF 55-60%, grade 1 diastolic dysfunction  . HTN (hypertension) 1988    a. Likely related to RAS; urinary metanephrines and plasma renin/aldosterone ratio normal. b. H/o HTN urgency 06/2011 after being off BP meds for a number of days  . HLD (hyperlipidemia) 2000  . Myocardial infarction 2009  . Anxiety 2001  . Depression 2001  . Hypertensive crisis 06/29/2011  . Leiomyoma   . Adenomyosis   . CVA (cerebral vascular accident) 2006  . Fibromyalgia 1999  . Coronary vasospasm   . Chronic back pain   . Hearing loss of left ear     s/p CVA 2006   BP 141/98  Pulse 106  Resp 14  Ht 5\' 3"  (1.6 m)  Wt 141 lb (63.957 kg)  BMI 24.98 kg/m2  SpO2 99%     Review of Systems  Constitutional: Positive for diaphoresis.  Respiratory: Positive for shortness of breath.   Gastrointestinal: Positive for constipation.  Psychiatric/Behavioral: Positive for dysphoric mood. The patient is nervous/anxious.   All other systems reviewed and are negative.       Objective:   Physical Exam  She is a well-developed, well-nourished woman who does not appear in any  distress . She is very pleasant!!  She is oriented x3. Her speech is clear. Affect is overall  bright, alert. She is cooperative and pleasant. Follows commands  without difficulty. Answers my questions appropriately. Cranial nerves  and coordination are  intact.  Her reflexes are 2+in both upper and  lower extremities except the achilles which were perhaps slightly diminished. Hoffman sign is negative bilaterally in the upper  extremities.Her motor strength is overall quite good( 5/5 ) in both upper and lower extremities.  She transitions from sitting to standing slowly. Her gait is non antalgic. Tandem gait and  Romberg test are performed adequately.  She has  very limited range of motion in her back, very little forward flexion or extension. She  has very limited side bending as well. Internal and external rotation at the hips bilaterally also  aggravates her does not provoke hip pain.  She reports intact sensation in the upper  extremities to pinprick and light touch. Intact sensationin the lower extremities,  with pinprick,proprioception and vibratory sensation are intact.  She has a verticle scar over both lateral hips and lumbar spine. SLR was positive on the left Assessment & Plan:   Assessment:  1. Status post lumbar fusion L2-3 with chronic low back pain.  2. 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 Left hip pain, ?greater troch, although more likely related to radiculopathy 4. Fibromyalgia syndrome.   Plan:  1. HEP per PT recommendations. We discussed the long term importance of this. 2. Will make a referral to Dr. Wynn Banker for L5-S1 translaminar ESI paracentral to the left  3. Increase gabapentin to 600mg  BID and 1200mg  qhs  4. Tylenol 500 mg 4 times a day --may continue Patient states desire to avoid opioid narcotic pain medication  5. Follow up with NS as directed 6. Consider left trochanteric bursitis injection if pain doesn't improve with above intervention. 7. 30 minutes of face to face patient care time were spent during this visit. All questions were encouraged and answered.

## 2012-12-01 NOTE — Patient Instructions (Signed)
TRY TO STAY AS ACTIVE AS POSSIBLE

## 2012-12-04 ENCOUNTER — Other Ambulatory Visit: Payer: Self-pay

## 2012-12-04 DIAGNOSIS — Z1231 Encounter for screening mammogram for malignant neoplasm of breast: Secondary | ICD-10-CM

## 2012-12-06 ENCOUNTER — Ambulatory Visit (INDEPENDENT_AMBULATORY_CARE_PROVIDER_SITE_OTHER): Payer: Medicaid Other | Admitting: *Deleted

## 2012-12-06 VITALS — BP 140/110 | HR 105

## 2012-12-06 DIAGNOSIS — I10 Essential (primary) hypertension: Secondary | ICD-10-CM

## 2012-12-06 MED ORDER — CLONIDINE HCL 0.1 MG PO TABS: 0.1000 mg | ORAL_TABLET | Freq: Two times a day (BID) | ORAL | Status: DC

## 2012-12-06 NOTE — Progress Notes (Signed)
Patient returns today for a scheduled blood pressure check per recent visit with Tereso Newcomer.  Blood pressure today is 140/110, HR 105 which correlates with her home bp cuff which she brought in today also.  Patient states that she has fibromyalgia and does not sleep at night due to back pain and she feels that is why her bp is elevated.  Vitals discussed with Dr. Shirlee Latch, her MD, and he has added Clonidine 0.1mg  twice daily to her medications.  Pt has follow up scheduled with Dr. Shirlee Latch later this month.  Advised patient to check her bp and pulse daily about 1.5 hours after taking her medications and keep a good record.  In addition, she was instructed to call  Katina Dung in about 2 weeks with her readings.  Pt agreed to plan.

## 2012-12-13 IMAGING — CR DG CHEST 1V PORT
1 series · 1 of 1 positions shown · non-contrast
Comparison: Chest x-ray 06/29/2011.

CLINICAL DATA: Mid chest pressure.  History of hypertension.

PORTABLE CHEST - 1 VIEW

[AP]
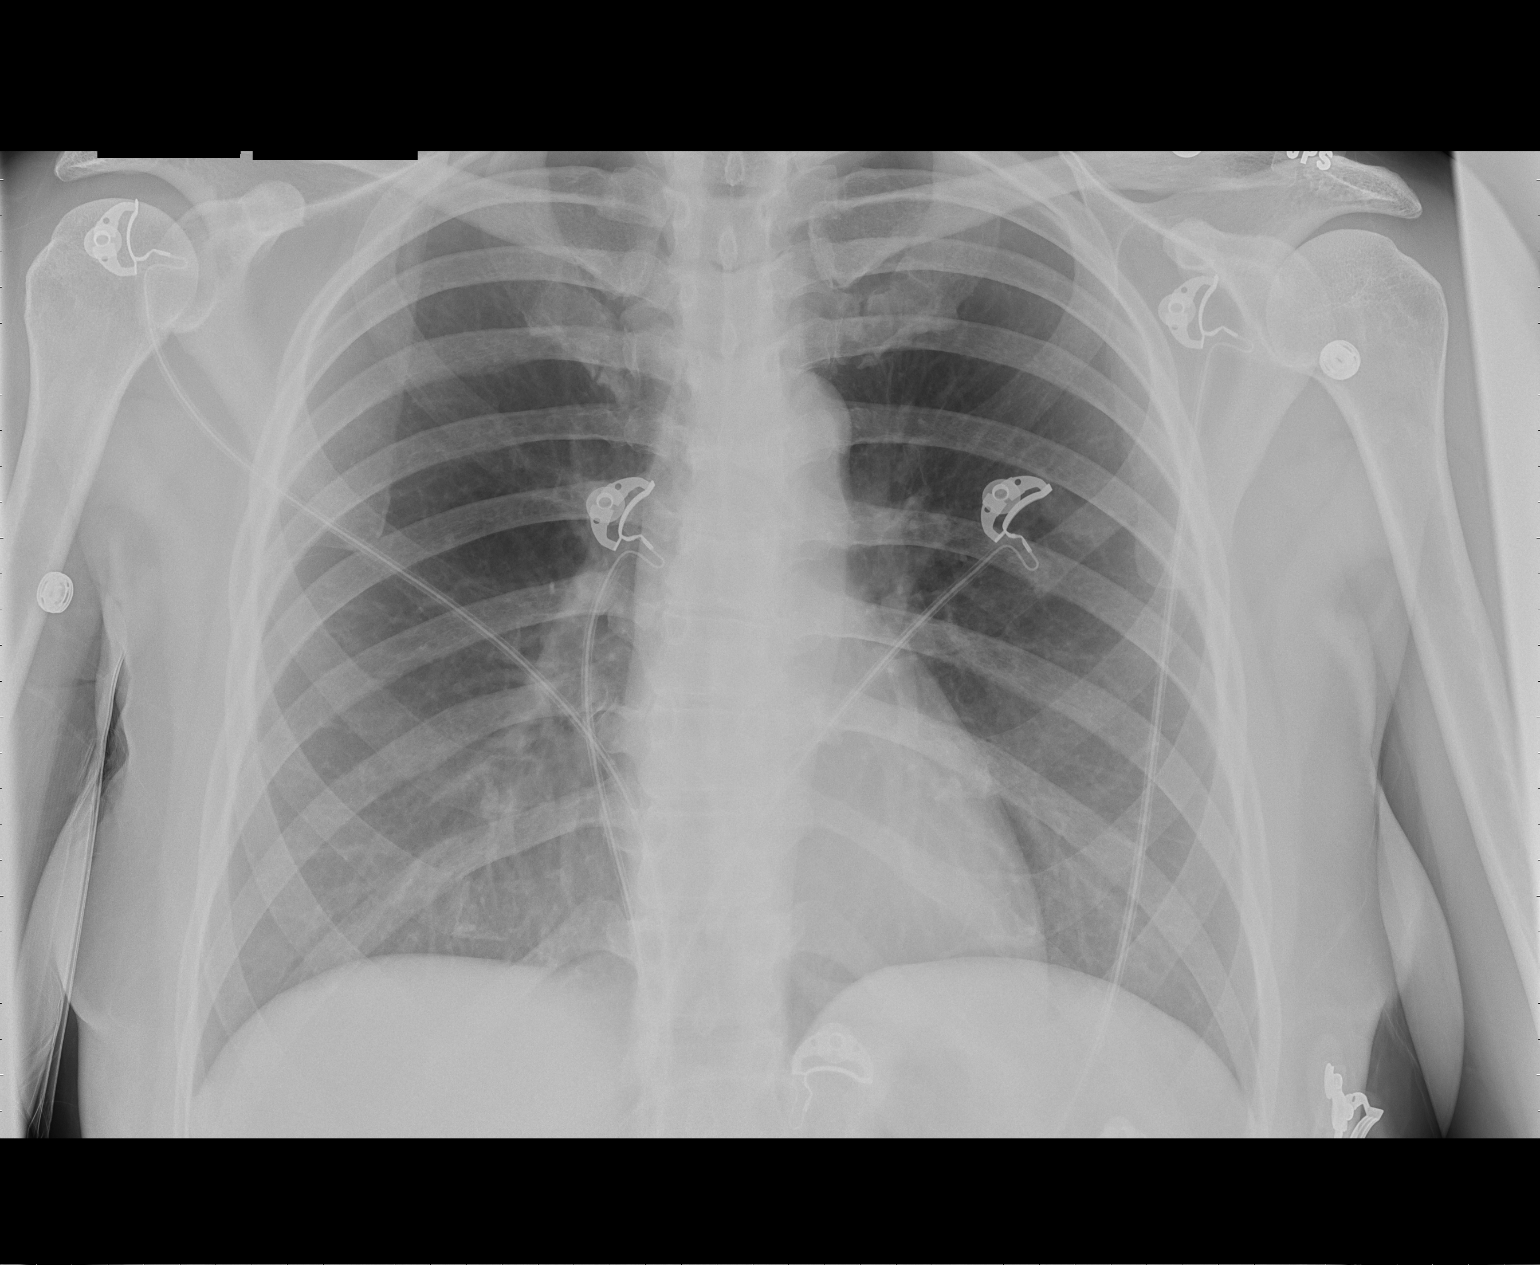

[1 of 1 positions shown; findings below may reference images not displayed]

FINDINGS: Lung volumes are normal.  No consolidative airspace
disease.  No pleural effusions.  No pneumothorax.  No pulmonary
nodule or mass noted.  Pulmonary vasculature and the
cardiomediastinal silhouette are within normal limits.
IMPRESSION: No radiographic evidence of acute cardiopulmonary disease.

## 2012-12-15 ENCOUNTER — Telehealth: Payer: Self-pay | Admitting: Cardiology

## 2012-12-15 NOTE — Telephone Encounter (Signed)
LMTCB

## 2012-12-15 NOTE — Telephone Encounter (Signed)
New problem   Pt having symptoms of feeling sick when taking clonidine

## 2012-12-18 ENCOUNTER — Other Ambulatory Visit (INDEPENDENT_AMBULATORY_CARE_PROVIDER_SITE_OTHER): Payer: Medicaid Other

## 2012-12-18 DIAGNOSIS — I10 Essential (primary) hypertension: Secondary | ICD-10-CM

## 2012-12-18 DIAGNOSIS — E785 Hyperlipidemia, unspecified: Secondary | ICD-10-CM

## 2012-12-18 DIAGNOSIS — R079 Chest pain, unspecified: Secondary | ICD-10-CM

## 2012-12-18 DIAGNOSIS — I701 Atherosclerosis of renal artery: Secondary | ICD-10-CM

## 2012-12-18 LAB — HEPATIC FUNCTION PANEL
ALT: 22 U/L (ref 0–35)
Alkaline Phosphatase: 75 U/L (ref 39–117)
Bilirubin, Direct: 0 mg/dL (ref 0.0–0.3)
Total Protein: 7.4 g/dL (ref 6.0–8.3)

## 2012-12-18 LAB — LIPID PANEL
Cholesterol: 243 mg/dL — ABNORMAL HIGH (ref 0–200)
HDL: 51.4 mg/dL
Total CHOL/HDL Ratio: 5
Triglycerides: 95 mg/dL (ref 0.0–149.0)
VLDL: 19 mg/dL (ref 0.0–40.0)

## 2012-12-18 LAB — LDL CHOLESTEROL, DIRECT: Direct LDL: 179 mg/dL

## 2012-12-18 NOTE — Telephone Encounter (Signed)
Spoke with patient to report Dr. Alford Highland advice to remain off Clonidine and to seek emergency medical attention if recurrence of syncope.  Patient verbalized understanding and will monitor BP.  Patient will report elevated BPs that are consistently greater than 140s over 90s when patient is not in pain.  Patient verbalized understanding and agreement with plan of care and thanked me for our help.

## 2012-12-18 NOTE — Telephone Encounter (Signed)
If she developed symptoms on clonidine, stay off clonidine.  If she has a recurrence of syncope, needs ER evaluation.

## 2012-12-18 NOTE — Telephone Encounter (Signed)
Spoke with patient who states on Thursday 7/10 the patient was nauseated and felt like she might vomit.  Patient states that she went into the bathroom and she does not know whether she fell asleep or passed out but that when she opened her eyes she immediately vomited on herself and on the floor. Patient states she has been nauseated since starting Clonidine 7/7.  Patient states she stopped taking the Clonidine on 7/10 after the episode of vomiting.  BP today is 128/70, HR 89.  Patient states she feels a little "tired" on her left side.  Patient speaks clearly, is a good historian, and is alert and oriented. I am routing message to Dr. Shirlee Latch for advice.  Patient verbalized understanding and agreement with plan and verbalized agreement to call us if she has additional symptoms and/or concerns prior to a return call back.

## 2012-12-18 NOTE — Telephone Encounter (Signed)
LMTCB

## 2012-12-25 ENCOUNTER — Ambulatory Visit
Admission: RE | Admit: 2012-12-25 | Discharge: 2012-12-25 | Disposition: A | Discharge status: Home / Self Care | Payer: Medicaid Other | Source: Ambulatory Visit

## 2012-12-25 DIAGNOSIS — Z1231 Encounter for screening mammogram for malignant neoplasm of breast: Secondary | ICD-10-CM

## 2012-12-29 ENCOUNTER — Ambulatory Visit (INDEPENDENT_AMBULATORY_CARE_PROVIDER_SITE_OTHER): Payer: Medicaid Other | Admitting: Family Medicine

## 2012-12-29 ENCOUNTER — Encounter: Payer: Self-pay | Admitting: Family Medicine

## 2012-12-29 VITALS — BP 131/97 | HR 103 | Temp 98.5°F | Ht 63.0 in | Wt 138.0 lb

## 2012-12-29 DIAGNOSIS — I10 Essential (primary) hypertension: Secondary | ICD-10-CM

## 2012-12-29 DIAGNOSIS — IMO0002 Reserved for concepts with insufficient information to code with codable children: Secondary | ICD-10-CM

## 2012-12-29 NOTE — Assessment & Plan Note (Signed)
A: BP well controlled today. Although the patient is intermittently dizzy she is not orthostatic. Do not suspect that she is having a significant GI bleed that could have dropped her hemoglobin from last check. P: Her clonidine from her medication list. No new medications. No new labs. Patient suffers from polypharmacy and any one of a number of her medications could be causing intermittent dizziness.

## 2012-12-29 NOTE — Assessment & Plan Note (Signed)
Patient followed by neurosurgery. Schedule for intraluminal steroid injection. I have asked the patient will call me postinjection to give me an update on her symptoms.

## 2012-12-29 NOTE — Patient Instructions (Addendum)
Mrs. [name] Rowe,  Thank you for coming in today.  I have filled out application for disability parking placard. We will start with a temporary 6 month plaque or. I decided that you have your of low back injection next week I am hopeful that this will be the significant relief.  Regarding her dizziness: your orthostatic vital signs are negative. I believe this is residual didn't dizziness and a previous stroke. I agree with you not taking clonidine and have removed his only medication list.  Dr. Armen Pickup

## 2012-12-29 NOTE — Progress Notes (Signed)
Subjective:     Patient ID: [name] Rowe, female   DOB: Mar 11, 1961, 52 y.o.   MRN: [id number]  HPI A 52 year old female presents for follow visit discuss the following:  #1 hypertension: Patient was hypertensive and started on clonidine by her cardiologist. Patient and had episode of dizziness associated with syncope and nausea. She called her cardiologist office was advised to stop the clonidine. She stopped the clonidine. She still has some dizziness that is not related to position. She is a history of vertigo but states this is different. The symptoms last for less than a minute. She has not had recurrent nausea. She has had some increase in her anxiety.   #2 chronic low back pain: Patient with chronic low back pain with radicular symptoms down her left leg. She also has some left leg weakness. She is scheduled to have a translaminar epidural steroid injection done by her neurosurgeon, Dr. Franky Macho next week. Review of Systems As per HPI GI: patient denies hematochezia and melena.     Objective:   Physical Exam BP 121/88  Pulse 103  Temp(Src) 98.5 F (36.9 C) (Oral)  Ht 5\' 3"  (1.6 m)  Wt 138 lb (62.596 kg)  BMI 24.45 kg/m2 Orthostatic vital signs obtained and are negative. General appearance: alert, cooperative and no distress Neck: no adenopathy, no carotid bruit, no JVD, supple, symmetrical, trachea midline and thyroid not enlarged, symmetric, no tenderness/mass/nodules Lungs: clear to auscultation bilaterally Heart: regular rate and rhythm, S1, S2 normal, no murmur, click, rub or gallop Extremities: extremities normal, atraumatic, no cyanosis or edema Neuro:  PERRLA, EOMI, 4+/5 strength RUE and 4/5 LLE. Gait normal.     Assessment and Plan:

## 2013-01-02 ENCOUNTER — Ambulatory Visit: Payer: Medicaid Other | Admitting: Physical Medicine & Rehabilitation

## 2013-01-04 ENCOUNTER — Encounter: Payer: Self-pay | Admitting: Physician Assistant

## 2013-01-04 ENCOUNTER — Ambulatory Visit (INDEPENDENT_AMBULATORY_CARE_PROVIDER_SITE_OTHER): Payer: Medicaid Other | Admitting: Physician Assistant

## 2013-01-04 ENCOUNTER — Telehealth: Payer: Self-pay | Admitting: *Deleted

## 2013-01-04 ENCOUNTER — Encounter: Payer: Self-pay | Admitting: Internal Medicine

## 2013-01-04 VITALS — BP 125/92 | HR 84 | Ht 64.0 in | Wt 138.0 lb

## 2013-01-04 DIAGNOSIS — R42 Dizziness and giddiness: Secondary | ICD-10-CM

## 2013-01-04 DIAGNOSIS — I10 Essential (primary) hypertension: Secondary | ICD-10-CM

## 2013-01-04 DIAGNOSIS — R079 Chest pain, unspecified: Secondary | ICD-10-CM

## 2013-01-04 DIAGNOSIS — K219 Gastro-esophageal reflux disease without esophagitis: Secondary | ICD-10-CM

## 2013-01-04 DIAGNOSIS — R112 Nausea with vomiting, unspecified: Secondary | ICD-10-CM

## 2013-01-04 DIAGNOSIS — K5900 Constipation, unspecified: Secondary | ICD-10-CM

## 2013-01-04 LAB — BASIC METABOLIC PANEL
BUN: 18 mg/dL (ref 6–23)
Calcium: 9.1 mg/dL (ref 8.4–10.5)
GFR: 58.01 mL/min — ABNORMAL LOW (ref >60.00)
Glucose, Bld: 127 mg/dL — ABNORMAL HIGH (ref 70–99)

## 2013-01-04 LAB — LIPASE: Lipase: 21 U/L (ref 11.0–59.0)

## 2013-01-04 LAB — CBC WITH DIFFERENTIAL/PLATELET
Basophils Absolute: 0 10*3/uL (ref 0.0–0.1)
Lymphocytes Relative: 17.2 % (ref 12.0–46.0)
Lymphs Abs: 1.2 10*3/uL (ref 0.7–4.0)
Monocytes Relative: 5.1 % (ref 3.0–12.0)
Platelets: 288 10*3/uL (ref 150.0–400.0)
RDW: 13.1 % (ref 11.5–14.6)

## 2013-01-04 MED ORDER — ONDANSETRON HCL 4 MG PO TABS: 4.0000 mg | ORAL_TABLET | Freq: Three times a day (TID) | ORAL | Status: DC | PRN

## 2013-01-04 NOTE — Progress Notes (Signed)
[address] [place], [place]  [address] Phone: [phone] Fax:  [phone]  Date:  01/04/2013   ID:  [name] Rowe, DOB June 29, 1960, MRN [id number]  PCP:  Marikay Alar, MD  Cardiologist:  Dr. [name]     History of Present Illness: [name] Rowe is a 52 y.o. female who returns for f/u.  She has a hx of fibromuscular dysplasia of the renal arteries, HTN, and possible coronary vasospasm versus microvascular angina. Patient was admitted to the hospital in [place] in 2009 with severe chest pain. At that time, she had a left heart cath showing no angiographic coronary disease but the catheter did induce RCA spasm, leading to concern that her chest pain might be coronary vasospasm. She also had HTN found to be probably related to fibromuscular dysplasia of the renal arteries. She had a stent placed in the right renal artery in 2009.    She was admitted twice in 1/13, the first time with a hypertensive urgency (she had been off her BP meds for a number of days). She was re-admitted several days later with orthostatic symptoms and probable rebound tachycardia (she had not picked up her beta blocker prescription after discharge). She was restarted on beta blocker and HCTZ was stopped. Carotids in 2/13 showed no significant stenosis and renal artery dopplers in 2/13 showed no significant stenosis.    She was admitted again in 7/13 with atypical chest pain. She had an ETT-myoview. This was a submaximal study but showed no ischemia or infarction, EF 77%. She was in the ER with chest pain in 10/13 but was sent home after cardiac enzymes and ECG were unremarkable. She was admitted again in 5/14 with atypical chest pain, ruled out for MI, and sent home.    She was seen by Dr. [name] 5/29 in f/u.  She continued to have atypical chest pain episodes (nonexertional) once or twice a week, sometimes severe. They are usually responsive to NTG. She had not tolerated  Imdur due to headache.  She was placed on Ranolazine.  LDL was very high and Lipitor was increased to 80 mg.  In the interim, she has had an MRI that demonstrates lumbar spine DDD and she has been referred to surgery.  F/u ECG with acceptable QTc interval.  The chart indicates she was experiencing some side effects and the recommendation was to hold Ranexa.    I saw her 11/30/12 in f/u.  Her hands and feet were swollen with the Ranexa and she had orange urine.  She stopped it and her symptoms resolved.  She still reported chest discomfort 1-2 times per week.  I added a PPI to her regimen for possible GERD as a cause of her CP.  She returned a week later for BP check with the RN and BP was elevated.  Dr. Shirlee Latch added Clonidine.  Patient had side effects and it was stopped.    She comes in today with complaints of weakness, nausea and vomiting. This has been ongoing for the last 2 days. She denies hematemesis. She has occasional water brash symptoms. She also notes some dizziness/lightheadedness. She denies syncope. She denies any further chest pain or shortness of breath. She denies fevers, chills, cough. She has problems with constipation. She denies diarrhea. She denies melena or hematochezia.  She tells me that she had  Orthostatic vital signs with her PCP recently. These were apparently normal.  Labs (10/11): LDL 136, HDL 44, TGs 159, creatinine 1.06, K  3.8  Labs (11/11): LDL 101, HDL 49, LFTs normal, K 4.8, creatinine 0.83  Labs (1/13): K 4.2, creatinine 1.16, urine metanephrines normal, PRA/aldosterone ratio normal, LDL 156, HDL 56  Labs (7/13): K 3.8, creatinine 0.96, TSH normal, LDL 98, HDL 60  Labs (9/13): LDL 166, TSH normal, K 3.7, creatinine 1.0  Labs (5/14): K 3.6, creatinine 1.16=>0.96, LDL 168, HDL 51, Hgb 14.1,  Labs (6/14):  K 4.5, Cr 1.0, Hgb 14.1 Labs (7/14):  ALT 22, LDL 179 (pt admits non-compliance with statin)  Wt Readings from Last 3 Encounters:  12/29/12 138 lb (62.596 kg)    12/01/12 141 lb (63.957 kg)  11/30/12 140 lb (63.504 kg)     Past Medical History:  1. Migraine headaches  2. Chest pain: No angiographic coronary disease on cath in 2009 in [place]. 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 Suspect coronary vasospasm versus microvascular obstruction. Lexiscan myoview (11/11): EF 79%, normal perfusion. ETT-myoview (9/13) with EF 77%, no ischemic or infarction but submaximal study. Headaches with Imdur.  3. HTN: Renal artery stenosis has played a role. Urinary metanephrines and plasma renin/aldosterone ratio normal.  4. 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 She had a renal artery stent in 2009. Renal artery dopplers (12/11) suggestive of fibromuscular dysplasia but no significant stenosis. CTA abdomen (1/13) with evidence for fibromuscular dysplasia, right renal artery stent appears patent. Renal artery dopplers (2/13) with FMD but no evidence for signficant stenosis.  5. Hyperlipidemia  6. Fibromyalgia  7. Echo (1/13) EF 55-60%, grade I diastolic dysfunction.  8. Hypothyroidism  9. Carotid dopplers (2/13): consistent with FMD but no significant stenosis.  10. Fibromuscular dysplasia.    Current Outpatient Prescriptions  Medication Sig Dispense Refill  . aspirin EC 81 MG tablet Take 81 mg by mouth daily.      Marland Kitchen atorvastatin (LIPITOR) 80 MG tablet Take 1 tablet (80 mg total) by mouth daily.  30 tablet  3  . chlorthalidone (HYGROTON) 25 MG tablet Take 25 mg by mouth daily.      . DULoxetine (CYMBALTA) 60 MG capsule Take 1 capsule (60 mg total) by mouth daily.  90 capsule  3  . ergocalciferol (VITAMIN D2) 50000 UNITS capsule Take 1 capsule (50,000 Units total) by mouth once a week. On monday  12 capsule  0  . fluticasone (FLONASE) 50 MCG/ACT nasal spray Place 2 sprays into the nose at bedtime.  16 g  6  . gabapentin (NEURONTIN) 600 MG tablet Take 1 tablet (600 mg total) by mouth 4 (four) times daily. Take one tab with breakfast and in the  afternoon and two tabs at bedtime  120 tablet  3  . labetalol (NORMODYNE) 200 MG tablet Take 4 tablets (800 mg total) by mouth 2 (two) times daily.  240 tablet  3  . levothyroxine (SYNTHROID, LEVOTHROID) 125 MCG tablet Take 1.5 tablets (187.5 mcg total) by mouth daily before breakfast.  45 tablet  6  . loratadine (CLARITIN) 10 MG tablet Take 1 tablet (10 mg total) by mouth daily.  30 tablet  11  . LORazepam (ATIVAN) 1 MG tablet Take 1 tablet (1 mg total) by mouth every 8 (eight) hours as needed for anxiety.  30 tablet  5  . lubiprostone (AMITIZA) 24 MCG capsule Take 1 capsule (24 mcg total) by mouth daily as needed. For irritable bowel syndrome w/ constipation  90 capsule  3  . NIFEdipine (PROCARDIA XL/ADALAT-CC) 90 MG 24 hr tablet Take 90 mg by mouth daily.       Marland Kitchen  nitroGLYCERIN (NITROSTAT) 0.4 MG SL tablet Place 0.4 mg under the tongue every 5 (five) minutes as needed. x3 doses as needed for chest pain      . ondansetron (ZOFRAN ODT) 8 MG disintegrating tablet Take 1 tablet (8 mg total) by mouth every 8 (eight) hours as needed for nausea.  20 tablet  0  . pantoprazole (PROTONIX) 40 MG tablet Take 1 tablet (40 mg total) by mouth daily.  30 tablet  2  . potassium chloride SA (K-DUR,KLOR-CON) 20 MEQ tablet Take 20 mEq by mouth daily.      Marland Kitchen rOPINIRole (REQUIP) 0.25 MG tablet Take 0.5 mg by mouth at bedtime. Start one tab daily, increase by one tab every 3-4 days until 2 mg daily.      . sodium chloride (OCEAN NASAL SPRAY) 0.65 % nasal spray Place 1 spray into the nose as needed for congestion.  30 mL  12  . spironolactone (ALDACTONE) 50 MG tablet Take 1 tablet (50 mg total) by mouth daily.  30 tablet  3  . triamcinolone cream (KENALOG) 0.1 % Apply topically 2 (two) times daily. Apply to groin  30 g  0  . zolpidem (AMBIEN) 5 MG tablet Take 1-2 tablets (5-10 mg total) by mouth at bedtime as needed. For sleep  30 tablet  2   No current facility-administered medications for this visit.    Allergies:     Allergies  Allergen Reactions  . Hydromorphone Hcl Other (See Comments)    Palpitations  . Prochlorperazine Edisylate Other (See Comments)    Seizure   . Sumatriptan Palpitations    Social History:  The patient  reports that she has never smoked. She has never used smokeless tobacco. She reports that she does not drink alcohol or use illicit drugs.   ROS:  Please see the history of present illness.    All other systems reviewed and negative.   PHYSICAL EXAM: VS:  BP 125/92  Pulse 84  Ht 5\' 4"  (1.626 m)  Wt 138 lb (62.596 kg)  BMI 23.68 kg/m2 Well nourished, well developed, in no acute distress HEENT: normal Neck: no JVD Cardiac:  normal S1, S2; RRR; no murmur Lungs:  clear to auscultation bilaterally, no wheezing, rhonchi or rales Abd: soft, nontender, no hepatomegaly Ext: no edema Skin: warm and dry Neuro:  CNs 2-12 intact, no focal abnormalities noted  EKG:   NSR, HR 84, normal axis   ASSESSMENT AND PLAN:  1. Chest Pain:  Fairly quiescent. Continue current therapy. 2. Hypertension:  Better control. Her diet has changed recently with her nausea and vomiting. I suspect her salt load has been limited in this setting. 3. Hyperlipidemia:   Continue Lipitor. 4. Nausea and Vomiting:  I suspect she has significant acid reflux disease along with IBS contributing to her symptoms.  I will check labs today: Basic metabolic panel, CBC, LFTs and lipase. Refer to gastroenterology. I will give her Zofran 4 mg every 8 hours when necessary, #10, no refills. 5. Dizziness: Likely related to recent medication adjustments for pain control. This may also be related to recent nausea and vomiting. Check labs as noted. Otherwise, follow up with PCP. 6. Disposition:  Follow up with Dr. Shirlee Latch in 3 months.  Signed, Tereso Newcomer, PA-C  01/04/2013 9:27 AM

## 2013-01-04 NOTE — Telephone Encounter (Signed)
lmom labs ok, no changes to be made 

## 2013-01-04 NOTE — Patient Instructions (Addendum)
You have been referred to [facility]  LABS TODAY; BMET, CBC W/DIFF, LIPASE  PLEASE FOLLOW UP WITH DR. Shirlee Latch IN 3 MONTHS  AN RX FOR ZOFRAN 4 MG HAS BEEN SENT IN TODAY FOR YOU # 10 WITH 0 REFILLS; FURTHER REFILLS WILL HAVE TO BE WITH GI

## 2013-01-04 NOTE — Telephone Encounter (Signed)
Message copied by Tarri Fuller on Thu Jan 04, 2013  4:05 PM ------      Message from: [place], [place] T      Created: Thu Jan 04, 2013  3:39 PM       Labs ok      Continue with current treatment plan.      Tereso Newcomer, PA-C        01/04/2013 3:39 PM ------

## 2013-01-22 ENCOUNTER — Other Ambulatory Visit: Payer: Self-pay | Admitting: *Deleted

## 2013-01-22 MED ORDER — ZOLPIDEM TARTRATE 5 MG PO TABS: 5.0000 mg | ORAL_TABLET | Freq: Every evening | ORAL | Status: DC | PRN

## 2013-01-22 NOTE — Telephone Encounter (Signed)
Please phone in [place] refill for patient.  The line was busy when I tried.

## 2013-01-22 NOTE — Telephone Encounter (Signed)
Message left for pharmacy.  Leanda Padmore,CMA

## 2013-01-23 ENCOUNTER — Emergency Department (HOSPITAL_COMMUNITY)
Admission: EM | Admit: 2013-01-23 | Discharge: 2013-01-23 | Disposition: A | Discharge status: Home / Self Care | Payer: Medicaid Other | Attending: Emergency Medicine | Admitting: Emergency Medicine

## 2013-01-23 ENCOUNTER — Emergency Department (HOSPITAL_COMMUNITY): Payer: Medicaid Other

## 2013-01-23 ENCOUNTER — Encounter (HOSPITAL_COMMUNITY): Payer: Self-pay | Admitting: Cardiology

## 2013-01-23 DIAGNOSIS — Z7982 Long term (current) use of aspirin: Secondary | ICD-10-CM | POA: Insufficient documentation

## 2013-01-23 DIAGNOSIS — F329 Major depressive disorder, single episode, unspecified: Secondary | ICD-10-CM | POA: Insufficient documentation

## 2013-01-23 DIAGNOSIS — R5381 Other malaise: Secondary | ICD-10-CM | POA: Insufficient documentation

## 2013-01-23 DIAGNOSIS — M549 Dorsalgia, unspecified: Secondary | ICD-10-CM | POA: Insufficient documentation

## 2013-01-23 DIAGNOSIS — Z8739 Personal history of other diseases of the musculoskeletal system and connective tissue: Secondary | ICD-10-CM | POA: Insufficient documentation

## 2013-01-23 DIAGNOSIS — E039 Hypothyroidism, unspecified: Secondary | ICD-10-CM | POA: Insufficient documentation

## 2013-01-23 DIAGNOSIS — R0989 Other specified symptoms and signs involving the circulatory and respiratory systems: Secondary | ICD-10-CM | POA: Insufficient documentation

## 2013-01-23 DIAGNOSIS — R000 Tachycardia, unspecified: Secondary | ICD-10-CM | POA: Insufficient documentation

## 2013-01-23 DIAGNOSIS — I10 Essential (primary) hypertension: Secondary | ICD-10-CM | POA: Insufficient documentation

## 2013-01-23 DIAGNOSIS — Z8673 Personal history of transient ischemic attack (TIA), and cerebral infarction without residual deficits: Secondary | ICD-10-CM | POA: Insufficient documentation

## 2013-01-23 DIAGNOSIS — F3289 Other specified depressive episodes: Secondary | ICD-10-CM | POA: Insufficient documentation

## 2013-01-23 DIAGNOSIS — R112 Nausea with vomiting, unspecified: Secondary | ICD-10-CM | POA: Insufficient documentation

## 2013-01-23 DIAGNOSIS — F411 Generalized anxiety disorder: Secondary | ICD-10-CM | POA: Insufficient documentation

## 2013-01-23 DIAGNOSIS — R079 Chest pain, unspecified: Secondary | ICD-10-CM | POA: Insufficient documentation

## 2013-01-23 DIAGNOSIS — Z8742 Personal history of other diseases of the female genital tract: Secondary | ICD-10-CM | POA: Insufficient documentation

## 2013-01-23 DIAGNOSIS — Z8669 Personal history of other diseases of the nervous system and sense organs: Secondary | ICD-10-CM | POA: Insufficient documentation

## 2013-01-23 DIAGNOSIS — G8929 Other chronic pain: Secondary | ICD-10-CM | POA: Insufficient documentation

## 2013-01-23 DIAGNOSIS — I252 Old myocardial infarction: Secondary | ICD-10-CM | POA: Insufficient documentation

## 2013-01-23 DIAGNOSIS — R0609 Other forms of dyspnea: Secondary | ICD-10-CM | POA: Insufficient documentation

## 2013-01-23 DIAGNOSIS — Z79899 Other long term (current) drug therapy: Secondary | ICD-10-CM | POA: Insufficient documentation

## 2013-01-23 DIAGNOSIS — Z8679 Personal history of other diseases of the circulatory system: Secondary | ICD-10-CM | POA: Insufficient documentation

## 2013-01-23 LAB — BASIC METABOLIC PANEL
BUN: 15 mg/dL (ref 6–23)
Chloride: 103 mEq/L (ref 96–112)
GFR calc Af Amer: 73 mL/min — ABNORMAL LOW (ref >90)
Potassium: 4.4 mEq/L (ref 3.5–5.1)
Sodium: 137 mEq/L (ref 135–145)

## 2013-01-23 LAB — CBC WITH DIFFERENTIAL/PLATELET
Basophils Relative: 0 % (ref 0–1)
Hemoglobin: 13.7 g/dL (ref 12.0–15.0)
MCHC: 36.9 g/dL — ABNORMAL HIGH (ref 30.0–36.0)
Monocytes Relative: 9 % (ref 3–12)
Neutro Abs: 5.5 10*3/uL (ref 1.7–7.7)
Neutrophils Relative %: 68 % (ref 43–77)
Platelets: 334 10*3/uL (ref 150–400)
RBC: 4.29 MIL/uL (ref 3.87–5.11)

## 2013-01-23 LAB — TROPONIN I: Troponin I: <0.3 ng/mL (ref <0.30)

## 2013-01-23 MED ORDER — ONDANSETRON HCL 4 MG/2ML IJ SOLN: 4.0000 mg | Freq: Once | INTRAMUSCULAR | Status: DC

## 2013-01-23 MED ORDER — NITROGLYCERIN 0.4 MG SL SUBL: 0.4000 mg | SUBLINGUAL_TABLET | SUBLINGUAL | Status: DC | PRN

## 2013-01-23 MED ORDER — NITROGLYCERIN 2 % TD OINT: 0.5000 [in_us] | TOPICAL_OINTMENT | Freq: Four times a day (QID) | TRANSDERMAL | Status: DC

## 2013-01-23 NOTE — ED Notes (Signed)
Pt to department via EMS- pt reports left sided chest pain, reports hx of the same. Reports that the pain is coming in waves, and similar to the pain she normally has but more intense. Given 324 asa and total of 5 nitro PTA. 4mg  zofran. Bp-150/110 Hr-110

## 2013-01-23 NOTE — ED Provider Notes (Signed)
I saw and evaluated the patient, reviewed the resident's note and I agree with the findings and plan. I reviewed and interpreted the EKG during the patient's evaluation in the ED and agree with the resident's interpretation.   Patient presents to emergency room with complaints of chest pain. Patient feels like it is a sharp stabbing sensation in her left chest.  The pain is similar to prior episodes of chest pain. In addition the stabbing sensation she has had a heaviness in her chest. She denies any leg swelling. She has no history of PE. She denies any fevers or coughing. She has had multiple previous episodes of chest pain. She is followed by [facility] cardiology service. Patient's had normal cardiac catheterizations but there has been a question of arterial spasm.   Will check labs,and CXR.  Plan on EKG. We'll discuss with her cardiologist following the test results.   Celene Kras, MD 01/23/13 [phone]

## 2013-01-23 NOTE — ED Provider Notes (Signed)
CSN: [id number]     Arrival date & time 01/23/13  1209 History     First MD Initiated Contact with Patient 01/23/13 1211     Chief Complaint  Patient presents with  . Chest Pain   (Consider location/radiation/quality/duration/timing/severity/associated sxs/prior Treatment) Patient is a 52 y.o. female presenting with chest pain.  Chest Pain Pain location:  L chest Pain quality: stabbing   Pain radiates to:  Does not radiate Pain radiates to the back: no   Pain severity:  Moderate Onset quality:  Gradual Duration:  6 hours Timing:  Intermittent Progression:  Waxing and waning Chronicity:  Recurrent (acute on chronic- symptoms similar to pt's chronic chest pain but more severe) Relieved by:  Nothing Worsened by:  Nothing tried Ineffective treatments:  Nitroglycerin and aspirin Associated symptoms: nausea and vomiting   Associated symptoms: no abdominal pain, no back pain, no cough, no diaphoresis, no dizziness, no fatigue, no fever, no headache, no numbness, no palpitations, no shortness of breath and no weakness   Risk factors: hypertension   Risk factors: no coronary artery disease and no prior DVT/PE     Past Medical History  Diagnosis Date  . Migraine headache   . Chest pain     a. No angiographic CAD by cath 2009 in [place]. There was catheter-induced RCA vasospasm versus microvascular obstruction. bEugenie Birks myoview 04/2010 - EF 79%, normal perfusion.;  c. ETT-Myoview 7/13: no ischemia, EF 77%, submax exercise  . Renal artery stenosis     a. Due to fibromuscular dysplasia. b. Renal stent 2009. c. Studies:  CTA abdomen (1/13) with evidence for fibromuscular dysplasia, right renal artery stent appears patent. Renal artery dopplers (2/13) with FMD but no evidence for signficant stenosis.   . Hypothyroidism   . Fibromuscular dysplasia 2009    a. Renal artery stenosis due to this. b. Carotid dopplers 07/2011 c/w FMD but no significant stenosis  . Tachycardia 2000   10/27/10-Holter -NSR with occ sinus tachy-rare PACs, no sig arrhythmia  . Hx of echocardiogram     a. Echo (1/13): EF 55-60%, grade 1 diastolic dysfunction  . HTN (hypertension) 1988    a. Likely related to RAS; urinary metanephrines and plasma renin/aldosterone ratio normal. b. H/o HTN urgency 06/2011 after being off BP meds for a number of days  . HLD (hyperlipidemia) 2000  . Myocardial infarction 2009  . Anxiety 2001  . Depression 2001  . Hypertensive crisis 06/29/2011  . Leiomyoma   . Adenomyosis   . CVA (cerebral vascular accident) 2006  . Fibromyalgia 1999  . Coronary vasospasm   . Chronic back pain   . Hearing loss of left ear     s/p CVA 2006   Past Surgical History  Procedure Laterality Date  . Renal artery stent  2009  . Spine surgery    . Back surgery  2010 and 2011    In [place].  11/19/2008 (possibly a laminectomy) and in May of 2011 she underwent a L2-L3 fusion.  . Robotic assisted lap vaginal hysterectomy  05/13/2008    [facility]. da Vinci hysterectomy  for myomatous uterus    Family History  Problem Relation Age of Onset  . Heart attack    . Stroke    . Hypertension Mother   . Asthma Mother   . Hyperlipidemia Mother   . Stroke Mother   . Hypertension Father   . Cancer Father     Prostate   . Heart attack Father   . Hyperlipidemia Father   .  Stroke Father   . Hypertension Brother     Died from a massive stroke in setting of severe HTN at 17 (had had MI at 65)  . Heart attack Brother   . Early death Brother 50    stroke   . Hyperlipidemia Brother   . Metabolic syndrome Sister   . Depression Sister   . Hypertension Sister   . Diabetes Maternal Aunt    History  Substance Use Topics  . Smoking status: Never Smoker   . Smokeless tobacco: Never Used  . Alcohol Use: No   OB History   Grav Para Term Preterm Abortions TAB SAB Ect Mult Living                 Review of Systems  Constitutional: Negative for fever, chills, diaphoresis and fatigue.  Respiratory:  Negative for cough, chest tightness, shortness of breath and wheezing.   Cardiovascular: Positive for chest pain. Negative for palpitations and leg swelling.  Gastrointestinal: Positive for nausea and vomiting. Negative for abdominal pain and abdominal distention.  Genitourinary: Negative for dysuria.  Musculoskeletal: Negative for back pain.  Neurological: Negative for dizziness, syncope, weakness, light-headedness, numbness and headaches.  All other systems reviewed and are negative.    Allergies  Hydromorphone hcl; Prochlorperazine edisylate; and Sumatriptan  Home Medications   Current Outpatient Rx  Name  Route  Sig  Dispense  Refill  . aspirin EC 81 MG tablet   Oral   Take 81 mg by mouth daily.         Marland Kitchen atorvastatin (LIPITOR) 80 MG tablet   Oral   Take 1 tablet (80 mg total) by mouth daily.   30 tablet   3   . chlorthalidone (HYGROTON) 25 MG tablet   Oral   Take 25 mg by mouth daily.         . DULoxetine (CYMBALTA) 60 MG capsule   Oral   Take 1 capsule (60 mg total) by mouth daily.   90 capsule   3   . ergocalciferol (VITAMIN D2) 50000 UNITS capsule   Oral   Take 1 capsule (50,000 Units total) by mouth once a week. On monday   12 capsule   0   . fluticasone (FLONASE) 50 MCG/ACT nasal spray   Nasal   Place 2 sprays into the nose at bedtime.   16 g   6   . gabapentin (NEURONTIN) 600 MG tablet   Oral   Take 600 mg by mouth 4 (four) times daily. Take two tabs at bedtime         . labetalol (NORMODYNE) 200 MG tablet   Oral   Take 4 tablets (800 mg total) by mouth 2 (two) times daily.   240 tablet   3   . levothyroxine (SYNTHROID, LEVOTHROID) 125 MCG tablet   Oral   Take 1.5 tablets (187.5 mcg total) by mouth daily before breakfast.   45 tablet   6   . loratadine (CLARITIN) 10 MG tablet   Oral   Take 1 tablet (10 mg total) by mouth daily.   30 tablet   11   . LORazepam (ATIVAN) 1 MG tablet   Oral   Take 1 tablet (1 mg total) by mouth  every 8 (eight) hours as needed for anxiety.   30 tablet   5   . lubiprostone (AMITIZA) 24 MCG capsule   Oral   Take 1 capsule (24 mcg total) by mouth daily as needed. For irritable bowel  syndrome w/ constipation   90 capsule   3   . NIFEdipine (PROCARDIA XL/ADALAT-CC) 90 MG 24 hr tablet   Oral   Take 90 mg by mouth daily.          . nitroGLYCERIN (NITROSTAT) 0.4 MG SL tablet   Sublingual   Place 0.4 mg under the tongue every 5 (five) minutes as needed. x3 doses as needed for chest pain         . ondansetron (ZOFRAN ODT) 8 MG disintegrating tablet   Oral   Take 1 tablet (8 mg total) by mouth every 8 (eight) hours as needed for nausea.   20 tablet   0   . ondansetron (ZOFRAN) 4 MG tablet   Oral   Take 1 tablet (4 mg total) by mouth every 8 (eight) hours as needed for nausea.   10 tablet   0   . pantoprazole (PROTONIX) 40 MG tablet   Oral   Take 1 tablet (40 mg total) by mouth daily.   30 tablet   2   . potassium chloride SA (K-DUR,KLOR-CON) 20 MEQ tablet   Oral   Take 20 mEq by mouth daily.         Marland Kitchen rOPINIRole (REQUIP) 0.25 MG tablet   Oral   Take 0.5 mg by mouth at bedtime. Start one tab daily, increase by one tab every 3-4 days until 2 mg daily.         . sodium chloride (OCEAN NASAL SPRAY) 0.65 % nasal spray   Nasal   Place 1 spray into the nose as needed for congestion.   30 mL   12   . spironolactone (ALDACTONE) 50 MG tablet   Oral   Take 1 tablet (50 mg total) by mouth daily.   30 tablet   3   . triamcinolone cream (KENALOG) 0.1 %   Topical   Apply topically 2 (two) times daily. Apply to groin   30 g   0   . zolpidem (AMBIEN) 5 MG tablet   Oral   Take 1-2 tablets (5-10 mg total) by mouth at bedtime as needed. For sleep   30 tablet   2    BP 147/105  Pulse 98  Temp(Src) 97.9 F (36.6 C) (Oral)  Resp 18  SpO2 100% Physical Exam  Nursing note and vitals reviewed. Constitutional: She is oriented to person, place, and time. She  appears well-developed and well-nourished. No distress.  HENT:  Head: Normocephalic and atraumatic.  Eyes: EOM are normal. Pupils are equal, round, and reactive to light.  Neck: Normal range of motion. Neck supple.  Cardiovascular: Normal rate, regular rhythm and intact distal pulses.  Exam reveals no gallop and no friction rub.   No murmur heard. Pulmonary/Chest: Effort normal and breath sounds normal. No respiratory distress. She has no wheezes. She has no rales.  Abdominal: Soft. Bowel sounds are normal. She exhibits no distension. There is no tenderness. There is no rebound and no guarding.  Musculoskeletal: Normal range of motion. She exhibits no edema and no tenderness.  Neurological: She is alert and oriented to person, place, and time. She exhibits normal muscle tone. Coordination normal.  Skin: Skin is warm and dry. She is not diaphoretic.    ED Course   Procedures (including critical care time)  Labs Reviewed  CBC WITH DIFFERENTIAL - Abnormal; Notable for the following:    MCHC 36.9 (*)    All other components within normal limits  BASIC METABOLIC PANEL -  Abnormal; Notable for the following:    GFR calc non Af Amer 63 (*)    GFR calc Af Amer 73 (*)    All other components within normal limits  TROPONIN I   Dg Chest 2 View  01/23/2013   *RADIOLOGY REPORT*  Clinical Data: Chest pain  CHEST - 2 VIEW  Comparison:  Oct 14, 2012  Findings:  Lungs clear.  Heart size and pulmonary vascularity are normal.  No adenopathy.  There is upper thoracic levoscoliosis.  IMPRESSION: No edema or consolidation.   Original Report Authenticated By: Bretta Bang, M.D.   1. Chest pain      Date: 01/23/2013  Rate: 111  Rhythm: normal sinus rhythm  QRS Axis: normal  Intervals: normal  ST/T Wave abnormalities: normal  Conduction Disutrbances:none  Narrative Interpretation:   Old EKG Reviewed: unchanged   MDM  52 y.o. F with a PMH of renal artery stenosis s/p renal artery stent, with  admission in 2009 for chest pain which showed clean coronaries although suspicion for coronary vasospasm, presenting with chest pain.  Pt reports sharp, stabbing, L anterior chest pain which began this morning.  She reports associated nausea and fatigue.  Pt took ASA and NTG at home and was given NTG by EMS en route (NTG x 5 tabs total) with mild improvement in symptoms.  Pt reports pain is similar to prior chest pain although more intense.  She reports mild dyspnea.  She denies pleuritic pain, LE pain or swelling, prior h/o VTE, or recent immobilizations.  On exam, pt anxious appearing, mildly tachycardic to 111, vital signs otherwise stable.  Physical exam with no acute abnormalities.  Pt has chronic chest pain thought by cardiology to be 2/2 coronary vasospasm and she reports these symptoms are similar, suspect this is exacerbation of chronic chest pain.  EKG obtained shows no acute ischemic changes.  Will check troponin, CXR, CBC, BMP.  History and exam not consistent with PE.  NTG and zofran administered.  EKG as above with no evidence of acute ischemia.  Troponin negative.  Other labs and CXR with no acute abnormalities.  Discussed results with pt who reports mild improvement in symptoms.  Offered to discuss with cardiology however pt states she feels well enough to go home and is refusing at this time.  Given pt's history with extensive cardiac workup, atypical chest pain, with no EKG changes and negative troponin, pt stable for discharge.  ED return precautions given.  Advised close f/u with pt's cardiologist.  Discussed with attending Dr. Lynelle Doctor.    Jodean Lima, MD 01/23/13 1534

## 2013-01-29 ENCOUNTER — Other Ambulatory Visit: Payer: Self-pay | Admitting: Family Medicine

## 2013-01-29 MED ORDER — CYCLOBENZAPRINE HCL 10 MG PO TABS: 10.0000 mg | ORAL_TABLET | Freq: Three times a day (TID) | ORAL | Status: DC | PRN

## 2013-02-02 ENCOUNTER — Ambulatory Visit (INDEPENDENT_AMBULATORY_CARE_PROVIDER_SITE_OTHER): Payer: Medicaid Other | Admitting: Internal Medicine

## 2013-02-02 ENCOUNTER — Encounter: Payer: Self-pay | Admitting: Internal Medicine

## 2013-02-02 VITALS — BP 110/80 | HR 88 | Ht 63.0 in | Wt 141.4 lb

## 2013-02-02 DIAGNOSIS — K5909 Other constipation: Secondary | ICD-10-CM

## 2013-02-02 DIAGNOSIS — R1032 Left lower quadrant pain: Secondary | ICD-10-CM

## 2013-02-02 DIAGNOSIS — K5900 Constipation, unspecified: Secondary | ICD-10-CM

## 2013-02-02 DIAGNOSIS — R198 Other specified symptoms and signs involving the digestive system and abdomen: Secondary | ICD-10-CM

## 2013-02-02 DIAGNOSIS — R194 Change in bowel habit: Secondary | ICD-10-CM

## 2013-02-02 MED ORDER — NA SULFATE-K SULFATE-MG SULF 17.5-3.13-1.6 GM/177ML PO SOLN: ORAL | Status: DC

## 2013-02-02 MED ORDER — LINACLOTIDE 290 MCG PO CAPS: 290.0000 ug | ORAL_CAPSULE | Freq: Every day | ORAL | Status: DC

## 2013-02-02 NOTE — Patient Instructions (Addendum)
You have been scheduled for a colonoscopy with propofol. Please follow written instructions given to you at your visit today.  Please pick up your prep kit at the pharmacy within the next 1-3 days. If you use inhalers (even only as needed), please bring them with you on the day of your procedure. Your physician has requested that you go to www.startemmi.com and enter the access code given to you at your visit today. This web site gives a general overview about your procedure. However, you should still follow specific instructions given to you by our office regarding your preparation for the procedure.  Stop your Amitiza.  Today we are giving you samples of Linzess .  Take one daily before breakfast  To help with constipation,.   I appreciate the opportunity to care for you.

## 2013-02-02 NOTE — Progress Notes (Signed)
Subjective:    Patient ID: [name] Rowe, female    DOB: 11-Sep-1960, 52 y.o.   MRN: [id number]  HPI The patient is here, referred by cardiology for evaluation of nausea and vomiting and abdominal pain. She has many years of chronic constipation, with prior colonoscopy 5+ years ago in [place] . She has tried lactulose, MiraLax, Amitiza, without success. Fiber has not helped either. She has lack of urge to defecate with progressive bloating and abdominal distention and when she does defecate it is incomplete. No bleeding. Some heartburn. Nausea and vomiting has resolved.  Allergies  Allergen Reactions  . Hydromorphone Hcl Other (See Comments)    Palpitations  . Prochlorperazine Edisylate Other (See Comments)    Seizure   . Sumatriptan Palpitations   Outpatient Prescriptions Prior to Visit  Medication Sig Dispense Refill  . aspirin EC 81 MG tablet Take 81 mg by mouth daily.      Marland Kitchen atorvastatin (LIPITOR) 80 MG tablet Take 1 tablet (80 mg total) by mouth daily.  30 tablet  3  . chlorthalidone (HYGROTON) 25 MG tablet Take 25 mg by mouth daily.      . cyclobenzaprine (FLEXERIL) 10 MG tablet Take 1 tablet (10 mg total) by mouth 3 (three) times daily as needed for muscle spasms.  30 tablet  0  . DULoxetine (CYMBALTA) 60 MG capsule Take 1 capsule (60 mg total) by mouth daily.  90 capsule  3  . ergocalciferol (VITAMIN D2) 50000 UNITS capsule Take 1 capsule (50,000 Units total) by mouth once a week. On monday  12 capsule  0  . fluticasone (FLONASE) 50 MCG/ACT nasal spray Place 2 sprays into the nose at bedtime.  16 g  6  . gabapentin (NEURONTIN) 600 MG tablet Take 600 mg by mouth 4 (four) times daily. Take two tabs at bedtime      . labetalol (NORMODYNE) 200 MG tablet Take 4 tablets (800 mg total) by mouth 2 (two) times daily.  240 tablet  3  . levothyroxine (SYNTHROID, LEVOTHROID) 125 MCG tablet Take 1.5 tablets (187.5 mcg total) by mouth daily before breakfast.  45 tablet  6  . loratadine  (CLARITIN) 10 MG tablet Take 1 tablet (10 mg total) by mouth daily.  30 tablet  11  . LORazepam (ATIVAN) 1 MG tablet Take 1 tablet (1 mg total) by mouth every 8 (eight) hours as needed for anxiety.  30 tablet  5  . NIFEdipine (PROCARDIA XL/ADALAT-CC) 90 MG 24 hr tablet Take 90 mg by mouth daily.       . nitroGLYCERIN (NITROSTAT) 0.4 MG SL tablet Place 0.4 mg under the tongue every 5 (five) minutes as needed. x3 doses as needed for chest pain      . ondansetron (ZOFRAN ODT) 8 MG disintegrating tablet Take 1 tablet (8 mg total) by mouth every 8 (eight) hours as needed for nausea.  20 tablet  0  . ondansetron (ZOFRAN) 4 MG tablet Take 1 tablet (4 mg total) by mouth every 8 (eight) hours as needed for nausea.  10 tablet  0  . pantoprazole (PROTONIX) 40 MG tablet Take 1 tablet (40 mg total) by mouth daily.  30 tablet  2  . potassium chloride SA (K-DUR,KLOR-CON) 20 MEQ tablet Take 20 mEq by mouth daily.      Marland Kitchen rOPINIRole (REQUIP) 0.25 MG tablet Take 0.5 mg by mouth at bedtime. Start one tab daily, increase by one tab every 3-4 days until 2 mg daily.      Marland Kitchen  sodium chloride (OCEAN NASAL SPRAY) 0.65 % nasal spray Place 1 spray into the nose as needed for congestion.  30 mL  12  . spironolactone (ALDACTONE) 50 MG tablet Take 1 tablet (50 mg total) by mouth daily.  30 tablet  3  . triamcinolone cream (KENALOG) 0.1 % Apply topically 2 (two) times daily. Apply to groin  30 g  0  . zolpidem (AMBIEN) 5 MG tablet Take 1-2 tablets (5-10 mg total) by mouth at bedtime as needed. For sleep  30 tablet  2  . lubiprostone (AMITIZA) 24 MCG capsule Take 1 capsule (24 mcg total) by mouth daily as needed. For irritable bowel syndrome w/ constipation  90 capsule  3   No facility-administered medications prior to visit.   Past Medical History  Diagnosis Date  . Migraine headache   . Chest pain     a. No angiographic CAD by cath 2009 in [place]. There was catheter-induced RCA vasospasm versus microvascular obstruction.  bEugenie Birks myoview 04/2010 - EF 79%, normal perfusion.;  c. ETT-Myoview 7/13: no ischemia, EF 77%, submax exercise  . Renal artery stenosis     a. Due to fibromuscular dysplasia. b. Renal stent 2009. c. Studies:  CTA abdomen (1/13) with evidence for fibromuscular dysplasia, right renal artery stent appears patent. Renal artery dopplers (2/13) with FMD but no evidence for signficant stenosis.   . Hypothyroidism   . Fibromuscular dysplasia 2009    a. Renal artery stenosis due to this. b. Carotid dopplers 07/2011 c/w FMD but no significant stenosis  . Tachycardia 2000    10/27/10-Holter -NSR with occ sinus tachy-rare PACs, no sig arrhythmia  . Hx of echocardiogram     a. Echo (1/13): EF 55-60%, grade 1 diastolic dysfunction  . HTN (hypertension) 1988    a. Likely related to RAS; urinary metanephrines and plasma renin/aldosterone ratio normal. b. H/o HTN urgency 06/2011 after being off BP meds for a number of days  . HLD (hyperlipidemia) 2000  . Myocardial infarction 2009  . Anxiety 2001  . Depression 2001  . Hypertensive crisis 06/29/2011  . Leiomyoma   . Adenomyosis   . CVA (cerebral vascular accident) 2006  . Fibromyalgia 1999  . Coronary vasospasm   . Chronic back pain   . Hearing loss of left ear     s/p CVA 2006   Past Surgical History  Procedure Laterality Date  . Renal artery stent  2009  . Spine surgery    . Back surgery  2010 and 2011    In CT.  11/19/2008 (possibly a laminectomy) and in May of 2011 she underwent a L2-L3 fusion.  . Robotic assisted lap vaginal hysterectomy  05/13/2008    [facility]. da Vinci hysterectomy  for myomatous uterus   . Colonoscopy     History   Social History  . Marital Status: Married    Spouse Name: N/A    Number of Children: 2  . Years of Education: N/A   Occupational History  . worked in a Museum/gallery conservator.     Quit about a yr ago because fo work related injury to left knee   Social History Main Topics  . Smoking status: Never Smoker   .  Smokeless tobacco: Never Used  . Alcohol Use: No  . Drug Use: No  . Sexual Activity: Not Currently   Other Topics Concern  . None   Social History Narrative   Husband is currently in jail for an offense committed in [place]  and he is awaiting extradition.   Lives alone.    Jehovah's witness.    Walks 20-30 minutes.             Family History  Problem Relation Age of Onset  . Heart attack    . Stroke    . Hypertension Mother   . Asthma Mother   . Hyperlipidemia Mother   . Stroke Mother   . Hypertension Father   . Cancer Father     Prostate   . Heart attack Father   . Hyperlipidemia Father   . Stroke Father   . Hypertension Brother     Died from a massive stroke in setting of severe HTN at 78 (had had MI at 22)  . Heart attack Brother   . Early death Brother 76    stroke   . Hyperlipidemia Brother   . Metabolic syndrome Sister   . Depression Sister   . Hypertension Sister   . Diabetes Maternal Aunt      Review of Systems + Chronic insomnia, back pain (s/p surgeries) All other ROS negative or as per HPI    Objective:   Physical Exam General:  Well-developed, well-nourished and in no acute distress Eyes:  anicteric. ENT:   Mouth and posterior pharynx free of lesions.  Neck:   supple w/o thyromegaly or mass.  Lungs: Clear to auscultation bilaterally. Heart:  S1S2, no rubs, murmurs, gallops. Abdomen:  soft,  Diffusely tender, worst in epigastrium, ? Palpable transverse colon, no hepatosplenomegaly, hernia, or mass and BS+.  Rectal: Female staff present - small anterior tag o/w normal anoderm. Absent anal wink. Small rectocele, normal resting tone, brown formed stool, no mass. Good voluntary squeeze and appropriate abdominal contraction with descent when simulating defecation Lymph:  no cervical or supraclavicular adenopathy. Extremities:   no edema Skin   no rash. Neuro:  A&O x 3.  Psych:  appropriate mood and  Affect.   Data Reviewed: Lab Results   Component Value Date   WBC 8.0 01/23/2013   HGB 13.7 01/23/2013   HCT 37.1 01/23/2013   MCV 86.5 01/23/2013   PLT 334 01/23/2013   Lab Results  Component Value Date   TSH 1.658 10/15/2012      Assessment & Plan:  Change in bowel habits - worsening constipation  Chronic constipation  LLQ pain  Background of chronic pain and stress  1. Trial of Linzess 290 mcg daily - samples 2. Colonoscopy to evaluate changes, worsening constipation The risks and benefits as well as alternatives of endoscopic procedure(s) have been discussed and reviewed. All questions answered. The patient agrees to proceed. 3. May need CT  RU:[id number], Ellison Carwin, MD Tereso Newcomer, PA-C

## 2013-02-03 ENCOUNTER — Encounter: Payer: Self-pay | Admitting: Internal Medicine

## 2013-02-13 ENCOUNTER — Ambulatory Visit (INDEPENDENT_AMBULATORY_CARE_PROVIDER_SITE_OTHER): Payer: Medicaid Other | Admitting: Physician Assistant

## 2013-02-13 ENCOUNTER — Encounter: Payer: Self-pay | Admitting: Physician Assistant

## 2013-02-13 VITALS — BP 124/82 | HR 114 | Ht 64.0 in | Wt 134.0 lb

## 2013-02-13 DIAGNOSIS — I10 Essential (primary) hypertension: Secondary | ICD-10-CM

## 2013-02-13 DIAGNOSIS — E785 Hyperlipidemia, unspecified: Secondary | ICD-10-CM

## 2013-02-13 DIAGNOSIS — R112 Nausea with vomiting, unspecified: Secondary | ICD-10-CM

## 2013-02-13 DIAGNOSIS — R079 Chest pain, unspecified: Secondary | ICD-10-CM

## 2013-02-13 DIAGNOSIS — F3289 Other specified depressive episodes: Secondary | ICD-10-CM

## 2013-02-13 DIAGNOSIS — R000 Tachycardia, unspecified: Secondary | ICD-10-CM

## 2013-02-13 DIAGNOSIS — I498 Other specified cardiac arrhythmias: Secondary | ICD-10-CM

## 2013-02-13 DIAGNOSIS — F329 Major depressive disorder, single episode, unspecified: Secondary | ICD-10-CM

## 2013-02-13 NOTE — Progress Notes (Signed)
[address] [place], [place]  [address] Phone: [phone] Fax:  [phone]  Date:  02/13/2013   ID:  Aquilla Solian, DOB 10-29-1960, MRN [id number]  PCP:  Lora Paula, MD  Cardiologist:  Dr. [name]     History of Present Illness: [name] Rowe is a 52 y.o. female who returns for f/u after a recent trip to the ED with CP.  She has a hx of fibromuscular dysplasia of the renal arteries, HTN, and possible coronary vasospasm versus microvascular angina. Patient was admitted to the hospital in [place] in 2009 with severe chest pain. At that time, she had a left heart cath showing no angiographic coronary disease but the catheter did induce RCA spasm, leading to concern that her chest pain might be coronary vasospasm. She also had HTN found to be probably related to fibromuscular dysplasia of the renal arteries. She had a stent placed in the right renal artery in 2009.    She was admitted twice in 1/13, the first time with a hypertensive urgency (she had been off her BP meds for a number of days). She was re-admitted several days later with orthostatic symptoms and probable rebound tachycardia (she had not picked up her beta blocker prescription after discharge). She was restarted on beta blocker and HCTZ was stopped. Carotids in 2/13 showed no significant stenosis and renal artery dopplers in 2/13 showed no significant stenosis.    She was admitted again in 7/13 with atypical chest pain. She had an ETT-myoview. This was a submaximal study but showed no ischemia or infarction, EF 77%. She was in the ER with chest pain in 10/13 but was sent home after cardiac enzymes and ECG were unremarkable. She was admitted again in 5/14 with atypical chest pain, ruled out for MI, and sent home.    She was seen by Dr. [name] 5/29 in f/u.  She continued to have atypical chest pain episodes (nonexertional) once or twice a week, sometimes severe. They are usually  responsive to NTG. She had not tolerated Imdur due to headache.  She was placed on Ranolazine.  LDL was very high and Lipitor was increased to 80 mg.  In the interim, she has had an MRI that demonstrates lumbar spine DDD and she has been referred to surgery.  F/u ECG with acceptable QTc interval.  The chart indicates she was experiencing some side effects and the recommendation was to hold Ranexa.    I saw her 11/30/12 in f/u.  Her hands and feet were swollen with the Ranexa and she had orange urine.  She stopped it and her symptoms resolved.  She still reported chest discomfort 1-2 times per week.  I added a PPI to her regimen for possible GERD as a cause of her CP.  She returned a week later for BP check with the RN and BP was elevated.  Dr. Shirlee Latch added Clonidine.  Patient had side effects and it was stopped.    When I last saw her in 12/2012 she c/o significant N/V and was referred to GI.  She is to be set up for a colonoscopy.  She was seen in the emergency room 01/23/13. She took several doses of nitroglycerin with some improvement.  Cardiac markers remained normal.  ECG was reportedly without acute changes.    She tells me that she is suffering through a custody battle with family right now.  She is under a tremendous amount of stress.  She is  not sleeping well.  She is not eating.  She continues to have episodes of CP.  This is with emotional stress.  No exertional CP.  No DOE.  No syncope.  No orthopnea, edema.    Labs (10/11): LDL 136, HDL 44, TGs 159, creatinine 1.06, K 3.8  Labs (11/11): LDL 101, HDL 49, LFTs normal, K 4.8, creatinine 0.83  Labs (1/13): K 4.2, creatinine 1.16, urine metanephrines normal, PRA/aldosterone ratio normal, LDL 156, HDL 56  Labs (7/13): K 3.8, creatinine 0.96, TSH normal, LDL 98, HDL 60  Labs (9/13): LDL 166, TSH normal, K 3.7, creatinine 1.0  Labs (5/14): K 3.6, creatinine 1.16=>0.96, LDL 168, HDL 51, Hgb 14.1,  Labs (6/14):  K 4.5, Cr 1.0, Hgb 14.1 Labs (7/14):  K  3.8, Cr 1.1, ALT 22, LDL 179 (pt admits non-compliance with statin) Labs (8/14):  K 4.4, Cr 1.02, Hgb 13.7  Wt Readings from Last 3 Encounters:  02/13/13 134 lb (60.782 kg)  02/02/13 141 lb 6 oz (64.127 kg)  01/04/13 138 lb (62.596 kg)     Past Medical History:  1. Migraine headaches  2. Chest pain: No angiographic coronary disease on cath in 2009 in [place]. 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 Suspect coronary vasospasm versus microvascular obstruction. Lexiscan myoview (11/11): EF 79%, normal perfusion. ETT-myoview (9/13) with EF 77%, no ischemic or infarction but submaximal study. Headaches with Imdur.  3. HTN: Renal artery stenosis has played a role. Urinary metanephrines and plasma renin/aldosterone ratio normal.  4. 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 She had a renal artery stent in 2009. Renal artery dopplers (12/11) suggestive of fibromuscular dysplasia but no significant stenosis. CTA abdomen (1/13) with evidence for fibromuscular dysplasia, right renal artery stent appears patent. Renal artery dopplers (2/13) with FMD but no evidence for signficant stenosis.  5. Hyperlipidemia  6. Fibromyalgia  7. Echo (1/13) EF 55-60%, grade I diastolic dysfunction.  8. Hypothyroidism  9. Carotid dopplers (2/13): consistent with FMD but no significant stenosis.  10. Fibromuscular dysplasia.    Current Outpatient Prescriptions  Medication Sig Dispense Refill  . aspirin EC 81 MG tablet Take 81 mg by mouth daily.      Marland Kitchen atorvastatin (LIPITOR) 80 MG tablet Take 1 tablet (80 mg total) by mouth daily.  30 tablet  3  . chlorthalidone (HYGROTON) 25 MG tablet Take 25 mg by mouth daily.      . cyclobenzaprine (FLEXERIL) 10 MG tablet Take 1 tablet (10 mg total) by mouth 3 (three) times daily as needed for muscle spasms.  30 tablet  0  . DULoxetine (CYMBALTA) 60 MG capsule Take 1 capsule (60 mg total) by mouth daily.  90 capsule  3  . ergocalciferol (VITAMIN D2) 50000 UNITS  capsule Take 1 capsule (50,000 Units total) by mouth once a week. On monday  12 capsule  0  . fluticasone (FLONASE) 50 MCG/ACT nasal spray Place 2 sprays into the nose at bedtime.  16 g  6  . gabapentin (NEURONTIN) 600 MG tablet Take 1,200 mg by mouth at bedtime. Take two tabs at bedtime      . labetalol (NORMODYNE) 200 MG tablet Take 4 tablets (800 mg total) by mouth 2 (two) times daily.  240 tablet  3  . levothyroxine (SYNTHROID, LEVOTHROID) 125 MCG tablet Take 1.5 tablets (187.5 mcg total) by mouth daily before breakfast.  45 tablet  6  . Linaclotide (LINZESS) 290 MCG CAPS capsule Take 1 capsule (290 mcg total) by mouth daily.  32 capsule  0  . loratadine (CLARITIN) 10 MG tablet Take 10 mg by mouth as needed.      Marland Kitchen LORazepam (ATIVAN) 1 MG tablet Take 1 tablet (1 mg total) by mouth every 8 (eight) hours as needed for anxiety.  30 tablet  5  . Na Sulfate-K Sulfate-Mg Sulf (SUPREP BOWEL PREP) SOLN Use as directed  2 Bottle  0  . NIFEdipine (PROCARDIA XL/ADALAT-CC) 90 MG 24 hr tablet Take 90 mg by mouth daily.       . nitroGLYCERIN (NITROSTAT) 0.4 MG SL tablet Place 0.4 mg under the tongue every 5 (five) minutes as needed. x3 doses as needed for chest pain      . ondansetron (ZOFRAN ODT) 8 MG disintegrating tablet Take 1 tablet (8 mg total) by mouth every 8 (eight) hours as needed for nausea.  20 tablet  0  . pantoprazole (PROTONIX) 40 MG tablet Take 1 tablet (40 mg total) by mouth daily.  30 tablet  2  . potassium chloride SA (K-DUR,KLOR-CON) 20 MEQ tablet Take 20 mEq by mouth daily.      Marland Kitchen rOPINIRole (REQUIP) 0.25 MG tablet Take 0.5 mg by mouth at bedtime. Start one tab daily, increase by one tab every 3-4 days until 2 mg daily.      . sodium chloride (OCEAN NASAL SPRAY) 0.65 % nasal spray Place 1 spray into the nose as needed for congestion.  30 mL  12  . spironolactone (ALDACTONE) 50 MG tablet Take 1 tablet (50 mg total) by mouth daily.  30 tablet  3  . triamcinolone cream (KENALOG) 0.1 % Apply  topically 2 (two) times daily. Apply to groin  30 g  0  . zolpidem (AMBIEN) 5 MG tablet Take 1-2 tablets (5-10 mg total) by mouth at bedtime as needed. For sleep  30 tablet  2   No current facility-administered medications for this visit.    Allergies:    Allergies  Allergen Reactions  . Hydromorphone Hcl Other (See Comments)    Palpitations  . Prochlorperazine Edisylate Other (See Comments)    Seizure   . Sumatriptan Palpitations    Social History:  The patient  reports that she has never smoked. She has never used smokeless tobacco. She reports that she does not drink alcohol or use illicit drugs.   ROS:  Please see the history of present illness.  She has chronic back pain, frequent HAs.  She had recent "cramping" in her feet and hands.  No TIA symptoms or AFugax.   She denies HI/SI.  All other systems reviewed and negative.   PHYSICAL EXAM: VS:  BP 124/82  Pulse 114  Ht 5\' 4"  (1.626 m)  Wt 134 lb (60.782 kg)  BMI 22.99 kg/m2 Well nourished, well developed, in no acute distress HEENT: normal Neck: no JVD Vascular:  No carotid bruits Cardiac:  normal S1, S2; RRR; no murmur Lungs:  clear to auscultation bilaterally, no wheezing, rhonchi or rales Abd: soft, nontender, no hepatomegaly Ext: no edema Skin: warm and dry Neuro:  CNs 2-12 intact, no focal abnormalities noted  EKG:   Sinus Tach, HR 114, no changes   ASSESSMENT AND PLAN:  1. Chest Pain:  Atypical.  I suspect this and many of her other symptoms are related to increased stress in the setting of depression and anxiety.  No further cardiac workup.  If symptoms persist, could consider proceeding with stress testing as it has been some time since this was done. 2. Depression:  I have recommended early follow up with her PCP for management.  We will try to arrange follow up for her. 3. Hypertension:  Controlled.  Continue current therapy.  4. Sinus Tachycardia:  I suspect this is from anxiety.  Monitor for  now. 5. Hyperlipidemia:   Continue Lipitor. 6. Nausea and Vomiting:  Continue f/u with GI as planned. She is set for a colo this week.  7. Disposition:  Follow up with Dr. Shirlee Latch in October as planned.  Signed, Tereso Newcomer, PA-C  02/13/2013 2:54 PM

## 2013-02-13 NOTE — Patient Instructions (Addendum)
Your physician recommends that you continue on your current medications as directed. Please refer to the Current Medication list given to you today.  Your physician recommends that you keep your scheduled follow-up appointment with Dr.Mclean 04/03/13 @ 8:30 am  We have called and scheduled an appointment with your PCP Dr.Funches on Monday 01/19/13 @9 :15 there phone number [phone]

## 2013-02-16 ENCOUNTER — Encounter: Payer: Self-pay | Admitting: Internal Medicine

## 2013-02-16 ENCOUNTER — Ambulatory Visit (AMBULATORY_SURGERY_CENTER): Payer: Medicaid Other | Admitting: Internal Medicine

## 2013-02-16 VITALS — BP 118/80 | HR 88 | Temp 96.9°F | Resp 13 | Ht 63.0 in | Wt 141.0 lb

## 2013-02-16 DIAGNOSIS — R198 Other specified symptoms and signs involving the digestive system and abdomen: Secondary | ICD-10-CM

## 2013-02-16 DIAGNOSIS — D126 Benign neoplasm of colon, unspecified: Secondary | ICD-10-CM

## 2013-02-16 DIAGNOSIS — R194 Change in bowel habit: Secondary | ICD-10-CM

## 2013-02-16 MED ORDER — SODIUM CHLORIDE 0.9 % IV SOLN: 500.0000 mL | INTRAVENOUS | Status: DC

## 2013-02-16 NOTE — Progress Notes (Signed)
Called to room to assist during endoscopic procedure.  Patient ID and intended procedure confirmed with present staff. Received instructions for my participation in the procedure from the performing physician.  

## 2013-02-16 NOTE — Patient Instructions (Addendum)
I found and removed a tiny polyp - it looks benign - do not worry about this. Otherwise the colon looks ok - some staining of the lining called melanosis that is not a problem. It came from prior laxatives.  I hope the Linzess is helping the constipation- I am going to give you some more samples.  Please call the office next week and make an appointment to see me in October.  I will let you know pathology results and when to have another routine colonoscopy by mail.  I appreciate the opportunity to care for you. Iva Boop, MD, FACG   YOU HAD AN ENDOSCOPIC PROCEDURE TODAY AT THE [facility]: Refer to the procedure report that was given to you for any specific questions about what was found during the examination.  If the procedure report does not answer your questions, please call your gastroenterologist to clarify.  If you requested that your care partner not be given the details of your procedure findings, then the procedure report has been included in a sealed envelope for you to review at your convenience later.  YOU SHOULD EXPECT: Some feelings of bloating in the abdomen. Passage of more gas than usual.  Walking can help get rid of the air that was put into your GI tract during the procedure and reduce the bloating. If you had a lower endoscopy (such as a colonoscopy or flexible sigmoidoscopy) you may notice spotting of blood in your stool or on the toilet paper. If you underwent a bowel prep for your procedure, then you may not have a normal bowel movement for a few days.  DIET: Your first meal following the procedure should be a light meal and then it is ok to progress to your normal diet.  A half-sandwich or bowl of soup is an example of a good first meal.  Heavy or fried foods are harder to digest and may make you feel nauseous or bloated.  Likewise meals heavy in dairy and vegetables can cause extra gas to form and this can also increase the bloating.  Drink plenty of  fluids but you should avoid alcoholic beverages for 24 hours.  ACTIVITY: Your care partner should take you home directly after the procedure.  You should plan to take it easy, moving slowly for the rest of the day.  You can resume normal activity the day after the procedure however you should NOT DRIVE or use heavy machinery for 24 hours (because of the sedation medicines used during the test).    SYMPTOMS TO REPORT IMMEDIATELY: A gastroenterologist can be reached at any hour.  During normal business hours, 8:30 AM to 5:00 PM Monday through Friday, call [phone].  After hours and on weekends, please call the GI answering service at [phone] who will take a message and have the physician on call contact you.   Following lower endoscopy (colonoscopy or flexible sigmoidoscopy):  Excessive amounts of blood in the stool  Significant tenderness or worsening of abdominal pains  Swelling of the abdomen that is new, acute  Fever of 100F or higher    FOLLOW UP: If any biopsies were taken you will be contacted by phone or by letter within the next 1-3 weeks.  Call your gastroenterologist if you have not heard about the biopsies in 3 weeks.  Our staff will call the home number listed on your records the next business day following your procedure to check on you and address any questions or concerns  that you may have at that time regarding the information given to you following your procedure. This is a courtesy call and so if there is no answer at the home number and we have not heard from you through the emergency physician on call, we will assume that you have returned to your regular daily activities without incident.  SIGNATURES/CONFIDENTIALITY: You and/or your care partner have signed paperwork which will be entered into your electronic medical record.  These signatures attest to the fact that that the information above on your After Visit Summary has been reviewed and is understood.  Full  responsibility of the confidentiality of this discharge information lies with you and/or your care-partner.   Information on polyps given to you today  Schedule office visit with Dr. Leone Payor for October

## 2013-02-16 NOTE — Progress Notes (Signed)
Patient did not experience any of the following events: a burn prior to discharge; a fall within the facility; wrong site/side/patient/procedure/implant event; or a hospital transfer or hospital admission upon discharge from the facility. (G8907) Patient did not have preoperative order for IV antibiotic SSI prophylaxis. (G8918)  

## 2013-02-16 NOTE — Progress Notes (Signed)
Reportr to pacu rn, vss, bbs=clear 

## 2013-02-16 NOTE — Op Note (Signed)
[facility] [address] [place], [address]   COLONOSCOPY PROCEDURE REPORT  PATIENT: [name] Rowe, [name] Rowe  MR#: [id number] BIRTHDATE: [birth year] , 52  yrs. old GENDER: Female ENDOSCOPIST: Iva Boop, MD, [facility] REFERRED XB:JYNWG Alben Spittle, P.A.-C PROCEDURE DATE:  02/16/2013 PROCEDURE:   Colonoscopy with snare polypectomy First Screening Colonoscopy - Avg.  risk and is 50 yrs.  old or older - No.  Prior Negative Screening - Now for repeat screening. N/A  History of Adenoma - Now for follow-up colonoscopy & has been > or = to 3 yrs.  N/A  Polyps Removed Today? Yes. ASA CLASS:   Class II INDICATIONS:Change in bowel habits. MEDICATIONS: Propofol (Diprivan) 180 mg IV  DESCRIPTION OF PROCEDURE:   After the risks benefits and alternatives of the procedure were thoroughly explained, informed consent was obtained.  A digital rectal exam revealed no abnormalities of the rectum.   The LB [id number]  endoscope was introduced through the anus and advanced to the cecum, which was identified by both the appendix and ileocecal valve. No adverse events experienced.   The quality of the prep was excellent using Suprep  The instrument was then slowly withdrawn as the colon was fully examined.   COLON FINDINGS: A diminutive sessile polyp was found in the ascending colon.  A polypectomy was performed with a cold snare. The resection was complete and the polyp tissue was completely retrieved.   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   The colon mucosa was otherwise normal though the colon was redundant.  A right colon retroflexion was performed. Retroflexed views revealed no abnormalities. The time to cecum=1 minutes 48 seconds.  Withdrawal time=8 minutes 43 seconds.  The scope was withdrawn and the procedure completed. COMPLICATIONS: There were no complications.  ENDOSCOPIC IMPRESSION: 1.   Diminutive sessile polyp was found in the ascending  colon; polypectomy was performed with a cold snare 2.   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   The colon mucosa was otherwise normal except it was redundant.  RECOMMENDATIONS: 1.  Timing of repeat colonoscopy will be determined by pathology findings. 2.   Schedule REV for October (patient to call)   eSigned:  Iva Boop, MD, [facility] 02/16/2013 4:18 PM   cc: Tereso Newcomer PA and The Patient

## 2013-02-16 NOTE — Progress Notes (Signed)
Patient denies any allergies to eggs or soy. Patient denies any problems with anesthesia.  

## 2013-02-19 ENCOUNTER — Ambulatory Visit (INDEPENDENT_AMBULATORY_CARE_PROVIDER_SITE_OTHER): Payer: Medicaid Other | Admitting: Family Medicine

## 2013-02-19 ENCOUNTER — Telehealth: Payer: Self-pay | Admitting: *Deleted

## 2013-02-19 ENCOUNTER — Encounter: Payer: Self-pay | Admitting: Family Medicine

## 2013-02-19 VITALS — BP 130/80 | HR 78 | Temp 98.4°F | Ht 63.0 in | Wt 131.0 lb

## 2013-02-19 DIAGNOSIS — E039 Hypothyroidism, unspecified: Secondary | ICD-10-CM

## 2013-02-19 DIAGNOSIS — F418 Other specified anxiety disorders: Secondary | ICD-10-CM

## 2013-02-19 DIAGNOSIS — F3289 Other specified depressive episodes: Secondary | ICD-10-CM

## 2013-02-19 DIAGNOSIS — F329 Major depressive disorder, single episode, unspecified: Secondary | ICD-10-CM

## 2013-02-19 DIAGNOSIS — F411 Generalized anxiety disorder: Secondary | ICD-10-CM

## 2013-02-19 MED ORDER — LORAZEPAM 1 MG PO TABS: ORAL_TABLET | ORAL | Status: DC

## 2013-02-19 NOTE — Telephone Encounter (Signed)
  Follow up Call-  Call back number 02/16/2013  Post procedure Call Back phone  # [phone]  Permission to leave phone message Yes     Phone number "does not accept incoming calls", unable to leave message also with this #.

## 2013-02-19 NOTE — Assessment & Plan Note (Addendum)
A: worsening anxiety and depression in the setting of stressors. Poor coping.  P: Please continue ativan only. 1 mg in the morning as needed. 2 mg at night.  Do not take xanax.  Checking TSH level.  I recommend that you meet with our psychologist, Dr. Michelle Kane, for help dealing with your anxiety and depresion.  You can schedule an appointment with her by calling her directly at [phone]. 

## 2013-02-19 NOTE — Assessment & Plan Note (Addendum)
A: worsening anxiety and depression in the setting of stressors. Poor coping.  P: Please continue ativan only. 1 mg in the morning as needed. 2 mg at night.  Do not take xanax.  Checking TSH level.  I recommend that you meet with our psychologist, Dr. Spero Geralds, for help dealing with your anxiety and depresion.  You can schedule an appointment with her by calling her directly at [phone].

## 2013-02-19 NOTE — Patient Instructions (Addendum)
Modene,  Thank you for coming in today. I am sorry to hear about the troubles with your sons. I will be praying for you and them.  Please continue ativan only. 1 mg in the morning as needed. 2 mg at night.  Do not take xanax.   I recommend that you meet with our psychologist, Dr. Spero Geralds, for help dealing with your anxiety and depresion.  You can schedule an appointment with her by calling her directly at [phone].  Have a safe trip to [place].  Please see me in one week after your return.   Dr. Armen Pickup
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
## 2013-02-20 ENCOUNTER — Telehealth: Payer: Self-pay | Admitting: Family Medicine

## 2013-02-20 DIAGNOSIS — E039 Hypothyroidism, unspecified: Secondary | ICD-10-CM

## 2013-02-20 NOTE — Telephone Encounter (Signed)
Called patient. She reports that she is about the same today as she was yesterday regarding her mood.  She slept two hours last night.  Informed patient that her TSH was 35. She reports compliance with synthroid. She takes it in the morning with all of her AM medications. She has not missed doses.   Assessment:  Hypothyroidism with elevated TSH. Patient reported compliance with synthroid. No evidence of impending myxedema on  recent exam (specifically no stupor or mental status changes and no edema).   Plan:  Continue synthroid at current dose. Advised patient take synthroid in the AM at least thirty minutes before her first meal with water only.  Recheck TSH in 6-8 weeks.  Patient agreed with plan and voiced understanding.

## 2013-02-20 NOTE — Assessment & Plan Note (Signed)
Assessment:  Hypothyroidism with elevated TSH. Patient reported compliance with synthroid. No evidence of impending myxedema on  recent exam (specifically no stupor or mental status changes and no edema).   Plan:  Continue synthroid at current dose. Advised patient take synthroid in the AM at least thirty minutes before her first meal with water only.  Recheck TSH in 6-8 weeks.  Patient agreed with plan and voiced understanding.

## 2013-02-23 ENCOUNTER — Encounter: Payer: Self-pay | Admitting: Internal Medicine

## 2013-02-23 DIAGNOSIS — Z860100 Personal history of colon polyps, unspecified: Secondary | ICD-10-CM | POA: Insufficient documentation

## 2013-02-23 HISTORY — DX: Personal history of colonic polyps: Z86.010

## 2013-02-23 HISTORY — DX: Personal history of colon polyps, unspecified: Z86.0100

## 2013-02-23 NOTE — Progress Notes (Signed)
Quick Note:  Diminutive sessile serrated adenoma Repeat colon 2019 ______

## 2013-04-03 ENCOUNTER — Ambulatory Visit (INDEPENDENT_AMBULATORY_CARE_PROVIDER_SITE_OTHER): Payer: Medicaid Other | Admitting: Cardiology

## 2013-04-03 ENCOUNTER — Encounter: Payer: Self-pay | Admitting: Cardiology

## 2013-04-03 ENCOUNTER — Other Ambulatory Visit: Payer: Self-pay | Admitting: Physical Medicine and Rehabilitation

## 2013-04-03 VITALS — BP 118/82 | HR 81 | Ht 64.0 in | Wt 137.0 lb

## 2013-04-03 DIAGNOSIS — M25552 Pain in left hip: Secondary | ICD-10-CM

## 2013-04-03 DIAGNOSIS — I10 Essential (primary) hypertension: Secondary | ICD-10-CM

## 2013-04-03 DIAGNOSIS — E785 Hyperlipidemia, unspecified: Secondary | ICD-10-CM

## 2013-04-03 DIAGNOSIS — I201 Angina pectoris with documented spasm: Secondary | ICD-10-CM

## 2013-04-03 LAB — LIPID PANEL: Total CHOL/HDL Ratio: 5

## 2013-04-03 LAB — BASIC METABOLIC PANEL
CO2: 27 mEq/L (ref 19–32)
Chloride: 100 mEq/L (ref 96–112)
Glucose, Bld: 96 mg/dL (ref 70–99)
Potassium: 3.6 mEq/L (ref 3.5–5.1)
Sodium: 136 mEq/L (ref 135–145)

## 2013-04-03 LAB — LDL CHOLESTEROL, DIRECT: Direct LDL: 210 mg/dL

## 2013-04-03 MED ORDER — CHLORTHALIDONE 25 MG PO TABS: 25.0000 mg | ORAL_TABLET | Freq: Every day | ORAL | Status: DC

## 2013-04-03 MED ORDER — SPIRONOLACTONE 50 MG PO TABS: 50.0000 mg | ORAL_TABLET | Freq: Every day | ORAL | Status: DC

## 2013-04-03 MED ORDER — NIFEDIPINE ER OSMOTIC RELEASE 90 MG PO TB24: 90.0000 mg | ORAL_TABLET | Freq: Every day | ORAL | Status: DC

## 2013-04-03 MED ORDER — NITROGLYCERIN 0.4 MG SL SUBL
0.4000 mg | SUBLINGUAL_TABLET | SUBLINGUAL | Status: DC | PRN
Start: 2013-04-03 — End: 2016-03-22

## 2013-04-03 MED ORDER — LABETALOL HCL 200 MG PO TABS: 800.0000 mg | ORAL_TABLET | Freq: Two times a day (BID) | ORAL | Status: DC

## 2013-04-03 NOTE — Progress Notes (Signed)
Patient ID: [name] Rowe, female   DOB: 23-Mar-1961, 52 y.o.   MRN: [id number] PCP: Dr. Armen Pickup  52 yo with history of fibromuscular dysplasia of the renal arteries, HTN, and possible coronary vasospasm versus microvascular angina presents for followup.  Patient was admitted to the hospital in [place] in 2009 with severe chest pain.  At that time, she had a left heart cath showing no angiographic coronary disease but the catheter did induce RCA spasm, leading to concern that her chest pain might be coronary vasospasm.  She also had HTN found to be probably related to fibromuscular dysplasia of the renal arteries.  She had a stent placed in the right renal artery in 2009.    She was admitted twice in 1/13, the first time with a hypertensive urgency (she had been off her BP meds for a number of days).  She was re-admitted several days later with orthostatic symptoms and probable rebound tachycardia (she had not picked up her beta blocker prescription after discharge).  She was restarted on beta blocker and HCTZ was stopped.  Carotids in 2/13 showed no significant stenosis and renal artery dopplers in 2/13 showed no significant stenosis.    She was admitted again in 7/13 with atypical chest pain.  She had an ETT-myoview.  This was a submaximal study but showed no ischemia or infarction, EF 77%.  She was in the ER with chest pain in 10/13 but was sent home after cardiac enzymes and ECG were unremarkable. She was admitted again in 5/14 with atypical chest pain, ruled out for MI, and sent home.   Since I last saw her, she had side effects with ranolazine (started for microvascular angina) and had to stop it.  She also had side effects with clonidine.  She continues to have atypical chest pain episodes (nonexertional) once or twice a week, not as bad as in the past.  They are usually responsive to NTG.  She has not tolerated Imdur due to headache.  She has been under a lot of stress.  Her son is in a difficult  custody fight.     Labs (10/11): LDL 136, HDL 44, TGs 159, creatinine 1.06, K 3.8 Labs (11/11): LDL 101, HDL 49, LFTs normal, K 4.8, creatinine 0.83 Labs (1/13): K 4.2, creatinine 1.16, urine metanephrines normal, PRA/aldosterone ratio normal, LDL 156, HDL 56 Labs (7/13): K 3.8, creatinine 0.96, TSH normal, LDL 98, HDL 60 Labs (9/13): LDL 166, TSH normal, K 3.7, creatinine 1.0 Labs (5/14): K 3.6, creatinine 1.16, LDL 168, HDL 51 Labs (7/14): LDL 179 Labs (8/14): K 4.4, creatinine 1.02 Labs (9/14): TSH 34  ECG: NSR, normal  Allergies (verified):  1)  ! Compazine 2)  ! Imitrex  Past Medical History: 1. Migraine headaches 2. Chest pain: No angiographic coronary disease on cath in 2009 in [place].  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  Suspect coronary vasospasm versus microvascular obstruction.  Lexiscan myoview (11/11): EF 79%, normal perfusion.  ETT-myoview (9/13) with EF 77%, no ischemic or infarction but submaximal study.  Headaches with Imdur.  Did not tolerate ranolazine.  3. HTN: Renal artery stenosis has played a role. Urinary metanephrines and plasma renin/aldosterone ratio normal.  Does not tolerate clonidine.  4. 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  She had a renal artery stent in 2009.  Renal artery dopplers (12/11) suggestive of fibromuscular dysplasia but no significant stenosis. CTA abdomen (1/13) with evidence for fibromuscular dysplasia, right renal artery stent appears patent.  Renal artery  dopplers (2/13) with FMD but no evidence for signficant stenosis.  5. Hyperlipidemia 6. Fibromyalgia 7. Echo (1/13) EF 55-60%, grade I diastolic dysfunction. 8. Hypothyroidism 9. Carotid dopplers (2/13): consistent with FMD but no significant stenosis.  10. Fibromuscular dysplasia.  11. L-spine degenerative disc disease    Family History: Mother with MI at 22, Father with MI at 68, brother with MI at 95 and died with stroke at 3  Social History: Moved  to [place] from [place] with her husband.  Denies smoking cigarettes or using illegal drugs Alcohol Use - no Drug Use - no  ROS: All systems reviewed and negative except as per HPI.    Current Outpatient Prescriptions  Medication Sig Dispense Refill  . aspirin EC 81 MG tablet Take 81 mg by mouth daily.      Marland Kitchen atorvastatin (LIPITOR) 80 MG tablet Take 1 tablet (80 mg total) by mouth daily.  30 tablet  3  . chlorthalidone (HYGROTON) 25 MG tablet Take 1 tablet (25 mg total) by mouth daily.  90 tablet  1  . cyclobenzaprine (FLEXERIL) 10 MG tablet Take 1 tablet (10 mg total) by mouth 3 (three) times daily as needed for muscle spasms.  30 tablet  0  . DULoxetine (CYMBALTA) 60 MG capsule Take 1 capsule (60 mg total) by mouth daily.  90 capsule  3  . ergocalciferol (VITAMIN D2) 50000 UNITS capsule Take 1 capsule (50,000 Units total) by mouth once a week. On monday  12 capsule  0  . fluticasone (FLONASE) 50 MCG/ACT nasal spray Place 2 sprays into the nose at bedtime.  16 g  6  . gabapentin (NEURONTIN) 600 MG tablet Take 1,200 mg by mouth at bedtime. Take two tabs at bedtime      . labetalol (NORMODYNE) 200 MG tablet Take 4 tablets (800 mg total) by mouth 2 (two) times daily.  720 tablet  1  . levothyroxine (SYNTHROID, LEVOTHROID) 125 MCG tablet Take 1.5 tablets (187.5 mcg total) by mouth daily before breakfast.  45 tablet  6  . Linaclotide (LINZESS) 290 MCG CAPS capsule Take 1 capsule (290 mcg total) by mouth daily.  32 capsule  0  . loratadine (CLARITIN) 10 MG tablet Take 10 mg by mouth as needed.      Marland Kitchen LORazepam (ATIVAN) 1 MG tablet 1 mg by mouth during the day as needed. 2 mg by mouth before bed as needed.  45 tablet  5  . NIFEdipine (PROCARDIA XL/ADALAT-CC) 90 MG 24 hr tablet Take 1 tablet (90 mg total) by mouth daily.  90 tablet  1  . nitroGLYCERIN (NITROSTAT) 0.4 MG SL tablet Place 1 tablet (0.4 mg total) under the tongue every 5 (five) minutes as needed. x3 doses as needed for chest pain   100 tablet  3  . ondansetron (ZOFRAN ODT) 8 MG disintegrating tablet Take 1 tablet (8 mg total) by mouth every 8 (eight) hours as needed for nausea.  20 tablet  0  . pantoprazole (PROTONIX) 40 MG tablet Take 1 tablet (40 mg total) by mouth daily.  30 tablet  2  . potassium chloride SA (K-DUR,KLOR-CON) 20 MEQ tablet Take 20 mEq by mouth daily.      Marland Kitchen rOPINIRole (REQUIP) 0.25 MG tablet Take 0.5 mg by mouth at bedtime. Start one tab daily, increase by one tab every 3-4 days until 2 mg daily.      . sodium chloride (OCEAN NASAL SPRAY) 0.65 % nasal spray Place 1 spray into the nose as  needed for congestion.  30 mL  12  . spironolactone (ALDACTONE) 50 MG tablet Take 1 tablet (50 mg total) by mouth daily.  90 tablet  1  . triamcinolone cream (KENALOG) 0.1 % Apply topically 2 (two) times daily. Apply to groin  30 g  0  . zolpidem (AMBIEN) 5 MG tablet Take 1-2 tablets (5-10 mg total) by mouth at bedtime as needed. For sleep  30 tablet  2   No current facility-administered medications for this visit.    BP 118/82  Pulse 81  Ht 5\' 4"  (1.626 m)  Wt 62.143 kg (137 lb)  BMI 23.5 kg/m2 General:  Well developed, well nourished, in no acute distress. Neck:  Neck supple, no JVD. No masses, thyromegaly or abnormal cervical nodes. Lungs:  Clear bilaterally to auscultation and percussion. Heart:  Non-displaced PMI, chest non-tender; regular rate and rhythm, S1, S2 without murmurs, rubs. +S4. Carotid upstroke normal, no bruit.  Pedals normal pulses. No edema, no varicosities. Abdomen:  Bowel sounds positive; abdomen soft and non-tender without masses, organomegaly, or hernias noted. No hepatosplenomegaly. Extremities:  No clubbing or cyanosis. Neurologic:  Alert and oriented x 3. Psych:  Normal affect.  Assessment/Plan:  Chest pain I suspect that the patient has either coronary microvascular disease (Syndrome X) or coronary vasospasm.  She had a cath with no angiographic coronary disease in 2009 and  Lexiscan myoview in 11/11 showed no evidence for ischemia or infarction. Repeat myoview in 7/13 showed no ischemia or infarction but was submaximal.  She continues to have periodic chest pain episodes responsive to NTG.  She cannot take Imdur due to headaches and she also had side effects with ranolazine.  Currently, symptoms are reasonably controlled and stable.  I suspect stress also plays a role.  HTN  BP controlled.  Continue current regimen.  BMET today (need to follow K every 3-4 months with spironolactone).  HYPERLIPIDEMIA  Check lipids today, she says that she has been taking her atorvastatin as ordered.   [name] 04/03/2013

## 2013-04-03 NOTE — Patient Instructions (Addendum)
Your physician recommends that you have a FASTING lipid profile /BMET today.  Your physician wants you to follow-up in: 4 months with Dr Shirlee Latch. (February 2015). You will receive a reminder letter in the mail two months in advance. If you don't receive a letter, please call our office to schedule the follow-up appointment.

## 2013-04-06 ENCOUNTER — Encounter: Payer: Self-pay | Admitting: Family Medicine

## 2013-04-06 ENCOUNTER — Ambulatory Visit (INDEPENDENT_AMBULATORY_CARE_PROVIDER_SITE_OTHER): Payer: Medicaid Other | Admitting: Family Medicine

## 2013-04-06 VITALS — BP 134/88 | HR 72 | Temp 98.1°F | Wt 138.0 lb

## 2013-04-06 DIAGNOSIS — E039 Hypothyroidism, unspecified: Secondary | ICD-10-CM

## 2013-04-06 DIAGNOSIS — Z23 Encounter for immunization: Secondary | ICD-10-CM

## 2013-04-06 NOTE — Patient Instructions (Signed)
Mrs. [name] Rowe,  Thank you for coming in today, I will call on Monday with lab results.  Dr. Armen Pickup

## 2013-04-06 NOTE — Assessment & Plan Note (Addendum)
A: compliant with night synthroid. P: repeat TSH. Adjust as needed.   TSH improved  Lab Results  Component Value Date   TSH 5.635* 04/06/2013   Increase synthroid from 187.5 to 200 mcg qHS Repeat TSH in 8 weeks.

## 2013-04-06 NOTE — Progress Notes (Signed)
  Subjective:    Patient ID: [name] Rowe, female    DOB: 1961-02-10, 52 y.o.   MRN: [id number]  HPI 52 year old female presents for follow visit to discuss the following:  #1 hypothyroidism: Patient is compliant with Synthroid. She takes her dose on into stomach and night. She admits to palpitations and anxiety. She denies sweating, weight changes, persistent edema (has some leg and hand swelling at night).  Review of Systems As per history of present illness    Objective:   Physical Exam BP 134/88  Pulse 72  Temp(Src) 98.1 F (36.7 C) (Oral)  Wt 138 lb (62.596 kg)  BMI 23.68 kg/m2 General appearance: alert, cooperative and no distress Neck: no adenopathy, no carotid bruit, no JVD, supple, symmetrical, trachea midline and thyroid: enlarged and symmetric, non tender  Lungs: clear to auscultation bilaterally Heart: regular rate and rhythm, S1, S2 normal, no murmur, click, rub or gallop Neurologic: Grossly normal     Assessment & Plan:

## 2013-04-08 ENCOUNTER — Other Ambulatory Visit: Payer: Medicaid Other

## 2013-04-09 ENCOUNTER — Other Ambulatory Visit: Payer: Self-pay | Admitting: Family Medicine

## 2013-04-09 ENCOUNTER — Telehealth: Payer: Self-pay | Admitting: Psychology

## 2013-04-09 ENCOUNTER — Telehealth: Payer: Self-pay | Admitting: Cardiology

## 2013-04-09 DIAGNOSIS — E785 Hyperlipidemia, unspecified: Secondary | ICD-10-CM

## 2013-04-09 MED ORDER — CYCLOBENZAPRINE HCL 10 MG PO TABS: 10.0000 mg | ORAL_TABLET | Freq: Three times a day (TID) | ORAL | Status: DC | PRN

## 2013-04-09 MED ORDER — ROSUVASTATIN CALCIUM 40 MG PO TABS: 40.0000 mg | ORAL_TABLET | Freq: Every day | ORAL | Status: DC

## 2013-04-09 NOTE — Telephone Encounter (Signed)
Refilled flexeril.

## 2013-04-09 NOTE — Telephone Encounter (Signed)
Follow Up  Pt returning call for lab results

## 2013-04-09 NOTE — Telephone Encounter (Signed)
Spoke with patient about lab results.

## 2013-04-09 NOTE — Telephone Encounter (Signed)
Patient called to request an appointment.  I am not taking new patients right now.  Provided her two names [name] Rowe and [name] Rowe) as well as the [employer] number.  She planned to call to schedule.  Will let PCP know.

## 2013-04-10 ENCOUNTER — Telehealth: Payer: Self-pay | Admitting: Family Medicine

## 2013-04-10 MED ORDER — LEVOTHYROXINE SODIUM 200 MCG PO TABS: 200.0000 ug | ORAL_TABLET | Freq: Every day | ORAL | Status: DC

## 2013-04-10 NOTE — Addendum Note (Signed)
Addended by: Dessa Phi on: 04/10/2013 09:25 AM   Modules accepted: Orders

## 2013-04-10 NOTE — Telephone Encounter (Signed)
Called patient Improved TSH Still slightly elevated increased synthroid to 200 mcg qHS will recheck TSH in 8 weeks.   Patient agreed with plan and voiced understanding. She will call the #'s provided by Dr. Pascal Lux.

## 2013-04-16 ENCOUNTER — Other Ambulatory Visit: Payer: Self-pay | Admitting: Physical Medicine and Rehabilitation

## 2013-04-16 DIAGNOSIS — M25552 Pain in left hip: Secondary | ICD-10-CM

## 2013-04-22 ENCOUNTER — Ambulatory Visit
Admission: RE | Admit: 2013-04-22 | Discharge: 2013-04-22 | Disposition: A | Discharge status: Home / Self Care | Payer: Medicaid Other | Source: Ambulatory Visit | Attending: Physical Medicine and Rehabilitation | Admitting: Physical Medicine and Rehabilitation

## 2013-04-22 DIAGNOSIS — M25552 Pain in left hip: Secondary | ICD-10-CM

## 2013-05-07 ENCOUNTER — Encounter: Payer: Self-pay | Admitting: Family Medicine

## 2013-05-07 ENCOUNTER — Ambulatory Visit (INDEPENDENT_AMBULATORY_CARE_PROVIDER_SITE_OTHER): Payer: Medicaid Other | Admitting: Family Medicine

## 2013-05-07 VITALS — BP 131/79 | HR 105 | Temp 99.0°F | Ht 63.0 in | Wt 134.0 lb

## 2013-05-07 DIAGNOSIS — E039 Hypothyroidism, unspecified: Secondary | ICD-10-CM

## 2013-05-07 DIAGNOSIS — K5909 Other constipation: Secondary | ICD-10-CM | POA: Insufficient documentation

## 2013-05-07 DIAGNOSIS — K5900 Constipation, unspecified: Secondary | ICD-10-CM

## 2013-05-07 DIAGNOSIS — F329 Major depressive disorder, single episode, unspecified: Secondary | ICD-10-CM

## 2013-05-07 DIAGNOSIS — F3289 Other specified depressive episodes: Secondary | ICD-10-CM

## 2013-05-07 MED ORDER — LINACLOTIDE 290 MCG PO CAPS: 290.0000 ug | ORAL_CAPSULE | Freq: Every day | ORAL | Status: DC

## 2013-05-07 NOTE — Assessment & Plan Note (Signed)
reordered linzess. Anticipate PA form.

## 2013-05-07 NOTE — Assessment & Plan Note (Signed)
A: declined. P: Referral to [facility] who I called on patient's behalf per AVS.  [facility] is not taking new medicaid patients. One other option is [facility] in [place].  F/u in two weeks

## 2013-05-07 NOTE — Progress Notes (Signed)
   Subjective:    Patient ID: [name] Rowe, female    DOB: 1960/10/12, 52 y.o.   MRN: [id number] CC: depression   HPI 52 yo F presents for f/u visit to discuss the following:  1. Depression: patient has been unable to establish with a psychiatric provider. She has called the #s recommended except for behavioral health, none are taking new medicaid patients. She is compliant with medication including cymbalta and ativan. She admits to worsening depression associated with SI. She admit to thoughts of intentional overdose or intentional car accident. She has not history of suicide attempt. She lives alone. She reports that she will not try to kill herself and contracts to safety this evening. She has history of chronic pain she  Recently had L hip injections with worsening pain immediately, pain has improved. She also has a history of coronary vasospasm and had some chest pain last week requiring nitroglycerin. She denies ETOH/cigarettes/IDU.   2. Hypothyroidism: taking synthroid. Due to repeat TSH in 3 weeks.   3. Chronic constipation: previously tried lactulose, MiraLax, Amitiza and fiber w/o improvement. Now taking linzess, sample per GI. Out of medication and would like a refill. Medication has improved constipation.    Review of Systems As per HPI  PHQ-9 recently done     Objective:   Physical Exam BP 131/79  Pulse 105  Temp(Src) 99 F (37.2 C) (Oral)  Ht 5\' 3"  (1.6 m)  Wt 134 lb (60.782 kg)  BMI 23.74 kg/m2 General appearance: alert, cooperative and no distress Psych: intermittently tearful, speech and thought content normal.      Assessment & Plan:

## 2013-05-07 NOTE — Assessment & Plan Note (Signed)
F/u in 3 weeks for repeat TSH

## 2013-05-07 NOTE — Patient Instructions (Signed)
[name],  Thank you very much for coming in and talking with me today.  I have called [facility] Mixer, [phone] [facility] [address], [place] [address] [phone]   They have a walk in clinic from 8-5. I called and they recommended coming in right before 8 tomorrow morning to be assessed. You will need to bring a photo ID and your medicaid card.  If you have worsening suicidal ideation before then, please go to [facility] long ED or call [phone].   I have reordered the linzess. Anticipate a delay as I will most likely have to fill out prior authorization.   See me in 3 weeks to recheck your TSH and f/u your mood.   Dr. Armen Pickup

## 2013-05-17 ENCOUNTER — Telehealth: Payer: Self-pay | Admitting: Psychology

## 2013-05-17 NOTE — Telephone Encounter (Signed)
Received staff message about person wanting psychological services and having difficulty securing them.  Called the patient and left a VM asking her to call me back.

## 2013-05-22 ENCOUNTER — Encounter (HOSPITAL_COMMUNITY): Payer: Self-pay | Admitting: Pharmacy Technician

## 2013-05-22 ENCOUNTER — Other Ambulatory Visit: Payer: Self-pay | Admitting: Neurosurgery

## 2013-05-23 ENCOUNTER — Encounter (HOSPITAL_COMMUNITY)
Admission: RE | Admit: 2013-05-23 | Discharge: 2013-05-23 | Disposition: A | Discharge status: Home / Self Care | Payer: Medicaid Other | Source: Ambulatory Visit | Attending: Neurosurgery | Admitting: Neurosurgery

## 2013-05-23 ENCOUNTER — Other Ambulatory Visit (HOSPITAL_COMMUNITY): Payer: Self-pay | Admitting: *Deleted

## 2013-05-23 ENCOUNTER — Encounter (HOSPITAL_COMMUNITY): Payer: Self-pay

## 2013-05-23 HISTORY — DX: Other complications of anesthesia, initial encounter: T88.59XA

## 2013-05-23 HISTORY — DX: Adverse effect of unspecified anesthetic, initial encounter: T41.45XA

## 2013-05-23 HISTORY — DX: Irritable bowel syndrome, unspecified: K58.9

## 2013-05-23 LAB — CBC
HCT: 40.5 % (ref 36.0–46.0)
Platelets: 275 10*3/uL (ref 150–400)
RBC: 4.56 MIL/uL (ref 3.87–5.11)

## 2013-05-23 LAB — BASIC METABOLIC PANEL
BUN: 16 mg/dL (ref 6–23)
Creatinine, Ser: 0.98 mg/dL (ref 0.50–1.10)
GFR calc Af Amer: 76 mL/min — ABNORMAL LOW (ref >90)
GFR calc non Af Amer: 66 mL/min — ABNORMAL LOW (ref >90)
Sodium: 138 mEq/L (ref 135–145)

## 2013-05-23 NOTE — Pre-Procedure Instructions (Signed)
[name] Rowe  05/23/2013   Your procedure is scheduled on:  Friday, May 25, 2013 at 12:55 PM.   Report to [facility] Entrance "A" Admitting Office at 10:00 AM.   Call this number if you have problems the morning of surgery: [phone]   Remember:   Do not eat food or drink liquids after midnight Thursday, 05/24/13.   Take these medicines the morning of surgery with A SIP OF WATER: DULoxetine (CYMBALTA), labetalol (NORMODYNE), levothyroxine (SYNTHROID, LEVOTHROID), NIFEdipine (PROCARDIA XL/ADALAT-CC), pantoprazole (PROTONIX), ondansetron (ZOFRAN ODT) - if needed, nitroGLYCERIN (NITROSTAT) - if needed, loratadine (CLARITIN) - if needed, cyclobenzaprine (FLEXERIL) - if needed.  Stop Aspirin as of today.     Do not wear jewelry, make-up or nail polish.  Do not wear lotions, powders, or perfumes. You may wear deodorant.  Do not shave 48 hours prior to surgery.   Do not bring valuables to the hospital.  [facility] is not responsible                  for any belongings or valuables.               Contacts, dentures or bridgework may not be worn into surgery.  Leave suitcase in the car. After surgery it may be brought to your room.  For patients admitted to the hospital, discharge time is determined by your                treatment team.        Special Instructions: Shower using CHG 2 nights before surgery and the night before surgery.  If you shower the day of surgery use CHG.  Use special wash - you have one bottle of CHG for all showers.  You should use approximately 1/3 of the bottle for each shower.   Please read over the following fact sheets that you were given: Pain Booklet, Coughing and Deep Breathing, MRSA Information and Surgical Site Infection Prevention

## 2013-05-24 MED ORDER — CEFAZOLIN SODIUM-DEXTROSE 2-3 GM-% IV SOLR
2.0000 g | INTRAVENOUS | Status: AC
  Administered 2013-05-25: 2 g via INTRAVENOUS
  Filled 2013-05-24: qty 50

## 2013-05-25 ENCOUNTER — Encounter (HOSPITAL_COMMUNITY): Payer: Medicaid Other | Admitting: Anesthesiology

## 2013-05-25 ENCOUNTER — Encounter (HOSPITAL_COMMUNITY)
Admission: RE | Disposition: A | Discharge status: Home / Self Care | Payer: Self-pay | Source: Ambulatory Visit | Attending: Neurosurgery

## 2013-05-25 ENCOUNTER — Encounter (HOSPITAL_COMMUNITY): Payer: Self-pay | Admitting: Anesthesiology

## 2013-05-25 ENCOUNTER — Ambulatory Visit (HOSPITAL_COMMUNITY): Payer: Medicaid Other

## 2013-05-25 ENCOUNTER — Ambulatory Visit (HOSPITAL_COMMUNITY)
Admission: RE | Admit: 2013-05-25 | Discharge: 2013-05-26 | Disposition: A | Discharge status: Home / Self Care | Payer: Medicaid Other | Source: Ambulatory Visit | Attending: Neurosurgery | Admitting: Neurosurgery

## 2013-05-25 ENCOUNTER — Ambulatory Visit (HOSPITAL_COMMUNITY): Payer: Medicaid Other | Admitting: Anesthesiology

## 2013-05-25 DIAGNOSIS — M5126 Other intervertebral disc displacement, lumbar region: Secondary | ICD-10-CM | POA: Insufficient documentation

## 2013-05-25 DIAGNOSIS — I10 Essential (primary) hypertension: Secondary | ICD-10-CM | POA: Insufficient documentation

## 2013-05-25 DIAGNOSIS — Z8673 Personal history of transient ischemic attack (TIA), and cerebral infarction without residual deficits: Secondary | ICD-10-CM | POA: Insufficient documentation

## 2013-05-25 HISTORY — PX: LUMBAR LAMINECTOMY/DECOMPRESSION MICRODISCECTOMY: SHX5026

## 2013-05-25 SURGERY — LUMBAR LAMINECTOMY/DECOMPRESSION MICRODISCECTOMY 1 LEVEL
Anesthesia: General | Site: Spine Lumbar | Laterality: Left

## 2013-05-25 MED ORDER — POTASSIUM CHLORIDE IN NACL 20-0.9 MEQ/L-% IV SOLN
INTRAVENOUS | Status: DC
  Filled 2013-05-25 (×3): qty 1000

## 2013-05-25 MED ORDER — FENTANYL CITRATE 0.05 MG/ML IJ SOLN
INTRAMUSCULAR | Status: AC
Start: 2013-05-25 — End: 2013-05-26
  Filled 2013-05-25: qty 2

## 2013-05-25 MED ORDER — ASPIRIN EC 81 MG PO TBEC
81.0000 mg | DELAYED_RELEASE_TABLET | Freq: Every day | ORAL | Status: DC
  Administered 2013-05-25 – 2013-05-26 (×2): 81 mg via ORAL
  Filled 2013-05-25 (×2): qty 1

## 2013-05-25 MED ORDER — ONDANSETRON HCL 4 MG/2ML IJ SOLN: 4.0000 mg | INTRAMUSCULAR | Status: DC | PRN

## 2013-05-25 MED ORDER — ONDANSETRON HCL 4 MG/2ML IJ SOLN
INTRAMUSCULAR | Status: DC | PRN
  Administered 2013-05-25: 4 mg via INTRAVENOUS

## 2013-05-25 MED ORDER — OXYCODONE-ACETAMINOPHEN 5-325 MG PO TABS
1.0000 | ORAL_TABLET | ORAL | Status: DC | PRN
  Administered 2013-05-25: 2 via ORAL
  Filled 2013-05-25: qty 2

## 2013-05-25 MED ORDER — MORPHINE SULFATE 2 MG/ML IJ SOLN
1.0000 mg | INTRAMUSCULAR | Status: DC | PRN
  Administered 2013-05-25: 2 mg via INTRAVENOUS
  Filled 2013-05-25: qty 1

## 2013-05-25 MED ORDER — GABAPENTIN 600 MG PO TABS
1200.0000 mg | ORAL_TABLET | Freq: Every day | ORAL | Status: DC
  Administered 2013-05-25: 1200 mg via ORAL
  Filled 2013-05-25 (×2): qty 2

## 2013-05-25 MED ORDER — ARTIFICIAL TEARS OP OINT
TOPICAL_OINTMENT | OPHTHALMIC | Status: DC | PRN
  Administered 2013-05-25: 1 via OPHTHALMIC

## 2013-05-25 MED ORDER — TRIAMCINOLONE ACETONIDE 0.1 % EX CREA: TOPICAL_CREAM | Freq: Two times a day (BID) | CUTANEOUS | Status: DC

## 2013-05-25 MED ORDER — FLUTICASONE PROPIONATE 50 MCG/ACT NA SUSP
2.0000 | Freq: Every day | NASAL | Status: DC
  Filled 2013-05-25: qty 16

## 2013-05-25 MED ORDER — SALINE SPRAY 0.65 % NA SOLN
1.0000 | NASAL | Status: DC | PRN
Start: 2013-05-25 — End: 2013-05-26
  Filled 2013-05-25: qty 44

## 2013-05-25 MED ORDER — CYCLOBENZAPRINE HCL 10 MG PO TABS: 10.0000 mg | ORAL_TABLET | Freq: Three times a day (TID) | ORAL | Status: DC | PRN

## 2013-05-25 MED ORDER — MENTHOL 3 MG MT LOZG
1.0000 | LOZENGE | OROMUCOSAL | Status: DC | PRN
  Administered 2013-05-25: 3 mg via ORAL
  Filled 2013-05-25: qty 9

## 2013-05-25 MED ORDER — LIDOCAINE HCL (CARDIAC) 20 MG/ML IV SOLN
INTRAVENOUS | Status: DC | PRN
  Administered 2013-05-25: 80 mg via INTRAVENOUS

## 2013-05-25 MED ORDER — NITROGLYCERIN 0.4 MG SL SUBL: 0.4000 mg | SUBLINGUAL_TABLET | SUBLINGUAL | Status: DC | PRN

## 2013-05-25 MED ORDER — ATORVASTATIN CALCIUM 80 MG PO TABS
80.0000 mg | ORAL_TABLET | Freq: Every day | ORAL | Status: DC
  Administered 2013-05-25: 80 mg via ORAL
  Filled 2013-05-25 (×2): qty 1

## 2013-05-25 MED ORDER — SPIRONOLACTONE 50 MG PO TABS
50.0000 mg | ORAL_TABLET | Freq: Every day | ORAL | Status: DC
  Administered 2013-05-26: 50 mg via ORAL
  Filled 2013-05-25: qty 1

## 2013-05-25 MED ORDER — PROPOFOL 10 MG/ML IV BOLUS
INTRAVENOUS | Status: DC | PRN
  Administered 2013-05-25: 180 mg via INTRAVENOUS

## 2013-05-25 MED ORDER — OXYCODONE HCL 5 MG PO TABS: 5.0000 mg | ORAL_TABLET | Freq: Once | ORAL | Status: DC | PRN

## 2013-05-25 MED ORDER — ACETAMINOPHEN 325 MG PO TABS: 650.0000 mg | ORAL_TABLET | ORAL | Status: DC | PRN

## 2013-05-25 MED ORDER — CHLORTHALIDONE 25 MG PO TABS: 25.0000 mg | ORAL_TABLET | Freq: Every day | ORAL | Status: DC

## 2013-05-25 MED ORDER — LABETALOL HCL 300 MG PO TABS
800.0000 mg | ORAL_TABLET | Freq: Two times a day (BID) | ORAL | Status: DC
  Administered 2013-05-25 – 2013-05-26 (×2): 800 mg via ORAL
  Filled 2013-05-25 (×3): qty 1

## 2013-05-25 MED ORDER — OXYCODONE HCL 5 MG/5ML PO SOLN: 5.0000 mg | Freq: Once | ORAL | Status: DC | PRN

## 2013-05-25 MED ORDER — LEVOTHYROXINE SODIUM 200 MCG PO TABS
200.0000 ug | ORAL_TABLET | Freq: Every day | ORAL | Status: DC
  Administered 2013-05-26: 200 ug via ORAL
  Filled 2013-05-25 (×2): qty 1

## 2013-05-25 MED ORDER — HYDROCODONE-ACETAMINOPHEN 5-325 MG PO TABS: 1.0000 | ORAL_TABLET | Freq: Four times a day (QID) | ORAL | Status: DC | PRN

## 2013-05-25 MED ORDER — LACTATED RINGERS IV SOLN
INTRAVENOUS | Status: DC
  Administered 2013-05-25: 10:00:00 via INTRAVENOUS

## 2013-05-25 MED ORDER — KETOROLAC TROMETHAMINE 30 MG/ML IJ SOLN
30.0000 mg | Freq: Four times a day (QID) | INTRAMUSCULAR | Status: DC
  Administered 2013-05-25 – 2013-05-26 (×2): 30 mg via INTRAVENOUS
  Filled 2013-05-25 (×6): qty 1

## 2013-05-25 MED ORDER — CYCLOBENZAPRINE HCL 10 MG PO TABS
10.0000 mg | ORAL_TABLET | Freq: Three times a day (TID) | ORAL | Status: DC | PRN
  Administered 2013-05-25: 10 mg via ORAL
  Filled 2013-05-25: qty 1

## 2013-05-25 MED ORDER — TRAMADOL HCL 50 MG PO TABS
100.0000 mg | ORAL_TABLET | Freq: Four times a day (QID) | ORAL | Status: DC
  Administered 2013-05-25 – 2013-05-26 (×3): 100 mg via ORAL
  Filled 2013-05-25 (×3): qty 2

## 2013-05-25 MED ORDER — POTASSIUM CHLORIDE CRYS ER 20 MEQ PO TBCR
20.0000 meq | EXTENDED_RELEASE_TABLET | Freq: Every day | ORAL | Status: DC
  Administered 2013-05-26: 20 meq via ORAL
  Filled 2013-05-25 (×2): qty 1

## 2013-05-25 MED ORDER — LINACLOTIDE 290 MCG PO CAPS
290.0000 ug | ORAL_CAPSULE | Freq: Every day | ORAL | Status: DC
  Administered 2013-05-26: 290 ug via ORAL
  Filled 2013-05-25: qty 1

## 2013-05-25 MED ORDER — LIDOCAINE-EPINEPHRINE 0.5 %-1:200000 IJ SOLN
INTRAMUSCULAR | Status: DC | PRN
  Administered 2013-05-25: 10 mL

## 2013-05-25 MED ORDER — ERGOCALCIFEROL 1.25 MG (50000 UT) PO CAPS: 50000.0000 [IU] | ORAL_CAPSULE | ORAL | Status: DC

## 2013-05-25 MED ORDER — SODIUM CHLORIDE 0.9 % IJ SOLN
3.0000 mL | Freq: Two times a day (BID) | INTRAMUSCULAR | Status: DC
  Administered 2013-05-25: 3 mL via INTRAVENOUS

## 2013-05-25 MED ORDER — NIFEDIPINE ER OSMOTIC RELEASE 90 MG PO TB24
90.0000 mg | ORAL_TABLET | Freq: Every day | ORAL | Status: DC
  Administered 2013-05-26: 90 mg via ORAL
  Filled 2013-05-25: qty 1

## 2013-05-25 MED ORDER — MIDAZOLAM HCL 5 MG/5ML IJ SOLN
INTRAMUSCULAR | Status: DC | PRN
  Administered 2013-05-25: 2 mg via INTRAVENOUS

## 2013-05-25 MED ORDER — LACTATED RINGERS IV SOLN
INTRAVENOUS | Status: DC | PRN
  Administered 2013-05-25: 10:00:00 via INTRAVENOUS

## 2013-05-25 MED ORDER — HYDROCODONE-ACETAMINOPHEN 5-325 MG PO TABS: 1.0000 | ORAL_TABLET | ORAL | Status: DC | PRN

## 2013-05-25 MED ORDER — GLYCOPYRROLATE 0.2 MG/ML IJ SOLN
INTRAMUSCULAR | Status: DC | PRN
  Administered 2013-05-25: .3 mg via INTRAVENOUS

## 2013-05-25 MED ORDER — NEOSTIGMINE METHYLSULFATE 1 MG/ML IJ SOLN
INTRAMUSCULAR | Status: DC | PRN
  Administered 2013-05-25: 4 mg via INTRAVENOUS

## 2013-05-25 MED ORDER — FENTANYL CITRATE 0.05 MG/ML IJ SOLN
INTRAMUSCULAR | Status: DC | PRN
  Administered 2013-05-25 (×2): 50 ug via INTRAVENOUS

## 2013-05-25 MED ORDER — 0.9 % SODIUM CHLORIDE (POUR BTL) OPTIME
TOPICAL | Status: DC | PRN
  Administered 2013-05-25: 1000 mL

## 2013-05-25 MED ORDER — SENNA 8.6 MG PO TABS
1.0000 | ORAL_TABLET | Freq: Two times a day (BID) | ORAL | Status: DC
  Administered 2013-05-25 – 2013-05-26 (×2): 8.6 mg via ORAL
  Filled 2013-05-25 (×3): qty 1

## 2013-05-25 MED ORDER — NEOSTIGMINE METHYLSULFATE 1 MG/ML IJ SOLN: INTRAMUSCULAR | Status: DC | PRN

## 2013-05-25 MED ORDER — ACETAMINOPHEN 650 MG RE SUPP: 650.0000 mg | RECTAL | Status: DC | PRN

## 2013-05-25 MED ORDER — PHENOL 1.4 % MT LIQD: 1.0000 | OROMUCOSAL | Status: DC | PRN

## 2013-05-25 MED ORDER — PANTOPRAZOLE SODIUM 40 MG PO TBEC
40.0000 mg | DELAYED_RELEASE_TABLET | Freq: Every day | ORAL | Status: DC
  Administered 2013-05-26: 40 mg via ORAL
  Filled 2013-05-25: qty 1

## 2013-05-25 MED ORDER — ONDANSETRON HCL 4 MG/2ML IJ SOLN: 4.0000 mg | Freq: Four times a day (QID) | INTRAMUSCULAR | Status: DC | PRN

## 2013-05-25 MED ORDER — TRAMADOL HCL 50 MG PO TABS: 50.0000 mg | ORAL_TABLET | Freq: Four times a day (QID) | ORAL | Status: DC | PRN

## 2013-05-25 MED ORDER — SODIUM CHLORIDE 0.9 % IR SOLN
Status: DC | PRN
  Administered 2013-05-25: 14:00:00

## 2013-05-25 MED ORDER — PHENYLEPHRINE HCL 10 MG/ML IJ SOLN
INTRAMUSCULAR | Status: DC | PRN
  Administered 2013-05-25 (×2): 40 ug via INTRAVENOUS
  Administered 2013-05-25: 80 ug via INTRAVENOUS

## 2013-05-25 MED ORDER — ROCURONIUM BROMIDE 100 MG/10ML IV SOLN
INTRAVENOUS | Status: DC | PRN
  Administered 2013-05-25: 50 mg via INTRAVENOUS

## 2013-05-25 MED ORDER — FENTANYL CITRATE 0.05 MG/ML IJ SOLN
25.0000 ug | INTRAMUSCULAR | Status: DC | PRN
  Administered 2013-05-25: 50 ug via INTRAVENOUS

## 2013-05-25 MED ORDER — DULOXETINE HCL 60 MG PO CPEP: 60.0000 mg | ORAL_CAPSULE | Freq: Every day | ORAL | Status: DC

## 2013-05-25 MED ORDER — LORATADINE 10 MG PO TABS
10.0000 mg | ORAL_TABLET | Freq: Every day | ORAL | Status: DC | PRN
  Filled 2013-05-25: qty 1

## 2013-05-25 MED ORDER — SALINE NASAL SPRAY 0.65 % NA SOLN: 1.0000 | NASAL | Status: DC | PRN

## 2013-05-25 MED ORDER — ONDANSETRON 8 MG PO TBDP
8.0000 mg | ORAL_TABLET | Freq: Three times a day (TID) | ORAL | Status: DC | PRN
  Filled 2013-05-25: qty 1

## 2013-05-25 MED ORDER — THROMBIN 5000 UNITS EX SOLR
CUTANEOUS | Status: DC | PRN
  Administered 2013-05-25 (×2): 5000 [IU] via TOPICAL

## 2013-05-25 MED ORDER — SODIUM CHLORIDE 0.9 % IV SOLN: 250.0000 mL | INTRAVENOUS | Status: DC

## 2013-05-25 MED ORDER — HEMOSTATIC AGENTS (NO CHARGE) OPTIME
TOPICAL | Status: DC | PRN
  Administered 2013-05-25: 1 via TOPICAL

## 2013-05-25 MED ORDER — SODIUM CHLORIDE 0.9 % IJ SOLN: 3.0000 mL | INTRAMUSCULAR | Status: DC | PRN

## 2013-05-25 SURGICAL SUPPLY — 45 items
BAG DECANTER FOR FLEXI CONT (MISCELLANEOUS) ×2 IMPLANT
BENZOIN TINCTURE PRP APPL 2/3 (GAUZE/BANDAGES/DRESSINGS) IMPLANT
BLADE SURG ROTATE 9660 (MISCELLANEOUS) IMPLANT
BUR MATCHSTICK NEURO 3.0 LAGG (BURR) ×2 IMPLANT
CANISTER SUCT 3000ML (MISCELLANEOUS) ×2 IMPLANT
CONT SPEC 4OZ CLIKSEAL STRL BL (MISCELLANEOUS) ×2 IMPLANT
DECANTER SPIKE VIAL GLASS SM (MISCELLANEOUS) ×2 IMPLANT
DERMABOND ADVANCED (GAUZE/BANDAGES/DRESSINGS) ×1
DERMABOND ADVANCED .7 DNX12 (GAUZE/BANDAGES/DRESSINGS) ×1 IMPLANT
DRAPE LAPAROTOMY 100X72X124 (DRAPES) ×2 IMPLANT
DRAPE MICROSCOPE LEICA (MISCELLANEOUS) ×2 IMPLANT
DRAPE POUCH INSTRU U-SHP 10X18 (DRAPES) ×2 IMPLANT
DRAPE SURG 17X23 STRL (DRAPES) ×2 IMPLANT
DURAPREP 26ML APPLICATOR (WOUND CARE) ×2 IMPLANT
ELECT REM PT RETURN 9FT ADLT (ELECTROSURGICAL) ×2
ELECTRODE REM PT RTRN 9FT ADLT (ELECTROSURGICAL) ×1 IMPLANT
GAUZE SPONGE 4X4 16PLY XRAY LF (GAUZE/BANDAGES/DRESSINGS) IMPLANT
GLOVE ECLIPSE 6.5 STRL STRAW (GLOVE) ×2 IMPLANT
GLOVE EXAM NITRILE LRG STRL (GLOVE) IMPLANT
GLOVE EXAM NITRILE MD LF STRL (GLOVE) IMPLANT
GLOVE EXAM NITRILE XL STR (GLOVE) IMPLANT
GLOVE EXAM NITRILE XS STR PU (GLOVE) IMPLANT
GOWN BRE IMP SLV AUR LG STRL (GOWN DISPOSABLE) ×4 IMPLANT
GOWN BRE IMP SLV AUR XL STRL (GOWN DISPOSABLE) IMPLANT
GOWN STRL REIN 2XL LVL4 (GOWN DISPOSABLE) IMPLANT
KIT BASIN OR (CUSTOM PROCEDURE TRAY) ×2 IMPLANT
KIT ROOM TURNOVER OR (KITS) ×2 IMPLANT
NEEDLE HYPO 25X1 1.5 SAFETY (NEEDLE) ×2 IMPLANT
NEEDLE SPNL 18GX3.5 QUINCKE PK (NEEDLE) IMPLANT
NS IRRIG 1000ML POUR BTL (IV SOLUTION) ×2 IMPLANT
PACK LAMINECTOMY NEURO (CUSTOM PROCEDURE TRAY) ×2 IMPLANT
PAD ARMBOARD 7.5X6 YLW CONV (MISCELLANEOUS) ×6 IMPLANT
RUBBERBAND STERILE (MISCELLANEOUS) ×4 IMPLANT
SPONGE GAUZE 4X4 12PLY (GAUZE/BANDAGES/DRESSINGS) IMPLANT
SPONGE LAP 4X18 X RAY DECT (DISPOSABLE) IMPLANT
SPONGE SURGIFOAM ABS GEL SZ50 (HEMOSTASIS) ×2 IMPLANT
STRIP CLOSURE SKIN 1/2X4 (GAUZE/BANDAGES/DRESSINGS) IMPLANT
SUT VIC AB 0 CT1 18XCR BRD8 (SUTURE) ×1 IMPLANT
SUT VIC AB 0 CT1 8-18 (SUTURE) ×1
SUT VIC AB 2-0 CT1 18 (SUTURE) ×2 IMPLANT
SUT VIC AB 3-0 SH 8-18 (SUTURE) ×2 IMPLANT
SYR 20ML ECCENTRIC (SYRINGE) ×2 IMPLANT
TOWEL OR 17X24 6PK STRL BLUE (TOWEL DISPOSABLE) ×2 IMPLANT
TOWEL OR 17X26 10 PK STRL BLUE (TOWEL DISPOSABLE) ×2 IMPLANT
WATER STERILE IRR 1000ML POUR (IV SOLUTION) ×2 IMPLANT

## 2013-05-25 NOTE — Anesthesia Postprocedure Evaluation (Signed)
Anesthesia Post Note  Patient: [name] Rowe  Procedure(s) Performed: Procedure(s) (LRB): LUMBAR LAMINECTOMY/DECOMPRESSION MICRODISCECTOMY 1 LEVEL (Left)  Anesthesia type: General  Patient location: PACU  Post pain: Pain level controlled and Adequate analgesia  Post assessment: Post-op Vital signs reviewed, Patient's Cardiovascular Status Stable, Respiratory Function Stable, Patent Airway and Pain level controlled  Last Vitals:  Filed Vitals:   05/25/13 1528  BP: 128/77  Pulse: 94  Temp:   Resp: 14    Post vital signs: Reviewed and stable  Level of consciousness: awake, alert  and oriented  Complications: No apparent anesthesia complications

## 2013-05-25 NOTE — Anesthesia Preprocedure Evaluation (Signed)
Anesthesia Evaluation  Patient identified by MRN, date of birth, ID band Patient awake    Reviewed: Allergy & Precautions, H&P , NPO status , Patient's Chart, lab work & pertinent test results  Airway Mallampati: II  Neck ROM: full    Dental   Pulmonary          Cardiovascular hypertension, + Past MI and + Peripheral Vascular Disease  H/o coronary vasospasm   Neuro/Psych  Headaches, Anxiety Depression  Neuromuscular disease CVA    GI/Hepatic IBs   Endo/Other  Hypothyroidism   Renal/GU      Musculoskeletal  (+) Fibromyalgia -  Abdominal   Peds  Hematology   Anesthesia Other Findings   Reproductive/Obstetrics                           Anesthesia Physical Anesthesia Plan  ASA: III  Anesthesia Plan: General   Post-op Pain Management:    Induction: Intravenous  Airway Management Planned: Oral ETT  Additional Equipment:   Intra-op Plan:   Post-operative Plan: Extubation in OR  Informed Consent: I have reviewed the patients History and Physical, chart, labs and discussed the procedure including the risks, benefits and alternatives for the proposed anesthesia with the patient or authorized representative who has indicated his/her understanding and acceptance.     Plan Discussed with: CRNA, Anesthesiologist and Surgeon  Anesthesia Plan Comments:         Anesthesia Quick Evaluation

## 2013-05-25 NOTE — Plan of Care (Signed)
Problem: Consults Goal: Diagnosis - Spinal Surgery Outcome: Completed/Met Date Met:  05/25/13 Microdiscectomy

## 2013-05-25 NOTE — Op Note (Signed)
05/25/2013  3:31 PM  PATIENT:  [name] Rowe  52 y.o. female  PRE-OPERATIVE DIAGNOSIS:  lumbar herniated disc L5/S1 left  POST-OPERATIVE DIAGNOSIS:  lumbar herniated disc L5/S1 left  PROCEDURE:  Procedure(s): LUMBAR LAMINECTOMY/DECOMPRESSION MICRODISCECTOMY L5/S1 1 LEVEL left  SURGEON:  Surgeon(s): Carmela Hurt, MD Maeola Harman, MD  ASSISTANTS:Stern, Jomarie Longs  ANESTHESIA:   general  EBL:  Total I/O In: 1100 [I.V.:1100] Out: -   BLOOD ADMINISTERED:none  CELL SAVER GIVEN:none  COUNT:per nursing  DRAINS: none   SPECIMEN:  No Specimen  DICTATION: [name] Rowe was taken to the operating room, intubated and placed under a general anesthetic without difficulty. She was positioned prone on a Wilson frame with all pressure points padded. Her back was prepped and draped in a sterile manner. I opened the skin with a 10 blade and carried the dissection down to the thoracolumbar fascia. I used both sharp dissection and the monopolar cautery to expose the lamina of L5, and S1. I confirmed my location with an intraoperative xray.  I used the drill, Kerrison punches, and curettes to perform a semihemilaminectomy of L5. I used the punches to remove the ligamentum flavum to expose the thecal sac. I brought the microscope into the operative field and with Dr.Sterns's assistance we started our decompression of the spinal canal, thecal sac and S1 root(s). I cauterized epidural veins overlying the disc space then divided them sharply. I opened the disc space with a 15 blade and proceeded with the discectomy. I used pituitary rongeurs, curettes, and other instruments to remove disc material. After the discectomy was completed I inspected the S1 nerve root and felt it was well decompressed. I explored rostrally, laterally, medially, and caudally and was satisfied with the decompression. I irrigated the wound, then closed in layers. I approximated the thoracolumbar fascia, subcutaneous, and  subcuticular planes with vicryl sutures. I used dermabond for a sterile dressing.   PLAN OF CARE: Admit for overnight observation  PATIENT DISPOSITION:  PACU - hemodynamically stable.   Delay start of Pharmacological VTE agent (>24hrs) due to surgical blood loss or risk of bleeding:  yes

## 2013-05-25 NOTE — H&P (Signed)
[name] Rowe is a 52 year old woman who presents today for evaluation of pain which she has in her back and left lower extremity for at least the last 13 to 14 months.  She underwent two surgeries resulting in a lumbar fusion at L2-3 while living in [place].  She says she did well for a year and then afterwards had some nagging issues, but the pain that she is having now is not like it was then.  She is in a good deal of pain, has problems with sleeping.  She needs to use sleeping pills in order to get to sleep.  She has had a great deal of constipation.  She has done her best to avoid narcotic medications.  She is right handed.   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  She denies Allergic, Hematologic, Skin, Gastrointestinal, Genitourinary or Respiratory problems.   PAST MEDICAL HISTORY:                                Hypertension and cerebral vascular attack, which she had approximately two years ago.             Prior Operations:  Lumbar procedures in 11/2008 and 10/2009.             Medications and Allergies:  SHE SEIZED WHEN SHE TOOK COMPAZINE AND HAS CHEST TIGHTNESS WHEN SHE TAKES IMITREX AND DILAUDID.   FAMILY HISTORY:                                            She had a 72 year old brother who also had a stroke in the past.  Mother and father both deceased ages 44 and 34.  Heart disease, hypertension and cerebral vascular disease present in the family history.   SOCIAL HISTORY:                                            She does not smoke, she does not use alcohol, she does not use illicit drugs.   PHYSICAL EXAMINATION:                                She is 64.96" tall.  She weighs 136 pounds.  On her pain chart, she lists symptoms in the left groin around the left back  and around the left knee.  She says that it is getting worse little by little.            HEENT - Pupils equal, round and reactive to light.  Full extraocular movements.  Full visual fields.  Hearing intact to voice.  Uvula elevates in the midline.  Shoulder shrug is normal.  Tongue protrudes in the midline.             Musculoskeletal Examination - She has great difficulty taking  a 14-inch step with the left lower extremity.  She is able to toe walk and heel walk.  She has a negative Romberg test.   NEUROLOGICAL EXAMINATION:                       [name] Rowe is alert and oriented x4 and answers all questions appropriately.  Memory, language, attention span and fund of knowledge are normal.  She is well kempt and in moderate distress.  When she sits on the chair in the exam room, she cannot stop bouncy her legs and is sitting to take pressure off her hips.             Motor Examination - She has 5/5 strength in the right lower extremity, both upper extremities.                Sensory Examination - Downgoing toes to plantar touch stimulation.  Intact proprioception in both the upper and lower extremities.            Deep Tendon Reflexes - 2+ reflexes biceps, triceps, brachioradialis, knees and ankles.    DIAGNOSTIC DATA:                                          MRI of lumbar spine was reviewed.  It shows a herniated disc on the left side at L5-S1.  She probably has a solid posterolateral arthrodesis at L2-3.  No interbody bone.  The cage is placed very well to the left.  She has no other abnormalities in the lumbar spine.  The conus and the cauda are both normal.   DIAGNOSIS:                                                     1) Displaced disc left L5-S1. 2) Left S1 radiculopathy.     Risks and benefits were explained.  Ms. Alene Rowe has undergone a lumbar fusion previously.  So she has understanding of lumbar surgery.  I explained to her that this will be the less than the fusion.  I would  expect that she will be able to go on that day.  I  gave her very detailed instruction sheet with regards to the risks, benefits, and expectations both pre and postop.  She understands and wishes to proceed.

## 2013-05-25 NOTE — Preoperative (Signed)
Beta Blockers   Reason not to administer Beta Blockers:Labetalol given 05/25/13

## 2013-05-25 NOTE — Anesthesia Procedure Notes (Signed)
Procedure Name: Intubation Date/Time: 05/25/2013 1:37 PM Performed by: Sherie Don Pre-anesthesia Checklist: Patient identified, Emergency Drugs available, Suction available, Patient being monitored and Timeout performed Patient Re-evaluated:Patient Re-evaluated prior to inductionOxygen Delivery Method: Circle system utilized Preoxygenation: Pre-oxygenation with 100% oxygen Intubation Type: IV induction Ventilation: Mask ventilation without difficulty Laryngoscope Size: Mac and 3 Grade View: Grade II Tube type: Oral Tube size: 7.5 mm Number of attempts: 1 Airway Equipment and Method: Stylet Placement Confirmation: ETT inserted through vocal cords under direct vision,  positive ETCO2 and breath sounds checked- equal and bilateral Secured at: 22 cm Tube secured with: Tape Dental Injury: Teeth and Oropharynx as per pre-operative assessment

## 2013-05-25 NOTE — Transfer of Care (Signed)
Immediate Anesthesia Transfer of Care Note  Patient: [name] Rowe  Procedure(s) Performed: Procedure(s) with comments: LUMBAR LAMINECTOMY/DECOMPRESSION MICRODISCECTOMY 1 LEVEL (Left) - LEFT L5S1 microdiskectomy  Patient Location: PACU  Anesthesia Type:General  Level of Consciousness: awake, oriented and patient cooperative  Airway & Oxygen Therapy: Patient Spontanous Breathing and Patient connected to face mask oxygen  Post-op Assessment: Report given to PACU RN, Post -op Vital signs reviewed and stable and Patient moving all extremities X 4  Post vital signs: Reviewed and stable  Complications: No apparent anesthesia complications

## 2013-05-26 NOTE — Progress Notes (Signed)
Pt. Alert and oriented,follows simple instructions, denies pain. Incision area without swelling, redness or S/S of infection. Moving all extremities well and vitals stable and documented. Lumbar surgery notes instructions given to patient and family member for home safety and precautions. Pt. and family stated understanding of instructions given. Foley catheter was placed prior to discharged and patient to remove in 3 days as plan. Foley catheter care and removal  instructions given to patient at discharged. RN informed patient to call MD office if unable to void after she remove the catheter. Marin Roberts RN

## 2013-05-26 NOTE — Discharge Summary (Signed)
Physician Discharge Summary  Patient ID: [name] Rowe MRN: [id number] DOB/AGE: 07/09/1960 52 y.o.  Admit date: 05/25/2013 Discharge date: 05/26/2013  Admission Diagnoses:  Discharge Diagnoses:  Active Problems:   HNP (herniated nucleus pulposus), lumbar   Discharged Condition: good  Hospital Course: Surgery one day for lumbar HNP. Did well. Only issue post op was difficulty voiding. She says this happens every time she ahs surgery. She always goes home with a catheter, and requested that this time. Foley placed and sent home pod 1 with no residual pain.  Consults: None  Significant Diagnostic Studies: none Treatments: surgery: L5S1 microdiscectomy Discharge Exam: Blood pressure 98/66, pulse 74, temperature 97.7 F (36.5 C), temperature source Oral, resp. rate 20, SpO2 95.00%. Incision/Wound:clean and dry  Disposition: 01-Home or Self Care   Future Appointments Provider Department Dept Phone   06/12/2013 9:25 AM [facility] Office [phone]       Medication List         aspirin EC 81 MG tablet  Take 81 mg by mouth daily.     cyclobenzaprine 10 MG tablet  Commonly known as:  FLEXERIL  Take 1 tablet (10 mg total) by mouth 3 (three) times daily as needed for muscle spasms.     cyclobenzaprine 10 MG tablet  Commonly known as:  FLEXERIL  Take 1 tablet (10 mg total) by mouth 3 (three) times daily as needed for muscle spasms.     gabapentin 600 MG tablet  Commonly known as:  NEURONTIN  Take 1,200 mg by mouth at bedtime. Take two tabs at bedtime     HYDROcodone-acetaminophen 5-325 MG per tablet  Commonly known as:  NORCO/VICODIN  Take 1 tablet by mouth every 6 (six) hours as needed for moderate pain.     labetalol 200 MG tablet  Commonly known as:  NORMODYNE  Take 4 tablets (800 mg total) by mouth 2 (two) times daily.     levothyroxine 200 MCG tablet  Commonly known as:  SYNTHROID, LEVOTHROID  Take 1 tablet (200 mcg total) by  mouth at bedtime.     Linaclotide 290 MCG Caps capsule  Commonly known as:  LINZESS  Take 1 capsule (290 mcg total) by mouth daily.     NIFEdipine 90 MG 24 hr tablet  Commonly known as:  PROCARDIA XL/ADALAT-CC  Take 1 tablet (90 mg total) by mouth daily.     nitroGLYCERIN 0.4 MG SL tablet  Commonly known as:  NITROSTAT  Place 1 tablet (0.4 mg total) under the tongue every 5 (five) minutes as needed. x3 doses as needed for chest pain     pantoprazole 40 MG tablet  Commonly known as:  PROTONIX  Take 1 tablet (40 mg total) by mouth daily.     potassium chloride SA 20 MEQ tablet  Commonly known as:  K-DUR,KLOR-CON  Take 20 mEq by mouth daily.     rosuvastatin 40 MG tablet  Commonly known as:  CRESTOR  Take 1 tablet (40 mg total) by mouth daily.     spironolactone 50 MG tablet  Commonly known as:  ALDACTONE  Take 1 tablet (50 mg total) by mouth daily.     traMADol 50 MG tablet  Commonly known as:  ULTRAM  Take 1 tablet (50 mg total) by mouth every 6 (six) hours as needed.           Follow-up Information   Follow up with CABBELL,KYLE L, MD In 3 weeks. (call to make appointment)  Specialty:  Neurosurgery   Contact information:   [address]                         [address] [place] [address] [phone]       At home rest most of the time. Get up 9 or 10 times each day and take a 15 or 20 minute walk. No riding in the car and to your first postoperative appointment. If you have neck surgery you may shower from the chest down starting on the third postoperative day. If you had back surgery he may start showering on the third postoperative day with saran wrap wrapped around your incisional area 3 times. After the shower remove the saran wrap. Take pain medicine as needed and other medications as instructed. Call my office for an appointment.  SignedReinaldo Meeker, MD 05/26/2013, 9:46 AM

## 2013-05-28 ENCOUNTER — Ambulatory Visit: Payer: Medicaid Other | Admitting: Family Medicine

## 2013-05-29 ENCOUNTER — Telehealth: Payer: Self-pay | Admitting: Family Medicine

## 2013-05-29 NOTE — Telephone Encounter (Signed)
Called patient to check on her. She is s/p LUMBAR LAMINECTOMY/DECOMPRESSION MICRODISCECTOMY L5/S1 1 LEVEL left  On 05/25/13. She reports pain is well controlled. Sh is at her sister's house in [place]. She has neurosurgery  f/u on 06/18/12.   She has no acute needs at this time.

## 2013-05-30 ENCOUNTER — Encounter (HOSPITAL_COMMUNITY): Payer: Self-pay | Admitting: Neurosurgery

## 2013-06-12 ENCOUNTER — Ambulatory Visit (INDEPENDENT_AMBULATORY_CARE_PROVIDER_SITE_OTHER): Payer: Medicaid Other

## 2013-06-12 DIAGNOSIS — E785 Hyperlipidemia, unspecified: Secondary | ICD-10-CM

## 2013-06-12 DIAGNOSIS — I10 Essential (primary) hypertension: Secondary | ICD-10-CM

## 2013-06-12 DIAGNOSIS — M25552 Pain in left hip: Secondary | ICD-10-CM

## 2013-06-12 LAB — HEPATIC FUNCTION PANEL
ALT: 35 U/L (ref 0–35)
AST: 23 U/L (ref 0–37)
Albumin: 4 g/dL (ref 3.5–5.2)
Alkaline Phosphatase: 82 U/L (ref 39–117)
BILIRUBIN DIRECT: 0 mg/dL (ref 0.0–0.3)
Total Bilirubin: 0.5 mg/dL (ref 0.3–1.2)
Total Protein: 7.2 g/dL (ref 6.0–8.3)

## 2013-06-12 LAB — LIPID PANEL
Cholesterol: 228 mg/dL — ABNORMAL HIGH (ref 0–200)
HDL: 59.5 mg/dL (ref >39.00)
Total CHOL/HDL Ratio: 4
Triglycerides: 130 mg/dL (ref 0.0–149.0)
VLDL: 26 mg/dL (ref 0.0–40.0)

## 2013-06-12 LAB — BASIC METABOLIC PANEL
BUN: 12 mg/dL (ref 6–23)
CALCIUM: 9 mg/dL (ref 8.4–10.5)
CO2: 32 meq/L (ref 19–32)
CREATININE: 1.2 mg/dL (ref 0.4–1.2)
Chloride: 100 mEq/L (ref 96–112)
GFR: 48.32 mL/min — ABNORMAL LOW (ref >60.00)
Glucose, Bld: 92 mg/dL (ref 70–99)
Potassium: 3.6 mEq/L (ref 3.5–5.1)
Sodium: 138 mEq/L (ref 135–145)

## 2013-06-12 LAB — LDL CHOLESTEROL, DIRECT: Direct LDL: 147.4 mg/dL

## 2013-06-15 ENCOUNTER — Telehealth: Payer: Self-pay | Admitting: Cardiology

## 2013-06-15 NOTE — Telephone Encounter (Signed)
New message ° ° ° ° ° °Returning a nurses call to get test results °

## 2013-06-15 NOTE — Telephone Encounter (Signed)
pt notofied about her lab results and does state she has been taking crestor daily. States she had surgery and people were bringing her foods to eat that weren'y so healthy. Pt advised to try and get back on track with diet, pt said ok and thank you

## 2013-06-19 ENCOUNTER — Encounter: Payer: Self-pay | Admitting: Family Medicine

## 2013-06-19 ENCOUNTER — Ambulatory Visit (INDEPENDENT_AMBULATORY_CARE_PROVIDER_SITE_OTHER): Payer: Medicaid Other | Admitting: Family Medicine

## 2013-06-19 VITALS — BP 136/95 | HR 87 | Ht 64.0 in | Wt 132.0 lb

## 2013-06-19 DIAGNOSIS — K5900 Constipation, unspecified: Secondary | ICD-10-CM

## 2013-06-19 DIAGNOSIS — E039 Hypothyroidism, unspecified: Secondary | ICD-10-CM

## 2013-06-19 DIAGNOSIS — M5126 Other intervertebral disc displacement, lumbar region: Secondary | ICD-10-CM

## 2013-06-19 DIAGNOSIS — K5909 Other constipation: Secondary | ICD-10-CM

## 2013-06-19 MED ORDER — LINACLOTIDE 290 MCG PO CAPS: 290.0000 ug | ORAL_CAPSULE | Freq: Every day | ORAL | Status: DC

## 2013-06-19 NOTE — Assessment & Plan Note (Signed)
Resent and printed linzess rx

## 2013-06-19 NOTE — Patient Instructions (Signed)
Mrs. [name] Rowe,  Thank you for coming in today. It was lovely seeing you.   I will be in touch with TSH results and whether or not your synthroid dose will need to be changed. I have resent linzess, I see the error I made in sending it last time. I have also printed the script for you.  Please keep your follow up visits with your surgeon and therapist and psychiatrist. Call my office with the new medications your are taking.    Dr. Adrian Blackwater

## 2013-06-19 NOTE — Assessment & Plan Note (Signed)
s/p LUMBAR LAMINECTOMY/DECOMPRESSION MICRODISCECTOMY L5/S1 1 LEVEL left On 05/25/13.  She reports pain is well controlled.

## 2013-06-19 NOTE — Progress Notes (Signed)
   Subjective:    Patient ID: [name] Rowe, female    DOB: [birth year], 54 y.o.   MRN: [id number]  HPI 53 yo F presents for f/u visit:  1. Hypothyroidism: taking synthroid. Denies CP, edema, palpitations.   2. Chronic constipation: persistent. Her pharmacy did not receive linzess rx. No N/V/abdomnial pain.   3. S/p LUMBAR LAMINECTOMY/DECOMPRESSION MICRODISCECTOMY L5/S1 1 LEVEL left On 05/25/13. Still with mild pain. Pain overall improved. Not taking tramadol or vicodin.    Review of Systems As per HPI     Objective:   Physical Exam  BP 136/95  Pulse 87  Ht 5\' 4"  (1.626 m)  Wt 132 lb (59.875 kg)  BMI 22.65 kg/m2 General appearance: alert, cooperative and no distress Neck: no adenopathy, no carotid bruit, no JVD, supple, symmetrical, trachea midline and thyroid not enlarged, symmetric, no tenderness/mass/nodules Lungs: normal WOB.  Heart: regular pulse at 84 Extremities: extremities normal, atraumatic, no cyanosis or edema Neuro: gait is a bit slow but much less antalgic than before.      Assessment & Plan:

## 2013-06-19 NOTE — Assessment & Plan Note (Signed)
A: compliance with meds no s/s of hypo or hyperthyroidism P: recheck TSH given dose adjustment 8 weeks ago.

## 2013-06-20 ENCOUNTER — Encounter: Payer: Self-pay | Admitting: Family Medicine

## 2013-06-20 ENCOUNTER — Telehealth: Payer: Self-pay | Admitting: Family Medicine

## 2013-06-20 DIAGNOSIS — E039 Hypothyroidism, unspecified: Secondary | ICD-10-CM

## 2013-06-20 LAB — TSH: TSH: 78.332 u[IU]/mL — AB (ref 0.350–4.500)

## 2013-06-20 NOTE — Assessment & Plan Note (Signed)
Called patient.  TSH is very high. She guarantees compliance with synthroid at night with gabapentin and seroquel (started 4 weeks ago).  We reviewed her medications again. She takes a PPI in the AM.  She reports not eating much in general, rarely eats lunch. Breakfast and small dinner only. She confirms chronic constipation. She reports an episode of significant fatigue and dizziness, last a few minutes that resolved. No edema. No confusuion.   Plan  She will start taking synthroid around noon- at least 2 hrs after breakfast and 2 hrs before dinner. Return to clinic in one week for repeat TSH.  If still high w/o improvement. Increase dose further.

## 2013-06-20 NOTE — Telephone Encounter (Addendum)
Called patient.  TSH is very high. She guarantees compliance with synthroid at night with gabapentin and seroquel (started 4 weeks ago).  We reviewed her medications again. She takes a PPI in the AM.  She reports not eating much in general, rarely eats lunch. Breakfast and small dinner only. She confirms chronic constipation. She reports an episode of significant fatigue and dizziness, last a few minutes that resolved. No edema. No confusuion.   Plan  She will start taking synthroid around noon- at least 2 hrs after breakfast and 2 hrs before dinner. Return to clinic in one week for repeat TSH.  If still high w/o improvement. Increase dose further.   

## 2013-06-26 ENCOUNTER — Encounter: Payer: Self-pay | Admitting: Family Medicine

## 2013-06-26 ENCOUNTER — Ambulatory Visit (INDEPENDENT_AMBULATORY_CARE_PROVIDER_SITE_OTHER): Payer: Medicaid Other | Admitting: Family Medicine

## 2013-06-26 VITALS — BP 113/76 | HR 121 | Temp 98.9°F | Ht 64.0 in | Wt 136.0 lb

## 2013-06-26 DIAGNOSIS — R6889 Other general symptoms and signs: Secondary | ICD-10-CM | POA: Insufficient documentation

## 2013-06-26 MED ORDER — OSELTAMIVIR PHOSPHATE 75 MG PO CAPS: 75.0000 mg | ORAL_CAPSULE | Freq: Two times a day (BID) | ORAL | Status: AC

## 2013-06-26 MED ORDER — GUAIFENESIN-CODEINE 100-10 MG/5ML PO SOLN: 5.0000 mL | Freq: Four times a day (QID) | ORAL | Status: DC | PRN

## 2013-06-26 NOTE — Patient Instructions (Signed)
[name],  Thank you for coming in today. Your symptoms are consistent with flu.  Start tamiflu for 5 days Take the robitussin [facility] as needed for cough and congestion. The [facility] is codeine.   Try chewing ginger or drinking boiled ginger root if you have nausea with tamiflu.  Call if you need prescription nausea medication.   Dr. Richardine Service

## 2013-06-26 NOTE — Assessment & Plan Note (Signed)
Your symptoms are consistent with flu.  Start tamiflu for 5 days Take the robitussin [facility] as needed for cough and congestion. The [facility] is codeine.   Try chewing ginger or drinking boiled ginger root if you have nausea with tamiflu.  Call if you need prescription nausea medication.

## 2013-06-26 NOTE — Progress Notes (Signed)
   Subjective:    Patient ID: [name] Rowe, female    DOB: 1960/07/15, 53 y.o.   MRN: [id number]  HPI 53 yo F presents for same day visit:  1. Flu like symptoms: sneeze, cough, headache, hot and cold chills, body aches x 3 days. Had some CP and SOB yesterday. None today. T max 100.1 yesterday. No known sick contacts. Lives alone. Taking tylenol and corcidin HBP.   Review of Systems As per HPI    Objective:   Physical Exam BP 113/76  Pulse 121  Temp(Src) 98.9 F (37.2 C) (Oral)  Ht 5\' 4"  (1.626 m)  Wt 136 lb (61.689 kg)  BMI 23.33 kg/m2  SpO2 98% General appearance: alert, cooperative and no distress Head: Normocephalic, without obvious abnormality, atraumatic Throat: lips, mucosa, and tongue normal; teeth and gums normal Neck: no adenopathy, no carotid bruit, no JVD, supple, symmetrical, trachea midline and thyroid not enlarged, symmetric, no tenderness/mass/nodules Lungs: clear to auscultation bilaterally Heart: regular rate and rhythm, S1, S2 normal, no murmur, click, rub or gallop       Assessment & Plan:

## 2013-07-11 ENCOUNTER — Telehealth: Payer: Self-pay | Admitting: Cardiology

## 2013-07-11 NOTE — Telephone Encounter (Signed)
Pt states she has more frequent chest pain for about 1 month. The chest pain is similar to the past and has been relieved with NTG.

## 2013-07-11 NOTE — Telephone Encounter (Signed)
Pt also notes some dizziness for the last month and states her BP has been up and down. She states her BP this morning was 184/109 after taking her meds. I reviewed with Dr Vassie Moment recommended pt take an extra labetalol 200mg  now. Pt already has an appt scheduled for 07/17/13. Dr Aundra Dubin advised pt to take and record BP and heart rate and bring the record of the readings to her appt. Pt knows to call if symptoms do not improve.

## 2013-07-11 NOTE — Telephone Encounter (Signed)
New Message  Pt called states that she is having chest pain and dizziness that has been frequent in the last 2 weeks. Made appt with PA Richardson Dopp for 07/17/2013 Please call if a sooner appt is needed

## 2013-07-17 ENCOUNTER — Encounter: Payer: Self-pay | Admitting: Physician Assistant

## 2013-07-17 ENCOUNTER — Ambulatory Visit (INDEPENDENT_AMBULATORY_CARE_PROVIDER_SITE_OTHER): Payer: Medicaid Other | Admitting: Physician Assistant

## 2013-07-17 VITALS — BP 154/105 | HR 99 | Ht 64.0 in | Wt 136.0 lb

## 2013-07-17 DIAGNOSIS — I7789 Other specified disorders of arteries and arterioles: Secondary | ICD-10-CM

## 2013-07-17 DIAGNOSIS — R079 Chest pain, unspecified: Secondary | ICD-10-CM

## 2013-07-17 DIAGNOSIS — R002 Palpitations: Secondary | ICD-10-CM

## 2013-07-17 DIAGNOSIS — E785 Hyperlipidemia, unspecified: Secondary | ICD-10-CM

## 2013-07-17 DIAGNOSIS — I951 Orthostatic hypotension: Secondary | ICD-10-CM

## 2013-07-17 DIAGNOSIS — R42 Dizziness and giddiness: Secondary | ICD-10-CM

## 2013-07-17 DIAGNOSIS — I10 Essential (primary) hypertension: Secondary | ICD-10-CM

## 2013-07-17 DIAGNOSIS — I773 Arterial fibromuscular dysplasia: Secondary | ICD-10-CM

## 2013-07-17 LAB — CBC WITH DIFFERENTIAL/PLATELET
Basophils Absolute: 0 10*3/uL (ref 0.0–0.1)
Basophils Relative: 0.4 % (ref 0.0–3.0)
EOS ABS: 0.1 10*3/uL (ref 0.0–0.7)
Eosinophils Relative: 1.5 % (ref 0.0–5.0)
HCT: 40.7 % (ref 36.0–46.0)
Hemoglobin: 13.5 g/dL (ref 12.0–15.0)
LYMPHS ABS: 1.3 10*3/uL (ref 0.7–4.0)
Lymphocytes Relative: 17 % (ref 12.0–46.0)
MCHC: 33.1 g/dL (ref 30.0–36.0)
MCV: 92.2 fl (ref 78.0–100.0)
MONO ABS: 0.4 10*3/uL (ref 0.1–1.0)
Monocytes Relative: 5.3 % (ref 3.0–12.0)
NEUTROS ABS: 5.9 10*3/uL (ref 1.4–7.7)
NEUTROS PCT: 75.8 % (ref 43.0–77.0)
Platelets: 329 10*3/uL (ref 150.0–400.0)
RBC: 4.41 Mil/uL (ref 3.87–5.11)
RDW: 13.8 % (ref 11.5–14.6)
WBC: 7.7 10*3/uL (ref 4.5–10.5)

## 2013-07-17 LAB — BASIC METABOLIC PANEL
BUN: 17 mg/dL (ref 6–23)
CO2: 30 mEq/L (ref 19–32)
CREATININE: 1 mg/dL (ref 0.4–1.2)
Calcium: 8.9 mg/dL (ref 8.4–10.5)
Chloride: 104 mEq/L (ref 96–112)
GFR: 63.38 mL/min (ref >60.00)
Glucose, Bld: 75 mg/dL (ref 70–99)
Potassium: 3.9 mEq/L (ref 3.5–5.1)
Sodium: 139 mEq/L (ref 135–145)

## 2013-07-17 NOTE — Progress Notes (Signed)
[name], Rowe [place], [place]  [address] Phone: [phone] Fax:  [phone]  Date:  0000000   ID:  [name] Rowe, DOB [id number], MRN [id number]  PCP:  Minerva Ends, MD  Cardiologist:  Dr. Loralie Champagne    History of Present Illness: [name] Rowe is a 53 y.o. female with a history of fibromuscular dysplasia of the renal arteries, HTN, and possible coronary vasospasm versus microvascular angina. Patient was admitted to the hospital in [place] in 2009 with severe chest pain. At that time, she had a left heart cath showing no angiographic coronary disease but the catheter did induce RCA spasm, leading to concern that her chest pain might be coronary vasospasm. She also had HTN found to be probably related to fibromuscular dysplasia of the renal arteries. She had a stent placed in the right renal artery in 2009.   She was admitted twice in 06/2011, the first time with a hypertensive urgency (she had been off her BP meds for a number of days). She was re-admitted several days later with orthostatic symptoms and probable rebound tachycardia (she had not picked up her beta blocker prescription after discharge). She was restarted on beta blocker and HCTZ was stopped.   Carotids in 2/13 showed no significant stenosis and renal artery dopplers in 2/13 showed no significant stenosis.   She was admitted again in 7/13 with atypical chest pain. She had an ETT-myoview. This was a submaximal study but showed no ischemia or infarction, EF 77%. She was in the ER with chest pain in 10/13 but was sent home after cardiac enzymes and ECG were unremarkable. She was admitted again in 5/14 with atypical chest pain, ruled out for MI, and sent home.   She has had side effects to Ranolazine and had to stop it.  She has also had side effects to Clonidine.  Last seen by Dr. Aundra Dubin 03/2013.  Patient called in recently with increased chest pain, dizziness and uncontrolled blood pressure.     She notes dizziness with standing over the past month. This predated her recent flu-like illness. Of note, she has a TSH of 78. There was some concern that her Seroquel and gabapentin were causing difficulty with absorption. She was asked to change the timing of her medication. Repeat TSH is pending in the next few weeks. She admits to not drinking enough water. She does admit to darker colored urine. She denies syncope. She does note occasional chest pains. These are sharp and only lasts seconds. She does not have time to use NTG (too brief).  She has a sensation of her heart fluttering with this. It sometimes awakens her. She does feel dyspneic with this. She denies exertional chest pain. She denies orthopnea, PND or edema.  Recent Labs: 10/15/2012: LDL (calc) 168*  05/23/2013: Hemoglobin 14.3  06/12/2013: ALT 35; Creatinine 1.2; Direct LDL 147.4; HDL Cholesterol 59.50; Potassium 3.6  06/19/2013: TSH 78.332*   Wt Readings from Last 3 Encounters:  07/17/13 136 lb (61.689 kg)  06/26/13 136 lb (61.689 kg)  06/19/13 132 lb (59.875 kg)     Past Medical History  Diagnosis Date  . Migraine headache   . Chest pain     a. No angiographic CAD by cath 2009 in [place]. There was catheter-induced RCA vasospasm versus microvascular obstruction. bCarlton Adam myoview 04/2010 - EF 79%, normal perfusion.;  c. ETT-Myoview 7/13: no ischemia, EF 77%, submax exercise  . Renal artery stenosis     a. Due  to fibromuscular dysplasia. b. Renal stent 2009. c. Studies:  CTA abdomen (1/13) with evidence for fibromuscular dysplasia, right renal artery stent appears patent. Renal artery dopplers (2/13) with FMD but no evidence for signficant stenosis.   . Hypothyroidism 2003    Graves disease s/p radio-iodine treatment in 2003   . Fibromuscular dysplasia 2009    a. Renal artery stenosis due to this. b. Carotid dopplers 07/2011 c/w FMD but no significant stenosis  . Tachycardia 2000    10/27/10-Holter -NSR with occ  sinus tachy-rare PACs, no sig arrhythmia  . Hx of echocardiogram     a. Echo (1/13): EF 65-78%, grade 1 diastolic dysfunction  . HTN (hypertension) 1988    a. Likely related to RAS; urinary metanephrines and plasma renin/aldosterone ratio normal. b. H/o HTN urgency 06/2011 after being off BP meds for a number of days  . HLD (hyperlipidemia) 2000  . Myocardial infarction 2009  . Anxiety 2001  . Depression 2001  . Hypertensive crisis 06/29/2011  . Leiomyoma   . Adenomyosis   . Fibromyalgia 1999  . Coronary vasospasm   . Chronic back pain     had 3 epidural injections on 02-14-13  . Hearing loss of left ear     s/p CVA 2006  . Personal history of colonic polyps-sessile serrated adenoma 02/23/2013  . CVA (cerebral vascular accident) 2006    lost hearing on left  . Irritable bowel syndrome   . Complication of anesthesia     has difficulty voiding after surgery  . H/O Graves' disease 2003    s/p radio-iodine treatment in 2003     Past Surgical History  Procedure Laterality Date  . Renal artery stent  2009  . Spine surgery    . Back surgery  2010 and 2011    In CT.  11/19/2008 (possibly a laminectomy) and in May of 2011 she underwent a L2-L3 fusion.  . Robotic assisted lap vaginal hysterectomy  05/13/2008    Holly hysterectomy  for myomatous uterus   . Colonoscopy    . Colonoscopy    . Cardiac catheterization  2009    states it was normal  . Vaginal hysterectomy    . Knee arthroscopy Left   . Hip surgery Bilateral 1988 and 1989    "scraped head of femur"  . Lumbar laminectomy/decompression microdiscectomy Left 05/25/2013    Procedure: LUMBAR LAMINECTOMY/DECOMPRESSION MICRODISCECTOMY 1 LEVEL;  Surgeon: Winfield Cunas, MD;  Location: [facility];  Service: Neurosurgery;  Laterality: Left;  LEFT L5S1 microdiskectomy    Current Outpatient Prescriptions  Medication Sig Dispense Refill  . aspirin EC 81 MG tablet Take 81 mg by mouth daily.      . cyclobenzaprine (FLEXERIL) 10  MG tablet Take 1 tablet (10 mg total) by mouth 3 (three) times daily as needed for muscle spasms.  45 tablet  0  . gabapentin (NEURONTIN) 600 MG tablet Take 1,200 mg by mouth at bedtime. Take two tabs at bedtime      . guaiFENesin-codeine 100-10 MG/5ML syrup Take 5 mLs by mouth every 6 (six) hours as needed for cough.  120 mL  0  . labetalol (NORMODYNE) 200 MG tablet Take 4 tablets (800 mg total) by mouth 2 (two) times daily.  720 tablet  1  . levothyroxine (SYNTHROID, LEVOTHROID) 200 MCG tablet Take 1 tablet (200 mcg total) by mouth at bedtime.  30 tablet  1  . Linaclotide (LINZESS) 290 MCG CAPS capsule Take 1 capsule (290 mcg  total) by mouth daily.  31 capsule  11  . NIFEdipine (PROCARDIA XL/ADALAT-CC) 90 MG 24 hr tablet Take 1 tablet (90 mg total) by mouth daily.  90 tablet  1  . nitroGLYCERIN (NITROSTAT) 0.4 MG SL tablet Place 1 tablet (0.4 mg total) under the tongue every 5 (five) minutes as needed. x3 doses as needed for chest pain  100 tablet  3  . pantoprazole (PROTONIX) 40 MG tablet Take 1 tablet (40 mg total) by mouth daily.  30 tablet  2  . potassium chloride SA (K-DUR,KLOR-CON) 20 MEQ tablet Take 20 mEq by mouth daily.      . QUEtiapine (SEROQUEL) 100 MG tablet Take 100 mg by mouth at bedtime. Per psychiatrist.      . rosuvastatin (CRESTOR) 40 MG tablet Take 1 tablet (40 mg total) by mouth daily.  30 tablet  3  . sertraline (ZOLOFT) 50 MG tablet Take 50 mg by mouth daily. Per psychiatrist      . spironolactone (ALDACTONE) 50 MG tablet Take 1 tablet (50 mg total) by mouth daily.  90 tablet  1   No current facility-administered medications for this visit.    Allergies:   Hydromorphone hcl; Prochlorperazine edisylate; Sumatriptan; Atorvastatin; and Tramadol   Social History:  The patient  reports that she has never smoked. She has never used smokeless tobacco. She reports that she drinks alcohol. She reports that she does not use illicit drugs.   Family History:  The patient's family  history includes Asthma in her mother; Cancer in her father; Depression in her sister; Diabetes in her maternal aunt; Early death (age of onset: 51) in her brother; Heart attack in her brother, father, and another family member; Hyperlipidemia in her brother, father, and mother; Hypertension in her brother, father, mother, and sister; Metabolic syndrome in her sister; Stroke in her father, mother, and another family member. There is no history of Colon cancer or Stomach cancer.   ROS:  Please see the history of present illness.   She denies recent fevers, chills, cough, vomiting, diarrhea, melena, hematochezia.   All other systems reviewed and negative.   PHYSICAL EXAM: VS:  BP 154/105  Pulse 99  Ht 5\' 4"  (1.626 m)  Wt 136 lb (61.689 kg)  BMI 23.33 kg/m2  Filed Vitals:   07/17/13 0835 07/17/13 0836 07/17/13 0837 07/17/13 0838  BP: 151/94 131/89 132/92 154/105  Pulse: 106 105 120 99  Height:    5\' 4"  (1.626 m)  Weight:    136 lb (61.689 kg)     Well nourished, well developed, in no acute distress HEENT: normal Neck: no JVD Cardiac:  normal S1, S2; RRR; no murmur Lungs:  clear to auscultation bilaterally, no wheezing, rhonchi or rales Abd: soft, nontender, no hepatomegaly Ext: no edema Skin: warm and dry Neuro:  CNs 2-12 intact, no focal abnormalities noted  EKG:  NSR, HR 100, normal axis     ASSESSMENT AND PLAN:  1. Dizziness: She does have fairly significant blood pressure drop from lying to standing. Her HR does increase 17 beats per minute from lying to standing. I suspect she is somewhat dehydrated resulting in orthostasis. Question if her uncontrolled hypothyroidism is also planning a role. I have asked her to push her fluids as much as possible (water). We discussed appropriate hygiene from lying or sitting to standing. I will reduce her Spironolactone to 25 mg daily for one week. She will resume 50 mg daily after one week. Check a basic metabolic  panel and CBC  today. 2. Palpitations: Etiology unclear. These are occurring almost daily. I will arrange a 48 hour Holter. Question if uncontrolled hypothyroidism is playing a role. 3. Chest Pain:  Atypical. She is not having exertional symptoms. I suspect her chest pain is related to palpitations as well as stress. 4. Hypertension:  Uncontrolled. However, she has had fairly well-controlled blood pressures in the recent past. I have given her instructions to use labetalol when necessary. Adjusts Spironolactone and hydrate as noted above. She will be brought back in close follow up. 5. Hyperlipidemia: Continue statin. 6. Depression:  She has been referred to [facility] on [address] in [place] in the past.  She does admit to increased stress from her husband. He is in prison in [place] and she is waiting for him to signed divorce papers. He sends letters almost weekly. 7. Fibromuscular dysplasia: If Creatinine is significantly elevated, consider repeat renal arterial Dopplers. 8. Disposition:  Follow up with Dr. Aundra Dubin or me in 3-4 weeks.  Signed, Richardson Dopp, PA-C  07/17/2013 8:45 AM

## 2013-07-17 NOTE — Patient Instructions (Signed)
DECREASE SPIRONOLACTONE TO 25 MG DAILY FOR 1 WEEK THEN RESUME 50 MG DAILY  LAB WORK TODAY; BMET, CBC W/DIFF  Your physician has recommended that you wear a 48 HOUR holter monitor DX DIZZINESS, PALPITATIONS. Holter monitors are medical devices that record the heart's electrical activity. Doctors most often use these monitors to diagnose arrhythmias. Arrhythmias are problems with the speed or rhythm of the heartbeat. The monitor is a small, portable device. You can wear one while you do your normal daily activities. This is usually used to diagnose what is causing palpitations/syncope (passing out).  PUSH FLUID (WATER) AS MUCH AS POSSIBLE STAND SLOWLY; BEFORE STANDING PUMP YOUR CALF MUSCLES FIRST TO GET CIRCULATION MOVING  IF YOUR SYSTOLIC BLOOD PRESSURE (TOP NUMBER) IS ABOVE 294 OR IF DIASTOLIC BLOOD PRESSURE (BOTTOM NUMBER) IS ABOVE 110 THEN TAKE EXTRA LABETALOL 200 MG ONE TIME.  PLEASE FOLLOW UP WITH DR. Aundra Dubin IN 3-4 WEEKS IF NOT THEN WITH SCOTT WEAVER, PAC SAME DAY DR. Aundra Dubin IS IN THE OFFICE

## 2013-07-18 ENCOUNTER — Telehealth: Payer: Self-pay | Admitting: *Deleted

## 2013-07-18 NOTE — Telephone Encounter (Signed)
pt notified about lab results with verbal understanding  

## 2013-07-20 ENCOUNTER — Encounter (INDEPENDENT_AMBULATORY_CARE_PROVIDER_SITE_OTHER): Payer: Medicaid Other

## 2013-07-20 ENCOUNTER — Encounter: Payer: Self-pay | Admitting: Radiology

## 2013-07-20 DIAGNOSIS — R42 Dizziness and giddiness: Secondary | ICD-10-CM

## 2013-07-20 DIAGNOSIS — R002 Palpitations: Secondary | ICD-10-CM

## 2013-07-20 NOTE — Progress Notes (Signed)
Patient ID: [name] Rowe, female   DOB: 05-31-1961, 54 y.o.   MRN: [id number] Evo 48hr holter applied

## 2013-07-23 ENCOUNTER — Telehealth: Payer: Self-pay | Admitting: *Deleted

## 2013-07-23 NOTE — Telephone Encounter (Signed)
lmom that time for appt with Brynda Rim. PA has been moved to 08/14/13 9:50 from the 8:30 time slot due provider will be out of the office until 9:10 that day.

## 2013-07-23 NOTE — Telephone Encounter (Signed)
lmptcb about appt time rsc to 9:50 due to [place] will be out of the office at the original appt time for the pt.

## 2013-08-06 ENCOUNTER — Telehealth: Payer: Self-pay | Admitting: *Deleted

## 2013-08-06 NOTE — Telephone Encounter (Signed)
Dr Aundra Dubin reviewed monitor done 07/26/13--no significant arrhythmias. Pt notified

## 2013-08-07 ENCOUNTER — Encounter: Payer: Self-pay | Admitting: Family Medicine

## 2013-08-07 ENCOUNTER — Ambulatory Visit (INDEPENDENT_AMBULATORY_CARE_PROVIDER_SITE_OTHER): Payer: Medicaid Other | Admitting: Family Medicine

## 2013-08-07 VITALS — BP 124/90 | HR 86 | Temp 98.6°F | Ht 64.0 in | Wt 134.0 lb

## 2013-08-07 DIAGNOSIS — M549 Dorsalgia, unspecified: Secondary | ICD-10-CM

## 2013-08-07 DIAGNOSIS — R51 Headache: Secondary | ICD-10-CM

## 2013-08-07 DIAGNOSIS — R531 Weakness: Secondary | ICD-10-CM | POA: Insufficient documentation

## 2013-08-07 DIAGNOSIS — M6281 Muscle weakness (generalized): Secondary | ICD-10-CM

## 2013-08-07 DIAGNOSIS — R519 Headache, unspecified: Secondary | ICD-10-CM | POA: Insufficient documentation

## 2013-08-07 LAB — POCT URINALYSIS DIPSTICK
Bilirubin, UA: NEGATIVE
Glucose, UA: NEGATIVE
Nitrite, UA: NEGATIVE
PH UA: 6
PROTEIN UA: 30
RBC UA: NEGATIVE
UROBILINOGEN UA: 1

## 2013-08-07 LAB — POCT UA - MICROSCOPIC ONLY

## 2013-08-07 LAB — POCT SEDIMENTATION RATE: POCT SED RATE: 17 mm/hr (ref 0–22)

## 2013-08-07 NOTE — Progress Notes (Signed)
   Subjective:    Patient ID: [name] Rowe, female    DOB: 28-Jun-1960, 53 y.o.   MRN: [id number]  HPI 53 yo F with history of chronic low back pain related to previous lumbar surgeries and intervertebral disc protrusion at L5-S1 s/p lumbar laminectomy/decompression microdiscectomy L5/S1 on left on 05/25/13  presents for SD visit:  1.  L sided pain and weakness: started in the evening of Thursday 08/02/13. associated with double vision that improved with covering her L eye and L eye blurriness. Symptoms started at rest. Blurred vision has improved a bit. She now has occipital headaches and pain that radiates from the back of her head to her L  Arm, down back and to her L leg. She has gabapentin only for pain. She admits to increased stress.   She did follow up with her surgeon post op and reports that she was told she was doing well and dismissed to f/u prn.    Review of Systems As per HPI  Endorses intermittent dysuria Denies fever, chills,     Objective:   Physical Exam BP 124/90  Pulse 86  Temp(Src) 98.6 F (37 C) (Oral)  Ht 5\' 4"  (1.626 m)  Wt 134 lb (60.782 kg)  BMI 22.99 kg/m2 General appearance: alert, appears stated age and no distress Face: mild fullness along left eye compared to R. No tenderness of L temple or palpable pulsations.  Eyes: conjunctivae/corneas clear. PERRL, EOM's intact. Fundi benign. no nystagmus or eye deviation when covered.  Neurologic: Mental status: Alert, oriented, thought content appropriate Cranial nerves: normal Sensory: normal Motor: normal on R, decreased on L  Reflexes: 2+ and symmetric Coordination: normal Gait: Antalgic Back Exam: Back: Normal Curvature, no deformities. L CVA tenderness  Paraspinal Tenderness: present on L   LE Strength 5/5 on R. 3/5 on L.  LE Sensation: in tact  LE Reflexes 2+ and symmetric  Straight leg raise: negative b/l  No LE edema.   Lab Results  Component Value Date   POCTSEDRATE 17 08/07/2013   Urine  dipstick shows positive for trace RBC's.  Micro exam: 15-20 epithelial cells, 1-5 WBC's per HPF, 0 RBC's per HPF, 3+ rods,  and calcium oxalte crystals seen.  Visual acuity: 20/30 bilaterally      Assessment & Plan:   Patient treated in office with depo medrol 40 IM x one pending sed rate.

## 2013-08-07 NOTE — Patient Instructions (Addendum)
Thank you for coming in today. It will take another 30 minutes or so for the sed rate to return. Your exam is concerning for possibly another stroke.  I have ordered MRI to evaluate this.   In the meantime I would like to treat you with a steroid while waiting for the sed rate. This will cover the condition called temporal arteritis and help with back pain.  Please plan to see me again on Friday.   Dr. Adrian Blackwater

## 2013-08-08 ENCOUNTER — Other Ambulatory Visit: Payer: Self-pay | Admitting: Family Medicine

## 2013-08-08 ENCOUNTER — Telehealth: Payer: Self-pay | Admitting: Family Medicine

## 2013-08-08 MED ORDER — LEVOTHYROXINE SODIUM 200 MCG PO TABS: 200.0000 ug | ORAL_TABLET | Freq: Every day | ORAL | Status: DC

## 2013-08-08 MED ORDER — IBUPROFEN 600 MG PO TABS: 600.0000 mg | ORAL_TABLET | Freq: Three times a day (TID) | ORAL | Status: DC | PRN

## 2013-08-08 NOTE — Telephone Encounter (Signed)
Called patient. Normal sed rate. She is still having pain. Worse at L side/hip. Taking gabapentin, flexeril.  She is taking 200 mcg of synthroid.  Plan: rf synthroid per request Add ibuprofen 600 mg TID with food x 5 days  MRI tomorrow F/u with me in office in 2 days

## 2013-08-08 NOTE — Assessment & Plan Note (Signed)
A: 53 yo F with hx of CVA and L hearing loss, fibromyalgia, anxieyt, hypothyroidism with L sided pain and weakness, headache, blurred vision in L eye. Differentials include recurrent CVA, temporal arteritis, upper urinary tract infection. Normal UA. Normal sed rate.  P: MRI head ordered Reviewed s/s to prompt patient to seek immediate medical attention F/u with me in office in 3 days tsh at fu.

## 2013-08-09 ENCOUNTER — Other Ambulatory Visit: Payer: Self-pay | Admitting: Family Medicine

## 2013-08-09 ENCOUNTER — Ambulatory Visit (HOSPITAL_COMMUNITY)
Admission: RE | Admit: 2013-08-09 | Discharge: 2013-08-09 | Disposition: A | Discharge status: Home / Self Care | Payer: Medicaid Other | Source: Ambulatory Visit | Attending: Family Medicine | Admitting: Family Medicine

## 2013-08-09 DIAGNOSIS — I6350 Cerebral infarction due to unspecified occlusion or stenosis of unspecified cerebral artery: Secondary | ICD-10-CM | POA: Insufficient documentation

## 2013-08-09 DIAGNOSIS — R29898 Other symptoms and signs involving the musculoskeletal system: Secondary | ICD-10-CM | POA: Insufficient documentation

## 2013-08-09 DIAGNOSIS — R531 Weakness: Secondary | ICD-10-CM

## 2013-08-10 ENCOUNTER — Encounter: Payer: Self-pay | Admitting: Family Medicine

## 2013-08-10 ENCOUNTER — Ambulatory Visit (INDEPENDENT_AMBULATORY_CARE_PROVIDER_SITE_OTHER): Payer: Medicaid Other | Admitting: Family Medicine

## 2013-08-10 ENCOUNTER — Telehealth: Payer: Self-pay | Admitting: Family Medicine

## 2013-08-10 VITALS — BP 132/84 | HR 88 | Ht 64.0 in | Wt 137.0 lb

## 2013-08-10 DIAGNOSIS — M545 Low back pain, unspecified: Secondary | ICD-10-CM

## 2013-08-10 DIAGNOSIS — E559 Vitamin D deficiency, unspecified: Secondary | ICD-10-CM

## 2013-08-10 DIAGNOSIS — E039 Hypothyroidism, unspecified: Secondary | ICD-10-CM

## 2013-08-10 MED ORDER — HYDROCODONE-ACETAMINOPHEN 5-325 MG PO TABS: 1.0000 | ORAL_TABLET | Freq: Four times a day (QID) | ORAL | Status: DC | PRN

## 2013-08-10 MED ORDER — PREDNISONE 20 MG PO TABS: 40.0000 mg | ORAL_TABLET | Freq: Every day | ORAL | Status: AC

## 2013-08-10 NOTE — Patient Instructions (Signed)
Mrs. Glenna Durand,  Thank you for coming in today. Your MRI/MRA of head was negative for a new stroke. Please stop ibuprofen Starting tomorrow Am take prednisone 40 mg with breakfast for the next 5 days. Starting tonight take vicodin 1-2 tabs as needed for severe pain.   Please call and schedule f/u with Dr. Ashok Pall.  Please plan to see me in f/u in 1 week.  Call and come in sooner if needed.   Dr. Adrian Blackwater

## 2013-08-10 NOTE — Telephone Encounter (Signed)
Called patient Asked that she go to hospital on Monday for x-ray of low back.  She will.

## 2013-08-10 NOTE — Assessment & Plan Note (Signed)
A: acute L sided low back pain with L sided UE and LE weakness. Concerning for herniated disk  P: Your MRI/MRA of head was negative for a new stroke. Please stop ibuprofen Starting tomorrow Am take prednisone 40 mg with breakfast for the next 5 days. Starting tonight take vicodin 1-2 tabs as needed for severe pain.  Lumbar x-ray ordered to rule out compression fracture   Please call and schedule f/u with Dr. Ashok Pall.  Please plan to see me in f/u in 1 week.  Call and come in sooner if needed.

## 2013-08-10 NOTE — Progress Notes (Signed)
   Subjective:    Patient ID: [name] Rowe, female    DOB: 1961/05/19, 53 y.o.   MRN: [id number]  HPI 53 yo F presents for f/u at my request  of MRI and low back pain:  1. L sided weakness: improved. Reviewed MRI, no acute CVA.  2. L sided low back pain: acute worsening following fall from standing and stumbling into her bedroom while at home on the morning of  08/02/13. Pain persist. Pain from L4 to behind knee. Pain is worse than before surgery. Admits to L groin numbness when she crosses her legs. Denies fecal and urinary incontinence. NSAID has not helped. Gabapentin is not helping.   Soc Hx: non smoker  Review of Systems As per HPI     Objective:   Physical Exam BP 132/84  Pulse 88  Ht 5\' 4"  (1.626 m)  Wt 137 lb (62.143 kg)  BMI 23.50 kg/m2 General appearance: alert, cooperative and no distress Back Exam: Back: Normal Curvature, no deformities or CVA tenderness  Paraspinal Tenderness: L4 on L sided down to iliac crest (most tender) to gluteal area and behind knee.   LE Strength 4/5  LE Sensation: in tact  Straight leg raise: negative     Assessment & Plan:

## 2013-08-11 LAB — TSH: TSH: 3.219 u[IU]/mL (ref 0.350–4.500)

## 2013-08-11 LAB — VITAMIN D 25 HYDROXY (VIT D DEFICIENCY, FRACTURES): VIT D 25 HYDROXY: 26 ng/mL — AB (ref 30–89)

## 2013-08-13 ENCOUNTER — Ambulatory Visit (HOSPITAL_COMMUNITY)
Admission: RE | Admit: 2013-08-13 | Discharge: 2013-08-13 | Disposition: A | Discharge status: Home / Self Care | Payer: Medicaid Other | Source: Ambulatory Visit | Attending: Family Medicine | Admitting: Family Medicine

## 2013-08-13 DIAGNOSIS — M549 Dorsalgia, unspecified: Secondary | ICD-10-CM | POA: Insufficient documentation

## 2013-08-13 DIAGNOSIS — M545 Low back pain, unspecified: Secondary | ICD-10-CM

## 2013-08-13 DIAGNOSIS — Z981 Arthrodesis status: Secondary | ICD-10-CM | POA: Insufficient documentation

## 2013-08-14 ENCOUNTER — Ambulatory Visit (INDEPENDENT_AMBULATORY_CARE_PROVIDER_SITE_OTHER): Payer: Medicaid Other | Admitting: Physician Assistant

## 2013-08-14 ENCOUNTER — Ambulatory Visit: Payer: Medicaid Other | Admitting: Physician Assistant

## 2013-08-14 ENCOUNTER — Encounter: Payer: Self-pay | Admitting: Physician Assistant

## 2013-08-14 ENCOUNTER — Encounter: Payer: Self-pay | Admitting: *Deleted

## 2013-08-14 VITALS — BP 120/80 | HR 84 | Ht 64.0 in | Wt 131.0 lb

## 2013-08-14 DIAGNOSIS — R42 Dizziness and giddiness: Secondary | ICD-10-CM

## 2013-08-14 DIAGNOSIS — R079 Chest pain, unspecified: Secondary | ICD-10-CM

## 2013-08-14 DIAGNOSIS — R002 Palpitations: Secondary | ICD-10-CM

## 2013-08-14 DIAGNOSIS — F3289 Other specified depressive episodes: Secondary | ICD-10-CM

## 2013-08-14 DIAGNOSIS — E785 Hyperlipidemia, unspecified: Secondary | ICD-10-CM

## 2013-08-14 DIAGNOSIS — F329 Major depressive disorder, single episode, unspecified: Secondary | ICD-10-CM

## 2013-08-14 DIAGNOSIS — I10 Essential (primary) hypertension: Secondary | ICD-10-CM

## 2013-08-14 NOTE — Progress Notes (Signed)
[place], [place], [place]  [address] Phone: [phone] Fax:  [phone]  Date:  07/07/8655   ID:  [name] Rowe, DOB [birth year], MRN [id number]  PCP:  Minerva Ends, MD  Cardiologist:  Dr. Loralie Champagne    History of Present Illness: [name] Rowe is a 53 y.o. female with a history of fibromuscular dysplasia of the renal arteries, HTN, and possible coronary vasospasm versus microvascular angina. Patient was admitted to the hospital in [place] in 2009 with severe chest pain. At that time, she had a left heart cath showing no angiographic coronary disease but the catheter did induce RCA spasm, leading to concern that her chest pain might be coronary vasospasm. She also had HTN found to be probably related to fibromuscular dysplasia of the renal arteries. She had a stent placed in the right renal artery in 2009.   She was admitted twice in 06/2011, the first time with a hypertensive urgency (she had been off her BP meds for a number of days). She was re-admitted several days later with orthostatic symptoms and probable rebound tachycardia (she had not picked up her beta blocker prescription after discharge). She was restarted on beta blocker and HCTZ was stopped.   Carotids in 2/13 showed no significant stenosis and renal artery dopplers in 2/13 showed no significant stenosis.   She was admitted again in 7/13 with atypical chest pain. She had an ETT-myoview. This was a submaximal study but showed no ischemia or infarction, EF 77%. She was in the ER with chest pain in 10/13 but was sent home after cardiac enzymes and ECG were unremarkable. She was admitted again in 5/14 with atypical chest pain, ruled out for MI, and sent home.   She has had side effects to Ranolazine and had to stop it.  She has also had side effects to Clonidine.    I saw her last month with complaints of dizziness. She did have a significant blood pressure drop from lying to standing. I reduced  her diuretics for several days. She also had palpitations. Holter monitor was negative for significant arrhythmias. Since last seen, she continues to have increased stress. She also has recurrent low back pain and is being referred back to neurosurgery. She's had difficulty with losing balance and falling to her left. Recent MRI performed by her PCP demonstrated no acute findings. She continues to have chronic chest pain without significant change. She denies dyspnea or syncope.  Recent Labs: 10/15/2012: LDL (calc) 168*  06/12/2013: ALT 35; Direct LDL 147.4; HDL Cholesterol 59.50  07/17/2013: Creatinine 1.0; Hemoglobin 13.5; Potassium 3.9  08/10/2013: TSH 3.219   Wt Readings from Last 3 Encounters:  08/14/13 131 lb (59.421 kg)  08/10/13 137 lb (62.143 kg)  08/07/13 134 lb (60.782 kg)     Past Medical History  Diagnosis Date  . Migraine headache   . Chest pain     a. No angiographic CAD by cath 2009 in [place]. There was catheter-induced RCA vasospasm versus microvascular obstruction. bCarlton Adam myoview 04/2010 - EF 79%, normal perfusion.;  c. ETT-Myoview 7/13: no ischemia, EF 77%, submax exercise  . Renal artery stenosis     a. Due to fibromuscular dysplasia. b. Renal stent 2009. c. Studies:  CTA abdomen (1/13) with evidence for fibromuscular dysplasia, right renal artery stent appears patent. Renal artery dopplers (2/13) with FMD but no evidence for signficant stenosis.   . Hypothyroidism 2003    Graves disease s/p radio-iodine treatment in 2003   .  Fibromuscular dysplasia 2009    a. Renal artery stenosis due to this. b. Carotid dopplers 07/2011 c/w FMD but no significant stenosis  . Tachycardia 2000    10/27/10-Holter -NSR with occ sinus tachy-rare PACs, no sig arrhythmia  . Hx of echocardiogram     a. Echo (1/13): EF 73-42%, grade 1 diastolic dysfunction  . HTN (hypertension) 1988    a. Likely related to RAS; urinary metanephrines and plasma renin/aldosterone ratio normal. b. H/o  HTN urgency 06/2011 after being off BP meds for a number of days  . HLD (hyperlipidemia) 2000  . Myocardial infarction 2009  . Anxiety 2001  . Depression 2001  . Hypertensive crisis 06/29/2011  . Leiomyoma   . Adenomyosis   . Fibromyalgia 1999  . Coronary vasospasm   . Chronic back pain     had 3 epidural injections on 02-14-13  . Hearing loss of left ear     s/p CVA 2006  . Personal history of colonic polyps-sessile serrated adenoma 02/23/2013  . CVA (cerebral vascular accident) 2006    lost hearing on left  . Irritable bowel syndrome   . Complication of anesthesia     has difficulty voiding after surgery  . H/O Graves' disease 2003    s/p radio-iodine treatment in 2003     Past Surgical History  Procedure Laterality Date  . Renal artery stent  2009  . Spine surgery    . Back surgery  2010 and 2011    In CT.  11/19/2008 (possibly a laminectomy) and in May of 2011 she underwent a L2-L3 fusion.  . Robotic assisted lap vaginal hysterectomy  05/13/2008    [place] hysterectomy  for myomatous uterus   . Colonoscopy    . Colonoscopy    . Cardiac catheterization  2009    states it was normal  . Vaginal hysterectomy    . Knee arthroscopy Left   . Hip surgery Bilateral 1988 and 1989    "scraped head of femur"  . Lumbar laminectomy/decompression microdiscectomy Left 05/25/2013    Procedure: LUMBAR LAMINECTOMY/DECOMPRESSION MICRODISCECTOMY 1 LEVEL;  Surgeon: Winfield Cunas, MD;  Location: [facility];  Service: Neurosurgery;  Laterality: Left;  LEFT L5S1 microdiskectomy    Current Outpatient Prescriptions  Medication Sig Dispense Refill  . aspirin EC 81 MG tablet Take 81 mg by mouth daily.      . cyclobenzaprine (FLEXERIL) 10 MG tablet Take 1 tablet (10 mg total) by mouth 3 (three) times daily as needed for muscle spasms.  45 tablet  0  . gabapentin (NEURONTIN) 600 MG tablet Take 1,200 mg by mouth at bedtime. Take two tabs at bedtime      . HYDROcodone-acetaminophen  (NORCO/VICODIN) 5-325 MG per tablet Take 1-2 tablets by mouth every 6 (six) hours as needed for severe pain.  20 tablet  0  . labetalol (NORMODYNE) 200 MG tablet Take 4 tablets (800 mg total) by mouth 2 (two) times daily.  720 tablet  1  . levothyroxine (SYNTHROID, LEVOTHROID) 200 MCG tablet Take 1 tablet (200 mcg total) by mouth at bedtime.  30 tablet  0  . Linaclotide (LINZESS) 290 MCG CAPS capsule Take 1 capsule (290 mcg total) by mouth daily.  31 capsule  11  . NIFEdipine (PROCARDIA XL/ADALAT-CC) 90 MG 24 hr tablet Take 1 tablet (90 mg total) by mouth daily.  90 tablet  1  . nitroGLYCERIN (NITROSTAT) 0.4 MG SL tablet Place 1 tablet (0.4 mg total) under the tongue every  5 (five) minutes as needed. x3 doses as needed for chest pain  100 tablet  3  . pantoprazole (PROTONIX) 40 MG tablet Take 1 tablet (40 mg total) by mouth daily.  30 tablet  2  . potassium chloride SA (K-DUR,KLOR-CON) 20 MEQ tablet Take 20 mEq by mouth daily.      . predniSONE (DELTASONE) 20 MG tablet Take 2 tablets (40 mg total) by mouth daily with breakfast.  10 tablet  0  . QUEtiapine (SEROQUEL) 100 MG tablet Take 100 mg by mouth at bedtime. Per psychiatrist.      . rosuvastatin (CRESTOR) 40 MG tablet Take 1 tablet (40 mg total) by mouth daily.  30 tablet  3  . sertraline (ZOLOFT) 50 MG tablet Take 50 mg by mouth daily. Per psychiatrist      . spironolactone (ALDACTONE) 50 MG tablet Take 1 tablet (50 mg total) by mouth daily.  90 tablet  1   No current facility-administered medications for this visit.    Allergies:   Hydromorphone hcl; Prochlorperazine edisylate; Sumatriptan; Atorvastatin; and Tramadol   Social History:  The patient  reports that she has never smoked. She has never used smokeless tobacco. She reports that she drinks alcohol. She reports that she does not use illicit drugs.   Family History:  The patient's family history includes Asthma in her mother; Cancer in her father; Depression in her sister; Diabetes  in her maternal aunt; Early death (age of onset: 51) in her brother; Heart attack in her brother, father, and another family member; Hyperlipidemia in her brother, father, and mother; Hypertension in her brother, father, mother, and sister; Metabolic syndrome in her sister; Stroke in her father, mother, and another family member. There is no history of Colon cancer or Stomach cancer.   ROS:  Please see the history of present illness.    All other systems reviewed and negative.   PHYSICAL EXAM: VS:  BP 120/80  Pulse 84  Ht 5\' 4"  (1.626 m)  Wt 131 lb (59.421 kg)  BMI 22.47 kg/m2  Well nourished, well developed, in no acute distress HEENT: normal Neck: no JVD Cardiac:  normal S1, S2; RRR; no murmur Lungs:  clear to auscultation bilaterally, no wheezing, rhonchi or rales Abd: soft, nontender, no hepatomegaly Ext: no edema Skin: warm and dry Neuro:  CNs 2-12 intact, no focal abnormalities noted  EKG:  NSR, HR 84, normal axis, no acute changes     ASSESSMENT AND PLAN:  1. Dizziness:  She is now having more of a loss of balance. Recent MRI was performed by her PCP and demonstrated no significant findings. Continue follow up with primary care. 2. Palpitations:  Recent Holter without significant arrhythmia. Continue current therapy. 3. Chest Pain:  She has chronic chest pain without significant change. Continue current therapy. 4. Hypertension:  Her blood pressure is much better controlled. Continue current therapy. 5. Hyperlipidemia: Continue statin. 6. Depression:  Continue followup with psychiatry. 7. Disposition:  Follow up with Dr. Aundra Dubin in 3 months.  Signed, Richardson Dopp, PA-C  08/14/2013 10:06 AM

## 2013-08-17 ENCOUNTER — Ambulatory Visit (INDEPENDENT_AMBULATORY_CARE_PROVIDER_SITE_OTHER): Payer: Medicaid Other | Admitting: Family Medicine

## 2013-08-17 ENCOUNTER — Encounter: Payer: Self-pay | Admitting: Family Medicine

## 2013-08-17 VITALS — BP 132/82 | HR 103 | Temp 99.0°F | Ht 64.0 in | Wt 134.0 lb

## 2013-08-17 DIAGNOSIS — I10 Essential (primary) hypertension: Secondary | ICD-10-CM

## 2013-08-17 DIAGNOSIS — H43399 Other vitreous opacities, unspecified eye: Secondary | ICD-10-CM

## 2013-08-17 DIAGNOSIS — H43391 Other vitreous opacities, right eye: Secondary | ICD-10-CM

## 2013-08-17 DIAGNOSIS — M545 Low back pain, unspecified: Secondary | ICD-10-CM

## 2013-08-17 DIAGNOSIS — H532 Diplopia: Secondary | ICD-10-CM

## 2013-08-17 MED ORDER — HYDROCODONE-ACETAMINOPHEN 5-325 MG PO TABS: 1.0000 | ORAL_TABLET | Freq: Four times a day (QID) | ORAL | Status: DC | PRN

## 2013-08-17 NOTE — Assessment & Plan Note (Signed)
A: vision changes in the setting of labile BP 144/102 initially, rechecked to 138/82.  P: amb referral to opthalmology.  BP control. No medication changes today due to goal BP upon recheck.

## 2013-08-17 NOTE — Progress Notes (Signed)
   Subjective:    Patient ID: [name] Rowe, female    DOB: 14-Feb-1961, 53 y.o.   MRN: [id number]  HPI 53 yo F presents for f/u visit:  1. L back pain: persistent. Patient has completed prednisone course 40 mg daily x 5 days. She still has L low back pain. Pain still radiates to posterior knee. She went for lumbar x-ray, stable with post op changes. No fever, chills, urinary or fecal incontinence.  2. Vision changes: scotoma in R eye since 08/02/13, failed to mention this before. Double vision in L eye since same time. No significant headache, nausea, or emesis.    Review of Systems     Objective:   Physical Exam BP 132/82  Pulse 103  Temp(Src) 99 F (37.2 C) (Oral)  Ht 5\' 4"  (1.626 m)  Wt 134 lb (60.782 kg)  BMI 22.99 kg/m2 BP 132/82  Pulse 103  Temp(Src) 99 F (37.2 C) (Oral)  Ht 5\' 4"  (1.626 m)  Wt 134 lb (60.782 kg)  BMI 22.99 kg/m2 General appearance: alert, cooperative and no distress Ears: normal TM's and external ear canals both ears Back: negative seated straight-leg b/l.      Assessment & Plan:

## 2013-08-17 NOTE — Patient Instructions (Signed)
[name],  Thank you for coming in today.  I have refilled pain medication, 10 tabs until you can see Dr. Christella Noa. Please continue other medications.   Please do home stretches and ice 2-3 times daily as well.  I have placed a referral to ophthalmology which my staff will work on scheduling, we will let you know if we are successful.  TSH is normal! We will stay at the current dose.  Lab Results  Component Value Date   TSH 3.219 08/10/2013     Dr. Adrian Blackwater

## 2013-08-17 NOTE — Assessment & Plan Note (Signed)
A: acute on chronic low back pain. Stable. P: Refilled pain medication for a few more pills Patient has f/u with spine surgeon.

## 2013-08-22 ENCOUNTER — Other Ambulatory Visit: Payer: Self-pay | Admitting: Neurosurgery

## 2013-08-22 DIAGNOSIS — M5126 Other intervertebral disc displacement, lumbar region: Secondary | ICD-10-CM

## 2013-08-31 ENCOUNTER — Ambulatory Visit
Admission: RE | Admit: 2013-08-31 | Discharge: 2013-08-31 | Disposition: A | Discharge status: Home / Self Care | Payer: Medicaid Other | Source: Ambulatory Visit | Attending: Neurosurgery | Admitting: Neurosurgery

## 2013-08-31 DIAGNOSIS — M5126 Other intervertebral disc displacement, lumbar region: Secondary | ICD-10-CM

## 2013-08-31 MED ORDER — GADOBENATE DIMEGLUMINE 529 MG/ML IV SOLN
12.0000 mL | Freq: Once | INTRAVENOUS | Status: AC | PRN
  Administered 2013-08-31: 12 mL via INTRAVENOUS

## 2013-09-04 ENCOUNTER — Telehealth: Payer: Self-pay | Admitting: *Deleted

## 2013-09-04 DIAGNOSIS — M519 Unspecified thoracic, thoracolumbar and lumbosacral intervertebral disc disorder: Secondary | ICD-10-CM

## 2013-09-04 NOTE — Telephone Encounter (Signed)
Caren Griffins from Dr. Lacy Duverney office called and they would like to refer this patient to Dr. Lorre Nick, ortho.  Pt has CA MCD and needs [place], the PCP to place referral.  Caren Griffins will fax [place] the OV note as well.  Will FWD to MD for ortho referral.  Lazaro Arms, CMA

## 2013-09-04 NOTE — Telephone Encounter (Signed)
Referral placed.

## 2013-09-19 DIAGNOSIS — M25559 Pain in unspecified hip: Secondary | ICD-10-CM | POA: Insufficient documentation

## 2013-09-28 ENCOUNTER — Emergency Department (HOSPITAL_COMMUNITY)
Admission: EM | Admit: 2013-09-28 | Discharge: 2013-09-28 | Disposition: A | Discharge status: Home / Self Care | Payer: Medicaid Other | Attending: Emergency Medicine | Admitting: Emergency Medicine

## 2013-09-28 ENCOUNTER — Encounter (HOSPITAL_COMMUNITY): Payer: Self-pay | Admitting: Emergency Medicine

## 2013-09-28 DIAGNOSIS — I201 Angina pectoris with documented spasm: Secondary | ICD-10-CM | POA: Insufficient documentation

## 2013-09-28 DIAGNOSIS — K589 Irritable bowel syndrome without diarrhea: Secondary | ICD-10-CM | POA: Insufficient documentation

## 2013-09-28 DIAGNOSIS — IMO0001 Reserved for inherently not codable concepts without codable children: Secondary | ICD-10-CM | POA: Insufficient documentation

## 2013-09-28 DIAGNOSIS — F411 Generalized anxiety disorder: Secondary | ICD-10-CM | POA: Insufficient documentation

## 2013-09-28 DIAGNOSIS — E785 Hyperlipidemia, unspecified: Secondary | ICD-10-CM | POA: Insufficient documentation

## 2013-09-28 DIAGNOSIS — Z79899 Other long term (current) drug therapy: Secondary | ICD-10-CM | POA: Insufficient documentation

## 2013-09-28 DIAGNOSIS — Z7982 Long term (current) use of aspirin: Secondary | ICD-10-CM | POA: Insufficient documentation

## 2013-09-28 DIAGNOSIS — Z860100 Personal history of colon polyps, unspecified: Secondary | ICD-10-CM | POA: Insufficient documentation

## 2013-09-28 DIAGNOSIS — F329 Major depressive disorder, single episode, unspecified: Secondary | ICD-10-CM | POA: Insufficient documentation

## 2013-09-28 DIAGNOSIS — G43909 Migraine, unspecified, not intractable, without status migrainosus: Secondary | ICD-10-CM | POA: Insufficient documentation

## 2013-09-28 DIAGNOSIS — E039 Hypothyroidism, unspecified: Secondary | ICD-10-CM | POA: Insufficient documentation

## 2013-09-28 DIAGNOSIS — I252 Old myocardial infarction: Secondary | ICD-10-CM | POA: Insufficient documentation

## 2013-09-28 DIAGNOSIS — H532 Diplopia: Secondary | ICD-10-CM | POA: Insufficient documentation

## 2013-09-28 DIAGNOSIS — H9190 Unspecified hearing loss, unspecified ear: Secondary | ICD-10-CM | POA: Insufficient documentation

## 2013-09-28 DIAGNOSIS — Z9889 Other specified postprocedural states: Secondary | ICD-10-CM | POA: Insufficient documentation

## 2013-09-28 DIAGNOSIS — I10 Essential (primary) hypertension: Secondary | ICD-10-CM | POA: Insufficient documentation

## 2013-09-28 DIAGNOSIS — I69998 Other sequelae following unspecified cerebrovascular disease: Secondary | ICD-10-CM | POA: Insufficient documentation

## 2013-09-28 DIAGNOSIS — F3289 Other specified depressive episodes: Secondary | ICD-10-CM | POA: Insufficient documentation

## 2013-09-28 DIAGNOSIS — H81399 Other peripheral vertigo, unspecified ear: Secondary | ICD-10-CM | POA: Insufficient documentation

## 2013-09-28 DIAGNOSIS — G8929 Other chronic pain: Secondary | ICD-10-CM | POA: Insufficient documentation

## 2013-09-28 DIAGNOSIS — M6281 Muscle weakness (generalized): Secondary | ICD-10-CM | POA: Insufficient documentation

## 2013-09-28 DIAGNOSIS — H81391 Other peripheral vertigo, right ear: Secondary | ICD-10-CM

## 2013-09-28 DIAGNOSIS — Z8742 Personal history of other diseases of the female genital tract: Secondary | ICD-10-CM | POA: Insufficient documentation

## 2013-09-28 MED ORDER — DIPHENHYDRAMINE HCL 50 MG/ML IJ SOLN
25.0000 mg | Freq: Once | INTRAMUSCULAR | Status: AC
  Administered 2013-09-28: 25 mg via INTRAVENOUS
  Filled 2013-09-28: qty 1

## 2013-09-28 MED ORDER — MECLIZINE HCL 25 MG PO TABS: 25.0000 mg | ORAL_TABLET | Freq: Three times a day (TID) | ORAL | Status: DC | PRN

## 2013-09-28 MED ORDER — DIAZEPAM 5 MG/ML IJ SOLN
2.5000 mg | Freq: Once | INTRAMUSCULAR | Status: AC
  Administered 2013-09-28: 2.5 mg via INTRAVENOUS
  Filled 2013-09-28: qty 2

## 2013-09-28 NOTE — ED Notes (Signed)
Per EMS report: pt from home: pt began to feel dizzy in which the pt began to feel like the room was spinning and lightheaded.  Pt reports seeing double.  Pt's symptoms caused pt to have nausea.  EMS observed pt vomiting x 1.  EMS gave pt 4mg  zofran IV. EMS: 150/100, HR: 80, RR: 18. Pt a/o x 4.

## 2013-09-28 NOTE — ED Provider Notes (Signed)
CSN: [id number]     Arrival date & time 09/28/13  6222 History   First MD Initiated Contact with Patient 09/28/13 0246     Chief Complaint  Patient presents with  . Dizziness     (Consider location/radiation/quality/duration/timing/severity/associated sxs/prior Treatment) HPI This is a 53 year old female with a history of a stroke that left her deaf in left ear and with persistent left lower extremity weakness. She is here with dizziness that woke her from sleep about an hour prior to arrival. She describes the dizziness as a combination of lightheadedness and seeing double. She denies a sensation of the room spinning. She did have some tinnitus in the right ear earlier that is resolved. She describes the tinnitus is the sound of air blowing. The dizziness has been accompanied by nausea and she did vomit one time in the ambulance prior to arrival. She was given Zofran IV just prior to arrival.   Past Medical History  Diagnosis Date  . Migraine headache   . Chest pain     a. No angiographic CAD by cath 2009 in [place]. There was catheter-induced RCA vasospasm versus microvascular obstruction. bCarlton Adam myoview 04/2010 - EF 79%, normal perfusion.;  c. ETT-Myoview 7/13: no ischemia, EF 77%, submax exercise  . Renal artery stenosis     a. Due to fibromuscular dysplasia. b. Renal stent 2009. c. Studies:  CTA abdomen (1/13) with evidence for fibromuscular dysplasia, right renal artery stent appears patent. Renal artery dopplers (2/13) with FMD but no evidence for signficant stenosis.   . Hypothyroidism 2003    Graves disease s/p radio-iodine treatment in 2003   . Fibromuscular dysplasia 2009    a. Renal artery stenosis due to this. b. Carotid dopplers 07/2011 c/w FMD but no significant stenosis  . Tachycardia 2000    10/27/10-Holter -NSR with occ sinus tachy-rare PACs, no sig arrhythmia  . Hx of echocardiogram     a. Echo (1/13): EF 97-98%, grade 1 diastolic dysfunction  . HTN  (hypertension) 1988    a. Likely related to RAS; urinary metanephrines and plasma renin/aldosterone ratio normal. b. H/o HTN urgency 06/2011 after being off BP meds for a number of days  . HLD (hyperlipidemia) 2000  . Myocardial infarction 2009  . Anxiety 2001  . Depression 2001  . Hypertensive crisis 06/29/2011  . Leiomyoma   . Adenomyosis   . Fibromyalgia 1999  . Coronary vasospasm   . Chronic back pain     had 3 epidural injections on 02-14-13  . Hearing loss of left ear     s/p CVA 2006  . Personal history of colonic polyps-sessile serrated adenoma 02/23/2013  . CVA (cerebral vascular accident) 2006    lost hearing on left  . Irritable bowel syndrome   . Complication of anesthesia     has difficulty voiding after surgery  . H/O Graves' disease 2003    s/p radio-iodine treatment in 2003    Past Surgical History  Procedure Laterality Date  . Renal artery stent  2009  . Spine surgery    . Back surgery  2010 and 2011    In CT.  11/19/2008 (possibly a laminectomy) and in May of 2011 she underwent a L2-L3 fusion.  . Robotic assisted lap vaginal hysterectomy  05/13/2008    [place] hysterectomy  for myomatous uterus   . Colonoscopy    . Colonoscopy    . Cardiac catheterization  2009    states it was normal  . Vaginal  hysterectomy    . Knee arthroscopy Left   . Hip surgery Bilateral 1988 and 1989    "scraped head of femur"  . Lumbar laminectomy/decompression microdiscectomy Left 05/25/2013    Procedure: LUMBAR LAMINECTOMY/DECOMPRESSION MICRODISCECTOMY 1 LEVEL;  Surgeon: Winfield Cunas, MD;  Location: [facility];  Service: Neurosurgery;  Laterality: Left;  LEFT L5S1 microdiskectomy   Family History  Problem Relation Age of Onset  . Heart attack    . Stroke    . Hypertension Mother   . Asthma Mother   . Hyperlipidemia Mother   . Stroke Mother   . Hypertension Father   . Cancer Father     Prostate   . Heart attack Father   . Hyperlipidemia Father   . Stroke Father    . Hypertension Brother     Died from a massive stroke in setting of severe HTN at 29 (had had MI at 58)  . Heart attack Brother   . Early death Brother 61    stroke   . Hyperlipidemia Brother   . Metabolic syndrome Sister   . Depression Sister   . Hypertension Sister   . Diabetes Maternal Aunt   . Colon cancer Neg Hx   . Stomach cancer Neg Hx    History  Substance Use Topics  . Smoking status: Never Smoker   . Smokeless tobacco: Never Used  . Alcohol Use: Yes     Comment: very rarely   OB History   Grav Para Term Preterm Abortions TAB SAB Ect Mult Living                 Review of Systems  All other systems reviewed and are negative.  Allergies  Hydromorphone hcl; Prochlorperazine edisylate; Sumatriptan; Atorvastatin; and Tramadol  Home Medications   Prior to Admission medications   Medication Sig Start Date End Date Taking? Authorizing Provider  aspirin EC 81 MG tablet Take 81 mg by mouth daily.    Historical Provider, MD  cyclobenzaprine (FLEXERIL) 10 MG tablet Take 1 tablet (10 mg total) by mouth 3 (three) times daily as needed for muscle spasms. 05/25/13   Winfield Cunas, MD  gabapentin (NEURONTIN) 600 MG tablet Take 1,200 mg by mouth at bedtime. Take two tabs at bedtime 12/01/12   Meredith Staggers, MD  HYDROcodone-acetaminophen (NORCO/VICODIN) 5-325 MG per tablet Take 1-2 tablets by mouth every 6 (six) hours as needed for severe pain. 08/17/13   Josalyn C Funches, MD  labetalol (NORMODYNE) 200 MG tablet Take 4 tablets (800 mg total) by mouth 2 (two) times daily. 04/03/13   Larey Dresser, MD  levothyroxine (SYNTHROID, LEVOTHROID) 200 MCG tablet Take 1 tablet (200 mcg total) by mouth at bedtime. 08/08/13 08/08/14  Minerva Ends, MD  Linaclotide (LINZESS) 290 MCG CAPS capsule Take 1 capsule (290 mcg total) by mouth daily. 06/19/13   Josalyn C Funches, MD  NIFEdipine (PROCARDIA XL/ADALAT-CC) 90 MG 24 hr tablet Take 1 tablet (90 mg total) by mouth daily. 04/03/13   Larey Dresser, MD  nitroGLYCERIN (NITROSTAT) 0.4 MG SL tablet Place 1 tablet (0.4 mg total) under the tongue every 5 (five) minutes as needed. x3 doses as needed for chest pain 04/03/13   Larey Dresser, MD  pantoprazole (PROTONIX) 40 MG tablet Take 1 tablet (40 mg total) by mouth daily. 11/30/12   Liliane Shi, PA-C  potassium chloride SA (K-DUR,KLOR-CON) 20 MEQ tablet Take 20 mEq by mouth daily. 05/29/12   Larey Dresser,  MD  QUEtiapine (SEROQUEL) 100 MG tablet Take 100 mg by mouth at bedtime. Per psychiatrist.    Historical Provider, MD  rosuvastatin (CRESTOR) 40 MG tablet Take 1 tablet (40 mg total) by mouth daily. 04/09/13   Larey Dresser, MD  sertraline (ZOLOFT) 50 MG tablet Take 50 mg by mouth daily. Per psychiatrist    Historical Provider, MD  spironolactone (ALDACTONE) 50 MG tablet Take 1 tablet (50 mg total) by mouth daily. 04/03/13   Larey Dresser, MD   BP 193/102  Pulse 89  Temp(Src) 97.5 F (36.4 C) (Oral)  Resp 18  SpO2 100%  Physical Exam General: Well-developed, well-nourished female in no acute distress; appearance consistent with age of record HENT: normocephalic; atraumatic; TMs normal Eyes: pupils equal, round and reactive to light; extraocular muscles intact; slight nystagmus Neck: supple Heart: regular rate and rhythm Lungs: clear to auscultation bilaterally Abdomen: soft; nondistended; nontender; bowel sounds present Extremities: No deformity; full range of motion; pulses normal Neurologic: Awake, alert and oriented; motor function intact in all extremities and symmetric except weakness of the left lower leg; no facial droop; deaf in left ear; negative Romberg; normal finger to nose; normal coordination speech Skin: Warm and dry Psychiatric: Flat    ED Course  Procedures (including critical care time)  MDM  4:59 AM Dizziness resolved after Valium 2.5 mg IV and Benadryl 25 mg IV.    Wynetta Fines, MD 09/28/13 [id number]

## 2013-09-28 NOTE — Discharge Instructions (Signed)
Vertigo Vertigo means you feel like you or your surroundings are moving when they are not. Vertigo can be dangerous if it occurs when you are at work, driving, or performing difficult activities.  CAUSES  Vertigo occurs when there is a conflict of signals sent to your brain from the visual and sensory systems in your body. There are many different causes of vertigo, including:  Infections, especially in the inner ear.  A bad reaction to a drug or misuse of alcohol and medicines.  Withdrawal from drugs or alcohol.  Rapidly changing positions, such as lying down or rolling over in bed.  A migraine headache.  Decreased blood flow to the brain.  Increased pressure in the brain from a head injury, infection, tumor, or bleeding. SYMPTOMS  You may feel as though the world is spinning around or you are falling to the ground. Because your balance is upset, vertigo can cause nausea and vomiting. You may have involuntary eye movements (nystagmus). DIAGNOSIS  Vertigo is usually diagnosed by physical exam. If the cause of your vertigo is unknown, your caregiver may perform imaging tests, such as an MRI scan (magnetic resonance imaging). TREATMENT  Most cases of vertigo resolve on their own, without treatment. Depending on the cause, your caregiver may prescribe certain medicines. If your vertigo is related to body position issues, your caregiver may recommend movements or procedures to correct the problem. In rare cases, if your vertigo is caused by certain inner ear problems, you may need surgery. HOME CARE INSTRUCTIONS   Follow your caregiver's instructions.  Avoid driving.  Avoid operating heavy machinery.  Avoid performing any tasks that would be dangerous to you or others during a vertigo episode.  Tell your caregiver if you notice that certain medicines seem to be causing your vertigo. Some of the medicines used to treat vertigo episodes can actually make them worse in some people. SEEK  IMMEDIATE MEDICAL CARE IF:   Your medicines do not relieve your vertigo or are making it worse.  You develop problems with talking, walking, weakness, or using your arms, hands, or legs.  You develop severe headaches.  Your nausea or vomiting continues or gets worse.  You develop visual changes.  A family member notices behavioral changes.  Your condition gets worse. MAKE SURE YOU:  Understand these instructions.  Will watch your condition.  Will get help right away if you are not doing well or get worse. Document Released: 03/03/2005 Document Revised: 08/16/2011 Document Reviewed: 12/10/2010 [employer] Patient Information 2014 [employer].  

## 2013-09-28 NOTE — ED Notes (Signed)
Bed: LF81 Expected date: 09/28/13 Expected time: 2:25 AM Means of arrival: Ambulance Comments: 53 yo F  Dizziness, N/V

## 2013-10-09 ENCOUNTER — Other Ambulatory Visit: Payer: Self-pay | Admitting: Orthopedic Surgery

## 2013-10-09 DIAGNOSIS — M25552 Pain in left hip: Secondary | ICD-10-CM

## 2013-10-09 DIAGNOSIS — M25359 Other instability, unspecified hip: Secondary | ICD-10-CM

## 2013-10-16 ENCOUNTER — Ambulatory Visit
Admission: RE | Admit: 2013-10-16 | Discharge: 2013-10-16 | Disposition: A | Discharge status: Home / Self Care | Payer: Medicaid Other | Source: Ambulatory Visit | Attending: Orthopedic Surgery | Admitting: Orthopedic Surgery

## 2013-10-16 DIAGNOSIS — M25359 Other instability, unspecified hip: Secondary | ICD-10-CM

## 2013-10-16 DIAGNOSIS — M25552 Pain in left hip: Secondary | ICD-10-CM

## 2013-10-16 MED ORDER — IOHEXOL 180 MG/ML  SOLN
12.0000 mL | Freq: Once | INTRAMUSCULAR | Status: AC | PRN
  Administered 2013-10-16: 12 mL via INTRA_ARTICULAR

## 2013-11-07 ENCOUNTER — Other Ambulatory Visit: Payer: Self-pay | Admitting: Family Medicine

## 2013-11-14 ENCOUNTER — Encounter: Payer: Self-pay | Admitting: *Deleted

## 2013-11-14 ENCOUNTER — Other Ambulatory Visit: Payer: Self-pay | Admitting: *Deleted

## 2013-11-16 ENCOUNTER — Ambulatory Visit (INDEPENDENT_AMBULATORY_CARE_PROVIDER_SITE_OTHER): Payer: Medicaid Other | Admitting: Cardiology

## 2013-11-16 ENCOUNTER — Encounter: Payer: Self-pay | Admitting: Cardiology

## 2013-11-16 VITALS — BP 143/85 | HR 100 | Ht 64.0 in | Wt 136.0 lb

## 2013-11-16 DIAGNOSIS — I10 Essential (primary) hypertension: Secondary | ICD-10-CM

## 2013-11-16 DIAGNOSIS — I201 Angina pectoris with documented spasm: Secondary | ICD-10-CM

## 2013-11-16 DIAGNOSIS — E785 Hyperlipidemia, unspecified: Secondary | ICD-10-CM

## 2013-11-16 LAB — BASIC METABOLIC PANEL
BUN: 13 mg/dL (ref 6–23)
CO2: 26 mEq/L (ref 19–32)
CREATININE: 1.1 mg/dL (ref 0.4–1.2)
Calcium: 9 mg/dL (ref 8.4–10.5)
Chloride: 105 mEq/L (ref 96–112)
GFR: 57.19 mL/min — AB (ref >60.00)
GLUCOSE: 89 mg/dL (ref 70–99)
POTASSIUM: 4.8 meq/L (ref 3.5–5.1)
Sodium: 139 mEq/L (ref 135–145)

## 2013-11-16 NOTE — Patient Instructions (Addendum)
Your physician recommends that you have  lab work today--BMET.  Your physician recommends that you return for a FASTING lipid profile: in 2 months.  You have been referred to Pharmacy for medication management.   Your physician recommends that you schedule a follow-up appointment in: 3 months with [facility]

## 2013-11-19 DIAGNOSIS — I10 Essential (primary) hypertension: Secondary | ICD-10-CM | POA: Insufficient documentation

## 2013-11-19 NOTE — Progress Notes (Signed)
Patient ID: [name] Rowe, female   DOB: 08-18-1960, 53 y.o.   MRN: [id number] PCP: Dr. Adrian Blackwater  53 yo with history of fibromuscular dysplasia of the renal arteries, HTN, and possible coronary vasospasm versus microvascular angina presents for followup.  Patient was admitted to the hospital in [place] in 2009 with severe chest pain.  At that time, she had a left heart cath showing no angiographic coronary disease but the catheter did induce RCA spasm, leading to concern that her chest pain might be coronary vasospasm.  She also had HTN found to be probably related to fibromuscular dysplasia of the renal arteries.  She had a stent placed in the right renal artery in 2009.    She was admitted twice in 1/13, the first time with a hypertensive urgency (she had been off her BP meds for a number of days).  She was re-admitted several days later with orthostatic symptoms and probable rebound tachycardia (she had not picked up her beta blocker prescription after discharge).  She was restarted on beta blocker and HCTZ was stopped.  Carotids in 2/13 showed no significant stenosis and renal artery dopplers in 2/13 showed no significant stenosis.    She was admitted again in 7/13 with atypical chest pain.  She had an ETT-myoview.  This was a submaximal study but showed no ischemia or infarction, EF 77%.  She was in the ER with chest pain in 10/13 but was sent home after cardiac enzymes and ECG were unremarkable. She was admitted again in 5/14 with atypical chest pain, ruled out for MI, and sent home.   She had side effects with ranolazine (started for microvascular angina) and had to stop it.  She also had side effects with clonidine.  She continues to have atypical chest pain episodes (nonexertional) once or twice a week, not as bad as in the past.  They are usually responsive to NTG.  She has not tolerated Imdur due to headache.  No significant exertional dyspnea.  BP is ok today but she says that she has not  been taking her meds regularly.  She has not been taking her Crestor at all.  She has not had side effects from Crestor.  Main problem recently seems to be low back pain.   Labs (10/11): LDL 136, HDL 44, TGs 159, creatinine 1.06, K 3.8 Labs (11/11): LDL 101, HDL 49, LFTs normal, K 4.8, creatinine 0.83 Labs (1/13): K 4.2, creatinine 1.16, urine metanephrines normal, PRA/aldosterone ratio normal, LDL 156, HDL 56 Labs (7/13): K 3.8, creatinine 0.96, TSH normal, LDL 98, HDL 60 Labs (9/13): LDL 166, TSH normal, K 3.7, creatinine 1.0 Labs (5/14): K 3.6, creatinine 1.16, LDL 168, HDL 51 Labs (7/14): LDL 179 Labs (8/14): K 4.4, creatinine 1.02 Labs (9/14): TSH 34 Labs (1/15): LDL 147, HDL 60 Labs (2/15): K 3.9, creatinine 1.0 Labs (3/15): TSH normal  ECG: NSR, normal  Allergies (verified):  1)  ! Compazine 2)  ! Imitrex  Past Medical History: 1. Migraine headaches 2. Chest pain: No angiographic coronary disease on cath in 2009 in [place].  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  Suspect coronary vasospasm versus microvascular obstruction.  Lexiscan myoview (11/11): EF 79%, normal perfusion.  ETT-myoview (9/13) with EF 77%, no ischemic or infarction but submaximal study.  Headaches with Imdur.  Did not tolerate ranolazine.  3. HTN: Renal artery stenosis has played a role. Urinary metanephrines and plasma renin/aldosterone ratio normal.  Does not tolerate clonidine.  4. Renal artery stenosis: Due  to fibromuscular dysplasia.  She had a renal artery stent in 2009.  Renal artery dopplers (12/11) suggestive of fibromuscular dysplasia but no significant stenosis. CTA abdomen (1/13) with evidence for fibromuscular dysplasia, right renal artery stent appears patent.  Renal artery dopplers (2/13) with FMD but no evidence for signficant stenosis.  5. Hyperlipidemia 6. Fibromyalgia 7. Echo (1/13) EF 89-21%, grade I diastolic dysfunction. 8. Hypothyroidism 9. Carotid dopplers (2/13): consistent with  FMD but no significant stenosis.  10. Fibromuscular dysplasia.  11. L-spine degenerative disc disease 12. Holter (2/15): No significant arrhythmia.     Family History: Mother with MI at 11, Father with MI at 72, brother with MI at 61 and died with stroke at 11  Social History: Moved to [place] from [place] with her husband.  Denies smoking cigarettes or using illegal drugs Alcohol Use - no Drug Use - no  ROS: All systems reviewed and negative except as per HPI.    Current Outpatient Prescriptions  Medication Sig Dispense Refill  . aspirin EC 81 MG tablet Take 81 mg by mouth daily.      . cyclobenzaprine (FLEXERIL) 10 MG tablet Take 1 tablet (10 mg total) by mouth 3 (three) times daily as needed for muscle spasms.  45 tablet  0  . gabapentin (NEURONTIN) 600 MG tablet Take 1,200 mg by mouth at bedtime. Take two tabs at bedtime      . HYDROcodone-acetaminophen (NORCO/VICODIN) 5-325 MG per tablet Take 1-2 tablets by mouth every 6 (six) hours as needed for severe pain.  10 tablet  0  . labetalol (NORMODYNE) 200 MG tablet Take 4 tablets (800 mg total) by mouth 2 (two) times daily.  720 tablet  1  . levothyroxine (SYNTHROID, LEVOTHROID) 200 MCG tablet TAKE ONE TABLET BY MOUTH AT BEDTIME  30 tablet  0  . Linaclotide (LINZESS) 290 MCG CAPS capsule Take 1 capsule (290 mcg total) by mouth daily.  31 capsule  11  . meclizine (ANTIVERT) 25 MG tablet Take 1 tablet (25 mg total) by mouth 3 (three) times daily as needed for dizziness.  30 tablet  0  . NIFEdipine (PROCARDIA XL/ADALAT-CC) 90 MG 24 hr tablet Take 1 tablet (90 mg total) by mouth daily.  90 tablet  1  . nitroGLYCERIN (NITROSTAT) 0.4 MG SL tablet Place 1 tablet (0.4 mg total) under the tongue every 5 (five) minutes as needed. x3 doses as needed for chest pain  100 tablet  3  . pantoprazole (PROTONIX) 40 MG tablet Take 1 tablet (40 mg total) by mouth daily.  30 tablet  2  . potassium chloride SA (K-DUR,KLOR-CON) 20 MEQ tablet Take 20  mEq by mouth daily.      . QUEtiapine (SEROQUEL) 100 MG tablet Take 100 mg by mouth at bedtime. Per psychiatrist.      . rosuvastatin (CRESTOR) 40 MG tablet Take 1 tablet (40 mg total) by mouth daily.  30 tablet  3  . sertraline (ZOLOFT) 50 MG tablet Take 50 mg by mouth daily. Per psychiatrist      . spironolactone (ALDACTONE) 50 MG tablet Take 1 tablet (50 mg total) by mouth daily.  90 tablet  1  . traZODone (DESYREL) 50 MG tablet Take 50 mg by mouth at bedtime.       No current facility-administered medications for this visit.    BP 143/85  Pulse 100  Ht 5\' 4"  (1.626 m)  Wt 61.689 kg (136 lb)  BMI 23.33 kg/m2 General:  Well developed, well nourished,  in no acute distress. Neck:  Neck supple, no JVD. No masses, thyromegaly or abnormal cervical nodes. Lungs:  Clear bilaterally to auscultation and percussion. Heart:  Non-displaced PMI, chest non-tender; regular rate and rhythm, S1, S2 without murmurs, rubs. +S4. Carotid upstroke normal, no bruit.  Pedals normal pulses. No edema, no varicosities. Abdomen:  Bowel sounds positive; abdomen soft and non-tender without masses, organomegaly, or hernias noted. No hepatosplenomegaly. Extremities:  No clubbing or cyanosis. Neurologic:  Alert and oriented x 3. Psych:  Normal affect.  Assessment/Plan:  Chest pain I suspect that the patient has either coronary microvascular disease (Syndrome X) or coronary vasospasm.  She had a cath with no angiographic coronary disease in 2009 and Lexiscan myoview in 11/11 showed no evidence for ischemia or infarction. Repeat myoview in 7/13 showed no ischemia or infarction but was submaximal.  She continues to have periodic chest pain episodes responsive to NTG.  She cannot take Imdur due to headaches and she also had side effects with ranolazine.  Currently, symptoms are reasonably controlled and stable.  I suspect stress also plays a role.  HTN  BP is a little high but she admits to not being regular with her  medications.  I will refer her for pharmacy consult to help her organize her meds.   HYPERLIPIDEMIA  She needs to restart Crestor.  Will repeat lipids in 2 months with LFTs.   Loralie Champagne 11/19/2013

## 2013-11-22 DIAGNOSIS — M7060 Trochanteric bursitis, unspecified hip: Secondary | ICD-10-CM | POA: Insufficient documentation

## 2013-11-27 ENCOUNTER — Ambulatory Visit: Payer: Medicaid Other | Admitting: Pharmacist

## 2013-11-28 ENCOUNTER — Other Ambulatory Visit: Payer: Self-pay

## 2013-11-28 DIAGNOSIS — Z1231 Encounter for screening mammogram for malignant neoplasm of breast: Secondary | ICD-10-CM

## 2013-12-03 ENCOUNTER — Ambulatory Visit (INDEPENDENT_AMBULATORY_CARE_PROVIDER_SITE_OTHER): Payer: Medicaid Other | Admitting: Family Medicine

## 2013-12-03 ENCOUNTER — Encounter: Payer: Self-pay | Admitting: Family Medicine

## 2013-12-03 VITALS — BP 191/115 | HR 98 | Temp 98.0°F | Wt 135.0 lb

## 2013-12-03 DIAGNOSIS — I10 Essential (primary) hypertension: Secondary | ICD-10-CM

## 2013-12-03 DIAGNOSIS — R350 Frequency of micturition: Secondary | ICD-10-CM

## 2013-12-03 DIAGNOSIS — M25559 Pain in unspecified hip: Secondary | ICD-10-CM

## 2013-12-03 DIAGNOSIS — M545 Low back pain, unspecified: Secondary | ICD-10-CM

## 2013-12-03 DIAGNOSIS — M25551 Pain in right hip: Secondary | ICD-10-CM

## 2013-12-03 LAB — POCT UA - MICROSCOPIC ONLY

## 2013-12-03 LAB — POCT URINALYSIS DIPSTICK
BILIRUBIN UA: NEGATIVE
Glucose, UA: NEGATIVE
Ketones, UA: NEGATIVE
Leukocytes, UA: NEGATIVE
Nitrite, UA: NEGATIVE
PROTEIN UA: 30
Spec Grav, UA: >=1.03
Urobilinogen, UA: 0.2
pH, UA: 5.5

## 2013-12-03 MED ORDER — CYCLOBENZAPRINE HCL 10 MG PO TABS: 10.0000 mg | ORAL_TABLET | Freq: Three times a day (TID) | ORAL | Status: DC | PRN

## 2013-12-03 MED ORDER — GABAPENTIN 600 MG PO TABS: ORAL_TABLET | ORAL | Status: DC

## 2013-12-03 MED ORDER — HYDROCODONE-ACETAMINOPHEN 5-325 MG PO TABS: 1.0000 | ORAL_TABLET | Freq: Two times a day (BID) | ORAL | Status: DC | PRN

## 2013-12-03 NOTE — Assessment & Plan Note (Signed)
Not well controlled today and has history of labile hypertension.  Relates she took all her medications.  No signs of acute end organ damage.  Lowered after she rested.  Will have her monitor and follow up with cardiology

## 2013-12-03 NOTE — Progress Notes (Signed)
   Subjective:    Patient ID: [name] Rowe, female    DOB: Mar 15, 1961, 53 y.o.   MRN: [id number]  HPI  Hip Low Back Pain Started Thursday 4 day ago with pain in left hip when has now changed to R hip.  Has no pain with sitting or lying but significant pain with standing and walking.  No incontinence, no trauma, has noticed mild increase in urination but without pain or blood.  Has taken a few motrin.   Hypertension Took all blood pressure medications this am.  Relates her blood pressure is often labile and has had higher in past.  No chest pain or shortness of breath or visual changes or edema   PMH  Has history of back pain with previous injections FMD of renal arteries.  CT in 1/14 no aneurysm   Review of Systems     Objective:   Physical Exam  Alert no acute distress.  Talkative social when sitting  Walks with a limp when gets on exam table Heart - Regular rate and rhythm.  No murmurs, gallops or rubs. Lungs:  Normal respiratory effort, chest expands symmetrically. Lungs are clear to auscultation, no crackles or wheezes. Abdomen: soft and non-tender without masses, organomegaly or hernias noted.  No guarding or rebound No pulsatile masses Pulses - 3+ equal upper and lower extrem Back - no focal tender areas When stands has pain over R hip flank area but not changed by palpation.  No CVAT  Extrem - no SLR, no pain with ROM of hips Distal strength and sensation intact in lower extrem   Recheck blood pressure 169/98      Assessment & Plan:

## 2013-12-03 NOTE — Assessment & Plan Note (Signed)
Worsened.  Seems most consistent with nerve impingement given very positional nature and normal hip joint exam.  No signs of renal stone or  Aneurysm or UTI or fracture (no trauma and shifting sides)   Prescribed vicoden (cant take tramadol and should not take NSAID with high blood pressure) and flexaril and increased gabapentin Follow up for improvement

## 2013-12-03 NOTE — Patient Instructions (Addendum)
Good to see you today!  Thanks for coming in.  If the pain gets a lot worse or you lose strength in your legs or cant control your bowels then come right back  I would take the gabapentin 600 mg twice daily and 2 at night  Do not take ibuprofen or advil due to your high blood pressure  Take vicodin 1 to 2 a day as needed to move around  Try flexaril to see if will help especially at night  Come back and see your new PCP in 3 weeks  Hand wrote on her copy - BP - follow up with Dr Marigene Ehlers for medication visit as he instructed and for blood pressure check

## 2013-12-04 ENCOUNTER — Other Ambulatory Visit: Payer: Self-pay | Admitting: Family Medicine

## 2013-12-04 MED ORDER — GABAPENTIN 300 MG PO CAPS: ORAL_CAPSULE | ORAL | Status: DC

## 2013-12-26 ENCOUNTER — Ambulatory Visit: Payer: Medicaid Other

## 2014-01-03 ENCOUNTER — Ambulatory Visit: Payer: Medicaid Other | Admitting: Family Medicine

## 2014-01-25 ENCOUNTER — Other Ambulatory Visit (INDEPENDENT_AMBULATORY_CARE_PROVIDER_SITE_OTHER): Payer: Medicaid Other

## 2014-01-25 DIAGNOSIS — I10 Essential (primary) hypertension: Secondary | ICD-10-CM

## 2014-01-25 DIAGNOSIS — E785 Hyperlipidemia, unspecified: Secondary | ICD-10-CM

## 2014-01-26 LAB — LIPID PANEL
Cholesterol: 266 mg/dL — ABNORMAL HIGH (ref 0–200)
HDL: 71.8 mg/dL (ref >39.00)
LDL Cholesterol: 166 mg/dL — ABNORMAL HIGH (ref 0–99)
NonHDL: 194.2
TRIGLYCERIDES: 141 mg/dL (ref 0.0–149.0)
Total CHOL/HDL Ratio: 4
VLDL: 28.2 mg/dL (ref 0.0–40.0)

## 2014-01-28 ENCOUNTER — Other Ambulatory Visit: Payer: Self-pay | Admitting: *Deleted

## 2014-01-28 DIAGNOSIS — E785 Hyperlipidemia, unspecified: Secondary | ICD-10-CM

## 2014-01-28 MED ORDER — ROSUVASTATIN CALCIUM 40 MG PO TABS: 40.0000 mg | ORAL_TABLET | Freq: Every day | ORAL | Status: DC

## 2014-01-29 ENCOUNTER — Other Ambulatory Visit: Payer: Self-pay | Admitting: Family Medicine

## 2014-02-01 ENCOUNTER — Telehealth: Payer: Self-pay | Admitting: *Deleted

## 2014-02-01 NOTE — Telephone Encounter (Signed)
Patient stated that medicaid is no longer going to pay for the crestor and she needs a prior authorization. Thanks, MI

## 2014-02-01 NOTE — Telephone Encounter (Signed)
Spoke with Nermine at [phone]. Received approval for Crestor 40mg  1 tablet daily, approval number [id number].

## 2014-02-01 NOTE — Telephone Encounter (Signed)
Do you know where I call to get the prior authorization information? Thanks

## 2014-02-01 NOTE — Telephone Encounter (Signed)
The medicaid ([place]) office phone number is [phone] or the website is [email or web address]. Thanks, MI

## 2014-02-06 ENCOUNTER — Encounter: Payer: Self-pay | Admitting: Physician Assistant

## 2014-02-06 ENCOUNTER — Ambulatory Visit (INDEPENDENT_AMBULATORY_CARE_PROVIDER_SITE_OTHER): Payer: Medicaid Other | Admitting: Physician Assistant

## 2014-02-06 VITALS — BP 150/98 | HR 108 | Wt 135.0 lb

## 2014-02-06 DIAGNOSIS — F411 Generalized anxiety disorder: Secondary | ICD-10-CM

## 2014-02-06 DIAGNOSIS — I10 Essential (primary) hypertension: Secondary | ICD-10-CM

## 2014-02-06 DIAGNOSIS — F419 Anxiety disorder, unspecified: Secondary | ICD-10-CM

## 2014-02-06 DIAGNOSIS — I201 Angina pectoris with documented spasm: Secondary | ICD-10-CM

## 2014-02-06 NOTE — Assessment & Plan Note (Signed)
Patient is having severe anxiety over her son's cancer diagnosis. Recommend followup with her psychiatrist.

## 2014-02-06 NOTE — Progress Notes (Signed)
[name] Rowe is a 53 yo female patient of Dr. Aundra Dubin with history of fibromuscular dysplasia of the renal arteries, HTN, and possible coronary vasospasm versus microvascular angina presents for followup.  Patient was admitted to the hospital in [place] in 2009 with severe chest pain.  At that time, she had a left heart cath showing no angiographic coronary disease but the catheter did induce RCA spasm, leading to concern that her chest pain might be coronary vasospasm.  She also had HTN found to be probably related to fibromuscular dysplasia of the renal arteries.  She had a stent placed in the right renal artery in 2009.    She was admitted twice in 1/13, the first time with a hypertensive urgency (she had been off her BP meds for a number of days).  She was re-admitted several days later with orthostatic symptoms and probable rebound tachycardia (she had not picked up her beta blocker prescription after discharge).  She was restarted on beta blocker and HCTZ was stopped.  Carotids in 2/13 showed no significant stenosis and renal artery dopplers in 2/13 showed no significant stenosis.    She was admitted again in 7/13 with atypical chest pain.  She had an ETT-myoview.  This was a submaximal study but showed no ischemia or infarction, EF 77%.  She was in the ER with chest pain in 10/13 but was sent home after cardiac enzymes and ECG were unremarkable. She was admitted again in 5/14 with atypical chest pain, ruled out for MI, and sent home.   She had side effects with ranolazine (started for microvascular angina) and had to stop it.  She also had side effects with clonidine.    Patient comes in today for followup. She is having severe anxiety. Her son was diagnosed with some type of abdominal cancer and is having surgery next week. She recently spent a month with him and is going back to [place] for another month. She's having more chest pain and pressure at rest that is responsive to  nitroglycerin. Her blood pressures are up and down. She feels it is all related to her severe anxiety and is asking for something to keep her calm. She does see a psychiatrist and is already on Zoloft and Xanax. Her blood pressure is up today but she has not taken any of her medications. She has not been in the tolerated them door in the past because of headaches.       Allergies  Allergen Reactions  . Hydromorphone Hcl Other (See Comments)    Palpitations  . Prochlorperazine Edisylate Other (See Comments)    Causes Seizures   . Sumatriptan Palpitations  . Atorvastatin Other (See Comments)    Joint pain  . Tramadol Anxiety     Current Outpatient Prescriptions  Medication Sig Dispense Refill  . aspirin EC 81 MG tablet Take 81 mg by mouth daily.      . cyclobenzaprine (FLEXERIL) 10 MG tablet Take 1 tablet (10 mg total) by mouth 3 (three) times daily as needed for muscle spasms.  30 tablet  0  . gabapentin (NEURONTIN) 300 MG capsule Take 2 caps (600 mg) in AM and afternoon and 4 caps (1200mg ) at bedtime  240 capsule  3  . HYDROcodone-acetaminophen (NORCO/VICODIN) 5-325 MG per tablet Take 1 tablet by mouth 2 (two) times daily as needed for severe pain.  30 tablet  0  . labetalol (NORMODYNE) 200 MG tablet Take 4 tablets (800 mg total) by mouth  2 (two) times daily.  720 tablet  1  . levothyroxine (SYNTHROID, LEVOTHROID) 200 MCG tablet TAKE ONE TABLET BY MOUTH AT BEDTIME  30 tablet  0  . Linaclotide (LINZESS) 290 MCG CAPS capsule Take 1 capsule (290 mcg total) by mouth daily.  31 capsule  11  . meclizine (ANTIVERT) 25 MG tablet Take 1 tablet (25 mg total) by mouth 3 (three) times daily as needed for dizziness.  30 tablet  0  . NIFEdipine (PROCARDIA XL/ADALAT-CC) 90 MG 24 hr tablet Take 1 tablet (90 mg total) by mouth daily.  90 tablet  1  . nitroGLYCERIN (NITROSTAT) 0.4 MG SL tablet Place 1 tablet (0.4 mg total) under the tongue every 5 (five) minutes as needed. x3 doses as needed for chest  pain  100 tablet  3  . pantoprazole (PROTONIX) 40 MG tablet Take 1 tablet (40 mg total) by mouth daily.  30 tablet  2  . potassium chloride SA (K-DUR,KLOR-CON) 20 MEQ tablet Take 20 mEq by mouth daily.      . QUEtiapine (SEROQUEL) 100 MG tablet Take 100 mg by mouth at bedtime. Per psychiatrist.      . rosuvastatin (CRESTOR) 40 MG tablet Take 1 tablet (40 mg total) by mouth daily.  30 tablet  3  . sertraline (ZOLOFT) 50 MG tablet Take 50 mg by mouth daily. Per psychiatrist      . spironolactone (ALDACTONE) 50 MG tablet Take 1 tablet (50 mg total) by mouth daily.  90 tablet  1  . traZODone (DESYREL) 50 MG tablet Take 50 mg by mouth at bedtime.       No current facility-administered medications for this visit.    Past Medical History  Diagnosis Date  . Migraine headache   . Chest pain     a. No angiographic CAD by cath 2009 in [place]. There was catheter-induced RCA vasospasm versus microvascular obstruction. bCarlton Adam myoview 04/2010 - EF 79%, normal perfusion.;  c. ETT-Myoview 7/13: no ischemia, EF 77%, submax exercise  . Renal artery stenosis     a. Due to fibromuscular dysplasia. b. Renal stent 2009. c. Studies:  CTA abdomen (1/13) with evidence for fibromuscular dysplasia, right renal artery stent appears patent. Renal artery dopplers (2/13) with FMD but no evidence for signficant stenosis.   . Hypothyroidism 2003    Graves disease s/p radio-iodine treatment in 2003   . Fibromuscular dysplasia 2009    a. Renal artery stenosis due to this. b. Carotid dopplers 07/2011 c/w FMD but no significant stenosis  . Tachycardia 2000    10/27/10-Holter -NSR with occ sinus tachy-rare PACs, no sig arrhythmia  . Hx of echocardiogram     a. Echo (1/13): EF 36-46%, grade 1 diastolic dysfunction  . HTN (hypertension) 1988    a. Likely related to RAS; urinary metanephrines and plasma renin/aldosterone ratio normal. b. H/o HTN urgency 06/2011 after being off BP meds for a number of days  . HLD  (hyperlipidemia) 2000  . Myocardial infarction 2009  . Anxiety 2001  . Depression 2001  . Hypertensive crisis 06/29/2011  . Leiomyoma   . Adenomyosis   . Fibromyalgia 1999  . Coronary vasospasm   . Chronic back pain     had 3 epidural injections on 02-14-13  . Hearing loss of left ear     s/p CVA 2006  . Personal history of colonic polyps-sessile serrated adenoma 02/23/2013  . CVA (cerebral vascular accident) 2006    lost hearing on left  .  Irritable bowel syndrome   . Complication of anesthesia     has difficulty voiding after surgery  . H/O Graves' disease 2003    s/p radio-iodine treatment in 2003     Past Surgical History  Procedure Laterality Date  . Renal artery stent  2009  . Spine surgery    . Back surgery  2010 and 2011    In CT.  11/19/2008 (possibly a laminectomy) and in May of 2011 she underwent a L2-L3 fusion.  . Robotic assisted lap vaginal hysterectomy  05/13/2008    [place] hysterectomy  for myomatous uterus   . Colonoscopy    . Colonoscopy    . Cardiac catheterization  2009    states it was normal  . Vaginal hysterectomy    . Knee arthroscopy Left   . Hip surgery Bilateral 1988 and 1989    "scraped head of femur"  . Lumbar laminectomy/decompression microdiscectomy Left 05/25/2013    Procedure: LUMBAR LAMINECTOMY/DECOMPRESSION MICRODISCECTOMY 1 LEVEL;  Surgeon: Winfield Cunas, MD;  Location: [facility];  Service: Neurosurgery;  Laterality: Left;  LEFT L5S1 microdiskectomy    Family History  Problem Relation Age of Onset  . Heart attack    . Stroke    . Hypertension Mother   . Asthma Mother   . Hyperlipidemia Mother   . Stroke Mother   . Hypertension Father   . Prostate cancer Father   . Heart attack Father   . Hyperlipidemia Father   . Stroke Father   . Hypertension Brother     Died from a massive stroke in setting of severe HTN at 4 (had had MI at 61)  . Heart attack Brother   . Early death Brother 73    stroke   . Hyperlipidemia  Brother   . Metabolic syndrome Sister   . Depression Sister   . Hypertension Sister   . Diabetes Maternal Aunt   . Colon cancer Neg Hx   . Stomach cancer Neg Hx     History   Social History  . Marital Status: Legally Separated    Spouse Name: N/A    Number of Children: 2  . Years of Education: N/A   Occupational History  . worked in a Development worker, international [employer].     Quit about a yr ago because fo work related injury to left knee   Social History Main Topics  . Smoking status: Never Smoker   . Smokeless tobacco: Never Used  . Alcohol Use: Yes     Comment: very rarely  . Drug Use: No  . Sexual Activity: Not Currently   Other Topics Concern  . Not on file   Social History Narrative   Second Husband is currently in jail for an offense committed in [place] and he is awaiting extradition.   Lives alone.    Jehovah's witness.    Walks 20-30 minutes.                 ROS: See history of present illness otherwise negative  BP 150/98  Pulse 108  Wt 135 lb (61.236 kg)  PHYSICAL EXAM: Well-nournished, extremely anxious. Neck: No JVD, HJR, Bruit, or thyroid enlargement  Lungs: No tachypnea, clear without wheezing, rales, or rhonchi  Cardiovascular: RRR, PMI not displaced, positive S4, no murmur, bruit, thrill, or heave.  Abdomen: BS normal. Soft without organomegaly, masses, lesions or tenderness.  Extremities: without cyanosis, clubbing or edema. Good distal pulses bilateral  SKin: Warm, no lesions or  rashes   Musculoskeletal: No deformities  Neuro: no focal signs   Wt Readings from Last 3 Encounters:  12/03/13 135 lb (61.236 kg)  11/16/13 136 lb (61.689 kg)  08/17/13 134 lb (60.782 kg)

## 2014-02-06 NOTE — Assessment & Plan Note (Signed)
Patient continues to have chest pain that seems to have worsened since her son's diagnosis of cancer. She has severe anxiety which is causing a lot of her chest pain. It is nitroglycerin responsive. She has not tolerated Ranexa or Imdur. Continue current therapy. Followup with Dr. Marigene Ehlers in 2 months.

## 2014-02-06 NOTE — Patient Instructions (Signed)
Your physician recommends that you schedule a follow-up appointment in: 2 months with DR. [place]  Your physician recommends that you continue on your current medications as directed. Please refer to the Current Medication list given to you today.

## 2014-02-06 NOTE — Assessment & Plan Note (Signed)
Blood pressure is elevated today but she has not taken her medications. When her blood pressure is up she takes an extra Normodyne. Continue to monitor.

## 2014-02-18 ENCOUNTER — Ambulatory Visit: Payer: Medicaid Other | Admitting: Physician Assistant

## 2014-03-21 ENCOUNTER — Other Ambulatory Visit: Payer: Self-pay | Admitting: Family Medicine

## 2014-03-22 NOTE — Telephone Encounter (Signed)
spoke with patient and informed her that rx was refilled

## 2014-03-25 ENCOUNTER — Telehealth: Payer: Self-pay | Admitting: Family Medicine

## 2014-03-25 NOTE — Telephone Encounter (Signed)
Pt picked up her synthroid medication, accidentally left it in her shopping cart, went back to see if someone turned it in but no one has so far. Pt wants to know if she is able to get another rx sent in?

## 2014-03-26 MED ORDER — LEVOTHYROXINE SODIUM 200 MCG PO TABS: ORAL_TABLET | ORAL | Status: DC

## 2014-04-11 ENCOUNTER — Encounter (HOSPITAL_COMMUNITY): Payer: Self-pay | Admitting: *Deleted

## 2014-04-11 ENCOUNTER — Observation Stay (HOSPITAL_COMMUNITY)
Admission: EM | Admit: 2014-04-11 | Discharge: 2014-04-16 | Disposition: A | Discharge status: Home / Self Care | Payer: Medicaid Other | Attending: Family Medicine | Admitting: Family Medicine

## 2014-04-11 ENCOUNTER — Emergency Department (HOSPITAL_COMMUNITY): Payer: Medicaid Other

## 2014-04-11 DIAGNOSIS — I252 Old myocardial infarction: Secondary | ICD-10-CM | POA: Insufficient documentation

## 2014-04-11 DIAGNOSIS — G43909 Migraine, unspecified, not intractable, without status migrainosus: Secondary | ICD-10-CM | POA: Diagnosis not present

## 2014-04-11 DIAGNOSIS — Z7982 Long term (current) use of aspirin: Secondary | ICD-10-CM | POA: Diagnosis not present

## 2014-04-11 DIAGNOSIS — I701 Atherosclerosis of renal artery: Secondary | ICD-10-CM | POA: Insufficient documentation

## 2014-04-11 DIAGNOSIS — I773 Arterial fibromuscular dysplasia: Secondary | ICD-10-CM | POA: Diagnosis not present

## 2014-04-11 DIAGNOSIS — F419 Anxiety disorder, unspecified: Secondary | ICD-10-CM | POA: Insufficient documentation

## 2014-04-11 DIAGNOSIS — I10 Essential (primary) hypertension: Secondary | ICD-10-CM | POA: Insufficient documentation

## 2014-04-11 DIAGNOSIS — Z86010 Personal history of colonic polyps: Secondary | ICD-10-CM | POA: Diagnosis not present

## 2014-04-11 DIAGNOSIS — Z8673 Personal history of transient ischemic attack (TIA), and cerebral infarction without residual deficits: Secondary | ICD-10-CM

## 2014-04-11 DIAGNOSIS — K5909 Other constipation: Secondary | ICD-10-CM | POA: Diagnosis present

## 2014-04-11 DIAGNOSIS — R079 Chest pain, unspecified: Secondary | ICD-10-CM

## 2014-04-11 DIAGNOSIS — R110 Nausea: Secondary | ICD-10-CM | POA: Insufficient documentation

## 2014-04-11 DIAGNOSIS — M542 Cervicalgia: Secondary | ICD-10-CM | POA: Diagnosis not present

## 2014-04-11 DIAGNOSIS — N800 Endometriosis of uterus: Secondary | ICD-10-CM | POA: Insufficient documentation

## 2014-04-11 DIAGNOSIS — Z79899 Other long term (current) drug therapy: Secondary | ICD-10-CM | POA: Diagnosis not present

## 2014-04-11 DIAGNOSIS — M797 Fibromyalgia: Secondary | ICD-10-CM | POA: Insufficient documentation

## 2014-04-11 DIAGNOSIS — F329 Major depressive disorder, single episode, unspecified: Secondary | ICD-10-CM | POA: Insufficient documentation

## 2014-04-11 DIAGNOSIS — R109 Unspecified abdominal pain: Secondary | ICD-10-CM | POA: Insufficient documentation

## 2014-04-11 DIAGNOSIS — I639 Cerebral infarction, unspecified: Secondary | ICD-10-CM | POA: Diagnosis not present

## 2014-04-11 DIAGNOSIS — E039 Hypothyroidism, unspecified: Secondary | ICD-10-CM | POA: Diagnosis present

## 2014-04-11 DIAGNOSIS — R61 Generalized hyperhidrosis: Secondary | ICD-10-CM | POA: Insufficient documentation

## 2014-04-11 DIAGNOSIS — G8929 Other chronic pain: Secondary | ICD-10-CM | POA: Insufficient documentation

## 2014-04-11 DIAGNOSIS — I201 Angina pectoris with documented spasm: Secondary | ICD-10-CM | POA: Diagnosis present

## 2014-04-11 DIAGNOSIS — Z23 Encounter for immunization: Secondary | ICD-10-CM | POA: Diagnosis not present

## 2014-04-11 DIAGNOSIS — H9192 Unspecified hearing loss, left ear: Secondary | ICD-10-CM | POA: Diagnosis not present

## 2014-04-11 DIAGNOSIS — T79A3XA Traumatic compartment syndrome of abdomen, initial encounter: Secondary | ICD-10-CM

## 2014-04-11 DIAGNOSIS — E785 Hyperlipidemia, unspecified: Secondary | ICD-10-CM | POA: Diagnosis not present

## 2014-04-11 DIAGNOSIS — M549 Dorsalgia, unspecified: Secondary | ICD-10-CM | POA: Diagnosis not present

## 2014-04-11 DIAGNOSIS — K589 Irritable bowel syndrome without diarrhea: Secondary | ICD-10-CM | POA: Insufficient documentation

## 2014-04-11 DIAGNOSIS — H532 Diplopia: Secondary | ICD-10-CM

## 2014-04-11 DIAGNOSIS — Z8679 Personal history of other diseases of the circulatory system: Secondary | ICD-10-CM | POA: Diagnosis present

## 2014-04-11 LAB — CBC
HEMATOCRIT: 40.3 % (ref 36.0–46.0)
HEMOGLOBIN: 14.4 g/dL (ref 12.0–15.0)
MCH: 30.8 pg (ref 26.0–34.0)
MCHC: 35.7 g/dL (ref 30.0–36.0)
MCV: 86.3 fL (ref 78.0–100.0)
Platelets: 318 10*3/uL (ref 150–400)
RBC: 4.67 MIL/uL (ref 3.87–5.11)
RDW: 12 % (ref 11.5–15.5)
WBC: 8.9 10*3/uL (ref 4.0–10.5)

## 2014-04-11 LAB — I-STAT TROPONIN, ED: Troponin i, poc: 0 ng/mL (ref 0.00–0.08)

## 2014-04-11 MED ORDER — NITROGLYCERIN 0.4 MG SL SUBL
0.4000 mg | SUBLINGUAL_TABLET | SUBLINGUAL | Status: DC | PRN
  Administered 2014-04-11 – 2014-04-12 (×3): 0.4 mg via SUBLINGUAL
  Filled 2014-04-11 (×3): qty 1

## 2014-04-11 MED ORDER — ONDANSETRON HCL 4 MG/2ML IJ SOLN
4.0000 mg | Freq: Once | INTRAMUSCULAR | Status: AC
  Administered 2014-04-11: 4 mg via INTRAVENOUS
  Filled 2014-04-11: qty 2

## 2014-04-11 NOTE — ED Notes (Signed)
Patient with cp starting approx 1 hour ago, sharp pain/central chest, patient with nausea/vomiting/diaphoresis, patient states pain 9/10, patient received nitro x 2 and asa 324 mg pta per ems, patient with MI in 2009

## 2014-04-12 ENCOUNTER — Emergency Department (HOSPITAL_COMMUNITY): Payer: Medicaid Other

## 2014-04-12 ENCOUNTER — Encounter: Payer: Self-pay | Admitting: *Deleted

## 2014-04-12 DIAGNOSIS — E038 Other specified hypothyroidism: Secondary | ICD-10-CM

## 2014-04-12 DIAGNOSIS — I201 Angina pectoris with documented spasm: Secondary | ICD-10-CM

## 2014-04-12 DIAGNOSIS — R079 Chest pain, unspecified: Principal | ICD-10-CM

## 2014-04-12 DIAGNOSIS — I773 Arterial fibromuscular dysplasia: Secondary | ICD-10-CM

## 2014-04-12 DIAGNOSIS — Z8673 Personal history of transient ischemic attack (TIA), and cerebral infarction without residual deficits: Secondary | ICD-10-CM

## 2014-04-12 DIAGNOSIS — M549 Dorsalgia, unspecified: Secondary | ICD-10-CM | POA: Diagnosis not present

## 2014-04-12 DIAGNOSIS — M797 Fibromyalgia: Secondary | ICD-10-CM

## 2014-04-12 DIAGNOSIS — E785 Hyperlipidemia, unspecified: Secondary | ICD-10-CM

## 2014-04-12 DIAGNOSIS — K5900 Constipation, unspecified: Secondary | ICD-10-CM

## 2014-04-12 DIAGNOSIS — R110 Nausea: Secondary | ICD-10-CM | POA: Diagnosis not present

## 2014-04-12 DIAGNOSIS — R61 Generalized hyperhidrosis: Secondary | ICD-10-CM | POA: Diagnosis not present

## 2014-04-12 DIAGNOSIS — F419 Anxiety disorder, unspecified: Secondary | ICD-10-CM

## 2014-04-12 DIAGNOSIS — Z8679 Personal history of other diseases of the circulatory system: Secondary | ICD-10-CM | POA: Diagnosis present

## 2014-04-12 DIAGNOSIS — I10 Essential (primary) hypertension: Secondary | ICD-10-CM

## 2014-04-12 LAB — CBC
HCT: 40.4 % (ref 36.0–46.0)
Hemoglobin: 13.8 g/dL (ref 12.0–15.0)
MCH: 30.5 pg (ref 26.0–34.0)
MCHC: 34.2 g/dL (ref 30.0–36.0)
MCV: 89.4 fL (ref 78.0–100.0)
Platelets: 294 10*3/uL (ref 150–400)
RBC: 4.52 MIL/uL (ref 3.87–5.11)
RDW: 12.1 % (ref 11.5–15.5)
WBC: 7.6 10*3/uL (ref 4.0–10.5)

## 2014-04-12 LAB — CREATININE, SERUM
CREATININE: 1.13 mg/dL — AB (ref 0.50–1.10)
GFR calc Af Amer: 64 mL/min — ABNORMAL LOW (ref >90)
GFR calc non Af Amer: 55 mL/min — ABNORMAL LOW (ref >90)

## 2014-04-12 LAB — HEMOGLOBIN A1C
Hgb A1c MFr Bld: 5.2 % (ref <5.7)
Mean Plasma Glucose: 103 mg/dL (ref <117)

## 2014-04-12 LAB — BASIC METABOLIC PANEL
Anion gap: 13 (ref 5–15)
BUN: 17 mg/dL (ref 6–23)
CALCIUM: 9.4 mg/dL (ref 8.4–10.5)
CO2: 25 mEq/L (ref 19–32)
Chloride: 102 mEq/L (ref 96–112)
Creatinine, Ser: 1.34 mg/dL — ABNORMAL HIGH (ref 0.50–1.10)
GFR calc Af Amer: 52 mL/min — ABNORMAL LOW (ref >90)
GFR, EST NON AFRICAN AMERICAN: 45 mL/min — AB (ref >90)
GLUCOSE: 103 mg/dL — AB (ref 70–99)
Potassium: 3.9 mEq/L (ref 3.7–5.3)
Sodium: 140 mEq/L (ref 137–147)

## 2014-04-12 LAB — TROPONIN I
Troponin I: <0.3 ng/mL (ref <0.30)
Troponin I: <0.3 ng/mL (ref <0.30)
Troponin I: <0.3 ng/mL (ref <0.30)

## 2014-04-12 LAB — D-DIMER, QUANTITATIVE: D-Dimer, Quant: 0.36 ug/mL-FEU (ref 0.00–0.48)

## 2014-04-12 MED ORDER — HEPARIN (PORCINE) IN NACL 100-0.45 UNIT/ML-% IJ SOLN
750.0000 [IU]/h | INTRAMUSCULAR | Status: DC
  Administered 2014-04-12: 750 [IU]/h via INTRAVENOUS
  Filled 2014-04-12: qty 250

## 2014-04-12 MED ORDER — LORAZEPAM 2 MG/ML IJ SOLN
1.0000 mg | Freq: Once | INTRAMUSCULAR | Status: AC
  Administered 2014-04-12: 1 mg via INTRAVENOUS

## 2014-04-12 MED ORDER — PANTOPRAZOLE SODIUM 40 MG PO TBEC
40.0000 mg | DELAYED_RELEASE_TABLET | Freq: Every day | ORAL | Status: DC
  Administered 2014-04-12 – 2014-04-16 (×5): 40 mg via ORAL
  Filled 2014-04-12 (×5): qty 1

## 2014-04-12 MED ORDER — ROSUVASTATIN CALCIUM 10 MG PO TABS
40.0000 mg | ORAL_TABLET | Freq: Every day | ORAL | Status: DC
  Administered 2014-04-12 – 2014-04-15 (×4): 40 mg via ORAL
  Filled 2014-04-12: qty 4
  Filled 2014-04-12: qty 1
  Filled 2014-04-12 (×2): qty 4

## 2014-04-12 MED ORDER — LORAZEPAM 2 MG/ML IJ SOLN
1.0000 mg | Freq: Four times a day (QID) | INTRAMUSCULAR | Status: DC | PRN
  Administered 2014-04-12 – 2014-04-15 (×7): 1 mg via INTRAVENOUS
  Filled 2014-04-12 (×8): qty 1

## 2014-04-12 MED ORDER — HEPARIN SODIUM (PORCINE) 5000 UNIT/ML IJ SOLN: 5000.0000 [IU] | Freq: Three times a day (TID) | INTRAMUSCULAR | Status: DC

## 2014-04-12 MED ORDER — LORAZEPAM 0.5 MG PO TABS
0.5000 mg | ORAL_TABLET | Freq: Four times a day (QID) | ORAL | Status: DC | PRN
  Administered 2014-04-12: 0.5 mg via ORAL
  Filled 2014-04-12: qty 1

## 2014-04-12 MED ORDER — SODIUM CHLORIDE 0.9 % IV SOLN
INTRAVENOUS | Status: DC
  Administered 2014-04-12 (×2): via INTRAVENOUS

## 2014-04-12 MED ORDER — ASPIRIN EC 81 MG PO TBEC
81.0000 mg | DELAYED_RELEASE_TABLET | Freq: Every day | ORAL | Status: DC
  Administered 2014-04-12 – 2014-04-16 (×5): 81 mg via ORAL
  Filled 2014-04-12 (×5): qty 1

## 2014-04-12 MED ORDER — ACETAMINOPHEN 325 MG PO TABS
650.0000 mg | ORAL_TABLET | ORAL | Status: DC | PRN
  Administered 2014-04-12 – 2014-04-14 (×3): 650 mg via ORAL
  Filled 2014-04-12 (×3): qty 2

## 2014-04-12 MED ORDER — HEPARIN BOLUS VIA INFUSION
3000.0000 [IU] | Freq: Once | INTRAVENOUS | Status: AC
  Administered 2014-04-12: 3000 [IU] via INTRAVENOUS
  Filled 2014-04-12: qty 3000

## 2014-04-12 MED ORDER — SERTRALINE HCL 50 MG PO TABS
50.0000 mg | ORAL_TABLET | Freq: Every day | ORAL | Status: DC
  Administered 2014-04-12 – 2014-04-16 (×5): 50 mg via ORAL
  Filled 2014-04-12 (×5): qty 1

## 2014-04-12 MED ORDER — GABAPENTIN 300 MG PO CAPS
600.0000 mg | ORAL_CAPSULE | Freq: Every day | ORAL | Status: DC
  Administered 2014-04-12 – 2014-04-16 (×5): 600 mg via ORAL
  Filled 2014-04-12 (×7): qty 2

## 2014-04-12 MED ORDER — GABAPENTIN 600 MG PO TABS
1200.0000 mg | ORAL_TABLET | Freq: Every day | ORAL | Status: DC
Start: 2014-04-12 — End: 2014-04-16
  Administered 2014-04-12 – 2014-04-15 (×4): 1200 mg via ORAL
  Filled 2014-04-12 (×3): qty 2

## 2014-04-12 MED ORDER — INFLUENZA VAC SPLIT QUAD 0.5 ML IM SUSY
0.5000 mL | PREFILLED_SYRINGE | INTRAMUSCULAR | Status: AC
  Administered 2014-04-13: 0.5 mL via INTRAMUSCULAR
  Filled 2014-04-12: qty 0.5

## 2014-04-12 MED ORDER — MORPHINE SULFATE 4 MG/ML IJ SOLN
4.0000 mg | Freq: Once | INTRAMUSCULAR | Status: AC
  Administered 2014-04-12: 4 mg via INTRAVENOUS
  Filled 2014-04-12: qty 1

## 2014-04-12 MED ORDER — NIFEDIPINE ER OSMOTIC RELEASE 90 MG PO TB24
90.0000 mg | ORAL_TABLET | Freq: Every day | ORAL | Status: DC
  Administered 2014-04-12 – 2014-04-16 (×5): 90 mg via ORAL
  Filled 2014-04-12 (×8): qty 1

## 2014-04-12 MED ORDER — SPIRONOLACTONE 25 MG PO TABS
50.0000 mg | ORAL_TABLET | Freq: Every day | ORAL | Status: DC
  Administered 2014-04-12 – 2014-04-16 (×5): 50 mg via ORAL
  Filled 2014-04-12 (×4): qty 2
  Filled 2014-04-12: qty 1

## 2014-04-12 MED ORDER — HEPARIN SODIUM (PORCINE) 5000 UNIT/ML IJ SOLN
5000.0000 [IU] | Freq: Three times a day (TID) | INTRAMUSCULAR | Status: DC
  Administered 2014-04-12 – 2014-04-15 (×11): 5000 [IU] via SUBCUTANEOUS
  Filled 2014-04-12 (×8): qty 1

## 2014-04-12 MED ORDER — LEVOTHYROXINE SODIUM 100 MCG PO TABS
200.0000 ug | ORAL_TABLET | Freq: Every day | ORAL | Status: DC
  Administered 2014-04-12 – 2014-04-16 (×5): 200 ug via ORAL
  Filled 2014-04-12: qty 2
  Filled 2014-04-12: qty 4
  Filled 2014-04-12: qty 2
  Filled 2014-04-12 (×2): qty 1
  Filled 2014-04-12: qty 4

## 2014-04-12 MED ORDER — QUETIAPINE FUMARATE 50 MG PO TABS
100.0000 mg | ORAL_TABLET | Freq: Every day | ORAL | Status: DC
  Administered 2014-04-12 – 2014-04-15 (×4): 100 mg via ORAL
  Filled 2014-04-12 (×5): qty 2

## 2014-04-12 MED ORDER — NITROGLYCERIN 2 % TD OINT
1.0000 [in_us] | TOPICAL_OINTMENT | Freq: Once | TRANSDERMAL | Status: AC
  Administered 2014-04-12: 1 [in_us] via TOPICAL
  Filled 2014-04-12: qty 1

## 2014-04-12 MED ORDER — ONDANSETRON 8 MG/NS 50 ML IVPB
8.0000 mg | Freq: Four times a day (QID) | INTRAVENOUS | Status: DC | PRN
  Administered 2014-04-12: 8 mg via INTRAVENOUS
  Filled 2014-04-12 (×3): qty 8

## 2014-04-12 MED ORDER — REGADENOSON 0.4 MG/5ML IV SOLN
0.4000 mg | Freq: Once | INTRAVENOUS | Status: DC
Start: 2014-04-12 — End: 2014-04-16
  Filled 2014-04-12: qty 5

## 2014-04-12 MED ORDER — LINACLOTIDE 145 MCG PO CAPS
290.0000 ug | ORAL_CAPSULE | Freq: Every day | ORAL | Status: DC
  Administered 2014-04-13 – 2014-04-16 (×4): 290 ug via ORAL
  Filled 2014-04-12 (×3): qty 2
  Filled 2014-04-12 (×2): qty 1
  Filled 2014-04-12: qty 2

## 2014-04-12 MED ORDER — NITROGLYCERIN 2 % TD OINT
1.0000 [in_us] | TOPICAL_OINTMENT | Freq: Four times a day (QID) | TRANSDERMAL | Status: DC
  Administered 2014-04-12 – 2014-04-15 (×5): 1 [in_us] via TOPICAL
  Filled 2014-04-12 (×2): qty 30
  Filled 2014-04-12: qty 1

## 2014-04-12 MED ORDER — BUSPIRONE HCL 5 MG PO TABS
5.0000 mg | ORAL_TABLET | Freq: Two times a day (BID) | ORAL | Status: DC
  Administered 2014-04-12 – 2014-04-13 (×2): 5 mg via ORAL
  Filled 2014-04-12 (×3): qty 1

## 2014-04-12 MED ORDER — MORPHINE SULFATE 2 MG/ML IJ SOLN
1.0000 mg | INTRAMUSCULAR | Status: DC | PRN
  Administered 2014-04-12: 1 mg via INTRAVENOUS
  Filled 2014-04-12 (×2): qty 1

## 2014-04-12 MED ORDER — ONDANSETRON HCL 4 MG/2ML IJ SOLN
4.0000 mg | Freq: Four times a day (QID) | INTRAMUSCULAR | Status: DC | PRN
  Administered 2014-04-12 (×2): 4 mg via INTRAVENOUS
  Filled 2014-04-12 (×2): qty 2

## 2014-04-12 MED ORDER — LABETALOL HCL 200 MG PO TABS
800.0000 mg | ORAL_TABLET | Freq: Two times a day (BID) | ORAL | Status: DC
  Administered 2014-04-12: 800 mg via ORAL
  Filled 2014-04-12 (×2): qty 1

## 2014-04-12 MED ORDER — IOHEXOL 350 MG/ML SOLN: 100.0000 mL | Freq: Once | INTRAVENOUS | Status: AC | PRN

## 2014-04-12 MED ORDER — MORPHINE SULFATE 4 MG/ML IJ SOLN
4.0000 mg | Freq: Once | INTRAMUSCULAR | Status: AC
Start: 2014-04-12 — End: 2014-04-12
  Administered 2014-04-12: 4 mg via INTRAVENOUS
  Filled 2014-04-12: qty 1

## 2014-04-12 MED ORDER — ONDANSETRON HCL 4 MG/2ML IJ SOLN
4.0000 mg | Freq: Once | INTRAMUSCULAR | Status: AC
  Administered 2014-04-12: 4 mg via INTRAVENOUS
  Filled 2014-04-12: qty 2

## 2014-04-12 MED ORDER — SODIUM CHLORIDE 0.9 % IV BOLUS (SEPSIS)
1000.0000 mL | Freq: Once | INTRAVENOUS | Status: AC
  Administered 2014-04-12: 1000 mL via INTRAVENOUS

## 2014-04-12 NOTE — H&P (Signed)
[facility] Admission History and Physical Service Pager: [phone]  Patient name: [name] Rowe Medical record number: [id number] Date of birth: 12-12-1960 Age: 53 y.o. Gender: female  Primary Care Provider: Vance Gather, MD Consultants: None Code Status: Full   Chief Complaint: Chest pain   Assessment and Plan: [name] Rowe is a 53 y.o. female presenting with left sided chest pain, diaphoresis, dizziness, SOB, and tingling of the hands bilaterally . PMH is significant for fibromuscular dysplasia, HTN, and possible coronary vasospasm vs mircorvascular angina, depression, and anxiety.   #Chest pain:  Most likely secondary to coronary vasospasm vs anxiety (given h/o stress).  Per EMR, she was admitted to the hospital in [place] in 2009 with severe chest pain. At that time, she had a left heart cath showing no angiographic coronary disease but the catheter did induce RCA spasm, leading to concern that her chest pain might be coronary vasospasm. She has not tolerated Ranexa and Imdur in the past.ACS is less likely. Initial EKG not concerning for ischemia. Troponin negative x 1. HEART score of 3 (age and risk factors). Dimer and CTA without evidence of PE.   - Place in observation, telemetry, attending Dr. Ree Kida - Repeat EKG at [place] troponins - Continue troponins  - Consider increasing Zoloft as anxiety is playing a role.  - SL nitro PRN pain, Zofran PRN nausea (will need to f/u on repeat QTc  - Heparin drip started in ED, discontinue this as her presentation is non-concerning for ACS at this time.  - Continue ASA 81mg  - Morphine 2mg  q2hr PRN pain  - If no improvement, consider cards consult, pt sees Dr. Aundra Dubin as an outpatient  - TSH wnl in 2015. LDL elevated to 166 in 01/2014. Repeat Hemoglobin A1c, previous 5.2 in 10/2012  #HTN: BPs elevated in ED this AM. Most likely related to fibromuscular dysplasia of the renal arteries. Urinary  metanephrines and plasma renin/aldosterone ratio normal in the past. - continue nifedipine 90mg  - continue labetalol 800mg  daily  - continue spironolactone 50mg    #Fibromuscular dysplasia: S/p right renal artery stent placement in 2009 - Stable, HTN management as above - Creatinine 1.30  #Psych - Continue home Seroquel 100mg  qHS,  - Continue Zoloft 50mg    #Hyperlipidemia  - Continue Crestor 40mg   #Hypothyroidism  - Continue Synthroid 230mcg  #Arthralgia of hip: most likely 2/2 nerve impingement - Continue gabapentin 600mg  in AM, 1200mg  qHS and flexeril 10mg  TID PRN  # IBS: stable - Continue Linzess  FEN/GI: NS 75cc/hr, heart healthy diet  Prophylaxis: Heparin  Protonix 40mg  QD  Disposition: Place in observation,  History of Present Illness: [name] Rowe is a 53 y.o. female with a PMHx of fibromuscular dysplasia, HTN, and possible coronary vasospasm vs mircorvascular angina presenting with sudden onset left sided chest pain, diaphoresis, dizziness, SOB, and tingling of the hands bilaterally that occurred at 9:30pm on 11/5.  [facility] when she began to have severe chest pain that radiated to her left axilla and to her back. She also endorsed pain in her teeth and neck with one episode of emesis. While her friend called EMS, she took nitroglycerin and ASA 81mg  x 3. She felt like there may have been some improvement from the nitroglycerin, however the pain returned. She continues to have constant left-sided chest pain but states it is not as severe as initially, a 6/10. She states she has had similar pain to this before in 2009 at which  time she underwent heart craterization that induced RCA spasm.  Of note, the patient states she has not been feeling well since Wednesday, when asked about her symptoms she states she has a lot of stress, specifically her son recently underwent resection of a rare GI tumor and she was attempting to file for divorce (her husband is in  prison in [place]), however he refused to sign the paperwork.   Review Of Systems: Per HPI with the following additions: None Otherwise 12 point review of systems was performed and was unremarkable.  Patient Active Problem List   Diagnosis Date Noted  . Chest pain 04/12/2014  . Essential hypertension 11/19/2013  . Arthralgia of hip 09/19/2013  . Acute low back pain 08/10/2013  . Headache(784.0) 08/07/2013  . Left-sided weakness 08/07/2013  . Chronic constipation 05/07/2013  . Personal history of colonic polyps-sessile serrated adenoma 02/23/2013  . HNP (herniated nucleus pulposus), lumbar 12/01/2012  . Lumbar radicular syndrome 12/01/2012  . Seasonal allergies 11/30/2012  . Intervertebral disc protrusion 11/06/2012  . Situational anxiety 09/05/2012  . Nocturnal leg cramps 09/05/2012  . Vitamin D insufficiency 06/30/2012  . Unilateral hearing loss 06/30/2012  . Vestibular dizziness 06/30/2012  . Left hip pain 06/16/2012  . Fibromuscular dysplasia 08/13/2011  . Poorly-controlled hypertension   . History of CVA (cerebrovascular accident)   . Coronary vasospasm   . Palpitations 10/26/2010  . HYPOTHYROIDISM 04/10/2010  . HYPERLIPIDEMIA 04/10/2010  . Anxiety state, unspecified 04/10/2010  . DEPRESSION 04/10/2010  . MIGRAINE HEADACHE 04/10/2010  . RENAL ARTERY STENOSIS 04/10/2010  . FIBROMYALGIA 04/10/2010  . Status post lumbar surgery 04/10/2010  . BACK PAIN 04/09/2010   Past Medical History: Past Medical History  Diagnosis Date  . Migraine headache   . Chest pain     a. No angiographic CAD by cath 2009 in [place]. There was catheter-induced RCA vasospasm versus microvascular obstruction. bCarlton Adam myoview 04/2010 - EF 79%, normal perfusion.;  c. ETT-Myoview 7/13: no ischemia, EF 77%, submax exercise  . Renal artery stenosis     a. Due to fibromuscular dysplasia. b. Renal stent 2009. c. Studies:  CTA abdomen (1/13) with evidence for fibromuscular dysplasia, right  renal artery stent appears patent. Renal artery dopplers (2/13) with FMD but no evidence for signficant stenosis.   . Hypothyroidism 2003    Graves disease s/p radio-iodine treatment in 2003   . Fibromuscular dysplasia 2009    a. Renal artery stenosis due to this. b. Carotid dopplers 07/2011 c/w FMD but no significant stenosis  . Tachycardia 2000    10/27/10-Holter -NSR with occ sinus tachy-rare PACs, no sig arrhythmia  . Hx of echocardiogram     a. Echo (1/13): EF 08-67%, grade 1 diastolic dysfunction  . HTN (hypertension) 1988    a. Likely related to RAS; urinary metanephrines and plasma renin/aldosterone ratio normal. b. H/o HTN urgency 06/2011 after being off BP meds for a number of days  . HLD (hyperlipidemia) 2000  . Myocardial infarction 2009  . Anxiety 2001  . Depression 2001  . Hypertensive crisis 06/29/2011  . Leiomyoma   . Adenomyosis   . Fibromyalgia 1999  . Coronary vasospasm   . Chronic back pain     had 3 epidural injections on 02-14-13  . Hearing loss of left ear     s/p CVA 2006  . Personal history of colonic polyps-sessile serrated adenoma 02/23/2013  . CVA (cerebral vascular accident) 2006    lost hearing on left  . Irritable bowel syndrome   .  Complication of anesthesia     has difficulty voiding after surgery  . H/O Graves' disease 2003    s/p radio-iodine treatment in 2003    Past Surgical History: Past Surgical History  Procedure Laterality Date  . Renal artery stent  2009  . Spine surgery    . Back surgery  2010 and 2011    In CT.  11/19/2008 (possibly a laminectomy) and in May of 2011 she underwent a L2-L3 fusion.  . Robotic assisted lap vaginal hysterectomy  05/13/2008    [facility] hysterectomy  for myomatous uterus   . Colonoscopy    . Colonoscopy    . Cardiac catheterization  2009    states it was normal  . Vaginal hysterectomy    . Knee arthroscopy Left   . Hip surgery Bilateral 1988 and 1989    "scraped head of femur"  . Lumbar  laminectomy/decompression microdiscectomy Left 05/25/2013    Procedure: LUMBAR LAMINECTOMY/DECOMPRESSION MICRODISCECTOMY 1 LEVEL;  Surgeon: Winfield Cunas, MD;  Location: [facility];  Service: Neurosurgery;  Laterality: Left;  LEFT L5S1 microdiskectomy   Social History: History  Substance Use Topics  . Smoking status: Never Smoker   . Smokeless tobacco: Never Used  . Alcohol Use: Yes     Comment: very rarely   Additional social history: No drugs, alcohol, or tobacco use. Attempting to divorce her husband. Jehovah's witness   Please also refer to relevant sections of EMR.  Family History: Family History  Problem Relation Age of Onset  . Heart attack    . Stroke    . Hypertension Mother   . Asthma Mother   . Hyperlipidemia Mother   . Stroke Mother   . Hypertension Father   . Prostate cancer Father   . Heart attack Father   . Hyperlipidemia Father   . Stroke Father   . Hypertension Brother     Died from a massive stroke in setting of severe HTN at 48 (had had MI at 23)  . Heart attack Brother   . Early death Brother 12    stroke   . Hyperlipidemia Brother   . Metabolic syndrome Sister   . Depression Sister   . Hypertension Sister   . Diabetes Maternal Aunt   . Colon cancer Neg Hx   . Stomach cancer Neg Hx    Allergies and Medications: Allergies  Allergen Reactions  . Hydromorphone Hcl Other (See Comments)    Palpitations  . Prochlorperazine Edisylate Other (See Comments)    Causes Seizures   . Sumatriptan Palpitations  . Atorvastatin Other (See Comments)    Joint pain  . Tramadol Anxiety   No current facility-administered medications on file prior to encounter.   Current Outpatient Prescriptions on File Prior to Encounter  Medication Sig Dispense Refill  . aspirin EC 81 MG tablet Take 81 mg by mouth daily.    . cyclobenzaprine (FLEXERIL) 10 MG tablet Take 1 tablet (10 mg total) by mouth 3 (three) times daily as needed for muscle spasms. 30 tablet 0  .  gabapentin (NEURONTIN) 300 MG capsule Take 2 caps (600 mg) in AM and afternoon and 4 caps (1200mg ) at bedtime (Patient taking differently: Take 600-1,200 mg by mouth 2 (two) times daily. Take 2 caps (600 mg) in AM and afternoon and 4 caps (1200mg ) at bedtime) 240 capsule 3  . HYDROcodone-acetaminophen (NORCO/VICODIN) 5-325 MG per tablet Take 1 tablet by mouth 2 (two) times daily as needed for severe pain. 30 tablet  0  . labetalol (NORMODYNE) 200 MG tablet Take 4 tablets (800 mg total) by mouth 2 (two) times daily. 720 tablet 1  . Linaclotide (LINZESS) 290 MCG CAPS capsule Take 1 capsule (290 mcg total) by mouth daily. 31 capsule 11  . meclizine (ANTIVERT) 25 MG tablet Take 1 tablet (25 mg total) by mouth 3 (three) times daily as needed for dizziness. 30 tablet 0  . NIFEdipine (PROCARDIA XL/ADALAT-CC) 90 MG 24 hr tablet Take 1 tablet (90 mg total) by mouth daily. 90 tablet 1  . nitroGLYCERIN (NITROSTAT) 0.4 MG SL tablet Place 1 tablet (0.4 mg total) under the tongue every 5 (five) minutes as needed. x3 doses as needed for chest pain 100 tablet 3  . pantoprazole (PROTONIX) 40 MG tablet Take 1 tablet (40 mg total) by mouth daily. 30 tablet 2  . potassium chloride SA (K-DUR,KLOR-CON) 20 MEQ tablet Take 20 mEq by mouth daily.    . QUEtiapine (SEROQUEL) 100 MG tablet Take 100 mg by mouth at bedtime. Per psychiatrist.    . rosuvastatin (CRESTOR) 40 MG tablet Take 1 tablet (40 mg total) by mouth daily. 30 tablet 3  . sertraline (ZOLOFT) 50 MG tablet Take 50 mg by mouth daily. Per psychiatrist    . spironolactone (ALDACTONE) 50 MG tablet Take 1 tablet (50 mg total) by mouth daily. 90 tablet 1  . traZODone (DESYREL) 50 MG tablet Take 50 mg by mouth at bedtime.    Marland Kitchen levothyroxine (SYNTHROID, LEVOTHROID) 200 MCG tablet TAKE ONE TABLET BY MOUTH AT BEDTIME 30 tablet 3    Objective: BP 141/88 mmHg  Pulse 74  Temp(Src) 98.7 F (37.1 C) (Oral)  Resp 19  Ht 5\' 4"  (1.626 m)  Wt 134 lb 14.7 oz (61.2 kg)  BMI  23.15 kg/m2  SpO2 98% Exam: General: Lying in bed in NAD. Appears stated age. HEENT: Atraumatic, normocephalic. PERRL. MMM, oropharynx clear. No nasal dischrge  Cardiovascular: RRR, no m/r/g noted. 2+DP and radial pulses bilaterally. No JVD Respiratory: CTAB without wheezing, crackles, or rhonchi noted Abdomen: +BS, soft, non-tender, non-distend. Extremities: No edema, gross deformities Skin: No rashes noted Neuro: A&Ox4. Speech clear. No gross neurologic deficit.  Labs and Imaging: CBC BMET   Recent Labs Lab 04/11/14 2300  WBC 8.9  HGB 14.4  HCT 40.3  PLT 318    Recent Labs Lab 04/11/14 2300  NA 140  K 3.9  CL 102  CO2 25  BUN 17  CREATININE 1.34*  GLUCOSE 103*  CALCIUM 9.4     Risk Stratification Labs  TSH    Component Value Date/Time   TSH 3.219 08/10/2013 1622   Hemoglobin A1C    Component Value Date/Time   HGBA1C 5.2 10/15/2012 0530   Lipid Panel     Component Value Date/Time   CHOL 266* 01/25/2014 0820   TRIG 141.0 01/25/2014 0820   HDL 71.80 01/25/2014 0820   CHOLHDL 4 01/25/2014 0820   VLDL 28.2 01/25/2014 0820   LDLCALC 166* 01/25/2014 0820    D-dimer 0.36  CTA: No evidence of arterial pathology. No evidence of dissection or widespread atherosclerotic change. The patient does have a renal artery stent in the midportion of the right renal artery. The right kidney is slightly smaller than the left and there are a few areas of cortical atrophy. No distinct cause of the presenting symptoms is identified.  CXR: Normal chest   Archie Patten, MD 04/12/2014, 3:56 AM PGY-1, [place] [facility] pager: [phone], text pages  welcome  I have seen and evaluated the above patient.  I agree with the note. Addendum in blue.  [facility] PGY-3 Pager: [phone]

## 2014-04-12 NOTE — ED Notes (Signed)
Patient ambulated to the BR and was unsteady on her feet.  Assist times 1

## 2014-04-12 NOTE — ED Provider Notes (Signed)
CSN: [id number]     Arrival date & time 04/11/14  2238 History   First MD Initiated Contact with Patient 04/11/14 2258     Chief Complaint  Patient presents with  . Chest Pain     (Consider location/radiation/quality/duration/timing/severity/associated sxs/prior Treatment) HPI Comments: 53 y.o. female with a history of fibromuscular dysplasia of the renal arteries, HTN, and possible coronary vasospasm versus microvascular angina in 2009 comes in with cc of chest pain. Chest pain started in the day time, however, it got worse prior to arrival. Chest pain is located midsternal, and is radiating to the back. There is associated nausea, diaphoresis. Pain is midsternal and radiates to the back. PT also has pain in the  Neck and her gums.   Patient is a 53 y.o. female presenting with chest pain. The history is provided by the patient.  Chest Pain Associated symptoms: back pain, diaphoresis, dizziness, nausea and shortness of breath   Associated symptoms: no abdominal pain, no cough, no headache and not vomiting     Past Medical History  Diagnosis Date  . Migraine headache   . Chest pain     a. No angiographic CAD by cath 2009 in [place]. There was catheter-induced RCA vasospasm versus microvascular obstruction. bCarlton Adam myoview 04/2010 - EF 79%, normal perfusion.;  c. ETT-Myoview 7/13: no ischemia, EF 77%, submax exercise  . Renal artery stenosis     a. Due to fibromuscular dysplasia. b. Renal stent 2009. c. Studies:  CTA abdomen (1/13) with evidence for fibromuscular dysplasia, right renal artery stent appears patent. Renal artery dopplers (2/13) with FMD but no evidence for signficant stenosis.   . Hypothyroidism 2003    Graves disease s/p radio-iodine treatment in 2003   . Fibromuscular dysplasia 2009    a. Renal artery stenosis due to this. b. Carotid dopplers 07/2011 c/w FMD but no significant stenosis  . Tachycardia 2000    10/27/10-Holter -NSR with occ sinus tachy-rare PACs, no  sig arrhythmia  . Hx of echocardiogram     a. Echo (1/13): EF 57-01%, grade 1 diastolic dysfunction  . HTN (hypertension) 1988    a. Likely related to RAS; urinary metanephrines and plasma renin/aldosterone ratio normal. b. H/o HTN urgency 06/2011 after being off BP meds for a number of days  . HLD (hyperlipidemia) 2000  . Myocardial infarction 2009  . Anxiety 2001  . Depression 2001  . Hypertensive crisis 06/29/2011  . Leiomyoma   . Adenomyosis   . Fibromyalgia 1999  . Coronary vasospasm   . Chronic back pain     had 3 epidural injections on 02-14-13  . Hearing loss of left ear     s/p CVA 2006  . Personal history of colonic polyps-sessile serrated adenoma 02/23/2013  . CVA (cerebral vascular accident) 2006    lost hearing on left  . Irritable bowel syndrome   . Complication of anesthesia     has difficulty voiding after surgery  . H/O Graves' disease 2003    s/p radio-iodine treatment in 2003    Past Surgical History  Procedure Laterality Date  . Renal artery stent  2009  . Spine surgery    . Back surgery  2010 and 2011    In CT.  11/19/2008 (possibly a laminectomy) and in May of 2011 she underwent a L2-L3 fusion.  . Robotic assisted lap vaginal hysterectomy  05/13/2008    Lyon hysterectomy  for myomatous uterus   . Colonoscopy    . Colonoscopy    .  Cardiac catheterization  2009    states it was normal  . Vaginal hysterectomy    . Knee arthroscopy Left   . Hip surgery Bilateral 1988 and 1989    "scraped head of femur"  . Lumbar laminectomy/decompression microdiscectomy Left 05/25/2013    Procedure: LUMBAR LAMINECTOMY/DECOMPRESSION MICRODISCECTOMY 1 LEVEL;  Surgeon: Winfield Cunas, MD;  Location: [facility];  Service: Neurosurgery;  Laterality: Left;  LEFT L5S1 microdiskectomy   Family History  Problem Relation Age of Onset  . Heart attack    . Stroke    . Hypertension Mother   . Asthma Mother   . Hyperlipidemia Mother   . Stroke Mother   . Hypertension  Father   . Prostate cancer Father   . Heart attack Father   . Hyperlipidemia Father   . Stroke Father   . Hypertension Brother   . Heart attack Brother 56  . Early death Brother 73    massive stroke   . Hyperlipidemia Brother   . Metabolic syndrome Sister   . Depression Sister   . Hypertension Sister   . Diabetes Maternal Aunt   . Colon cancer Neg Hx   . Stomach cancer Neg Hx    History  Substance Use Topics  . Smoking status: Never Smoker   . Smokeless tobacco: Never Used  . Alcohol Use: Yes     Comment: very rarely   OB History    No data available     Review of Systems  Constitutional: Positive for diaphoresis. Negative for activity change.  HENT: Negative for facial swelling.   Respiratory: Positive for chest tightness and shortness of breath. Negative for cough and wheezing.   Cardiovascular: Positive for chest pain.  Gastrointestinal: Positive for nausea. Negative for vomiting, abdominal pain, diarrhea, constipation, blood in stool and abdominal distention.  Genitourinary: Negative for hematuria, flank pain and difficulty urinating.  Musculoskeletal: Positive for back pain and neck pain.  Skin: Negative for color change.  Neurological: Positive for dizziness and light-headedness. Negative for speech difficulty and headaches.  Hematological: Does not bruise/bleed easily.  Psychiatric/Behavioral: Negative for confusion.      Allergies  Hydromorphone hcl; Prochlorperazine edisylate; Sumatriptan; Atorvastatin; and Tramadol  Home Medications   Prior to Admission medications   Medication Sig Start Date End Date Taking? Authorizing Provider  aspirin EC 81 MG tablet Take 81 mg by mouth daily.   Yes Historical Provider, MD  cyclobenzaprine (FLEXERIL) 10 MG tablet Take 1 tablet (10 mg total) by mouth 3 (three) times daily as needed for muscle spasms. 12/03/13  Yes Lind Covert, MD  gabapentin (NEURONTIN) 300 MG capsule Take 2 caps (600 mg) in AM and afternoon  and 4 caps (1200mg ) at bedtime Patient taking differently: Take 600-1,200 mg by mouth 2 (two) times daily. Take 2 caps (600 mg) in AM and afternoon and 4 caps (1200mg ) at bedtime 12/04/13  Yes Lind Covert, MD  HYDROcodone-acetaminophen (NORCO/VICODIN) 5-325 MG per tablet Take 1 tablet by mouth 2 (two) times daily as needed for severe pain. 12/03/13  Yes Lind Covert, MD  labetalol (NORMODYNE) 200 MG tablet Take 4 tablets (800 mg total) by mouth 2 (two) times daily. 04/03/13  Yes Larey Dresser, MD  levothyroxine (SYNTHROID, LEVOTHROID) 200 MCG tablet Take 200 mcg by mouth daily before breakfast.   Yes Historical Provider, MD  Linaclotide (LINZESS) 290 MCG CAPS capsule Take 1 capsule (290 mcg total) by mouth daily. 06/19/13  Yes Minerva Ends, MD  meclizine (  ANTIVERT) 25 MG tablet Take 1 tablet (25 mg total) by mouth 3 (three) times daily as needed for dizziness. 09/28/13  Yes John L Molpus, MD  NIFEdipine (PROCARDIA XL/ADALAT-CC) 90 MG 24 hr tablet Take 1 tablet (90 mg total) by mouth daily. 04/03/13  Yes Larey Dresser, MD  nitroGLYCERIN (NITROSTAT) 0.4 MG SL tablet Place 1 tablet (0.4 mg total) under the tongue every 5 (five) minutes as needed. x3 doses as needed for chest pain 04/03/13  Yes Larey Dresser, MD  pantoprazole (PROTONIX) 40 MG tablet Take 1 tablet (40 mg total) by mouth daily. 11/30/12  Yes Scott T Kathlen Mody, PA-C  potassium chloride SA (K-DUR,KLOR-CON) 20 MEQ tablet Take 20 mEq by mouth daily. 05/29/12  Yes Larey Dresser, MD  QUEtiapine (SEROQUEL) 100 MG tablet Take 100 mg by mouth at bedtime. Per psychiatrist.   Yes Historical Provider, MD  rosuvastatin (CRESTOR) 40 MG tablet Take 1 tablet (40 mg total) by mouth daily. 01/28/14  Yes Larey Dresser, MD  sertraline (ZOLOFT) 50 MG tablet Take 50 mg by mouth daily. Per psychiatrist   Yes Historical Provider, MD  spironolactone (ALDACTONE) 50 MG tablet Take 1 tablet (50 mg total) by mouth daily. 04/03/13  Yes Larey Dresser, MD  traZODone (DESYREL) 50 MG tablet Take 50 mg by mouth at bedtime.   Yes Historical Provider, MD  levothyroxine (SYNTHROID, LEVOTHROID) 200 MCG tablet TAKE ONE TABLET BY MOUTH AT BEDTIME 03/26/14   Patrecia Pour, MD   BP 106/62 mmHg  Pulse 76  Temp(Src) 97.9 F (36.6 C) (Oral)  Resp 17  Ht 5\' 4"  (1.626 m)  Wt 140 lb 10.5 oz (63.8 kg)  BMI 24.13 kg/m2  SpO2 97% Physical Exam  Constitutional: She is oriented to person, place, and time. She appears well-developed and well-nourished.  HENT:  Head: Normocephalic and atraumatic.  Eyes: EOM are normal. Pupils are equal, round, and reactive to light.  Neck: Neck supple.  Cardiovascular: Normal rate, regular rhythm, normal heart sounds and intact distal pulses.   No murmur heard. Pulmonary/Chest: Effort normal. No respiratory distress.  Abdominal: Soft. She exhibits no distension. There is no tenderness. There is no rebound and no guarding.  Neurological: She is alert and oriented to person, place, and time.  Skin: Skin is warm and dry.  Nursing note and vitals reviewed.   ED Course  Procedures (including critical care time) Labs Review Labs Reviewed  BASIC METABOLIC PANEL - Abnormal; Notable for the following:    Glucose, Bld 103 (*)    Creatinine, Ser 1.34 (*)    GFR calc non Af Amer 45 (*)    GFR calc Af Amer 52 (*)    All other components within normal limits  CREATININE, SERUM - Abnormal; Notable for the following:    Creatinine, Ser 1.13 (*)    GFR calc non Af Amer 55 (*)    GFR calc Af Amer 64 (*)    All other components within normal limits  CBC  TROPONIN I  D-DIMER, QUANTITATIVE  TROPONIN I  HEMOGLOBIN A1C  TROPONIN I  CBC  I-STAT TROPOININ, ED    Imaging Review Dg Chest 2 View  04/11/2014   CLINICAL DATA:  Left-sided chest pain beginning today. Some shortness of breath. History of hypertension.  EXAM: CHEST  2 VIEW  COMPARISON:  01/13/2013  FINDINGS: Heart size is normal. Mediastinal shadows are  normal. The lungs are clear. No bronchial thickening. No infiltrate, mass, effusion or collapse. Pulmonary  vascularity is normal. No bony abnormality.  IMPRESSION: Normal chest   Electronically Signed   By: Nelson Chimes M.D.   On: 04/11/2014 23:29   Ct Angio Abdomen W/cm &/or Wo Contrast  04/12/2014   CLINICAL DATA:  Left-sided chest pain radiating to the left arm and back. Nausea and vomiting.  EXAM: CT ANGIOGRAPHY CHEST AND ABDOMEN  TECHNIQUE: Multidetector CT imaging of the chest and abdomen was performed using the standard protocol during bolus administration of intravenous contrast. Multiplanar CT image reconstructions and MIPs were obtained to evaluate the vascular anatomy.  CONTRAST:  100 cc Omnipaque 350  COMPARISON:  Chest radiography same day  FINDINGS: CTA CHEST FINDINGS  Precontrast scanning is negative. There is no aortic pathology. No evidence of atherosclerosis or dissection. The pulmonary arterial tree appears normal. The branching pattern of the brachiocephalic vessels from the arch is normal. No coronary artery calcification is seen. There is no pleural or pericardial fluid. No hilar or mediastinal mass. The lungs are clear bilaterally. No chest wall abnormality is seen.  Review of the MIP images confirms the above findings.  CTA ABDOMEN FINDINGS  The abdominal aorta and its branch vessels appear entirely normal, with the exception that the patient has a renal artery stent on the right. There is no calcified atherosclerosis seen. Both iliac systems appear widely patent.  There is fatty change of the liver. No focal liver lesion. No calcified gallstones. The spleen is normal. The pancreas is normal. The adrenal glands are normal. The kidneys are normal except for mild focal atrophy in the midportion of the right kidney. The right kidney is smaller than the left kidney is well. The IVC is normal. No retroperitoneal adenopathy. There has been previous lumbar diskectomy, decompression and fusion.  There is no free fluid. The bladder appears normal. Previous hysterectomy. No acute bowel pathology is seen.  Review of the MIP images confirms the above findings.  IMPRESSION: No evidence of arterial pathology. No evidence of dissection or widespread atherosclerotic change. The patient does have a renal artery stent in the midportion of the right renal artery. The right kidney is slightly smaller than the left and there are a few areas of cortical atrophy.  No distinct cause of the presenting symptoms is identified.   Electronically Signed   By: Nelson Chimes M.D.   On: 04/12/2014 03:12   Ct Angio Chest Aorta W/cm &/or Wo/cm  04/12/2014   CLINICAL DATA:  Left-sided chest pain radiating to the left arm and back. Nausea and vomiting.  EXAM: CT ANGIOGRAPHY CHEST AND ABDOMEN  TECHNIQUE: Multidetector CT imaging of the chest and abdomen was performed using the standard protocol during bolus administration of intravenous contrast. Multiplanar CT image reconstructions and MIPs were obtained to evaluate the vascular anatomy.  CONTRAST:  100 cc Omnipaque 350  COMPARISON:  Chest radiography same day  FINDINGS: CTA CHEST FINDINGS  Precontrast scanning is negative. There is no aortic pathology. No evidence of atherosclerosis or dissection. The pulmonary arterial tree appears normal. The branching pattern of the brachiocephalic vessels from the arch is normal. No coronary artery calcification is seen. There is no pleural or pericardial fluid. No hilar or mediastinal mass. The lungs are clear bilaterally. No chest wall abnormality is seen.  Review of the MIP images confirms the above findings.  CTA ABDOMEN FINDINGS  The abdominal aorta and its branch vessels appear entirely normal, with the exception that the patient has a renal artery stent on the right. There is no calcified  atherosclerosis seen. Both iliac systems appear widely patent.  There is fatty change of the liver. No focal liver lesion. No calcified gallstones. The  spleen is normal. The pancreas is normal. The adrenal glands are normal. The kidneys are normal except for mild focal atrophy in the midportion of the right kidney. The right kidney is smaller than the left kidney is well. The IVC is normal. No retroperitoneal adenopathy. There has been previous lumbar diskectomy, decompression and fusion. There is no free fluid. The bladder appears normal. Previous hysterectomy. No acute bowel pathology is seen.  Review of the MIP images confirms the above findings.  IMPRESSION: No evidence of arterial pathology. No evidence of dissection or widespread atherosclerotic change. The patient does have a renal artery stent in the midportion of the right renal artery. The right kidney is slightly smaller than the left and there are a few areas of cortical atrophy.  No distinct cause of the presenting symptoms is identified.   Electronically Signed   By: Nelson Chimes M.D.   On: 04/12/2014 03:12     EKG Interpretation   Date/Time:  Thursday April 11 2014 22:46:26 EST Ventricular Rate:  106 PR Interval:  173 QRS Duration: 83 QT Interval:  364 QTC Calculation: 483 R Axis:   54 Text Interpretation:  Sinus tachycardia Biatrial enlargement Borderline  repolarization abnormality artiface in leads I-III No acute findings, or  new changes appreciated Confirmed by Kathrynn Humble, MD, Thelma Comp [phone]) on  04/11/2014 11:00:22 PM      CRITICAL CARE Performed by: Varney Biles   Total critical care time: 50 min  Critical care time was exclusive of separately billable procedures and treating other patients.  Critical care was necessary to treat or prevent imminent or life-threatening deterioration.  Critical care was time spent personally by me on the following activities: development of treatment plan with patient and/or surrogate as well as nursing, discussions with consultants, evaluation of patient's response to treatment, examination of patient, obtaining history from  patient or surrogate, ordering and performing treatments and interventions, ordering and review of laboratory studies, ordering and review of radiographic studies, pulse oximetry and re-evaluation of patient's condition.    MDM  Clinical impression:  1. Acute chest pain 2. Ischemic chest pain   Pt comes in with cc of chest pain. Pt has hx of fibromuscular dysplasia. She has what appears to be coronary vasospasms in the past, and is following Cardiology here.  Pt has concerning quality to her chest pain. Pain is midsternal, radiating to her back, is severe, and has nausea, and diophoresis. Troponin is neg. Pt has persistent chest pain. Normal vascular exam -however, with fibromuscular dysplasia, and persistent pain, CT was ordered to rule out dissection, and it is neg.  We will start heparin, as the pain has some ischemic character to it. Will request admission. Heparin gtt started, nitro patch ordered too.     Varney Biles, MD 04/13/14 1315

## 2014-04-12 NOTE — ED Provider Notes (Signed)
EKG performed while waiting bed placement.  Normal axis, sinus, heart rate 76, normal QT, no acute ST elevation or ST depression.  [name] Rowe 11:57 AM   [name] Clonts, MD 04/12/14 [phone]

## 2014-04-12 NOTE — ED Notes (Signed)
Report from brittany, rn.  Pt care assumed.  Pt reports 6/10 chest pain and 10/10 headache, nauseated at this time, but better after zofran

## 2014-04-12 NOTE — Progress Notes (Signed)
ANTICOAGULATION CONSULT NOTE - Initial Consult  Pharmacy Consult for heparin Indication: chest pain/ACS  Allergies  Allergen Reactions  . Hydromorphone Hcl Other (See Comments)    Palpitations  . Prochlorperazine Edisylate Other (See Comments)    Causes Seizures   . Sumatriptan Palpitations  . Atorvastatin Other (See Comments)    Joint pain  . Tramadol Anxiety    Patient Measurements: Height: 5\' 4"  (162.6 cm) Weight: 134 lb 14.7 oz (61.2 kg) IBW/kg (Calculated) : 54.7  Vital Signs: Temp: 98.7 F (37.1 C) (11/05 2252) Temp Source: Oral (11/05 2252) BP: 145/89 mmHg (11/06 0300) Pulse Rate: 83 (11/06 0300)  Labs:  Recent Labs  04/11/14 2300  HGB 14.4  HCT 40.3  PLT 318  CREATININE 1.34*  TROPONINI <0.30    Estimated Creatinine Clearance: 42.4 mL/min (by C-G formula based on Cr of 1.34).   Medical History: Past Medical History  Diagnosis Date  . Migraine headache   . Chest pain     a. No angiographic CAD by cath 2009 in [place]. There was catheter-induced RCA vasospasm versus microvascular obstruction. bCarlton Adam myoview 04/2010 - EF 79%, normal perfusion.;  c. ETT-Myoview 7/13: no ischemia, EF 77%, submax exercise  . Renal artery stenosis     a. Due to fibromuscular dysplasia. b. Renal stent 2009. c. Studies:  CTA abdomen (1/13) with evidence for fibromuscular dysplasia, right renal artery stent appears patent. Renal artery dopplers (2/13) with FMD but no evidence for signficant stenosis.   . Hypothyroidism 2003    Graves disease s/p radio-iodine treatment in 2003   . Fibromuscular dysplasia 2009    a. Renal artery stenosis due to this. b. Carotid dopplers 07/2011 c/w FMD but no significant stenosis  . Tachycardia 2000    10/27/10-Holter -NSR with occ sinus tachy-rare PACs, no sig arrhythmia  . Hx of echocardiogram     a. Echo (1/13): EF 31-59%, grade 1 diastolic dysfunction  . HTN (hypertension) 1988    a. Likely related to RAS; urinary metanephrines and  plasma renin/aldosterone ratio normal. b. H/o HTN urgency 06/2011 after being off BP meds for a number of days  . HLD (hyperlipidemia) 2000  . Myocardial infarction 2009  . Anxiety 2001  . Depression 2001  . Hypertensive crisis 06/29/2011  . Leiomyoma   . Adenomyosis   . Fibromyalgia 1999  . Coronary vasospasm   . Chronic back pain     had 3 epidural injections on 02-14-13  . Hearing loss of left ear     s/p CVA 2006  . Personal history of colonic polyps-sessile serrated adenoma 02/23/2013  . CVA (cerebral vascular accident) 2006    lost hearing on left  . Irritable bowel syndrome   . Complication of anesthesia     has difficulty voiding after surgery  . H/O Graves' disease 2003    s/p radio-iodine treatment in 2003     Assessment: 53yo female c/o CP associated w/ N/V and diaphoresis, initial troponin and D-dimer negative, to begin heparin.  Goal of Therapy:  Heparin level 0.3-0.7 units/ml Monitor platelets by anticoagulation protocol: Yes   Plan:  Will give heparin 3000 units x1 and increase heparin gtt at 750 units/hr and monitor heparin levels and CBC.  Wynona Neat, PharmD, BCPS  04/12/2014,3:35 AM

## 2014-04-12 NOTE — Discharge Summary (Signed)
[facility] Discharge Summary  Patient name: [name] Rowe Medical record number: [id number] Date of birth: 20-Oct-1960 Age: 53 y.o. Gender: female Date of Admission: 04/11/2014  Date of Discharge: 04/16/14 Admitting Physician: Lupita Dawn, MD  Primary Care Provider: Vance Gather, MD Consultants: Cardiology   Indication for Hospitalization: chest pain   Discharge Diagnoses/Problem List:  Chest pain Anxiety  Diplopia Hypertension  Fibromuscular dysplasia   Hyperlipidemia  Hypothyroidism  Arthralgia of hip   Disposition: Home  Discharge Condition: Stable, improved    Brief Hospital Course:  [name] Rowe is a 53 y.o. female presenting with left sided chest pain with radiation to the left axilla and back, diaphoresis, dizziness, SOB, and tingling of the hands bilaterally.  She admits to very stressful situations at home with a sick son and a husband who is incarcerated who refused to sign divorce papers. PMH is significant for possible coronary vasospasm vs mircorvascular angina, fibromuscular dysplasia, HTN, depression, and anxiety.  She was admitted for ACS work-up. Initial EKG was not concerning for ischemia. Troponins were negative x3. Given the concern for PE, D-dimer and CTA was obtained and negative. Repeat EKG was unchanged. Cardiology was consulted who wished to perform a Hudson which revealed no ischemia/infarct. She was cleared by cardiology who stated their office would contact her with a f/u appointment with Dr. Aundra Dubin. Given patient declaration of being stressed and from previous outpatient notes that anxiety may be playing a role in her symptoms, she was started on BusPar in addition to her home regimen of Zoloft.   Unfortunately, the patient developed binocular diplopia on 11/8.  She was noted to have horizontal nystagmus, clonus bilaterally in the feet, and 3+ patellar reflexes. A MRI in 08/2013 revealed mild periventricular and subcortical  white matter changes are advanced for age. The finding is nonspecific but can be seen in the setting of chronic microvascular ischemia, a demyelinating process such as multiple sclerosis, vasculitis, complicated migraine headaches, or as the sequelae of a prior infectious or inflammatory process. Given concerns for MS, neurology was consulted and an MRI was preformed which revealed mild chronic microvascular ischemic change in the white matter with no change from the prior MRI. Neurology did not feel this was secondary to [place] and recommended an opthalmology evaluation. Her TSH was elevated to 36.45 which may be contributing to both her diplopia and anxiety.   Issues for Follow Up:  --Stress taking Synthroid at lunchtime, away from any other medications: from contacting the patient' pharmacy, she has not been refilling her medication on a regular basis. Repeat TSH in approximately 1 month. -- Consider outpatient referral to opthalmology given diplopia  -- Consider outpatient follow up with neurology (patient saw a neurology hospitalist therefore there was no way to set up f/u while she was inpatient.) -- Patient not regularly taking her Crestor she states her pharmacy told her the insurance company wouldn't pay for it. She had arthralgias with Lipitor. I stressed the importance of getting Crestor refilled and stated that if the pharmacy said the insurance company wouldn't cover it, then she should have the pharmacy contact her PCP for a PA.  Significant Procedures: None  Significant Labs and Imaging:   Recent Labs Lab 04/11/14 2300 04/12/14 0828  WBC 8.9 7.6  HGB 14.4 13.8  HCT 40.3 40.4  PLT 318 294    Recent Labs Lab 04/11/14 2300 04/12/14 0828  NA 140  --   K 3.9  --   CL 102  --  CO2 25  --   GLUCOSE 103*  --   BUN 17  --   CREATININE 1.34* 1.13*  CALCIUM 9.4  --    D-dimer 0.36  CTA: No evidence of arterial pathology. No evidence of dissection or widespread atherosclerotic  change. The patient does have a renal artery stent in the midportion of the right renal artery. The right kidney is slightly smaller than the left and there are a few areas of cortical atrophy. No distinct cause of the presenting symptoms is identified.  CXR: Normal chest   MRI: Mild chronic microvascular ischemic change in the white matter. No acute infarct or mass. No change from the prior MRI 08/09/2013  Results/Tests Pending at Time of Discharge: None   Discharge Medications:    Medication List    TAKE these medications        aspirin EC 81 MG tablet  Take 81 mg by mouth daily.     busPIRone 7.5 MG tablet  Commonly known as:  BUSPAR  Take 1 tablet (7.5 mg total) by mouth 2 (two) times daily.     cyclobenzaprine 10 MG tablet  Commonly known as:  FLEXERIL  Take 1 tablet (10 mg total) by mouth 3 (three) times daily as needed for muscle spasms.     gabapentin 300 MG capsule  Commonly known as:  NEURONTIN  Take 2 caps (600 mg) in AM and afternoon and 4 caps (1200mg ) at bedtime     HYDROcodone-acetaminophen 5-325 MG per tablet  Commonly known as:  NORCO/VICODIN  Take 1 tablet by mouth 2 (two) times daily as needed for severe pain.     labetalol 200 MG tablet  Commonly known as:  NORMODYNE  Take 4 tablets (800 mg total) by mouth 2 (two) times daily.     levothyroxine 200 MCG tablet  Commonly known as:  SYNTHROID, LEVOTHROID  Take 200 mcg by mouth daily before breakfast.     Linaclotide 290 MCG Caps capsule  Commonly known as:  LINZESS  Take 1 capsule (290 mcg total) by mouth daily.     meclizine 25 MG tablet  Commonly known as:  ANTIVERT  Take 1 tablet (25 mg total) by mouth 3 (three) times daily as needed for dizziness.     NIFEdipine 90 MG 24 hr tablet  Commonly known as:  PROCARDIA XL/ADALAT-CC  Take 1 tablet (90 mg total) by mouth daily.     nitroGLYCERIN 0.4 MG SL tablet  Commonly known as:  NITROSTAT  Place 1 tablet (0.4 mg total) under the tongue every 5  (five) minutes as needed. x3 doses as needed for chest pain     pantoprazole 40 MG tablet  Commonly known as:  PROTONIX  Take 1 tablet (40 mg total) by mouth daily.     potassium chloride SA 20 MEQ tablet  Commonly known as:  K-DUR,KLOR-CON  Take 20 mEq by mouth daily.     QUEtiapine 100 MG tablet  Commonly known as:  SEROQUEL  Take 100 mg by mouth at bedtime. Per psychiatrist.     rosuvastatin 40 MG tablet  Commonly known as:  CRESTOR  Take 1 tablet (40 mg total) by mouth daily.     sertraline 50 MG tablet  Commonly known as:  ZOLOFT  Take 50 mg by mouth daily. Per psychiatrist     spironolactone 50 MG tablet  Commonly known as:  ALDACTONE  Take 1 tablet (50 mg total) by mouth daily.     traZODone 50  MG tablet  Commonly known as:  DESYREL  Take 50 mg by mouth at bedtime.        Discharge Instructions: Please refer to Patient Instructions section of EMR for full details.  Patient was counseled important signs and symptoms that should prompt return to medical care, changes in medications, dietary instructions, activity restrictions, and follow up appointments.   Follow-Up Appointments: Follow-up Information    Follow up with Vance Gather, MD On 04/22/2014.   Specialty:  Family Medicine   Why:  at 2:15pm for a hospital follow up   Contact information:   [place] [address] [phone]       Archie Patten, MD 04/16/2014, 6:11 PM PGY-1, [place]

## 2014-04-12 NOTE — ED Notes (Signed)
Pt vomiting at this time clear liquid, zofran administered.

## 2014-04-12 NOTE — ED Notes (Signed)
Patient transported from [place] to [facility]. Belongings placed in bag and kept with patient. Pt. Endorses nausea. Given emesis bag and alcohol pad to smell. Nausea persists.  Report given to [facility] RN by [place] RN

## 2014-04-12 NOTE — ED Notes (Signed)
Patient transported to CT 

## 2014-04-12 NOTE — Progress Notes (Signed)
Patient c/o of nausea with 1 episode of emesis , MD E.Adama made aware, 1 x order given  for Ativan 1mg  IV. Will continue to monitor patient.

## 2014-04-12 NOTE — Consult Note (Signed)
CONSULT NOTE  Date: 04/12/2014               Patient Name:  [name] Rowe MRN: [id number]  DOB: [birth year] Age / Sex: 53 y.o., female        PCP: Vance Gather Primary Cardiologist: Aundra Dubin            Referring Physician: Cleora Fleet              Reason for Consult: Chest pain            History of Present Illness: Patient is a 53 y.o. female with a PMHx of CP ( possible coronary vasospasm vs. Microvascular angina) , fibromuscular dysplasia of renal arteries, HTN , who was admitted to [facility] on 04/11/2014 for evaluation of chest pain . She has had a renal artery stent placed in 2009.   She has had chest pain for many years.  Cath in 2009 in [place] revealed normal coronaries.   Stress Myoivew ( submaximal )  12/16/11 revealed no ischemia and an EF of 77%   She has been tried on Ranexa but did not tolerate it.   She started feeling poorly 2 days ago.  Felt like she would vomit She started having CP yesterday,  Had nausea and vomitting this AM.  She had several hours of CP and Troponin levels are negative.   Pain has largely resolved.    Medications: Outpatient medications:  (Not in a hospital admission)  Current medications: Current Facility-Administered Medications  Medication Dose Route Frequency Provider Last Rate Last Dose  . 0.9 %  sodium chloride infusion   Intravenous Continuous Coral Spikes, DO 75 mL/hr at 04/12/14 0636    . acetaminophen (TYLENOL) tablet 650 mg  650 mg Oral Q4H PRN Coral Spikes, DO   650 mg at 04/12/14 7026  . aspirin EC tablet 81 mg  81 mg Oral Daily Jayce G Cook, DO      . gabapentin (NEURONTIN) capsule 600 mg  600 mg Oral Daily Jayce G Cook, DO      . gabapentin (NEURONTIN) tablet 1,200 mg  1,200 mg Oral QHS Jayce G Cook, DO      . iohexol (OMNIPAQUE) 350 MG/ML injection 100 mL  100 mL Intravenous Once PRN Medication Radiologist, MD      . labetalol (NORMODYNE) tablet 800 mg  800 mg Oral BID Coral Spikes, DO      . levothyroxine  (SYNTHROID, LEVOTHROID) tablet 200 mcg  200 mcg Oral QAC breakfast Coral Spikes, DO   200 mcg at 04/12/14 0840  . Linaclotide (LINZESS) capsule 290 mcg  290 mcg Oral Daily Coral Spikes, DO      . LORazepam (ATIVAN) tablet 0.5 mg  0.5 mg Oral Q6H PRN Lupita Dawn, MD      . morphine 2 MG/ML injection 1 mg  1 mg Intravenous Q2H PRN Coral Spikes, DO   1 mg at 04/12/14 0752  . NIFEdipine (PROCARDIA XL/ADALAT-CC) 24 hr tablet 90 mg  90 mg Oral Daily Jayce G Cook, DO      . nitroGLYCERIN (NITROGLYN) 2 % ointment 1 inch  1 inch Topical 4 times per day Varney Biles, MD   1 inch at 04/12/14 0631  . nitroGLYCERIN (NITROSTAT) SL tablet 0.4 mg  0.4 mg Sublingual Q5 min PRN Varney Biles, MD   0.4 mg at 04/11/14 2306  . ondansetron (ZOFRAN) injection 4 mg  4 mg Intravenous Q6H  PRN Coral Spikes, DO   4 mg at 04/12/14 0109  . pantoprazole (PROTONIX) EC tablet 40 mg  40 mg Oral Daily Jayce G Cook, DO      . QUEtiapine (SEROQUEL) tablet 100 mg  100 mg Oral QHS Jayce G Cook, DO      . rosuvastatin (CRESTOR) tablet 40 mg  40 mg Oral Daily Jayce G Cook, DO      . sertraline (ZOLOFT) tablet 50 mg  50 mg Oral Daily Jayce G Cook, DO      . spironolactone (ALDACTONE) tablet 50 mg  50 mg Oral Daily Coral Spikes, DO       Current Outpatient Prescriptions  Medication Sig Dispense Refill  . aspirin EC 81 MG tablet Take 81 mg by mouth daily.    . cyclobenzaprine (FLEXERIL) 10 MG tablet Take 1 tablet (10 mg total) by mouth 3 (three) times daily as needed for muscle spasms. 30 tablet 0  . gabapentin (NEURONTIN) 300 MG capsule Take 2 caps (600 mg) in AM and afternoon and 4 caps (1200mg ) at bedtime (Patient taking differently: Take 600-1,200 mg by mouth 2 (two) times daily. Take 2 caps (600 mg) in AM and afternoon and 4 caps (1200mg ) at bedtime) 240 capsule 3  . HYDROcodone-acetaminophen (NORCO/VICODIN) 5-325 MG per tablet Take 1 tablet by mouth 2 (two) times daily as needed for severe pain. 30 tablet 0  . labetalol  (NORMODYNE) 200 MG tablet Take 4 tablets (800 mg total) by mouth 2 (two) times daily. 720 tablet 1  . levothyroxine (SYNTHROID, LEVOTHROID) 200 MCG tablet Take 200 mcg by mouth daily before breakfast.    . Linaclotide (LINZESS) 290 MCG CAPS capsule Take 1 capsule (290 mcg total) by mouth daily. 31 capsule 11  . meclizine (ANTIVERT) 25 MG tablet Take 1 tablet (25 mg total) by mouth 3 (three) times daily as needed for dizziness. 30 tablet 0  . NIFEdipine (PROCARDIA XL/ADALAT-CC) 90 MG 24 hr tablet Take 1 tablet (90 mg total) by mouth daily. 90 tablet 1  . nitroGLYCERIN (NITROSTAT) 0.4 MG SL tablet Place 1 tablet (0.4 mg total) under the tongue every 5 (five) minutes as needed. x3 doses as needed for chest pain 100 tablet 3  . pantoprazole (PROTONIX) 40 MG tablet Take 1 tablet (40 mg total) by mouth daily. 30 tablet 2  . potassium chloride SA (K-DUR,KLOR-CON) 20 MEQ tablet Take 20 mEq by mouth daily.    . QUEtiapine (SEROQUEL) 100 MG tablet Take 100 mg by mouth at bedtime. Per psychiatrist.    . rosuvastatin (CRESTOR) 40 MG tablet Take 1 tablet (40 mg total) by mouth daily. 30 tablet 3  . sertraline (ZOLOFT) 50 MG tablet Take 50 mg by mouth daily. Per psychiatrist    . spironolactone (ALDACTONE) 50 MG tablet Take 1 tablet (50 mg total) by mouth daily. 90 tablet 1  . traZODone (DESYREL) 50 MG tablet Take 50 mg by mouth at bedtime.    Marland Kitchen levothyroxine (SYNTHROID, LEVOTHROID) 200 MCG tablet TAKE ONE TABLET BY MOUTH AT BEDTIME 30 tablet 3     Allergies  Allergen Reactions  . Hydromorphone Hcl Other (See Comments)    Palpitations  . Prochlorperazine Edisylate Other (See Comments)    Causes Seizures   . Sumatriptan Palpitations  . Atorvastatin Other (See Comments)    Joint pain  . Tramadol Anxiety     Past Medical History  Diagnosis Date  . Migraine headache   . Chest pain  a. No angiographic CAD by cath 2009 in [place]. There was catheter-induced RCA vasospasm versus microvascular  obstruction. bCarlton Adam myoview 04/2010 - EF 79%, normal perfusion.;  c. ETT-Myoview 7/13: no ischemia, EF 77%, submax exercise  . Renal artery stenosis     a. Due to fibromuscular dysplasia. b. Renal stent 2009. c. Studies:  CTA abdomen (1/13) with evidence for fibromuscular dysplasia, right renal artery stent appears patent. Renal artery dopplers (2/13) with FMD but no evidence for signficant stenosis.   . Hypothyroidism 2003    Graves disease s/p radio-iodine treatment in 2003   . Fibromuscular dysplasia 2009    a. Renal artery stenosis due to this. b. Carotid dopplers 07/2011 c/w FMD but no significant stenosis  . Tachycardia 2000    10/27/10-Holter -NSR with occ sinus tachy-rare PACs, no sig arrhythmia  . Hx of echocardiogram     a. Echo (1/13): EF 99-37%, grade 1 diastolic dysfunction  . HTN (hypertension) 1988    a. Likely related to RAS; urinary metanephrines and plasma renin/aldosterone ratio normal. b. H/o HTN urgency 06/2011 after being off BP meds for a number of days  . HLD (hyperlipidemia) 2000  . Myocardial infarction 2009  . Anxiety 2001  . Depression 2001  . Hypertensive crisis 06/29/2011  . Leiomyoma   . Adenomyosis   . Fibromyalgia 1999  . Coronary vasospasm   . Chronic back pain     had 3 epidural injections on 02-14-13  . Hearing loss of left ear     s/p CVA 2006  . Personal history of colonic polyps-sessile serrated adenoma 02/23/2013  . CVA (cerebral vascular accident) 2006    lost hearing on left  . Irritable bowel syndrome   . Complication of anesthesia     has difficulty voiding after surgery  . H/O Graves' disease 2003    s/p radio-iodine treatment in 2003     Past Surgical History  Procedure Laterality Date  . Renal artery stent  2009  . Spine surgery    . Back surgery  2010 and 2011    In CT.  11/19/2008 (possibly a laminectomy) and in May of 2011 she underwent a L2-L3 fusion.  . Robotic assisted lap vaginal hysterectomy  05/13/2008    [place]  hysterectomy  for myomatous uterus   . Colonoscopy    . Colonoscopy    . Cardiac catheterization  2009    states it was normal  . Vaginal hysterectomy    . Knee arthroscopy Left   . Hip surgery Bilateral 1988 and 1989    "scraped head of femur"  . Lumbar laminectomy/decompression microdiscectomy Left 05/25/2013    Procedure: LUMBAR LAMINECTOMY/DECOMPRESSION MICRODISCECTOMY 1 LEVEL;  Surgeon: Winfield Cunas, MD;  Location: [facility];  Service: Neurosurgery;  Laterality: Left;  LEFT L5S1 microdiskectomy    Family History  Problem Relation Age of Onset  . Heart attack    . Stroke    . Hypertension Mother   . Asthma Mother   . Hyperlipidemia Mother   . Stroke Mother   . Hypertension Father   . Prostate cancer Father   . Heart attack Father   . Hyperlipidemia Father   . Stroke Father   . Hypertension Brother     Died from a massive stroke in setting of severe HTN at 18 (had had MI at 25)  . Heart attack Brother   . Early death Brother 39    stroke   . Hyperlipidemia Brother   .  Metabolic syndrome Sister   . Depression Sister   . Hypertension Sister   . Diabetes Maternal Aunt   . Colon cancer Neg Hx   . Stomach cancer Neg Hx     Social History:  reports that she has never smoked. She has never used smokeless tobacco. She reports that she drinks alcohol. She reports that she does not use illicit drugs.   Review of Systems: Constitutional:  denies fever, chills, diaphoresis, appetite change and fatigue.  HEENT: denies photophobia, eye pain, redness, hearing loss, ear pain, congestion, sore throat, rhinorrhea, sneezing, neck pain, neck stiffness and tinnitus.  Respiratory: denies SOB, DOE, cough, chest tightness, and wheezing.  Cardiovascular: admits to chest pain,  Gastrointestinal: admits to nausea, vomiting,  Genitourinary: denies dysuria, urgency, frequency, hematuria, flank pain and difficulty urinating.  Musculoskeletal: denies  myalgias, back pain, joint swelling,  arthralgias and gait problem.   Skin: denies pallor, rash and wound.  Neurological: denies dizziness, seizures, syncope, weakness, light-headedness, numbness and headaches.   Hematological: denies adenopathy, easy bruising, personal or family bleeding history.  Psychiatric/ Behavioral: denies suicidal ideation, mood changes, confusion, nervousness, sleep disturbance and agitation.    Physical Exam: BP 150/85 mmHg  Pulse 85  Temp(Src) 98.7 F (37.1 C) (Oral)  Resp 17  Ht 5\' 4"  (1.626 m)  Wt 134 lb 14.7 oz (61.2 kg)  BMI 23.15 kg/m2  SpO2 100%  Wt Readings from Last 3 Encounters:  04/12/14 134 lb 14.7 oz (61.2 kg)  02/06/14 135 lb (61.236 kg)  12/03/13 135 lb (61.236 kg)    General: Vital signs reviewed and noted. Well-developed, well-nourished, in no acute distress; alert,   Head: Normocephalic, atraumatic, sclera anicteric,   Neck: Supple. Negative for carotid bruits. No JVD   Lungs:  Clear bilaterally, no  wheezes, rales, or rhonchi. Breathing is normal   Heart: RRR with S1 S2. No murmurs, rubs, or gallops   Abdomen:  Soft, non-tender, non-distended with normoactive bowel sounds. No hepatomegaly. No rebound/guarding. No obvious abdominal masses   MSK: Strength and the appear normal for age.   Extremities: No clubbing or cyanosis. No edema.  Distal pedal pulses are 2+ and equal   Neurologic: Alert and oriented X 3. Moves all extremities spontaneously.  Psych: Responds to questions appropriately with a normal affect.     Lab results: Basic Metabolic Panel:  Recent Labs Lab 04/11/14 2300  NA 140  K 3.9  CL 102  CO2 25  GLUCOSE 103*  BUN 17  CREATININE 1.34*  CALCIUM 9.4    Liver Function Tests: No results for input(s): AST, ALT, ALKPHOS, BILITOT, PROT, ALBUMIN in the last 168 hours. No results for input(s): LIPASE, AMYLASE in the last 168 hours. No results for input(s): AMMONIA in the last 168 hours.  CBC:  Recent Labs Lab 04/11/14 2300 04/12/14 0828  WBC  8.9 7.6  HGB 14.4 13.8  HCT 40.3 40.4  MCV 86.3 89.4  PLT 318 294    Cardiac Enzymes:  Recent Labs Lab 04/11/14 2300 04/12/14 0417  TROPONINI <0.30 <0.30    BNP: Invalid input(s): POCBNP  CBG: No results for input(s): GLUCAP in the last 168 hours.  Coagulation Studies: No results for input(s): LABPROT, INR in the last 72 hours.   Other results:  EKG - sinus tach,  Lots of artifact. ? Nonspecific ST abnormality.      Imaging: Dg Chest 2 View  04/11/2014   CLINICAL DATA:  Left-sided chest pain beginning today. Some shortness of breath. History  of hypertension.  EXAM: CHEST  2 VIEW  COMPARISON:  01/13/2013  FINDINGS: Heart size is normal. Mediastinal shadows are normal. The lungs are clear. No bronchial thickening. No infiltrate, mass, effusion or collapse. Pulmonary vascularity is normal. No bony abnormality.  IMPRESSION: Normal chest   Electronically Signed   By: Nelson Chimes M.D.   On: 04/11/2014 23:29   Ct Angio Abdomen W/cm &/or Wo Contrast  04/12/2014   CLINICAL DATA:  Left-sided chest pain radiating to the left arm and back. Nausea and vomiting.  EXAM: CT ANGIOGRAPHY CHEST AND ABDOMEN  TECHNIQUE: Multidetector CT imaging of the chest and abdomen was performed using the standard protocol during bolus administration of intravenous contrast. Multiplanar CT image reconstructions and MIPs were obtained to evaluate the vascular anatomy.  CONTRAST:  100 cc Omnipaque 350  COMPARISON:  Chest radiography same day  FINDINGS: CTA CHEST FINDINGS  Precontrast scanning is negative. There is no aortic pathology. No evidence of atherosclerosis or dissection. The pulmonary arterial tree appears normal. The branching pattern of the brachiocephalic vessels from the arch is normal. No coronary artery calcification is seen. There is no pleural or pericardial fluid. No hilar or mediastinal mass. The lungs are clear bilaterally. No chest wall abnormality is seen.  Review of the MIP images confirms  the above findings.  CTA ABDOMEN FINDINGS  The abdominal aorta and its branch vessels appear entirely normal, with the exception that the patient has a renal artery stent on the right. There is no calcified atherosclerosis seen. Both iliac systems appear widely patent.  There is fatty change of the liver. No focal liver lesion. No calcified gallstones. The spleen is normal. The pancreas is normal. The adrenal glands are normal. The kidneys are normal except for mild focal atrophy in the midportion of the right kidney. The right kidney is smaller than the left kidney is well. The IVC is normal. No retroperitoneal adenopathy. There has been previous lumbar diskectomy, decompression and fusion. There is no free fluid. The bladder appears normal. Previous hysterectomy. No acute bowel pathology is seen.  Review of the MIP images confirms the above findings.  IMPRESSION: No evidence of arterial pathology. No evidence of dissection or widespread atherosclerotic change. The patient does have a renal artery stent in the midportion of the right renal artery. The right kidney is slightly smaller than the left and there are a few areas of cortical atrophy.  No distinct cause of the presenting symptoms is identified.   Electronically Signed   By: Nelson Chimes M.D.   On: 04/12/2014 03:12   Ct Angio Chest Aorta W/cm &/or Wo/cm  04/12/2014   CLINICAL DATA:  Left-sided chest pain radiating to the left arm and back. Nausea and vomiting.  EXAM: CT ANGIOGRAPHY CHEST AND ABDOMEN  TECHNIQUE: Multidetector CT imaging of the chest and abdomen was performed using the standard protocol during bolus administration of intravenous contrast. Multiplanar CT image reconstructions and MIPs were obtained to evaluate the vascular anatomy.  CONTRAST:  100 cc Omnipaque 350  COMPARISON:  Chest radiography same day  FINDINGS: CTA CHEST FINDINGS  Precontrast scanning is negative. There is no aortic pathology. No evidence of atherosclerosis or  dissection. The pulmonary arterial tree appears normal. The branching pattern of the brachiocephalic vessels from the arch is normal. No coronary artery calcification is seen. There is no pleural or pericardial fluid. No hilar or mediastinal mass. The lungs are clear bilaterally. No chest wall abnormality is seen.  Review of the MIP images  confirms the above findings.  CTA ABDOMEN FINDINGS  The abdominal aorta and its branch vessels appear entirely normal, with the exception that the patient has a renal artery stent on the right. There is no calcified atherosclerosis seen. Both iliac systems appear widely patent.  There is fatty change of the liver. No focal liver lesion. No calcified gallstones. The spleen is normal. The pancreas is normal. The adrenal glands are normal. The kidneys are normal except for mild focal atrophy in the midportion of the right kidney. The right kidney is smaller than the left kidney is well. The IVC is normal. No retroperitoneal adenopathy. There has been previous lumbar diskectomy, decompression and fusion. There is no free fluid. The bladder appears normal. Previous hysterectomy. No acute bowel pathology is seen.  Review of the MIP images confirms the above findings.  IMPRESSION: No evidence of arterial pathology. No evidence of dissection or widespread atherosclerotic change. The patient does have a renal artery stent in the midportion of the right renal artery. The right kidney is slightly smaller than the left and there are a few areas of cortical atrophy.  No distinct cause of the presenting symptoms is identified.   Electronically Signed   By: Nelson Chimes M.D.   On: 04/12/2014 03:12          Assessment & Plan:  1. Chest discomfort:  She has had CP for many years - thought to be due to microvascular angina or perhaps coronary vasospasm.  She has had negative workups in the past . Her last myoview was in 2013 but was suboptimal.  Troponin levels are negative.  I  think it would be reasonable to do a [employer] tomorrow am ( she has already eaten breakfast today)   In the past, she has had significant pain related to stress  This pain may also be related to stress   2. [place] - she has hx of fibromuscular dysplasia and has had renal artery stenting.  BP seem fairly well controlled.  Thayer Headings, Brooke Bonito., MD, [facility] 04/12/2014, 9:27 AM [address]  [place] Pager [phone]

## 2014-04-12 NOTE — ED Notes (Signed)
Pt vomiting and requesting nitro past to be removed

## 2014-04-12 NOTE — Progress Notes (Signed)
UR completed 

## 2014-04-12 NOTE — ED Notes (Signed)
Pt given lunch, states she wants to try to eat despite nausea

## 2014-04-12 NOTE — Progress Notes (Addendum)
Family Medicine paged r/t nausea unrelieved by zofran and emesis x1. Awaiting call back.

## 2014-04-12 NOTE — Plan of Care (Signed)
Problem: Consults Goal: Chest Pain Patient Education (See Patient Education module for education specifics.) Outcome: Progressing  Problem: Phase I Progression Outcomes Goal: Hemodynamically stable Outcome: Progressing Goal: Voiding-avoid urinary catheter unless indicated Outcome: Completed/Met Date Met:  04/12/14

## 2014-04-12 NOTE — ED Notes (Signed)
Heparin stopped at 0754

## 2014-04-12 NOTE — Progress Notes (Addendum)
Paged family medicine twice after initial page. Dr. Gerlean Ren called back at 1700 and MD to enter orders for IV Ativan.

## 2014-04-13 DIAGNOSIS — R072 Precordial pain: Secondary | ICD-10-CM

## 2014-04-13 MED ORDER — BUSPIRONE HCL 5 MG PO TABS
7.5000 mg | ORAL_TABLET | Freq: Two times a day (BID) | ORAL | Status: DC
  Administered 2014-04-13 – 2014-04-16 (×6): 7.5 mg via ORAL
  Filled 2014-04-13 (×4): qty 1.5
  Filled 2014-04-13: qty 2
  Filled 2014-04-13 (×2): qty 1.5

## 2014-04-13 MED ORDER — EXERCISE FOR HEART AND HEALTH BOOK
Freq: Once | Status: DC
  Filled 2014-04-13: qty 1

## 2014-04-13 MED ORDER — ACTIVE PARTNERSHIP FOR HEALTH OF YOUR HEART BOOK
Freq: Once | Status: DC
  Filled 2014-04-13: qty 1

## 2014-04-13 NOTE — Progress Notes (Signed)
UR completed 

## 2014-04-13 NOTE — Progress Notes (Signed)
Nutrition Brief Note  Patient identified on the Malnutrition Screening Tool (MST) Report  Wt Readings from Last 15 Encounters:  04/13/14 140 lb 10.5 oz (63.8 kg)  02/06/14 135 lb (61.236 kg)  12/03/13 135 lb (61.236 kg)  11/16/13 136 lb (61.689 kg)  08/17/13 134 lb (60.782 kg)  08/14/13 131 lb (59.421 kg)  08/10/13 137 lb (62.143 kg)  08/07/13 134 lb (60.782 kg)  07/17/13 136 lb (61.689 kg)  06/26/13 136 lb (61.689 kg)  06/19/13 132 lb (59.875 kg)  05/23/13 134 lb 2 oz (60.839 kg)  05/07/13 134 lb (60.782 kg)  04/06/13 138 lb (62.596 kg)  04/03/13 137 lb (62.143 kg)    Body mass index is 24.13 kg/(m^2). Patient meets criteria for normal body weight based on current BMI.   Current diet order is NPO. Pt denies the need for nutritional supplements at this time. Labs and medications reviewed.   No nutrition interventions warranted at this time. If nutrition issues arise, please consult RD.   Laurette Schimke RD, LDN

## 2014-04-13 NOTE — Progress Notes (Signed)
Family Medicine Teaching Service Daily Progress Note Intern Pager: [phone]  Patient name: [name] Rowe Medical record number: [id number] Date of birth: April 02, 1961 Age: 53 y.o. Gender: female  Primary Care Provider: Vance Gather, MD Consultants: Cardiology Code Status: Full  Pt Overview and Major Events to Date:  11/6 - admitted, ACS ruled out  Assessment and Plan:  [name] Rowe is a 53 y.o. female presenting with left sided chest pain, diaphoresis, dizziness, SOB, and tingling of the hands bilaterally . PMH is significant for fibromuscular dysplasia, HTN, and possible coronary vasospasm vs mircorvascular angina, depression, and anxiety.   Chest pain - vasospasm versus anxiety, has history of vasospasm during a cath in [place] in 2009 - troponin negative 3, EKG unchanged and nonischemic - allergy consult, appreciate recommendations  - recommend Myoview, however was not scheduled for this morning. Stating her notes that they're okay with DC in follow-up in the clinic.  - However after discussion with the patient she would like very much to stay for the [place]. - Myoview for tomorrow morning, NPO after midnight  #HTN: BPs elevated in ED this AM. Most likely related to fibromuscular dysplasia of the renal arteries. Urinary metanephrines and plasma renin/aldosterone ratio normal in the past. - continue nifedipine 90mg  - continue labetalol 800mg  daily  - continue spironolactone 50mg    #Fibromuscular dysplasia: S/p right renal artery stent placement in 2009 - Stable, HTN management as above - Creatinine 1.30  #Psych - Continue home Seroquel 100mg  qHS,  - Continue Zoloft 50mg    #Hyperlipidemia  - Continue Crestor 40mg   #Hypothyroidism  - Continue Synthroid 24mcg  #Arthralgia of hip: most likely 2/2 nerve impingement - Continue gabapentin 600mg  in AM, 1200mg  qHS and flexeril 10mg  TID PRN  # IBS: stable - Continue Linzess  FEN/GI: NS 75cc/hr, heart healthy  diet  Prophylaxis: Heparin Protonix 40mg  QD   Disposition: monitor overnight and myoview in the am.   Subjective:  Still having intermittent chest pain, nausea continued but helped by the meds.   Objective: Temp:  [97.4 F (36.3 C)-98.6 F (37 C)] 97.9 F (36.6 C) (11/07 0845) Pulse Rate:  [63-91] 76 (11/07 0845) Resp:  [11-24] 17 (11/07 0845) BP: (91-150)/(52-102) 106/62 mmHg (11/07 0845) SpO2:  [95 %-100 %] 97 % (11/07 0845) Weight:  [140 lb 10.5 oz (63.8 kg)] 140 lb 10.5 oz (63.8 kg) (11/07 0400) Physical Exam: Gen: NAD, alert, cooperative with exam HEENT: NCAT CV: RRR, good S1/S2, no murmur Resp: CTABL, no wheezes, non-labored Abd: SNTND, BS present, no guarding or organomegaly Ext: No edema, warm Neuro: Alert and oriented, No gross deficits   Laboratory:  Recent Labs Lab 04/11/14 2300 04/12/14 0828  WBC 8.9 7.6  HGB 14.4 13.8  HCT 40.3 40.4  PLT 318 294    Recent Labs Lab 04/11/14 2300 04/12/14 0828  NA 140  --   K 3.9  --   CL 102  --   CO2 25  --   BUN 17  --   CREATININE 1.34* 1.13*  CALCIUM 9.4  --   GLUCOSE 103*  --       Recent Labs Lab 04/11/14 2300 04/12/14 0417 04/12/14 0828  TROPONINI <0.30 <0.30 <0.30      Imaging/Diagnostic Tests: CT angio chest and abdomen 04/12/2014 IMPRESSION: No evidence of arterial pathology. No evidence of dissection or widespread atherosclerotic change. The patient does have a renal artery stent in the midportion of the right renal artery. The right kidney is slightly smaller than the left and  there are a few areas of cortical atrophy.  No distinct cause of the presenting symptoms is identified.  Timmothy Euler, MD 04/13/2014, 11:55 AM PGY-3, [place] Intern pager: [phone], text pages welcome

## 2014-04-13 NOTE — Plan of Care (Signed)
Problem: Phase II Progression Outcomes Goal: Hemodynamically stable Outcome: Completed/Met Date Met:  04/13/14 Goal: Anginal pain relieved Outcome: Completed/Met Date Met:  04/13/14 Goal: CV Risk Factors identified Outcome: Completed/Met Date Met:  04/13/14 Goal: If positive for MI, change to MI Path Outcome: Not Applicable Date Met:  [birth year]

## 2014-04-13 NOTE — Progress Notes (Signed)
Patient ID: [name] Rowe, female   DOB: 12-Aug-1960, 53 y.o.   MRN: [id number]    Subjective:  Denies SSCP, palpitations or Dyspnea Nausea improved   See note Dr Acie Fredrickson yesterday  Myovue but not clear to me that it was ordered Tried to call nuclear at [phone] no answer  She has not been down stairs yet   Objective:  Filed Vitals:   04/13/14 0600 04/13/14 0700 04/13/14 0752 04/13/14 0845  BP:   110/68 106/62  Pulse: 67 64 80 76  Temp:   97.8 F (36.6 C) 97.9 F (36.6 C)  TempSrc:   Oral Oral  Resp: 12 14 15 17   Height:      Weight:      SpO2: 98% 95% 97% 97%    Intake/Output from previous day:  Intake/Output Summary (Last 24 hours) at 04/13/14 7026 Last data filed at 04/12/14 2007  Gross per 24 hour  Intake    120 ml  Output    300 ml  Net   -180 ml    Physical Exam: Affect appropriate Healthy:  appears stated age HEENT: normal Neck supple with no adenopathy JVP normal no bruits no thyromegaly Lungs clear with no wheezing and good diaphragmatic motion Heart:  S1/S2 no murmur, no rub, gallop or click PMI normal Abdomen: benighn, BS positve, no tenderness, no AAA no bruit.  No HSM or HJR Distal pulses intact with no bruits No edema Neuro non-focal Skin warm and dry No muscular weakness   Lab Results: Basic Metabolic Panel:  Recent Labs  04/11/14 2300 04/12/14 0828  NA 140  --   K 3.9  --   CL 102  --   CO2 25  --   GLUCOSE 103*  --   BUN 17  --   CREATININE 1.34* 1.13*  CALCIUM 9.4  --    Liver Function Tests: No results for input(s): AST, ALT, ALKPHOS, BILITOT, PROT, ALBUMIN in the last 72 hours. No results for input(s): LIPASE, AMYLASE in the last 72 hours. CBC:  Recent Labs  04/11/14 2300 04/12/14 0828  WBC 8.9 7.6  HGB 14.4 13.8  HCT 40.3 40.4  MCV 86.3 89.4  PLT 318 294   Cardiac Enzymes:  Recent Labs  04/11/14 2300 04/12/14 0417 04/12/14 0828  TROPONINI <0.30 <0.30 <0.30   BNP: Invalid input(s): POCBNP D-Dimer:  Recent  Labs  04/12/14 0120  DDIMER 0.36   Hemoglobin A1C:  Recent Labs  04/12/14 0417  HGBA1C 5.2    Imaging: Dg Chest 2 View  04/11/2014   CLINICAL DATA:  Left-sided chest pain beginning today. Some shortness of breath. History of hypertension.  EXAM: CHEST  2 VIEW  COMPARISON:  01/13/2013  FINDINGS: Heart size is normal. Mediastinal shadows are normal. The lungs are clear. No bronchial thickening. No infiltrate, mass, effusion or collapse. Pulmonary vascularity is normal. No bony abnormality.  IMPRESSION: Normal chest   Electronically Signed   By: Nelson Chimes M.D.   On: 04/11/2014 23:29   Ct Angio Abdomen W/cm &/or Wo Contrast  04/12/2014   CLINICAL DATA:  Left-sided chest pain radiating to the left arm and back. Nausea and vomiting.  EXAM: CT ANGIOGRAPHY CHEST AND ABDOMEN  TECHNIQUE: Multidetector CT imaging of the chest and abdomen was performed using the standard protocol during bolus administration of intravenous contrast. Multiplanar CT image reconstructions and MIPs were obtained to evaluate the vascular anatomy.  CONTRAST:  100 cc Omnipaque 350  COMPARISON:  Chest radiography same day  FINDINGS:  CTA CHEST FINDINGS  Precontrast scanning is negative. There is no aortic pathology. No evidence of atherosclerosis or dissection. The pulmonary arterial tree appears normal. The branching pattern of the brachiocephalic vessels from the arch is normal. No coronary artery calcification is seen. There is no pleural or pericardial fluid. No hilar or mediastinal mass. The lungs are clear bilaterally. No chest wall abnormality is seen.  Review of the MIP images confirms the above findings.  CTA ABDOMEN FINDINGS  The abdominal aorta and its branch vessels appear entirely normal, with the exception that the patient has a renal artery stent on the right. There is no calcified atherosclerosis seen. Both iliac systems appear widely patent.  There is fatty change of the liver. No focal liver lesion. No calcified  gallstones. The spleen is normal. The pancreas is normal. The adrenal glands are normal. The kidneys are normal except for mild focal atrophy in the midportion of the right kidney. The right kidney is smaller than the left kidney is well. The IVC is normal. No retroperitoneal adenopathy. There has been previous lumbar diskectomy, decompression and fusion. There is no free fluid. The bladder appears normal. Previous hysterectomy. No acute bowel pathology is seen.  Review of the MIP images confirms the above findings.  IMPRESSION: No evidence of arterial pathology. No evidence of dissection or widespread atherosclerotic change. The patient does have a renal artery stent in the midportion of the right renal artery. The right kidney is slightly smaller than the left and there are a few areas of cortical atrophy.  No distinct cause of the presenting symptoms is identified.   Electronically Signed   By: Nelson Chimes M.D.   On: 04/12/2014 03:12   Ct Angio Chest Aorta W/cm &/or Wo/cm  04/12/2014   CLINICAL DATA:  Left-sided chest pain radiating to the left arm and back. Nausea and vomiting.  EXAM: CT ANGIOGRAPHY CHEST AND ABDOMEN  TECHNIQUE: Multidetector CT imaging of the chest and abdomen was performed using the standard protocol during bolus administration of intravenous contrast. Multiplanar CT image reconstructions and MIPs were obtained to evaluate the vascular anatomy.  CONTRAST:  100 cc Omnipaque 350  COMPARISON:  Chest radiography same day  FINDINGS: CTA CHEST FINDINGS  Precontrast scanning is negative. There is no aortic pathology. No evidence of atherosclerosis or dissection. The pulmonary arterial tree appears normal. The branching pattern of the brachiocephalic vessels from the arch is normal. No coronary artery calcification is seen. There is no pleural or pericardial fluid. No hilar or mediastinal mass. The lungs are clear bilaterally. No chest wall abnormality is seen.  Review of the MIP images confirms  the above findings.  CTA ABDOMEN FINDINGS  The abdominal aorta and its branch vessels appear entirely normal, with the exception that the patient has a renal artery stent on the right. There is no calcified atherosclerosis seen. Both iliac systems appear widely patent.  There is fatty change of the liver. No focal liver lesion. No calcified gallstones. The spleen is normal. The pancreas is normal. The adrenal glands are normal. The kidneys are normal except for mild focal atrophy in the midportion of the right kidney. The right kidney is smaller than the left kidney is well. The IVC is normal. No retroperitoneal adenopathy. There has been previous lumbar diskectomy, decompression and fusion. There is no free fluid. The bladder appears normal. Previous hysterectomy. No acute bowel pathology is seen.  Review of the MIP images confirms the above findings.  IMPRESSION: No evidence of arterial  pathology. No evidence of dissection or widespread atherosclerotic change. The patient does have a renal artery stent in the midportion of the right renal artery. The right kidney is slightly smaller than the left and there are a few areas of cortical atrophy.  No distinct cause of the presenting symptoms is identified.   Electronically Signed   By: Nelson Chimes M.D.   On: 04/12/2014 03:12    Cardiac Studies:  ECG:  NSR normal    Telemetry:  NSR no arrhythmia   Echo:   Medications:   . aspirin EC  81 mg Oral Daily  . busPIRone  5 mg Oral BID  . gabapentin  600 mg Oral Daily  . gabapentin  1,200 mg Oral QHS  . heparin subcutaneous  5,000 Units Subcutaneous 3 times per day  . Influenza vac split quadrivalent PF  0.5 mL Intramuscular Tomorrow-1000  . levothyroxine  200 mcg Oral QAC breakfast  . Linaclotide  290 mcg Oral Daily  . NIFEdipine  90 mg Oral Daily  . nitroGLYCERIN  1 inch Topical 4 times per day  . pantoprazole  40 mg Oral Daily  . QUEtiapine  100 mg Oral QHS  . regadenoson  0.4 mg Intravenous Once  .  rosuvastatin  40 mg Oral Daily  . sertraline  50 mg Oral Daily  . spironolactone  50 mg Oral Daily     . sodium chloride 75 mL/hr at 04/12/14 2008    Assessment/Plan:  Chest Pain: atypical r/o stress at home  If myovue cannot be down today ok to d/c home with outpatient f/u [facility] Thyroid:  Continue replacement  TSH normal 8 months aga Chol:  Continue statin   Jenkins Rouge 04/13/2014, 9:38 AM

## 2014-04-14 ENCOUNTER — Observation Stay (HOSPITAL_COMMUNITY): Payer: Medicaid Other

## 2014-04-14 LAB — GLUCOSE, CAPILLARY: Glucose-Capillary: 102 mg/dL — ABNORMAL HIGH (ref 70–99)

## 2014-04-14 MED ORDER — TECHNETIUM TC 99M SESTAMIBI GENERIC - CARDIOLITE
30.0000 | Freq: Once | INTRAVENOUS | Status: AC | PRN
  Administered 2014-04-14: 30 via INTRAVENOUS

## 2014-04-14 MED ORDER — LABETALOL HCL 200 MG PO TABS
200.0000 mg | ORAL_TABLET | Freq: Two times a day (BID) | ORAL | Status: DC
  Administered 2014-04-14 – 2014-04-16 (×4): 200 mg via ORAL
  Filled 2014-04-14 (×5): qty 1

## 2014-04-14 MED ORDER — LORAZEPAM 2 MG/ML IJ SOLN
1.0000 mg | Freq: Once | INTRAMUSCULAR | Status: AC
  Administered 2014-04-14: 1 mg via INTRAVENOUS
  Filled 2014-04-14: qty 1

## 2014-04-14 MED ORDER — TECHNETIUM TC 99M SESTAMIBI GENERIC - CARDIOLITE
10.0000 | Freq: Once | INTRAVENOUS | Status: AC | PRN
  Administered 2014-04-14: 10 via INTRAVENOUS

## 2014-04-14 MED ORDER — REGADENOSON 0.4 MG/5ML IV SOLN
INTRAVENOUS | Status: AC
  Administered 2014-04-14: 0.4 mg
  Filled 2014-04-14: qty 5

## 2014-04-14 NOTE — Progress Notes (Signed)
Utilization Review Completed.   Decarla Siemen, RN, BSN Nurse Case Manager  

## 2014-04-14 NOTE — Progress Notes (Signed)
Pt BP 77/43 with pulse 70.  Pt awakened from sleep.  Pt denies any complaints. NS infusing at 75 ml/hr.  Dr. Wendi Snipes notified with no new orders at present.  Nursing to notify MD if symptomatic or decrease in BP.

## 2014-04-14 NOTE — Progress Notes (Signed)
Family Medicine Teaching Service Daily Progress Note Intern Pager: [phone]  Patient name: [name] Rowe Medical record number: [id number] Date of birth: [birth year] Age: 53 y.o. Gender: female  Primary Care Provider: Vance Gather, MD Consultants: Cardiology Code Status: Full  Pt Overview and Major Events to Date:  11/6 - admitted, ACS ruled out  Assessment and Plan: [name] Rowe is a 53 y.o. female presenting with left sided chest pain, diaphoresis, dizziness, SOB, and tingling of the hands bilaterally . PMH is significant for fibromuscular dysplasia, HTN, and possible coronary vasospasm vs mircorvascular angina, depression, and anxiety.   #Chest pain: Vasospasm versus anxiety, has history of vasospasm during a cath in [place] in 2009 -cards consult  -myoview today  -treat underlying stress   #HTN: BPs elevated in ED this AM. Most likely related to fibromuscular dysplasia of the renal arteries. Urinary metanephrines and plasma renin/aldosterone ratio normal in the past. - continue nifedipine 90mg  - continue spironolactone 50mg   - questionable re-start labetalol following BB  #Fibromuscular dysplasia: S/p right renal artery stent placement in 2009 - Stable, HTN management as above - Creatinine 1.30  #Psych - Continue home Seroquel 100mg  qHS,  - Continue Zoloft 50mg    #Hyperlipidemia  - Continue Crestor 40mg   #Hypothyroidism  - Continue Synthroid 282mcg  #Arthralgia of hip: most likely 2/2 nerve impingement - Continue gabapentin 600mg  in AM, 1200mg  qHS and flexeril 10mg  TID PRN  # IBS: stable - Continue Linzess  FEN/GI: heart healthy diet  Prophylaxis: Heparin Protonix 40mg  QD  Disposition: pending results of cards study this morning and appropriate f/up   Subjective: going for lexi this morning   Objective: Temp:  [98.2 F (36.8 C)-98.9 F (37.2 C)] 98.3 F (36.8 C) (11/08 0400) Pulse Rate:  [70-115] 106 (11/08 0956) Resp:  [10-30] 20 (11/08  0956) BP: (80-137)/(44-95) 124/89 mmHg (11/08 0956) SpO2:  [93 %-100 %] 99 % (11/08 0606) Physical Exam: Pt at lexi this morning Will re-examine once returns from test  Laboratory:  Recent Labs Lab 04/11/14 2300 04/12/14 0828  WBC 8.9 7.6  HGB 14.4 13.8  HCT 40.3 40.4  PLT 318 294    Recent Labs Lab 04/11/14 2300 04/12/14 0828  NA 140  --   K 3.9  --   CL 102  --   CO2 25  --   BUN 17  --   CREATININE 1.34* 1.13*  CALCIUM 9.4  --   GLUCOSE 103*  --        Recent Labs Lab 04/11/14 2300 04/12/14 0417 04/12/14 0828  TROPONINI <0.30 <0.30 <0.30      Imaging/Diagnostic Tests: CT angio chest and abdomen 04/12/2014 IMPRESSION: No evidence of arterial pathology. No evidence of dissection or widespread atherosclerotic change. The patient does have a renal artery stent in the midportion of the right renal artery. The right kidney is slightly smaller than the left and there are a few areas of cortical atrophy.  No distinct cause of the presenting symptoms is identified.  Bernadene Bell, MD 04/14/2014, 10:01 AM PGY-2, [place] Intern pager: [phone], text pages welcome

## 2014-04-14 NOTE — Progress Notes (Signed)
Lexiscan CL performed. Results pending.

## 2014-04-14 NOTE — Plan of Care (Signed)
Problem: Phase II Progression Outcomes Goal: Stress Test if indicated Outcome: Completed/Met Date Met:  04/14/14  Problem: Phase III Progression Outcomes Goal: Hemodynamically stable Outcome: Completed/Met Date Met:  04/14/14 Goal: No anginal pain Outcome: Completed/Met Date Met:  04/14/14 Goal: Tolerating diet Outcome: Completed/Met Date Met:  04/14/14

## 2014-04-14 NOTE — Progress Notes (Deleted)
Patient ID: [name] Rowe, female   DOB: 09-12-1960, 53 y.o.   MRN: [id number]    Subjective:  Denies SSCP, palpitations or Dyspnea Nausea improved   See note Dr Acie Fredrickson yesterday  Myovue today  Objective:  Filed Vitals:   04/14/14 0406 04/14/14 0521 04/14/14 0522 04/14/14 0606  BP: 95/61 93/61 93/61  93/60  Pulse:    72  Temp:      TempSrc:      Resp: 15 12 16 14   Height:      Weight:      SpO2:    99%    Intake/Output from previous day:  Intake/Output Summary (Last 24 hours) at 04/14/14 0908 Last data filed at 04/14/14 0500  Gross per 24 hour  Intake    260 ml  Output    350 ml  Net    -90 ml    Physical Exam: Affect appropriate Healthy:  appears stated age HEENT: normal Neck supple with no adenopathy JVP normal no bruits no thyromegaly Lungs clear with no wheezing and good diaphragmatic motion Heart:  S1/S2 no murmur, no rub, gallop or click PMI normal Abdomen: benighn, BS positve, no tenderness, no AAA no bruit.  No HSM or HJR Distal pulses intact with no bruits No edema Neuro non-focal Skin warm and dry No muscular weakness   Lab Results: Basic Metabolic Panel:  Recent Labs  04/11/14 2300 04/12/14 0828  NA 140  --   K 3.9  --   CL 102  --   CO2 25  --   GLUCOSE 103*  --   BUN 17  --   CREATININE 1.34* 1.13*  CALCIUM 9.4  --    CBC:  Recent Labs  04/11/14 2300 04/12/14 0828  WBC 8.9 7.6  HGB 14.4 13.8  HCT 40.3 40.4  MCV 86.3 89.4  PLT 318 294   Cardiac Enzymes:  Recent Labs  04/11/14 2300 04/12/14 0417 04/12/14 0828  TROPONINI <0.30 <0.30 <0.30   BNP: Invalid input(s): POCBNP D-Dimer:  Recent Labs  04/12/14 0120  DDIMER 0.36   Hemoglobin A1C:  Recent Labs  04/12/14 0417  HGBA1C 5.2    Imaging: No results found.  Cardiac Studies:  ECG:  NSR normal    Telemetry:  NSR no arrhythmia   Echo:   Medications:   . active partnership for health of your heart book   Does not apply Once  . aspirin EC  81 mg Oral  Daily  . busPIRone  7.5 mg Oral BID  . excerise for heart and health book   Does not apply Once  . gabapentin  600 mg Oral Daily  . gabapentin  1,200 mg Oral QHS  . heparin subcutaneous  5,000 Units Subcutaneous 3 times per day  . levothyroxine  200 mcg Oral QAC breakfast  . Linaclotide  290 mcg Oral Daily  . NIFEdipine  90 mg Oral Daily  . nitroGLYCERIN  1 inch Topical 4 times per day  . pantoprazole  40 mg Oral Daily  . QUEtiapine  100 mg Oral QHS  . regadenoson  0.4 mg Intravenous Once  . rosuvastatin  40 mg Oral Daily  . sertraline  50 mg Oral Daily  . spironolactone  50 mg Oral Daily     . sodium chloride 75 mL/hr at 04/12/14 2008    Assessment/Plan:  Chest Pain: atypical r/o stress at home  Radiology to read myovue today if normal d/c home f/u Dr Aundra Dubin  Thyroid:  Continue replacement  TSH  normal 8 months aga Chol:  Continue statin   Jenkins Rouge 04/14/2014, 9:08 AM

## 2014-04-14 NOTE — Progress Notes (Signed)
PT RETURNED FROM STRESS TEST. CALLED OUT SAYING SHE WAS SEEING DOUBLE AND HAD A H/A, REQUESTED ATIVAN. CBG CHECKED 102. AM MEDS AND ATIVAN GIVEN SNACK GIVEN JUICE GIVEN WILL CONTINUE TO MONITOR AT THIS TIME

## 2014-04-14 NOTE — Progress Notes (Signed)
Patient ID: [name] Rowe, female   DOB: 30-Jun-1960, 53 y.o.   MRN: [id number]    Subjective:  Denies SSCP, palpitations or Dyspnea Nausea improved   See note Dr Acie Fredrickson yesterday  Myovue today  Objective:  Filed Vitals:   04/14/14 0406 04/14/14 0521 04/14/14 0522 04/14/14 0606  BP: 95/61 93/61 93/61  93/60  Pulse:    72  Temp:      TempSrc:      Resp: 15 12 16 14   Height:      Weight:      SpO2:    99%    Intake/Output from previous day:  Intake/Output Summary (Last 24 hours) at 04/14/14 0906 Last data filed at 04/14/14 0500  Gross per 24 hour  Intake    260 ml  Output    350 ml  Net    -90 ml    Physical Exam: Affect appropriate Healthy:  appears stated age HEENT: normal Neck supple with no adenopathy JVP normal no bruits no thyromegaly Lungs clear with no wheezing and good diaphragmatic motion Heart:  S1/S2 no murmur, no rub, gallop or click PMI normal Abdomen: benighn, BS positve, no tenderness, no AAA no bruit.  No HSM or HJR Distal pulses intact with no bruits No edema Neuro non-focal Skin warm and dry No muscular weakness   Lab Results: Basic Metabolic Panel:  Recent Labs  04/11/14 2300 04/12/14 0828  NA 140  --   K 3.9  --   CL 102  --   CO2 25  --   GLUCOSE 103*  --   BUN 17  --   CREATININE 1.34* 1.13*  CALCIUM 9.4  --    Liver Function Tests: No results for input(s): AST, ALT, ALKPHOS, BILITOT, PROT, ALBUMIN in the last 72 hours. No results for input(s): LIPASE, AMYLASE in the last 72 hours. CBC:  Recent Labs  04/11/14 2300 04/12/14 0828  WBC 8.9 7.6  HGB 14.4 13.8  HCT 40.3 40.4  MCV 86.3 89.4  PLT 318 294   Cardiac Enzymes:  Recent Labs  04/11/14 2300 04/12/14 0417 04/12/14 0828  TROPONINI <0.30 <0.30 <0.30   BNP: Invalid input(s): POCBNP D-Dimer:  Recent Labs  04/12/14 0120  DDIMER 0.36   Hemoglobin A1C:  Recent Labs  04/12/14 0417  HGBA1C 5.2    Imaging: No results found.  Cardiac Studies:  ECG:   NSR normal    Telemetry:  NSR no arrhythmia   Echo:   Medications:   . active partnership for health of your heart book   Does not apply Once  . aspirin EC  81 mg Oral Daily  . busPIRone  7.5 mg Oral BID  . excerise for heart and health book   Does not apply Once  . gabapentin  600 mg Oral Daily  . gabapentin  1,200 mg Oral QHS  . heparin subcutaneous  5,000 Units Subcutaneous 3 times per day  . levothyroxine  200 mcg Oral QAC breakfast  . Linaclotide  290 mcg Oral Daily  . NIFEdipine  90 mg Oral Daily  . nitroGLYCERIN  1 inch Topical 4 times per day  . pantoprazole  40 mg Oral Daily  . QUEtiapine  100 mg Oral QHS  . regadenoson  0.4 mg Intravenous Once  . rosuvastatin  40 mg Oral Daily  . sertraline  50 mg Oral Daily  . spironolactone  50 mg Oral Daily     . sodium chloride 75 mL/hr at 04/12/14 2008  Assessment/Plan:  Chest Pain: atypical r/o stress at home  D/C home if myovue normal f/u Dr Aundra Dubin   Thyroid:  Continue replacement  TSH normal 8 months aga Chol:  Continue statin   Jenkins Rouge 04/14/2014, 9:06 AM

## 2014-04-14 NOTE — Progress Notes (Signed)
PT GOOD APPETITE, REPORTS H/A GONE BUT CONT TO HAVE BLURRED VISION, Jerrye Bushy, MD NOTIFIED

## 2014-04-15 ENCOUNTER — Observation Stay (HOSPITAL_COMMUNITY): Payer: Medicaid Other

## 2014-04-15 ENCOUNTER — Ambulatory Visit: Payer: Medicaid Other | Admitting: Cardiology

## 2014-04-15 DIAGNOSIS — H532 Diplopia: Secondary | ICD-10-CM

## 2014-04-15 LAB — TSH: TSH: 36.45 u[IU]/mL — ABNORMAL HIGH (ref 0.350–4.500)

## 2014-04-15 MED ORDER — GADOBENATE DIMEGLUMINE 529 MG/ML IV SOLN
13.0000 mL | Freq: Once | INTRAVENOUS | Status: AC | PRN
  Administered 2014-04-15: 13 mL via INTRAVENOUS

## 2014-04-15 NOTE — Consult Note (Signed)
NEURO HOSPITALIST CONSULT NOTE    Reason for Consult: ? MS  HPI:                                                                                                                                          [name] Rowe is an 53 y.o. female initially brought to hospital due to increased anxiety and associated increased CP. patient was evaluated by cardiology with a negative Myoview.  Patient was evaluated by psych and buspar was added to her anxiety medication regime. Yesterday waiting for a test, she states she had a sudden onset of horizontal diplopia.. She has had a similar episode about 7-10 years ago in the past while in [place]. The diplopia lasted a few weeks and resolved spontaneously.  She also endorses left hand paresthesias and left foot pain which were transient 5 days ago.  While in [place] she states she had a work up for MS " but cannot recall exactly what was done. She believes they did a MRI and lab tests and recalls them being were negative for MS per patient." She states she has had multiple episodes of diplopia over the years.  All have lasted days to weeks and resolved spontaneously.  She also reports that she has short spells of blurry vision as well.  About six months ago she was experiencing numbness and weakness on the left.  She had a MRI of the brain at that time.  NO contrast was used.   There was a question of demyelinating disease versus small vessel ischemic changes at that time.  She has also had a MRI of the lumbar spine that shows no demyelinating lesions.    Currently she continues to have horizontal diplopia.      Past Medical History  Diagnosis Date  . Migraine headache   . Chest pain     a. No angiographic CAD by cath 2009 in [place]. There was catheter-induced RCA vasospasm versus microvascular obstruction. bCarlton Adam myoview 04/2010 - EF 79%, normal perfusion.;  c. ETT-Myoview 7/13: no ischemia, EF 77%, submax exercise  . Renal artery stenosis      a. Due to fibromuscular dysplasia. b. Renal stent 2009. c. Studies:  CTA abdomen (1/13) with evidence for fibromuscular dysplasia, right renal artery stent appears patent. Renal artery dopplers (2/13) with FMD but no evidence for signficant stenosis.   . Hypothyroidism 2003    Graves disease s/p radio-iodine treatment in 2003   . Fibromuscular dysplasia 2009    a. Renal artery stenosis due to this. b. Carotid dopplers 07/2011 c/w FMD but no significant stenosis  . Tachycardia 2000    10/27/10-Holter -NSR with occ sinus tachy-rare PACs, no sig arrhythmia  . Hx of echocardiogram     a. Echo (1/13): EF  63-89%, grade 1 diastolic dysfunction  . HTN (hypertension) 1988    a. Likely related to RAS; urinary metanephrines and plasma renin/aldosterone ratio normal. b. H/o HTN urgency 06/2011 after being off BP meds for a number of days  . HLD (hyperlipidemia) 2000  . Myocardial infarction 2009  . Anxiety 2001  . Depression 2001  . Hypertensive crisis 06/29/2011  . Leiomyoma   . Adenomyosis   . Fibromyalgia 1999  . Coronary vasospasm   . Chronic back pain     had 3 epidural injections on 02-14-13  . Hearing loss of left ear     s/p CVA 2006  . Personal history of colonic polyps-sessile serrated adenoma 02/23/2013  . CVA (cerebral vascular accident) 2006    lost hearing on left  . Irritable bowel syndrome   . Complication of anesthesia     has difficulty voiding after surgery  . H/O Graves' disease 2003    s/p radio-iodine treatment in 2003     Past Surgical History  Procedure Laterality Date  . Renal artery stent  2009  . Spine surgery    . Back surgery  2010 and 2011    In CT.  11/19/2008 (possibly a laminectomy) and in May of 2011 she underwent a L2-L3 fusion.  . Robotic assisted lap vaginal hysterectomy  05/13/2008    [place] hysterectomy  for myomatous uterus   . Colonoscopy    . Colonoscopy    . Cardiac catheterization  2009    states it was normal  . Vaginal hysterectomy     . Knee arthroscopy Left   . Hip surgery Bilateral 1988 and 1989    "scraped head of femur"  . Lumbar laminectomy/decompression microdiscectomy Left 05/25/2013    Procedure: LUMBAR LAMINECTOMY/DECOMPRESSION MICRODISCECTOMY 1 LEVEL;  Surgeon: Winfield Cunas, MD;  Location: [facility];  Service: Neurosurgery;  Laterality: Left;  LEFT L5S1 microdiskectomy    Family History  Problem Relation Age of Onset  . Heart attack    . Stroke    . Hypertension Mother   . Asthma Mother   . Hyperlipidemia Mother   . Stroke Mother   . Hypertension Father   . Prostate cancer Father   . Heart attack Father   . Hyperlipidemia Father   . Stroke Father   . Hypertension Brother   . Heart attack Brother 63  . Early death Brother 42    massive stroke   . Hyperlipidemia Brother   . Metabolic syndrome Sister   . Depression Sister   . Hypertension Sister   . Diabetes Maternal Aunt   . Colon cancer Neg Hx   . Stomach cancer Neg Hx     Social History:  reports that she has never smoked. She has never used smokeless tobacco. She reports that she drinks alcohol. She reports that she does not use illicit drugs.  Allergies  Allergen Reactions  . Hydromorphone Hcl Other (See Comments)    Palpitations  . Prochlorperazine Edisylate Other (See Comments)    Causes Seizures   . Sumatriptan Palpitations  . Atorvastatin Other (See Comments)    Joint pain  . Tramadol Anxiety    MEDICATIONS:  Scheduled: . active partnership for health of your heart book   Does not apply Once  . aspirin EC  81 mg Oral Daily  . busPIRone  7.5 mg Oral BID  . excerise for heart and health book   Does not apply Once  . gabapentin  600 mg Oral Daily  . gabapentin  1,200 mg Oral QHS  . heparin subcutaneous  5,000 Units Subcutaneous 3 times per day  . labetalol  200 mg Oral BID  . levothyroxine  200 mcg Oral QAC  breakfast  . Linaclotide  290 mcg Oral Daily  . NIFEdipine  90 mg Oral Daily  . nitroGLYCERIN  1 inch Topical 4 times per day  . pantoprazole  40 mg Oral Daily  . QUEtiapine  100 mg Oral QHS  . regadenoson  0.4 mg Intravenous Once  . rosuvastatin  40 mg Oral Daily  . sertraline  50 mg Oral Daily  . spironolactone  50 mg Oral Daily     ROS:                                                                                                                                       History obtained from the patient  General ROS: negative for - chills, fatigue, fever, night sweats, weight gain or weight loss Psychological ROS: negative for - behavioral disorder, hallucinations, memory difficulties, mood swings or suicidal ideation Ophthalmic ROS: negative for - blurry vision, double vision, eye pain or loss of vision ENT ROS: negative for - epistaxis, nasal discharge, oral lesions, sore throat, tinnitus or vertigo Allergy and Immunology ROS: negative for - hives or itchy/watery eyes Hematological and Lymphatic ROS: negative for - bleeding problems, bruising or swollen lymph nodes Endocrine ROS: negative for - galactorrhea, hair pattern changes, polydipsia/polyuria or temperature intolerance Respiratory ROS: negative for - cough, hemoptysis, shortness of breath or wheezing Cardiovascular ROS: negative for - chest pain, dyspnea on exertion, edema or irregular heartbeat Gastrointestinal ROS: negative for - abdominal pain, diarrhea, hematemesis, nausea/vomiting or stool incontinence Genito-Urinary ROS: negative for - dysuria, hematuria, incontinence or urinary frequency/urgency Musculoskeletal ROS: negative for - joint swelling or muscular weakness Neurological ROS: as noted in HPI Dermatological ROS: negative for rash and skin lesion changes   Blood pressure 111/74, pulse 84, temperature 98.1 F (36.7 C), temperature source Oral, resp. rate 23, height 5\' 4"  (1.626 m), weight 63.8 kg (140 lb 10.5  oz), SpO2 97 %.   Neurologic Examination:  General: NAD Mental Status: Alert, oriented, thought content appropriate.  Speech fluent without evidence of aphasia.  Able to follow 3 step commands without difficulty. Cranial Nerves: II: Discs flat bilaterally; Visual fields grossly normal, pupils equal, round, reactive to light and accommodation III,IV, VI: ptosis not present, extra-ocular motions intact bilaterally V,VII: smile symmetric, facial light touch sensation normal bilaterally VIII: hearing normal bilaterally IX,X: gag reflex present XI: bilateral shoulder shrug XII: midline tongue extension without atrophy or fasciculations  Motor: Right : Upper extremity   5/5    Left:     Upper extremity   5/5  Lower extremity   5/5     Lower extremity   5/5 Tone and bulk:normal tone throughout; no atrophy noted Sensory: Pinprick and light touch intact throughout, bilaterally Deep Tendon Reflexes:  3+ through out with 2 beats clonus at ankles.   Plantars: Right: downgoing   Left: downgoing Cerebellar: normal finger-to-nose,  normal heel-to-shin test Gait: steady CV: pulses palpable throughout    Lab Results: Basic Metabolic Panel:  Recent Labs Lab 04/11/14 2300 04/12/14 0828  NA 140  --   K 3.9  --   CL 102  --   CO2 25  --   GLUCOSE 103*  --   BUN 17  --   CREATININE 1.34* 1.13*  CALCIUM 9.4  --     Liver Function Tests: No results for input(s): AST, ALT, ALKPHOS, BILITOT, PROT, ALBUMIN in the last 168 hours. No results for input(s): LIPASE, AMYLASE in the last 168 hours. No results for input(s): AMMONIA in the last 168 hours.  CBC:  Recent Labs Lab 04/11/14 2300 04/12/14 0828  WBC 8.9 7.6  HGB 14.4 13.8  HCT 40.3 40.4  MCV 86.3 89.4  PLT 318 294    Cardiac Enzymes:  Recent Labs Lab 04/11/14 2300 04/12/14 0417 04/12/14 0828  TROPONINI <0.30 <0.30 <0.30     Lipid Panel: No results for input(s): CHOL, TRIG, HDL, CHOLHDL, VLDL, LDLCALC in the last 168 hours.  CBG:  Recent Labs Lab 04/14/14 1155  GLUCAP 102*    Microbiology: Results for orders placed or performed during the hospital encounter of 05/23/13  Surgical pcr screen     Status: None   Collection Time: 05/23/13  1:32 PM  Result Value Ref Range Status   MRSA, PCR NEGATIVE NEGATIVE Final   Staphylococcus aureus NEGATIVE NEGATIVE Final    Comment:        The Xpert SA Assay (FDA approved for NASAL specimens in patients over 72 years of age), is one component of a comprehensive surveillance program.  Test performance has been validated by [employer] for patients greater than or equal to 50 year old. It is not intended to diagnose infection nor to guide or monitor treatment.    Coagulation Studies: No results for input(s): LABPROT, INR in the last 72 hours.  Imaging: Nm Myocar Multi W/spect W/wall Motion / Ef  04/14/2014   CLINICAL DATA:  Chest pain, tachycardia, hyperlipidemia, hypertension, fibromuscular dysplasia, coronary vasospasm, CVA  EXAM: MYOCARDIAL IMAGING WITH SPECT (REST AND EXERCISE)  GATED LEFT VENTRICULAR WALL MOTION STUDY  LEFT VENTRICULAR EJECTION FRACTION  TECHNIQUE: Standard myocardial SPECT imaging was performed after resting intravenous injection of 10 mCi Tc-42m sestamibi. Subsequently, exercise tolerance test was performed by the patient under the supervision of the Cardiology staff. At peak-stress, 30 mCi Tc-83m sestamibi was injected intravenously and standard myocardial SPECT imaging was performed. Quantitative gated imaging was also performed to evaluate left ventricular wall  motion, and estimate left ventricular ejection fraction.  COMPARISON:  Chest radiograph -04/11/2014; chest CT- 04/12/2014  FINDINGS: Were all Ing: No significant patient motion artifact or chest wall attenuation.  Perfusion: There is homogeneous distribution of injected  radiotracer without matched or mismatched defect to suggest prior infarction or pharmacologically induced ischemia.  Wall Motion: Normal left ventricular wall motion. No left ventricular dilation.  Left Ventricular Ejection Fraction: 79 %  End diastolic volume 46 ml  End systolic volume 10 ml  IMPRESSION: 1. No scintigraphic evidence of prior infarction or pharmacologically induced ischemia.  2. Normal left ventricular wall motion.  3. Left ventricular ejection fraction 79%  4. Low-risk stress test findings*.  *2012 Appropriate Use Criteria for Coronary Revascularization Focused Update: J Am Coll Cardiol. 2633;35(4):562-563. http://content.[email or web address]?articleid=[email or web address]   Electronically Signed   By: Sandi Mariscal M.D.   On: 04/14/2014 13:01    Etta Quill PA-C [facility] [phone]  04/15/2014, 4:58 PM    Patient seen and examined.  Clinical course and management discussed.  Necessary edits performed.  I agree with the above.  Assessment and plan of care developed and discussed below.    Assessment/Plan: 53 year old female with acute onset diplopia.  Patient describes multiple episodes of neurological complaints in the past with spontaneous resolution.  She has returned to baseline after each event.  Currently no abnormalities noted on neurological examination.  Further work up recommended.    Recommendations: 1.  MRI of the brain with and without contrast.  Would compare with previous imaging to determine if there is an increase in T2 lesions as well as lok for evidence of enhancement.   2.  TSH  Alexis Goodell, MD [facility] [phone]  04/15/2014  6:03 PM

## 2014-04-15 NOTE — Progress Notes (Signed)
Patient ID: [name] Rowe, female   DOB: 31-Aug-1960, 53 y.o.   MRN: [id number]    Subjective:  Denies SSCP, palpitations or Dyspnea Blurry vision also seems to have subsided  Objective:  Filed Vitals:   04/14/14 2327 04/15/14 0051 04/15/14 0525 04/15/14 0832  BP: 103/56 93/56 91/56    Pulse: 81 71    Temp:  98.4 F (36.9 C) 97.8 F (36.6 C) 98.1 F (36.7 C)  TempSrc:  Oral Oral Oral  Resp: 13 14 24    Height:      Weight:      SpO2: 100% 100% 99%     Intake/Output from previous day: No intake or output data in the 24 hours ending 04/15/14 0859  Physical Exam: Affect appropriate Healthy:  appears stated age HEENT: normal Neck supple with no adenopathy JVP normal no bruits no thyromegaly Lungs clear with no wheezing and good diaphragmatic motion Heart:  S1/S2 no murmur, no rub, gallop or click PMI normal Abdomen: benighn, BS positve, no tenderness, no AAA no bruit.  No HSM or HJR Distal pulses intact with no bruits No edema Neuro non-focal Skin warm and dry No muscular weakness    Imaging: Nm Myocar Multi W/spect W/wall Motion / Ef  04/14/2014   CLINICAL DATA:  Chest pain, tachycardia, hyperlipidemia, hypertension, fibromuscular dysplasia, coronary vasospasm, CVA  EXAM: MYOCARDIAL IMAGING WITH SPECT (REST AND EXERCISE)  GATED LEFT VENTRICULAR WALL MOTION STUDY  LEFT VENTRICULAR EJECTION FRACTION  TECHNIQUE: Standard myocardial SPECT imaging was performed after resting intravenous injection of 10 mCi Tc-13m sestamibi. Subsequently, exercise tolerance test was performed by the patient under the supervision of the Cardiology staff. At peak-stress, 30 mCi Tc-32m sestamibi was injected intravenously and standard myocardial SPECT imaging was performed. Quantitative gated imaging was also performed to evaluate left ventricular wall motion, and estimate left ventricular ejection fraction.  COMPARISON:  Chest radiograph -04/11/2014; chest CT- 04/12/2014  FINDINGS: Were all Ing: No  significant patient motion artifact or chest wall attenuation.  Perfusion: There is homogeneous distribution of injected radiotracer without matched or mismatched defect to suggest prior infarction or pharmacologically induced ischemia.  Wall Motion: Normal left ventricular wall motion. No left ventricular dilation.  Left Ventricular Ejection Fraction: 79 %  End diastolic volume 46 ml  End systolic volume 10 ml  IMPRESSION: 1. No scintigraphic evidence of prior infarction or pharmacologically induced ischemia.  2. Normal left ventricular wall motion.  3. Left ventricular ejection fraction 79%  4. Low-risk stress test findings*.  *2012 Appropriate Use Criteria for Coronary Revascularization Focused Update: J Am Coll Cardiol. 5621;30(8):657-846. http://content.[email or web address]?articleid=[email or web address]   Electronically Signed   By: Sandi Mariscal M.D.   On: 04/14/2014 13:01    Cardiac Studies:  ECG:  NSR normal    Telemetry:  NSR no arrhythmia   Echo:   Medications:   . active partnership for health of your heart book   Does not apply Once  . aspirin EC  81 mg Oral Daily  . busPIRone  7.5 mg Oral BID  . excerise for heart and health book   Does not apply Once  . gabapentin  600 mg Oral Daily  . gabapentin  1,200 mg Oral QHS  . heparin subcutaneous  5,000 Units Subcutaneous 3 times per day  . labetalol  200 mg Oral BID  . levothyroxine  200 mcg Oral QAC breakfast  . Linaclotide  290 mcg Oral Daily  . NIFEdipine  90 mg Oral Daily  . nitroGLYCERIN  1 inch Topical 4 times  per day  . pantoprazole  40 mg Oral Daily  . QUEtiapine  100 mg Oral QHS  . regadenoson  0.4 mg Intravenous Once  . rosuvastatin  40 mg Oral Daily  . sertraline  50 mg Oral Daily  . spironolactone  50 mg Oral Daily        Assessment/Plan:  Chest Pain: atypical r/o reviewed myovue Normal No ischemia or infarct normal EF   f/u Dr Aundra Dubin  Outpatient ok to d/c home  Thyroid:  Continue replacement  TSH normal 8 months  aga Chol:  Continue statin   Jenkins Rouge 04/15/2014, 8:59 AM

## 2014-04-15 NOTE — Progress Notes (Signed)
Family Medicine Teaching Service Daily Progress Note Intern Pager: [phone]  Patient name: [name] Rowe Medical record number: [id number] Date of birth: 1961/02/14 Age: 53 y.o. Gender: female  Primary Care Provider: Vance Gather, MD Consultants: Cardiology Code Status: Full  Pt Overview and Major Events to Date:  11/6 - admitted, ACS ruled out 11/8: normal Lexiscan; patient develops double vision   Assessment and Plan: [name] Rowe is a 53 y.o. female presenting with left sided chest pain, diaphoresis, dizziness, SOB, and tingling of the hands bilaterally . PMH is significant for fibromuscular dysplasia, HTN, and possible coronary vasospasm vs mircorvascular angina, depression, and anxiety.    #Chest pain: Vasospasm versus anxiety, has history of vasospasm during a cath in [place] in 2009 -cards consult  -myoview without ischemia/infarct with normal EF - Pt should f/u with Dr. Aundra Dubin as an outpatient -treat underlying stress   #HTN: BPs elevated in ED this AM. Most likely related to fibromuscular dysplasia of the renal arteries. Urinary metanephrines and plasma renin/aldosterone ratio normal in the past. - continue nifedipine 90mg  - continue spironolactone 50mg   - continue labetalol 200mg  daily  #Diplopia: Patient states that she has had this before (approximately 10 years ago). It came on suddenly and resolved spontaneously. She states they worked her up for MS (with blood work she believes) but it was negative. She had a MRI in 08/2013 which revealed mild periventricular and subcortical white matter changes are advanced for age. The finding is nonspecific but can be seen in the setting of chronic microvascular ischemia, a demyelinating process such as multiple sclerosis, vasculitis, complicated migraine headaches, or as the sequelae of a prior infectious or inflammatory process. Patient noted to have 3-5 beats of nystagmus, hyperreflexia, and clonus on exam. - MRI of the brain  with and without contrast. - Consult neurology; appreciate recs   #Fibromuscular dysplasia: S/p right renal artery stent placement in 2009 - Stable, HTN management as above - Creatinine 1.13  #Psych - Continue home Seroquel 100mg  qHS,  - Continue Zoloft 50mg   - Added BusPar 7.5mg  BID  #Hyperlipidemia  - Continue Crestor 40mg   #Hypothyroidism  - Continue Synthroid 228mcg  #Arthralgia of hip: most likely 2/2 nerve impingement - Continue gabapentin 600mg  in AM, 1200mg  qHS and flexeril 10mg  TID PRN  # IBS: stable - Continue Linzess  FEN/GI: heart healthy diet  Prophylaxis: Heparin Protonix 40mg  QD  Disposition: pending results of MRI this afternoon; most likely tomorrow.  Subjective: Patient continues to endorse blurred vision. She had a headache but it has since improved. Her chest pain is much improved. No SOB.  Objective: Temp:  [97.8 F (36.6 C)-98.9 F (37.2 C)] 98.1 F (36.7 C) (11/09 1105) Pulse Rate:  [71-95] 84 (11/09 1105) Resp:  [13-24] 23 (11/09 1105) BP: (91-125)/(56-79) 111/74 mmHg (11/09 1105) SpO2:  [95 %-100 %] 97 % (11/09 1105) Physical Exam: General: Lying in bed in NAD. Appears stated age. HEENT: Atraumatic, normocephalic. PERRL. EOMI (per attending, horizontal nystagmus) MMM, oropharynx clear. No nasal dischrge  Cardiovascular: RRR, no m/r/g noted. 2+DP and radial pulses bilaterally. No JVD Respiratory: CTAB without wheezing, crackles, or rhonchi noted Abdomen: +BS, soft, non-tender, non-distend. Extremities: No edema, gross deformities Skin: No rashes noted Neuro: A&Ox4. Speech clear. Symmetric clonus and hyperreflexia.   Laboratory:  Recent Labs Lab 04/11/14 2300 04/12/14 0828  WBC 8.9 7.6  HGB 14.4 13.8  HCT 40.3 40.4  PLT 318 294    Recent Labs Lab 04/11/14 2300 04/12/14 0828  NA 140  --  K 3.9  --   CL 102  --   CO2 25  --   BUN 17  --   CREATININE 1.34* 1.13*  CALCIUM 9.4  --   GLUCOSE 103*  --        Recent  Labs Lab 04/11/14 2300 04/12/14 0417 04/12/14 0828  TROPONINI <0.30 <0.30 <0.30    Imaging/Diagnostic Tests: CT angio chest and abdomen 04/12/2014 IMPRESSION: No evidence of arterial pathology. No evidence of dissection or widespread atherosclerotic change. The patient does have a renal artery stent in the midportion of the right renal artery. The right kidney is slightly smaller than the left and there are a few areas of cortical atrophy.  No distinct cause of the presenting symptoms is identified.]   Nm Myocar Multi W/spect W/wall Motion / Ef  04/14/2014 CLINICAL DATA: Chest pain, tachycardia, hyperlipidemia, hypertension, fibromuscular dysplasia, coronary vasospasm, CVA EXAM: MYOCARDIAL IMAGING WITH SPECT (REST AND EXERCISE) GATED LEFT VENTRICULAR WALL MOTION STUDY LEFT VENTRICULAR EJECTION FRACTION TECHNIQUE: Standard myocardial SPECT imaging was performed after resting intravenous injection of 10 mCi Tc-45m sestamibi. Subsequently, exercise tolerance test was performed by the patient under the supervision of the Cardiology staff. At peak-stress, 30 mCi Tc-16m sestamibi was injected intravenously and standard myocardial SPECT imaging was performed. Quantitative gated imaging was also performed to evaluate left ventricular wall motion, and estimate left ventricular ejection fraction. COMPARISON: Chest radiograph -04/11/2014; chest CT- 04/12/2014 FINDINGS: Were all Ing: No significant patient motion artifact or chest wall attenuation. Perfusion: There is homogeneous distribution of injected radiotracer without matched or mismatched defect to suggest prior infarction or pharmacologically induced ischemia. Wall Motion: Normal left ventricular wall motion. No left ventricular dilation. Left Ventricular Ejection Fraction: 79 % End diastolic volume 46 ml End systolic volume 10 ml IMPRESSION: 1. No scintigraphic evidence of prior infarction or pharmacologically induced ischemia. 2.  Normal left ventricular wall motion. 3. Left ventricular ejection fraction 79% 4. Low-risk stress test findings*. *2012 Appropriate Use Criteria for Coronary Revascularization Focused Update: J Am Coll Cardiol. 3817;71(1):657-903. http://[email or web address]?articleid=[email or web address] Electronically Signed By: Sandi Mariscal M.D. On: 04/14/2014 13:01   Archie Patten, MD 04/15/2014, 2:43 PM PGY-1, [place] Intern pager: [phone], text pages welcome

## 2014-04-15 NOTE — Progress Notes (Signed)
UR completed 

## 2014-04-16 MED ORDER — BUSPIRONE HCL 7.5 MG PO TABS: 7.5000 mg | ORAL_TABLET | Freq: Two times a day (BID) | ORAL | Status: DC

## 2014-04-16 MED ORDER — ATORVASTATIN CALCIUM 40 MG PO TABS: 40.0000 mg | ORAL_TABLET | Freq: Every day | ORAL | Status: DC

## 2014-04-16 MED ORDER — ROSUVASTATIN CALCIUM 10 MG PO TABS: 40.0000 mg | ORAL_TABLET | Freq: Every day | ORAL | Status: DC

## 2014-04-16 NOTE — Progress Notes (Signed)
Family Medicine Teaching Service Daily Progress Note Intern Pager: [phone]  Patient name: [name] Rowe Medical record number: [id number] Date of birth: 09/29/1960 Age: 53 y.o. Gender: female  Primary Care Provider: Vance Gather, MD Consultants: Cardiology Code Status: Full  Pt Overview and Major Events to Date:  11/6 - admitted, ACS ruled out 11/8: normal Lexiscan; patient develops double vision  11/9: Repeat MRI stable   Assessment and Plan: [name] Rowe is a 53 y.o. female presenting with left sided chest pain, diaphoresis, dizziness, SOB, and tingling of the hands bilaterally . PMH is significant for fibromuscular dysplasia, HTN, and possible coronary vasospasm vs mircorvascular angina, depression, and anxiety.    #Chest pain: Vasospasm versus anxiety, has history of vasospasm during a cath in [place] in 2009 -cards consult  -myoview without ischemia/infarct with normal EF - Pt should f/u with Dr. Aundra Dubin as an outpatient -treat underlying stress   #HTN: BPs elevated in ED this AM. Most likely related to fibromuscular dysplasia of the renal arteries. Urinary metanephrines and plasma renin/aldosterone ratio normal in the past. - continue nifedipine 90mg  - continue spironolactone 50mg   - continue labetalol 200mg  daily  #Diplopia: Patient states that she has had this before (approximately 10 years ago). It came on suddenly and resolved spontaneously. She states they worked her up for MS (with blood work she believes) but it was negative. She had a MRI in 08/2013 which revealed mild periventricular and subcortical white matter changes are advanced for age. The finding is nonspecific but can be seen in the setting of chronic microvascular ischemia, a demyelinating process such as multiple sclerosis, vasculitis, complicated migraine headaches, or as the sequelae of a prior infectious or inflammatory process. Patient noted to have 3-5 beats of nystagmus, hyperreflexia, and clonus on  exam. - MRI of the brain with no enhancement with contrast and no brain stem lesions - Consult neurology; appreciate recs  - F/u ophthalmology as oupt - Pt should not drive until symptoms resolved   #Fibromuscular dysplasia: S/p right renal artery stent placement in 2009 - Stable, HTN management as above - Creatinine 1.13  #Psych - Continue home Seroquel 100mg  qHS,  - Continue Zoloft 50mg   - Added BusPar 7.5mg  BID during this hospitalization  #Hyperlipidemia  - Will transition from Crestor 40mg  to Lipitor 40mg --> increase to 80mg  if tolerated (pt with a h/o arthralgias in the past)  #Hypothyroidism: TSH elevated to 36.35 - Continue Synthroid 238mcg  #Arthralgia of hip: most likely 2/2 nerve impingement - Continue gabapentin 600mg  in AM, 1200mg  qHS and flexeril 10mg  TID PRN  # IBS: stable - Continue Linzess  FEN/GI: heart healthy diet  Prophylaxis: Heparin Protonix 40mg  QD  Disposition: pending results of MRI this afternoon; most likely tomorrow.  Subjective: Patient continues to endorse blurred vision. States she normally takes her synthroid at night with Seroquel and trazodone with no missed doses.    Objective: Temp:  [97.7 F (36.5 C)-98.2 F (36.8 C)] 98.2 F (36.8 C) (11/10 0753) Pulse Rate:  [74-85] 74 (11/10 0753) Resp:  [16-23] 18 (11/10 0753) BP: (93-111)/(51-74) 111/61 mmHg (11/10 0753) SpO2:  [97 %-100 %] 100 % (11/10 0753) Weight:  [138 lb 14.4 oz (63.005 kg)] 138 lb 14.4 oz (63.005 kg) (11/10 0450) Physical Exam: General: Lying in bed in NAD. Appears stated age. HEENT: Atraumatic, normocephalic. PERRL. EOMI (per attending, horizontal nystagmus) MMM, oropharynx clear. No nasal dischrge  Cardiovascular: RRR, no m/r/g noted. 2+DP and radial pulses bilaterally. No JVD Respiratory: CTAB without wheezing, crackles, or  rhonchi noted Abdomen: +BS, soft, non-tender, non-distend. Extremities: No edema, gross deformities Skin: No rashes noted Neuro: A&Ox4.  Speech clear. Symmetric clonus and hyperreflexia.   Laboratory:  Recent Labs Lab 04/11/14 2300 04/12/14 0828  WBC 8.9 7.6  HGB 14.4 13.8  HCT 40.3 40.4  PLT 318 294    Recent Labs Lab 04/11/14 2300 04/12/14 0828  NA 140  --   K 3.9  --   CL 102  --   CO2 25  --   BUN 17  --   CREATININE 1.34* 1.13*  CALCIUM 9.4  --   GLUCOSE 103*  --        Recent Labs Lab 04/11/14 2300 04/12/14 0417 04/12/14 0828  TROPONINI <0.30 <0.30 <0.30    Imaging/Diagnostic Tests: CT angio chest and abdomen 04/12/2014 IMPRESSION: No evidence of arterial pathology. No evidence of dissection or widespread atherosclerotic change. The patient does have a renal artery stent in the midportion of the right renal artery. The right kidney is slightly smaller than the left and there are a few areas of cortical atrophy.  No distinct cause of the presenting symptoms is identified.]   Nm Myocar Multi W/spect W/wall Motion / Ef  04/14/2014 CLINICAL DATA: Chest pain, tachycardia, hyperlipidemia, hypertension, fibromuscular dysplasia, coronary vasospasm, CVA EXAM: MYOCARDIAL IMAGING WITH SPECT (REST AND EXERCISE) GATED LEFT VENTRICULAR WALL MOTION STUDY LEFT VENTRICULAR EJECTION FRACTION TECHNIQUE: Standard myocardial SPECT imaging was performed after resting intravenous injection of 10 mCi Tc-73m sestamibi. Subsequently, exercise tolerance test was performed by the patient under the supervision of the Cardiology staff. At peak-stress, 30 mCi Tc-107m sestamibi was injected intravenously and standard myocardial SPECT imaging was performed. Quantitative gated imaging was also performed to evaluate left ventricular wall motion, and estimate left ventricular ejection fraction. COMPARISON: Chest radiograph -04/11/2014; chest CT- 04/12/2014 FINDINGS: Were all Ing: No significant patient motion artifact or chest wall attenuation. Perfusion: There is homogeneous distribution of injected radiotracer without  matched or mismatched defect to suggest prior infarction or pharmacologically induced ischemia. Wall Motion: Normal left ventricular wall motion. No left ventricular dilation. Left Ventricular Ejection Fraction: 79 % End diastolic volume 46 ml End systolic volume 10 ml IMPRESSION: 1. No scintigraphic evidence of prior infarction or pharmacologically induced ischemia. 2. Normal left ventricular wall motion. 3. Left ventricular ejection fraction 79% 4. Low-risk stress test findings*. *2012 Appropriate Use Criteria for Coronary Revascularization Focused Update: J Am Coll Cardiol. 9977;41(4):239-532. [email or web address] Electronically Signed By: Sandi Mariscal M.D. On: 04/14/2014 13:01   Archie Patten, MD 04/16/2014, 9:51 AM PGY-1, [facility] Intern pager: [phone], text pages welcome

## 2014-04-16 NOTE — Plan of Care (Signed)
Problem: Consults Goal: Chest Pain Patient Education (See Patient Education module for education specifics.)  Outcome: Completed/Met Date Met:  04/16/14 Goal: Skin Care Protocol Initiated - if Braden Score 18 or less If consults are not indicated, leave blank or document N/A  Outcome: Not Applicable Date Met:  53/31/74 Goal: Tobacco Cessation referral if indicated Outcome: Not Applicable Date Met:  09/92/78 Goal: Nutrition Consult-if indicated Outcome: Completed/Met Date Met:  04/16/14 Goal: Diabetes Guidelines if Diabetic/Glucose > 140 If diabetic or lab glucose is > 140 mg/dl - Initiate Diabetes/Hyperglycemia Guidelines & Document Interventions  Outcome: Not Applicable Date Met:  00/44/71  Problem: Phase III Progression Outcomes Goal: Cath/PCI Path as indicated Outcome: Not Applicable Date Met:  58/06/38 Goal: Vascular site scale level 0 - I Vascular Site Scale Level 0: No bruising/bleeding/hematoma Level I (Mild): Bruising/Ecchymosis, minimal bleeding/ooozing, palpable hematoma < 3 cm Level II (Moderate): Bleeding not affecting hemodynamic parameters, pseudoaneurysm, palpable hematoma > 3 cm Level III (Severe) Bleeding which affects hemodynamic parameters or retroperitoneal hemorrhage  Outcome: Not Applicable Date Met:  68/54/88 Goal: Discharge plan remains appropriate-arrangements made Outcome: Completed/Met Date Met:  04/16/14 Goal: If positive for MI, change to MI Path Outcome: Not Applicable Date Met:  30/14/15 Goal: Other Phase III Outcomes/Goals Outcome: Completed/Met Date Met:  04/16/14  Problem: Discharge Progression Outcomes Goal: No anginal pain Outcome: Completed/Met Date Met:  04/16/14 Goal: Hemodynamically stable Outcome: Completed/Met Date Met:  97/33/12 Goal: Complications resolved/controlled Outcome: Completed/Met Date Met:  04/16/14 Goal: Barriers To Progression Addressed/Resolved Outcome: Completed/Met Date Met:  04/16/14 Goal: Discharge plan in place and  appropriate Outcome: Completed/Met Date Met:  04/16/14 Goal: Vascular site scale level 0 - I Vascular Site Scale Level 0: No bruising/bleeding/hematoma Level I (Mild): Bruising/Ecchymosis, minimal bleeding/ooozing, palpable hematoma < 3 cm Level II (Moderate): Bleeding not affecting hemodynamic parameters, pseudoaneurysm, palpable hematoma > 3 cm Level III (Severe) Bleeding which affects hemodynamic parameters or retroperitoneal hemorrhage  Outcome: Not Applicable Date Met:  50/87/19 Goal: Tolerates diet Outcome: Completed/Met Date Met:  04/16/14 Goal: Activity appropriate for discharge plan Outcome: Completed/Met Date Met:  04/16/14 Goal: Other Discharge Outcomes/Goals Outcome: Completed/Met Date Met:  04/16/14

## 2014-04-16 NOTE — Discharge Instructions (Addendum)
Please do not drive until the double vision resolves Please take your thyroid medication (Synthroid) at lunch time without any other medications. You will need your thyroid level rechecked.  I have noticed, you have not been picking up your Synthroid every month- please take this every day because this could be contributing to your stress and double vision. Please attempt to refill your Crestor- if the insurance company will not pay for it, please have them contact Dr. Bonner Puna, your primary care physician. Please keep the follow up with your primary care doctor  Diplopia  Double vision (diplopia) means that you are seeing two of everything. Diplopia usually occurs with both eyes open, and may be worse when looking in one particular direction. If both eyes must be open to see double, this is called binocular diplopia. If double images are seen in just one eye, this is called monocular diplopia.  CAUSES  Binocular Diplopia  Disorder affecting the muscles that move the eyes or the nerves that control those muscles.  Tumor or other mass pushing on an eye from beside or behind the eye.  Myasthenia gravis. This is a neuromuscular illness that causes the body's muscles to tire easily. The eye muscles and eyelid muscles become weak. The eyes do not track together well.  Grave's disease. This is an overactivity of the thyroid gland. This condition causes swelling of tissues around the eyes. This produces a bulging out of the eyeball.  Blowout fracture of the bone around the eye. The muscles of the eye socket are damaged. This often happens when the eye is hit with force.  Complications after certain surgeries, such as a lens implant after cataract surgery.  Fluid-filled mass (abscess) behind or beside the eye, infection, or abnormal connection between arteries and veins. Sometimes, no cause is found. Monocular Diplopia  Problems with corrective lens or contacts.  Corneal abrasion, infection, severe  injury, or bulging and irregularity of the corneal surface (keratoconus).  Irregularities of the pupil from drugs, severe injury, or other causes.  Problems involving the lens of the eye, such as opacities or cataracts.  Complications after certain surgeries, such as a lens implant after cataract surgery.  Retinal detachment or problems involving the blood vessels of the retina. Sometimes, no cause is found. SYMPTOMS  Binocular Diplopia  When one eye is closed or covered, the double images disappear. Monocular Diplopia  When the unaffected eye is closed or covered, the double images remain. The double images disappear when the affected eye is closed or covered. DIAGNOSIS  A diagnosis is made during an eye exam. TREATMENT  Treatment depends on the cause or underlying disease.   Relief of double vision symptoms may be achieved by patching one eye or by using special glasses.  Surgery on the muscles of the eye may be needed. SEEK IMMEDIATE MEDICAL CARE IF:   You see two images of a single object you are looking at, either with both eyes open or with just one eye open. Document Released: 03/25/2004 Document Revised: 08/16/2011 Document Reviewed: 01/10/2008 [facility] Patient Information 2015 [place], [place]. This information is not intended to replace advice given to you by your health care provider. Make sure you discuss any questions you have with your health care provider.   Cardiac Diet This diet can help prevent heart disease and stroke. Many factors influence your heart health, including eating and exercise habits. Coronary risk rises a lot with abnormal blood fat (lipid) levels. Cardiac meal planning includes limiting unhealthy fats, increasing  healthy fats, and making other small dietary changes. General guidelines are as follows:  Adjust calorie intake to reach and maintain desirable body weight.  Limit total fat intake to less than 30% of total calories. Saturated fat should  be less than 7% of calories.  Saturated fats are found in animal products and in some vegetable products. Saturated vegetable fats are found in coconut oil, cocoa butter, palm oil, and palm kernel oil. Read labels carefully to avoid these products as much as possible. Use butter in moderation. Choose tub margarines and oils that have 2 grams of fat or less. Good cooking oils are canola and olive oils.  Practice low-fat cooking techniques. Do not fry food. Instead, broil, bake, boil, steam, grill, roast on a rack, stir-fry, or microwave it. Other fat reducing suggestions include:  Remove the skin from poultry.  Remove all visible fat from meats.  Skim the fat off stews, soups, and gravies before serving them.  Steam vegetables in water or broth instead of sauting them in fat.  Avoid foods with trans fat (or hydrogenated oils), such as commercially fried foods and commercially baked goods. Commercial shortening and deep-frying fats will contain trans fat.  Increase intake of fruits, vegetables, whole grains, and legumes to replace foods high in fat.  Increase consumption of nuts, legumes, and seeds to at least 4 servings weekly. One serving of a legume equals  cup, and 1 serving of nuts or seeds equals  cup.  Choose whole grains more often. Have 3 servings per day (a serving is 1 ounce [oz]).  Eat 4 to 5 servings of vegetables per day. A serving of vegetables is 1 cup of raw leafy vegetables;  cup of raw or cooked cut-up vegetables;  cup of vegetable juice.  Eat 4 to 5 servings of fruit per day. A serving of fruit is 1 medium whole fruit;  cup of dried fruit;  cup of fresh, frozen, or canned fruit;  cup of 100% fruit juice.  Increase your intake of dietary fiber to 20 to 30 grams per day. Insoluble fiber may help lower your risk of heart disease and may help curb your appetite.  Soluble fiber binds cholesterol to be removed from the blood. Foods high in soluble fiber are dried beans,  citrus fruits, oats, apples, bananas, broccoli, Brussels sprouts, and eggplant.  Try to include foods fortified with plant sterols or stanols, such as yogurt, breads, juices, or margarines. Choose several fortified foods to achieve a daily intake of 2 to 3 grams of plant sterols or stanols.  Foods with omega-3 fats can help reduce your risk of heart disease. Aim to have a 3.5 oz portion of fatty fish twice per week, such as salmon, mackerel, albacore tuna, sardines, lake trout, or herring. If you wish to take a fish oil supplement, choose one that contains 1 gram of both DHA and EPA.  Limit processed meats to 2 servings (3 oz portion) weekly.  Limit the sodium in your diet to 1500 milligrams (mg) per day. If you have high blood pressure, talk to a registered dietitian about a DASH (Dietary Approaches to Stop Hypertension) eating plan.  Limit sweets and beverages with added sugar, such as soda, to no more than 5 servings per week. One serving is:   1 tablespoon sugar.  1 tablespoon jelly or jam.   cup sorbet.  1 cup lemonade.   cup regular soda. CHOOSING FOODS Starches  Allowed: Breads: All kinds (wheat, rye, raisin, white, oatmeal, [place],  [place], and [place] muffin bread). Low-fat rolls: English muffins, frankfurter and hamburger buns, bagels, pita bread, tortillas (not fried). Pancakes, waffles, biscuits, and muffins made with recommended oil.  Avoid: Products made with saturated or trans fats, oils, or whole milk products. Butter rolls, cheese breads, croissants. Commercial doughnuts, muffins, sweet rolls, biscuits, waffles, pancakes, store-bought mixes. Crackers  Allowed: Low-fat crackers and snacks: Animal, graham, rye, saltine (with recommended oil, no lard), oyster, and matzo crackers. Bread sticks, melba toast, rusks, flatbread, pretzels, and light popcorn.  Avoid: High-fat crackers: cheese crackers, butter crackers, and those made with coconut, palm oil, or trans fat  (hydrogenated oils). Buttered popcorn. Cereals  Allowed: Hot or cold whole-grain cereals.  Avoid: Cereals containing coconut, hydrogenated vegetable fat, or animal fat. Potatoes / Pasta / Rice  Allowed: All kinds of potatoes, rice, and pasta (such as macaroni, spaghetti, and noodles).  Avoid: Pasta or rice prepared with cream sauce or high-fat cheese. Chow mein noodles, [place] fries. Vegetables  Allowed: All vegetables and vegetable juices.  Avoid: Fried vegetables. Vegetables in cream, butter, or high-fat cheese sauces. Limit coconut. Fruit in cream or custard. Protein  Allowed: Limit your intake of meat, seafood, and poultry to no more than 6 oz (cooked weight) per day. All lean, well-trimmed beef, veal, pork, and lamb. All chicken and [place] without skin. All fish and shellfish. Wild game: wild duck, rabbit, pheasant, and venison. Egg whites or low-cholesterol egg substitutes may be used as desired. Meatless dishes: recipes with dried beans, peas, lentils, and tofu (soybean curd). Seeds and nuts: all seeds and most nuts.  Avoid: Prime grade and other heavily marbled and fatty meats, such as short ribs, spare ribs, rib eye roast or steak, frankfurters, sausage, bacon, and high-fat luncheon meats, mutton. Caviar. Commercially fried fish. Domestic duck, goose, venison sausage. Organ meats: liver, gizzard, heart, chitterlings, brains, kidney, sweetbreads. Dairy  Allowed: Low-fat cheeses: nonfat or low-fat cottage cheese (1% or 2% fat), cheeses made with part skim milk, such as mozzarella, farmers, string, or ricotta. (Cheeses should be labeled no more than 2 to 6 grams fat per oz.). Skim (or 1%) milk: liquid, powdered, or evaporated. Buttermilk made with low-fat milk. Drinks made with skim or low-fat milk or cocoa. Chocolate milk or cocoa made with skim or low-fat (1%) milk. Nonfat or low-fat yogurt.  Avoid: Whole milk cheeses, including colby, cheddar, [place], Monterey Jack, [place], [place],  [place], American, Swiss, and blue. Creamed cottage cheese, cream cheese. Whole milk and whole milk products, including buttermilk or yogurt made from whole milk, drinks made from whole milk. Condensed milk, evaporated whole milk, and 2% milk. Soups and Combination Foods  Allowed: Low-fat low-sodium soups: broth, dehydrated soups, homemade broth, soups with the fat removed, homemade cream soups made with skim or low-fat milk. Low-fat spaghetti, lasagna, chili, and Spanish rice if low-fat ingredients and low-fat cooking techniques are used.  Avoid: Cream soups made with whole milk, cream, or high-fat cheese. All other soups. Desserts and Sweets  Allowed: Sherbet, fruit ices, gelatins, meringues, and angel food cake. Homemade desserts with recommended fats, oils, and milk products. Jam, jelly, honey, marmalade, sugars, and syrups. Pure sugar candy, such as gum drops, hard candy, jelly beans, marshmallows, mints, and small amounts of dark chocolate.  Avoid: Commercially prepared cakes, pies, cookies, frosting, pudding, or mixes for these products. Desserts containing whole milk products, chocolate, coconut, lard, palm oil, or palm kernel oil. Ice cream or ice cream drinks. Candy that contains chocolate, coconut, butter, hydrogenated fat, or unknown  ingredients. Buttered syrups. Fats and Oils  Allowed: Vegetable oils: safflower, sunflower, corn, soybean, cottonseed, sesame, canola, olive, or peanut. Non-hydrogenated margarines. Salad dressing or mayonnaise: homemade or commercial, made with a recommended oil. Low or nonfat salad dressing or mayonnaise.  Limit added fats and oils to 6 to 8 tsp per day (includes fats used in cooking, baking, salads, and spreads on bread). Remember to count the "hidden fats" in foods.  Avoid: Solid fats and shortenings: butter, lard, salt pork, bacon drippings. Gravy containing meat fat, shortening, or suet. Cocoa butter, coconut. Coconut oil, palm oil, palm kernel oil,  or hydrogenated oils: these ingredients are often used in bakery products, nondairy creamers, whipped toppings, candy, and commercially fried foods. Read labels carefully. Salad dressings made of unknown oils, sour cream, or cheese, such as blue cheese and Roquefort. Cream, all kinds: half-and-half, light, heavy, or whipping. Sour cream or cream cheese (even if "light" or low-fat). Nondairy cream substitutes: coffee creamers and sour cream substitutes made with palm, palm kernel, hydrogenated oils, or coconut oil. Beverages  Allowed: Coffee (regular or decaffeinated), tea. Diet carbonated beverages, mineral water. Alcohol: Check with your caregiver. Moderation is recommended.  Avoid: Whole milk, regular sodas, and juice drinks with added sugar. Condiments  Allowed: All seasonings and condiments. Cocoa powder. "Cream" sauces made with recommended ingredients.  Avoid: Carob powder made with hydrogenated fats. SAMPLE MENU Breakfast   cup orange juice   cup oatmeal  1 slice toast  1 tsp margarine  1 cup skim milk Lunch  [place] sandwich with 2 oz [place], 2 slices bread  Lettuce and tomato slices  Fresh fruit  Carrot sticks  Coffee or tea Snack  Fresh fruit or low-fat crackers Dinner  3 oz lean ground beef  1 baked potato  1 tsp margarine   cup asparagus  Lettuce salad  1 tbs non-creamy dressing   cup peach slices  1 cup skim milk Document Released: 03/02/2008 Document Revised: 11/23/2011 Document Reviewed: 07/24/2013 [employer] Patient Information 2015 [place], [place]. This information is not intended to replace advice given to you by your health care provider. Make sure you discuss any questions you have with your health care provider.   Angina Pectoris Angina pectoris is extreme discomfort in your chest, neck, or arm. Your doctor may call it just angina. It is caused by a lack of oxygen to your heart wall. It may feel like tightness or heavy pressure. It may  feel like a crushing or squeezing pain. Some people say it feels like gas. It may go down your shoulders, back, and arms. Some people have symptoms other than pain. These include:  Tiredness.  Shortness of breath.  Cold sweats.  Feeling sick to your stomach (nausea). There are four types of angina:  Stable angina. This type often lasts the same amount of time each time it happens. Activity, stress, or excitement can bring it on. It often gets better after taking a medicine called nitroglycerin. This goes under your tongue.  Unstable angina. This type can happen when you are not active or even during sleep. It can suddenly get worse or happen more often. It may not get better after taking the special medicine. It can last up to 30 minutes.  Microvascular angina. This type is more common in women. It may be more severe or last longer than other types.  Prinzmetal angina. This type often happens when you are not active or in the early morning hours. HOME CARE   Only take medicines  as told by your doctor.  Stay active or exercise more as told by your doctor.  Limit very hard activity as told by your doctor.  Limit heavy lifting as told by your doctor.  Keep a healthy weight.  Learn about and eat foods that are healthy for your heart.  Do not use any tobacco such as cigarettes, chewing tobacco, or e-cigarettes. GET HELP RIGHT AWAY IF:   You have chest, neck, deep shoulder, or arm pain or discomfort that lasts more than a few minutes.  You have chest, neck, deep shoulder, or arm pain or discomfort that goes away and comes back over and over again.  You have heavy sweating that seems to happen for no reason.  You have shortness of breath or trouble breathing.  Your angina does not get better after a few minutes of rest.  Your angina does not get better after you take nitroglycerin medicine. These can all be symptoms of a heart attack. Get help right away. Call your local  emergency service (911 in [place] Do not  drive yourself to the hospital. Do not  wait to for your symptoms to go away. MAKE SURE YOU:   Understand these instructions.  Will watch your condition.  Will get help right away if you are not doing well or get worse. Document Released: 11/10/2007 Document Revised: 05/29/2013 Document Reviewed: 09/25/2013 [facility] Patient Information 2015 [place], [place]. This information is not intended to replace advice given to you by your health care provider. Make sure you discuss any questions you have with your health care provider.

## 2014-04-16 NOTE — Progress Notes (Addendum)
Subjective: Patient reports that there has been no improvement in her vision.  There are no new complaints.    Objective: Current vital signs: BP 111/61 mmHg  Pulse 74  Temp(Src) 98.2 F (36.8 C) (Oral)  Resp 18  Ht 5\' 4"  (1.626 m)  Wt 63.005 kg (138 lb 14.4 oz)  BMI 23.83 kg/m2  SpO2 100% Vital signs in last 24 hours: Temp:  [97.7 F (36.5 C)-98.2 F (36.8 C)] 98.2 F (36.8 C) (11/10 0753) Pulse Rate:  [74-89] 74 (11/10 0753) Resp:  [16-23] 18 (11/10 0753) BP: (93-125)/(51-79) 111/61 mmHg (11/10 0753) SpO2:  [97 %-100 %] 100 % (11/10 0753) Weight:  [63.005 kg (138 lb 14.4 oz)] 63.005 kg (138 lb 14.4 oz) (11/10 0450)  Intake/Output from previous day: 11/09 0701 - 11/10 0700 In: 240 [P.O.:240] Out: 1050 [Urine:1050] Intake/Output this shift: Total I/O In: 120 [P.O.:120] Out: -  Nutritional status: Diet Heart  Neurologic Exam: Mental Status: Alert, oriented, thought content appropriate. Speech fluent without evidence of aphasia. Able to follow 3 step commands without difficulty. Cranial Nerves: II: Discs flat bilaterally; Visual fields grossly normal, pupils equal, round, reactive to light and accommodation III,IV, VI: ptosis not present, extra-ocular motions intact bilaterally V,VII: smile symmetric, facial light touch sensation normal bilaterally VIII: hearing normal bilaterally IX,X: gag reflex present XI: bilateral shoulder shrug XII: midline tongue extension without atrophy or fasciculations Motor: Right :Upper extremity 5/5Left: Upper extremity 5/5 Lower extremity 5/5Lower extremity 5/5 Tone and bulk:normal tone throughout; no atrophy noted Sensory: Pinprick and light touch intact throughout, bilaterally Deep Tendon Reflexes:  3+ throughout Plantars: Right: downgoingLeft: downgoing  Lab Results: Basic Metabolic  Panel:  Recent Labs Lab 04/11/14 2300 04/12/14 0828  NA 140  --   K 3.9  --   CL 102  --   CO2 25  --   GLUCOSE 103*  --   BUN 17  --   CREATININE 1.34* 1.13*  CALCIUM 9.4  --     Liver Function Tests: No results for input(s): AST, ALT, ALKPHOS, BILITOT, PROT, ALBUMIN in the last 168 hours. No results for input(s): LIPASE, AMYLASE in the last 168 hours. No results for input(s): AMMONIA in the last 168 hours.  CBC:  Recent Labs Lab 04/11/14 2300 04/12/14 0828  WBC 8.9 7.6  HGB 14.4 13.8  HCT 40.3 40.4  MCV 86.3 89.4  PLT 318 294    Cardiac Enzymes:  Recent Labs Lab 04/11/14 2300 04/12/14 0417 04/12/14 0828  TROPONINI <0.30 <0.30 <0.30    Lipid Panel: No results for input(s): CHOL, TRIG, HDL, CHOLHDL, VLDL, LDLCALC in the last 168 hours.  CBG:  Recent Labs Lab 04/14/14 1155  GLUCAP 102*    Microbiology: Results for orders placed or performed during the hospital encounter of 05/23/13  Surgical pcr screen     Status: None   Collection Time: 05/23/13  1:32 PM  Result Value Ref Range Status   MRSA, PCR NEGATIVE NEGATIVE Final   Staphylococcus aureus NEGATIVE NEGATIVE Final    Comment:        The Xpert SA Assay (FDA approved for NASAL specimens in patients over 59 years of age), is one component of a comprehensive surveillance program.  Test performance has been validated by [employer] for patients greater than or equal to 54 year old. It is not intended to diagnose infection nor to guide or monitor treatment.    Coagulation Studies: No results for input(s): LABPROT, INR in the last 72 hours.  Imaging:  Mr Jeri Cos Wo Contrast  04/15/2014   CLINICAL DATA:  Diplopia  EXAM: MRI HEAD WITHOUT AND WITH CONTRAST  TECHNIQUE: Multiplanar, multiecho pulse sequences of the brain and surrounding structures were obtained without and with intravenous contrast.  CONTRAST:  62mL MULTIHANCE GADOBENATE DIMEGLUMINE 529 MG/ML IV SOLN  COMPARISON:  MRI 08/09/2013   FINDINGS: Ventricle size is normal. Cerebral volume is normal. Craniocervical junction normal. Pituitary normal in size.  Negative for acute infarct.  Small white matter hyperintensities bilaterally unchanged from the prior study and consistent with chronic microvascular ischemia. Brainstem and cerebellum intact.  Chronic microhemorrhage right lateral thalamus and right putamen unchanged from the prior study.  Negative for mass or edema.  Normal enhancement following contrast administration.  Mild mucosal edema in the paranasal sinuses and mastoid sinus bilaterally.  IMPRESSION: Mild chronic microvascular ischemic change in the white matter. No acute infarct or mass. No change from the prior MRI 08/09/2013   Electronically Signed   By: Franchot Gallo M.D.   On: 04/15/2014 21:06   Nm Myocar Multi W/spect W/wall Motion / Ef  04/14/2014   CLINICAL DATA:  Chest pain, tachycardia, hyperlipidemia, hypertension, fibromuscular dysplasia, coronary vasospasm, CVA  EXAM: MYOCARDIAL IMAGING WITH SPECT (REST AND EXERCISE)  GATED LEFT VENTRICULAR WALL MOTION STUDY  LEFT VENTRICULAR EJECTION FRACTION  TECHNIQUE: Standard myocardial SPECT imaging was performed after resting intravenous injection of 10 mCi Tc-55m sestamibi. Subsequently, exercise tolerance test was performed by the patient under the supervision of the Cardiology staff. At peak-stress, 30 mCi Tc-52m sestamibi was injected intravenously and standard myocardial SPECT imaging was performed. Quantitative gated imaging was also performed to evaluate left ventricular wall motion, and estimate left ventricular ejection fraction.  COMPARISON:  Chest radiograph -04/11/2014; chest CT- 04/12/2014  FINDINGS: Were all Ing: No significant patient motion artifact or chest wall attenuation.  Perfusion: There is homogeneous distribution of injected radiotracer without matched or mismatched defect to suggest prior infarction or pharmacologically induced ischemia.  Wall Motion:  Normal left ventricular wall motion. No left ventricular dilation.  Left Ventricular Ejection Fraction: 79 %  End diastolic volume 46 ml  End systolic volume 10 ml  IMPRESSION: 1. No scintigraphic evidence of prior infarction or pharmacologically induced ischemia.  2. Normal left ventricular wall motion.  3. Left ventricular ejection fraction 79%  4. Low-risk stress test findings*.  *2012 Appropriate Use Criteria for Coronary Revascularization Focused Update: J Am Coll Cardiol. 4010;27(2):536-644. http://[email or web address]   Electronically Signed   By: Sandi Mariscal M.D.   On: 04/14/2014 13:01    Medications:  I have reviewed the patient's current medications. Scheduled: . active partnership for health of your heart book   Does not apply Once  . aspirin EC  81 mg Oral Daily  . busPIRone  7.5 mg Oral BID  . excerise for heart and health book   Does not apply Once  . gabapentin  600 mg Oral Daily  . gabapentin  1,200 mg Oral QHS  . heparin subcutaneous  5,000 Units Subcutaneous 3 times per day  . labetalol  200 mg Oral BID  . levothyroxine  200 mcg Oral QAC breakfast  . Linaclotide  290 mcg Oral Daily  . NIFEdipine  90 mg Oral Daily  . nitroGLYCERIN  1 inch Topical 4 times per day  . pantoprazole  40 mg Oral Daily  . QUEtiapine  100 mg Oral QHS  . regadenoson  0.4 mg Intravenous Once  . rosuvastatin  40 mg Oral Daily  . sertraline  50 mg Oral Daily  . spironolactone  50 mg Oral Daily    Assessment/Plan: Patient unchanged.  MRI of the brain reviewed with and without contrast.  Areas of concern appear to be secondary to small vessel disease.  There is no enhancement with contrast and no brain stem lesions.  Imaging unchanged from March.  Doubt MS.  TSH was checked though and is elevated at 36.45.  Was normal 8 months ago.  This may be the source of her visual complaints.    Recommendations: 1.  Agree with addressing thyroid abnormalities 2.  Would have  patient evaluated by Ophthalmology   LOS: 5 days   Alexis Goodell, MD [facility] [phone] 04/16/2014  9:00 AM

## 2014-04-22 ENCOUNTER — Ambulatory Visit (INDEPENDENT_AMBULATORY_CARE_PROVIDER_SITE_OTHER): Payer: Medicaid Other | Admitting: Family Medicine

## 2014-04-22 ENCOUNTER — Encounter: Payer: Self-pay | Admitting: Family Medicine

## 2014-04-22 VITALS — BP 158/101 | HR 92 | Temp 98.5°F | Ht 64.0 in | Wt 137.6 lb

## 2014-04-22 DIAGNOSIS — E785 Hyperlipidemia, unspecified: Secondary | ICD-10-CM

## 2014-04-22 DIAGNOSIS — Z8639 Personal history of other endocrine, nutritional and metabolic disease: Secondary | ICD-10-CM

## 2014-04-22 DIAGNOSIS — E039 Hypothyroidism, unspecified: Secondary | ICD-10-CM

## 2014-04-22 DIAGNOSIS — H532 Diplopia: Secondary | ICD-10-CM

## 2014-04-22 DIAGNOSIS — F419 Anxiety disorder, unspecified: Secondary | ICD-10-CM

## 2014-04-22 DIAGNOSIS — I10 Essential (primary) hypertension: Secondary | ICD-10-CM

## 2014-04-22 NOTE — Assessment & Plan Note (Signed)
Pt denies taking KDUR due to large size of the pills prescribed. Would certainly change dosage form, but labs have not show potassium disturbance despite her not taking this so will formally discontinue K supplement. Will continue spironolactone (no dose change) and monitor BMET in 4 weeks.

## 2014-04-22 NOTE — Progress Notes (Signed)
Patient ID: [name] Rowe, female   DOB: 03-06-1961, 53 y.o.   MRN: [id number]   Subjective:  [name] Rowe is a 53 y.o. female here for hospital follow up.  PMH: microvascular angina, fibromuscular dysplasia causing RAS and poorly constrolled HTN, depression, and anxiety disorder.  She was admitted for ACS work-up which included a negative lexiscan. Cardiology follow up was put into place for January.   She developed binocular diplopia with horizontal nystagmus, clonus bilaterally in the feet, and 3+ patellar reflexes. MRI revealed only mild chronic microvascular ischemic change in the white matter with no change from recent prior MRI. Neurohospitalist did not feel this was secondary to [place] and recommended an ophthalmology evaluation. She tells me that she has been evaluated in the past for the same symptoms (both blurry and double vision that comes and goes with some concordance with stressful situations) and told that there is nothing to be done. She thinks her vision might be blurry all the time. She only uses reading glasses.   Her TSH was found to be severely elevated to 36.45, likely due to medication non-adherence. She reports taking this during the evening 100% of the time since discharge. Denies changes in temperature tolerance, hair/skin texture, appetite. No palpitations.   She admits significant stress due to her husband (who is incarcerated in [place] for child sexual assault) not signing divorce papers, her son's recent hospitalization, and other chronic occupational stresses. She says she has plenty of social support from nearby friends and speaks frequently with a therapist as well as being under the care of a psychiatrist.   All other pertinent systems reviewed and are negative. Objective:  BP 158/101 mmHg  Pulse 92  Temp(Src) 98.5 F (36.9 C) (Oral)  Ht 5\' 4"  (1.626 m)  Wt 137 lb 9.6 oz (62.415 kg)  BMI 23.61 kg/m2  Gen: Very anxious 53 y.o. female in no  distress CV: RRR, no murmur, no JVD Resp: Non-labored, CTAB MSK: No edema noted Neuro: Alert and oriented, speech normal, gait normal, visual fields full to confrontation.  Psych: Neatly groomed and appropriately dressed. Speech is normal volume and rate. Mood is "stressed and very anxious" with a  full affect. No suicidal or homicidal ideation. Assessment & Plan:  [name] Rowe is a 53 y.o. female here for hospital follow up.

## 2014-04-22 NOTE — Assessment & Plan Note (Signed)
To follow up with psychotherapist and psychiatrist. Severe anxiety disorder with multiple significant stressors. This is certainly contributing to uncontrolled HTN.

## 2014-04-22 NOTE — Assessment & Plan Note (Signed)
-   Previous work up, per pt report, was negative with ophthalmologist. Neurology did not believe this was due to [place] or other treatable causes. - She also complains of blurry vision which I think may be treatable, so I have recommended optometry for her.

## 2014-04-22 NOTE — Assessment & Plan Note (Signed)
Not taking crestor due to cost. Urged her to contact her pharmacy to send me a prior authorization. This is a very important aspect of her therapy.

## 2014-04-22 NOTE — Assessment & Plan Note (Signed)
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 Picture complicated by significant anxiety disorder and history of non-compliance. BP initially elevated but returned to goal later into the visit when calmer, so will make no medication changes and recheck in 4 weeks.

## 2014-04-22 NOTE — Patient Instructions (Addendum)
-   Make sure you call your pharmacy to ask them to send me a prior authorization for CRESTOR - Make an appointment in a month or less to test your itching, and to test your thyroid.  - Make sure to take your SYNTHROID at night.  - I recommend making an appointment with the optometrist for your eyes.  - Follow up with cardiology as scheduled.  - It is ok to not take your POTASSIUM pills. We'll check this at your next visit.   Take care,  Dr. Bonner Puna

## 2014-04-22 NOTE — Assessment & Plan Note (Signed)
With history of poor control that could be due to medical noncompliance (though patient denies this today) or possibly poor absorption of synthroid. Recent TSH 36, and has been taking 249mcg/day without vomiting or diarrhea so will recheck TSH in ~ 1 month.

## 2014-05-23 ENCOUNTER — Other Ambulatory Visit: Payer: Medicaid Other

## 2014-05-23 ENCOUNTER — Ambulatory Visit (INDEPENDENT_AMBULATORY_CARE_PROVIDER_SITE_OTHER): Payer: Medicaid Other | Admitting: Family Medicine

## 2014-05-23 ENCOUNTER — Encounter: Payer: Self-pay | Admitting: Family Medicine

## 2014-05-23 VITALS — BP 148/96 | HR 91 | Temp 97.9°F | Ht 64.0 in | Wt 133.0 lb

## 2014-05-23 DIAGNOSIS — Z9114 Patient's other noncompliance with medication regimen: Secondary | ICD-10-CM

## 2014-05-23 DIAGNOSIS — E785 Hyperlipidemia, unspecified: Secondary | ICD-10-CM

## 2014-05-23 DIAGNOSIS — E559 Vitamin D deficiency, unspecified: Secondary | ICD-10-CM

## 2014-05-23 DIAGNOSIS — Z8639 Personal history of other endocrine, nutritional and metabolic disease: Secondary | ICD-10-CM

## 2014-05-23 DIAGNOSIS — Z91148 Patient's other noncompliance with medication regimen for other reason: Secondary | ICD-10-CM | POA: Insufficient documentation

## 2014-05-23 DIAGNOSIS — E038 Other specified hypothyroidism: Secondary | ICD-10-CM

## 2014-05-23 MED ORDER — PRAVASTATIN SODIUM 80 MG PO TABS: 80.0000 mg | ORAL_TABLET | Freq: Every day | ORAL | Status: DC

## 2014-05-23 MED ORDER — TRIAMCINOLONE ACETONIDE 0.1 % EX CREA: 1.0000 "application " | TOPICAL_CREAM | Freq: Two times a day (BID) | CUTANEOUS | Status: DC

## 2014-05-23 NOTE — Patient Instructions (Signed)
We are checking some labs that include: - Kidneys - Electrolytes including potassium, sodium, chloride - Liver - Vitamin D level - Thyroid - Blood counts  I've sent in pravastatin to your pharmacy this should be inexpensive but anaimportant medicine You can stop CRESTOR when you start PRAVACHOL  I have sent in a lotion for the itching. It is called triamcinolone (kenalog). You can take this as directed. If this doesn't work give [employer] and we can try something else.

## 2014-05-23 NOTE — Assessment & Plan Note (Signed)
With poor compliance and recent h/o TSH 36. Will recheck today and adjust as necessary.

## 2014-05-23 NOTE — Assessment & Plan Note (Signed)
Continues to be unable to afford crestor despite [facility] assistance. Will therefore prescribe alternative statin. Atorvastatin caused arthralgias at unknown dose. Concerned about simvastatin interactions. Rx pravastatin. Lipid panel not due.

## 2014-05-23 NOTE — Progress Notes (Signed)
Patient ID: [name] Rowe, female   DOB: [birth year], 53 y.o.   MRN: [id number]  Subjective:  [name] Rowe is a 53 y.o. female presenting for groin itching and regular follow up.   She reports itching in the creases of her inguinal folds x several months which has been treated successfully in the past with an unknown lotion. She denies seeing a rash but has intense itching intermittently. No aggravating or alleviating factors. Has not tried anything. No other rashes, fever, vaginal discharge, bleeding, dysuria, urgency, frequency. Not sexually active or douching.   Has a history of medical noncompliance which has affected her health in the past. She states she misses about 2 doses of medication per week on average and did not take her synthroid while visiting friends in [place] for 4-5 days. She has a history of vitamin D insufficiency but stopped taking the weekly supplement due to expense. Has a history of low potassium but the pills were too large so she stopped taking the supplement.   - Review of Systems: Per HPI.  - Smoking status noted Objective:  BP 148/96 mmHg  Pulse 91  Temp(Src) 97.9 F (36.6 C) (Oral)  Ht 5\' 4"  (1.626 m)  Wt 133 lb (60.328 kg)  BMI 22.82 kg/m2 Gen:  53 y.o. female in no distress CV: Regular rate, no murmur; radial, DP and PT pulses 2+ bilaterally; no LE edema, no JVD, cap refill < 2 sec. Pulm: Non-labored breathing ambient air; CTAB, no wheezes or crackles GI: NormoactiveBS; soft, non-tender, non-distended, no organomegaly, no hernia appreciated MSK: Normal gait and station; no digital clubbing/cyanosis, no frank joint deformity/effusion, full active ROM, no paraspinal spasm, no midline tenderness in spine Neurologic Exam: Alert and oriented x4, CN II-XII without deficits, no focal deficits in sensation or motor function, patellar DTRs 2+ bilaterally. No dysmetria or dysdiadochokinesia. Fluent speech without aphasia. Gait normal. Romberg negative. No pronator  drift. Psych: Neatly groomed and appropriately dressed. Maintains good eye contact. Speech is normal volume and rate. Mood is "nervous all the time" and affect is congruent. Thought process is logical and goal directed. No suicidal or homicidal ideation. Does not appear to be responding to any internal stimuli. Assessment/Plan:  [name] Rowe is a 53 y.o. female here for follow up and idiopathic pruritus.   Problem List Items Addressed This Visit      Endocrine   Hypothyroidism (Chronic)    With poor compliance and recent h/o TSH 36. Will recheck today and adjust as necessary.    Relevant Orders      TSH     Other   Hyperlipidemia   Relevant Medications      pravastatin (PRAVACHOL) tablet   Vitamin D insufficiency   Relevant Orders      Vitamin D, 25-hydroxy   History of hypokalemia - Primary   Relevant Orders      Comprehensive metabolic panel

## 2014-05-23 NOTE — Assessment & Plan Note (Signed)
This is her primary issue - established with [facility]. She states she misses about 2 doses of medication per week on average. Stopped synthroid until TSH was found to be 36. She has a history of vitamin D insufficiency but stopped taking the weekly supplement due to expense. Has a history of low potassium but the pills were too large so she stopped taking the supplement. Frequently presents to clinic with HTN reporting she has not taken BP medications.

## 2014-05-24 ENCOUNTER — Encounter: Payer: Self-pay | Admitting: Family Medicine

## 2014-05-24 LAB — COMPREHENSIVE METABOLIC PANEL
ALBUMIN: 3.9 g/dL (ref 3.5–5.2)
ALT: 13 U/L (ref 0–35)
AST: 15 U/L (ref 0–37)
Alkaline Phosphatase: 78 U/L (ref 39–117)
BUN: 22 mg/dL (ref 6–23)
CALCIUM: 9.4 mg/dL (ref 8.4–10.5)
CHLORIDE: 102 meq/L (ref 96–112)
CO2: 26 meq/L (ref 19–32)
CREATININE: 1.01 mg/dL (ref 0.50–1.10)
Glucose, Bld: 73 mg/dL (ref 70–99)
Potassium: 4 mEq/L (ref 3.5–5.3)
SODIUM: 139 meq/L (ref 135–145)
TOTAL PROTEIN: 6.7 g/dL (ref 6.0–8.3)
Total Bilirubin: 0.4 mg/dL (ref 0.2–1.2)

## 2014-05-24 LAB — VITAMIN D 25 HYDROXY (VIT D DEFICIENCY, FRACTURES): VIT D 25 HYDROXY: 28 ng/mL — AB (ref 30–100)

## 2014-05-24 LAB — TSH: TSH: 2.63 u[IU]/mL (ref 0.350–4.500)

## 2014-06-24 ENCOUNTER — Ambulatory Visit (INDEPENDENT_AMBULATORY_CARE_PROVIDER_SITE_OTHER): Payer: Medicaid Other | Admitting: Family Medicine

## 2014-06-24 ENCOUNTER — Encounter: Payer: Self-pay | Admitting: Cardiology

## 2014-06-24 ENCOUNTER — Encounter: Payer: Self-pay | Admitting: Family Medicine

## 2014-06-24 ENCOUNTER — Ambulatory Visit (INDEPENDENT_AMBULATORY_CARE_PROVIDER_SITE_OTHER): Payer: Medicaid Other | Admitting: Cardiology

## 2014-06-24 VITALS — BP 136/88 | HR 88 | Temp 98.5°F | Ht 64.0 in | Wt 139.0 lb

## 2014-06-24 VITALS — BP 142/92 | HR 75 | Ht 64.0 in | Wt 138.0 lb

## 2014-06-24 DIAGNOSIS — F419 Anxiety disorder, unspecified: Secondary | ICD-10-CM

## 2014-06-24 DIAGNOSIS — I10 Essential (primary) hypertension: Secondary | ICD-10-CM

## 2014-06-24 DIAGNOSIS — R072 Precordial pain: Secondary | ICD-10-CM

## 2014-06-24 DIAGNOSIS — E038 Other specified hypothyroidism: Secondary | ICD-10-CM

## 2014-06-24 DIAGNOSIS — R0789 Other chest pain: Secondary | ICD-10-CM

## 2014-06-24 DIAGNOSIS — R600 Localized edema: Secondary | ICD-10-CM | POA: Insufficient documentation

## 2014-06-24 DIAGNOSIS — R609 Edema, unspecified: Secondary | ICD-10-CM

## 2014-06-24 LAB — COMPREHENSIVE METABOLIC PANEL
ALK PHOS: 84 U/L (ref 39–117)
ALT: 17 U/L (ref 0–35)
AST: 16 U/L (ref 0–37)
Albumin: 4 g/dL (ref 3.5–5.2)
BILIRUBIN TOTAL: 0.5 mg/dL (ref 0.2–1.2)
BUN: 13 mg/dL (ref 6–23)
CALCIUM: 9.2 mg/dL (ref 8.4–10.5)
CO2: 29 meq/L (ref 19–32)
CREATININE: 0.95 mg/dL (ref 0.50–1.10)
Chloride: 104 mEq/L (ref 96–112)
GLUCOSE: 98 mg/dL (ref 70–99)
POTASSIUM: 3.8 meq/L (ref 3.5–5.3)
Sodium: 138 mEq/L (ref 135–145)
Total Protein: 6.6 g/dL (ref 6.0–8.3)

## 2014-06-24 LAB — TSH: TSH: 0.041 u[IU]/mL — AB (ref 0.350–4.500)

## 2014-06-24 MED ORDER — LABETALOL HCL 200 MG PO TABS: 800.0000 mg | ORAL_TABLET | Freq: Two times a day (BID) | ORAL | Status: DC

## 2014-06-24 MED ORDER — NIFEDIPINE ER OSMOTIC RELEASE 90 MG PO TB24: 90.0000 mg | ORAL_TABLET | Freq: Every day | ORAL | Status: DC

## 2014-06-24 MED ORDER — KETOROLAC TROMETHAMINE 30 MG/ML IJ SOLN
30.0000 mg | Freq: Once | INTRAMUSCULAR | Status: AC
  Administered 2014-06-24: 30 mg via INTRAMUSCULAR

## 2014-06-24 NOTE — Progress Notes (Signed)
Patient ID: [name] Rowe, female   DOB: 1961/03/06, 54 y.o.   MRN: [id number] PCP: Dr. Adrian Blackwater  54 yo with history of fibromuscular dysplasia of the renal arteries, HTN, and possible coronary vasospasm versus microvascular angina presents for followup.  Patient was admitted to the hospital in [place] in 2009 with severe chest pain.  At that time, she had a left heart cath showing no angiographic coronary disease but the catheter did induce RCA spasm, leading to concern that her chest pain might be coronary vasospasm.  She also had HTN found to be probably related to fibromuscular dysplasia of the renal arteries.  She had a stent placed in the right renal artery in 2009.    She was admitted twice in 1/13, the first time with a hypertensive urgency (she had been off her BP meds for a number of days).  She was re-admitted several days later with orthostatic symptoms and probable rebound tachycardia (she had not picked up her beta blocker prescription after discharge).  She was restarted on beta blocker and HCTZ was stopped.  Carotids in 2/13 showed no significant stenosis and renal artery dopplers in 2/13 showed no significant stenosis.    She was admitted again in 7/13 with atypical chest pain.  She had an ETT-myoview.  This was a submaximal study but showed no ischemia or infarction, EF 77%.  She was in the ER with chest pain in 10/13 but was sent home after cardiac enzymes and ECG were unremarkable. She was admitted again in 5/14 with atypical chest pain, ruled out for MI, and sent home.  She was admitted in 11/15 with atypical chest pain and had negative Cardiolite again.   She had side effects with ranolazine (started for microvascular angina) and had to stop it.  She also had side effects with clonidine.  She continues to have atypical chest pain episodes (nonexertional/sharp) once or twice a week.  They are usually responsive to NTG.  She has not tolerated Imdur due to headache.  No significant  exertional dyspnea.  BP mildly elevated today.  She is taking her meds. She does occasionally feel lightheaded with standing.   Labs (10/11): LDL 136, HDL 44, TGs 159, creatinine 1.06, K 3.8 Labs (11/11): LDL 101, HDL 49, LFTs normal, K 4.8, creatinine 0.83 Labs (1/13): K 4.2, creatinine 1.16, urine metanephrines normal, PRA/aldosterone ratio normal, LDL 156, HDL 56 Labs (7/13): K 3.8, creatinine 0.96, TSH normal, LDL 98, HDL 60 Labs (9/13): LDL 166, TSH normal, K 3.7, creatinine 1.0 Labs (5/14): K 3.6, creatinine 1.16, LDL 168, HDL 51 Labs (7/14): LDL 179 Labs (8/14): K 4.4, creatinine 1.02 Labs (9/14): TSH 34 Labs (1/15): LDL 147, HDL 60 Labs (2/15): K 3.9, creatinine 1.0 Labs (3/15): TSH normal Labs (12/15): K 4, creatinine 1.01  Allergies (verified):  1)  ! Compazine 2)  ! Imitrex  Past Medical History: 1. Migraine headaches 2. Chest pain: No angiographic coronary disease on cath in 2009 in [place].  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  Suspect coronary vasospasm versus microvascular obstruction.  Lexiscan myoview (11/11): EF 79%, normal perfusion.  ETT-myoview (9/13) with EF 77%, no ischemic or infarction but submaximal study.  Headaches with Imdur.  Did not tolerate ranolazine or Imdur.  Lexiscan Cardiolite (11/15): No ischemia or infarction.  3. HTN: Renal artery stenosis has played a role. Urinary metanephrines and plasma renin/aldosterone ratio normal.  Does not tolerate clonidine.  4. 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  She had a renal  artery stent in 2009.  Renal artery dopplers (12/11) suggestive of fibromuscular dysplasia but no significant stenosis. CTA abdomen (1/13) with evidence for fibromuscular dysplasia, right renal artery stent appears patent.  Renal artery dopplers (2/13) with FMD but no evidence for signficant stenosis.  5. Hyperlipidemia 6. Fibromyalgia 7. Echo (1/13) EF 33-82%, grade I diastolic dysfunction. 8. Hypothyroidism 9.  Carotid dopplers (2/13): consistent with FMD but no significant stenosis.  10. Fibromuscular dysplasia.  11. L-spine degenerative disc disease 12. Holter (2/15): No significant arrhythmia.     Family History: Mother with MI at 10, Father with MI at 68, brother with MI at 15 and died with stroke at 31  Social History: Moved to [place] from [place] with her husband.  Denies smoking cigarettes or using illegal drugs Alcohol Use - no Drug Use - no  ROS: All systems reviewed and negative except as per HPI.    Current Outpatient Prescriptions  Medication Sig Dispense Refill  . aspirin EC 81 MG tablet Take 81 mg by mouth daily.    . busPIRone (BUSPAR) 7.5 MG tablet Take 1 tablet (7.5 mg total) by mouth 2 (two) times daily. 60 tablet 0  . gabapentin (NEURONTIN) 300 MG capsule Take 2 caps (600 mg) in AM and afternoon and 4 caps (1200mg ) at bedtime (Patient taking differently: Take 600-1,200 mg by mouth 2 (two) times daily. Take 2 caps (600 mg) in AM and afternoon and 4 caps (1200mg ) at bedtime) 240 capsule 3  . labetalol (NORMODYNE) 200 MG tablet Take 4 tablets (800 mg total) by mouth 2 (two) times daily. 720 tablet 1  . levothyroxine (SYNTHROID, LEVOTHROID) 200 MCG tablet Take 200 mcg by mouth daily before breakfast.    . Linaclotide (LINZESS) 290 MCG CAPS capsule Take 1 capsule (290 mcg total) by mouth daily. 31 capsule 11  . meclizine (ANTIVERT) 25 MG tablet Take 1 tablet (25 mg total) by mouth 3 (three) times daily as needed for dizziness. 30 tablet 0  . NIFEdipine (PROCARDIA XL/ADALAT-CC) 90 MG 24 hr tablet Take 1 tablet (90 mg total) by mouth daily. 90 tablet 1  . nitroGLYCERIN (NITROSTAT) 0.4 MG SL tablet Place 1 tablet (0.4 mg total) under the tongue every 5 (five) minutes as needed. x3 doses as needed for chest pain 100 tablet 3  . potassium chloride SA (K-DUR,KLOR-CON) 20 MEQ tablet Take 20 mEq by mouth daily.    . pravastatin (PRAVACHOL) 80 MG tablet Take 1 tablet (80 mg total)  by mouth daily. 90 tablet 3  . QUEtiapine (SEROQUEL) 100 MG tablet Take 100 mg by mouth at bedtime. Per psychiatrist.    . sertraline (ZOLOFT) 50 MG tablet Take 50 mg by mouth daily. Per psychiatrist    . spironolactone (ALDACTONE) 50 MG tablet Take 1 tablet (50 mg total) by mouth daily. 90 tablet 1  . traZODone (DESYREL) 50 MG tablet Take 50 mg by mouth at bedtime.    . triamcinolone cream (KENALOG) 0.1 % Apply 1 application topically 2 (two) times daily. 30 g 0   No current facility-administered medications for this visit.    BP 142/92 mmHg  Pulse 75  Ht 5\' 4"  (1.626 m)  Wt 138 lb (62.596 kg)  BMI 23.68 kg/m2  SpO2 99% General:  Well developed, well nourished, in no acute distress. Neck:  Neck supple, no JVD. No masses, thyromegaly or abnormal cervical nodes. Lungs:  Clear bilaterally to auscultation and percussion. Heart:  Non-displaced PMI, chest non-tender; regular rate and rhythm, S1, S2 without  murmurs, rubs. +S4. Carotid upstroke normal, no bruit.  Pedals normal pulses. No edema, no varicosities. Abdomen:  Bowel sounds positive; abdomen soft and non-tender without masses, organomegaly, or hernias noted. No hepatosplenomegaly. Extremities:  No clubbing or cyanosis. Neurologic:  Alert and oriented x 3. Psych:  Normal affect.  Assessment/Plan:  Chest pain I suspect that the patient has either coronary microvascular disease (Syndrome X) or coronary vasospasm.  She had a cath with no angiographic coronary disease in 2009 and Lexiscan myoview in 11/15 showed no evidence for ischemia or infarction.  She continues to have periodic chest pain episodes responsive to NTG.  She cannot take Imdur due to headaches and she also had side effects with ranolazine.  Currently, symptoms are reasonably controlled and stable.  I suspect stress also plays a role.  HTN  BP mildly elevated but also has some orthostatic symptoms.  Continue current meds.    HYPERLIPIDEMIA  She is now on pravastatin.   Repeat lipids today.   Followup in 6 months.   Loralie Champagne 06/24/2014

## 2014-06-24 NOTE — Patient Instructions (Signed)
Your physician recommends that you have  lab work today--lipid profile.   Your physician wants you to follow-up in: 6 months with Richardson Dopp, [facility]. (July 2016). You will receive a reminder letter in the mail two months in advance. If you don't receive a letter, please call our office to schedule the follow-up appointment.

## 2014-06-24 NOTE — Progress Notes (Signed)
Subjective: [name] Rowe is a 54 y.o. female with a history of medical nonadherence, HTN, h/o CVA, hypothyroidism, vitamin D deficiency, hyperlipidemia, and anxiety here for hands and feet swelling as well as worsening anxiety.  Hand and feet swelling: at night and in the morning. Brings in picture from last night of swollen feet. No change in salt intake, exercise, taking all medicines. No urinary frequency or urgency but does have dysuria.   Chest pain, related mostly to anxiety with palpitations. Nonexertional, sharp to the left, stabbing, lasts 30 min, not relieved by anything in particular. No orthopnea. Wakes up breathless 2x per week . Lexiscan stress test negative Nov 2015. Sees cardiologist today.    "my whole body hurts" even her toes, her shoulders, neck, hands. Gabapentin isn't help at all. Arms are tired. She feels her muscles are generally weak. Worse the past 2 weeks. Has baseline back and hip pain.   Nervous and anxious: Teeth biting, jaw hurts all day. [facility] recently increased zoloft, buspar and trazodone.   - Nonsmoker - Review of Systems: Per HPI. All other systems reviewed and are negative. - Medications: reviewed and updated  Objective: BP 136/88 mmHg  Pulse 88  Temp(Src) 98.5 F (36.9 C) (Oral)  Ht 5\' 4"  (1.626 m)  Wt 139 lb (63.05 kg)  BMI 23.85 kg/m2 Gen:  54 y.o. female in no distress HEENT: MMM, EOMI, PERRL, anicteric sclerae Neck: No palpable thyroid abnormalities.  CV: Regular rate, no murmur, rub or gallop, no JVD Resp: Non-labored, CTAB, no wheezes noted Abd: Soft, NT, ND, BS present, no guarding or organomegaly MSK: No edema noted, full ROM Neuro: Alert and oriented, speech normal  Lab Results  Component Value Date   TSH 2.630 05/23/2014   Assessment/Plan: [name] Rowe is a 54 y.o. female here for extremity swelling and worsening anxiety.  Recheck TSH, normal 1 month ago but with drastic change.  Swelling: Check LFT, kidneys

## 2014-06-25 LAB — LIPID PANEL
CHOL/HDL RATIO: 3
Cholesterol: 148 mg/dL (ref 0–200)
HDL: 51.9 mg/dL (ref >39.00)
LDL CALC: 75 mg/dL (ref 0–99)
NONHDL: 96.1
TRIGLYCERIDES: 104 mg/dL (ref 0.0–149.0)
VLDL: 20.8 mg/dL (ref 0.0–40.0)

## 2014-07-01 ENCOUNTER — Telehealth: Payer: Self-pay | Admitting: Family Medicine

## 2014-07-01 DIAGNOSIS — E038 Other specified hypothyroidism: Secondary | ICD-10-CM

## 2014-07-01 DIAGNOSIS — I10 Essential (primary) hypertension: Secondary | ICD-10-CM

## 2014-07-01 MED ORDER — LEVOTHYROXINE SODIUM 175 MCG PO TABS: 175.0000 ug | ORAL_TABLET | Freq: Every day | ORAL | Status: DC

## 2014-07-01 NOTE — Assessment & Plan Note (Addendum)
Over-corrected on synthroid 213mcg daily. This is likely contributing to anxiety, palpitations.  - Will lower dose mildly, recheck in 6-8 weeks.  It appears she has very spotty adherence to the regimen given the results from previous 18 months.   Ref. Range  10/15/2012  02/19/2013 04/06/2013  06/19/2013 08/10/2013 04/15/2014 05/23/2014 06/24/2014  TSH  0.350-4.500  1.658 34.375 (H) 5.635 (H) 78.332 (H) 3.219 36.450 (H) 2.630 0.041 (L)

## 2014-07-01 NOTE — Telephone Encounter (Signed)
TSH low, will decrease synthroid. See problem list.

## 2014-07-02 NOTE — Assessment & Plan Note (Signed)
No other stigmata of CHF. Recheck TSH, LFT, kidneys.

## 2014-07-02 NOTE — Telephone Encounter (Signed)
LVM for patient to call back to inform her of below

## 2014-07-02 NOTE — Assessment & Plan Note (Signed)
Recheck TSH, normal 1 month ago but with drastic change. Could contribute to anxiety and possibly swelling.

## 2014-07-24 ENCOUNTER — Other Ambulatory Visit: Payer: Self-pay | Admitting: Family Medicine

## 2014-08-09 ENCOUNTER — Ambulatory Visit: Payer: Medicaid Other | Admitting: Family Medicine

## 2014-08-14 ENCOUNTER — Ambulatory Visit (INDEPENDENT_AMBULATORY_CARE_PROVIDER_SITE_OTHER): Payer: Medicaid Other | Admitting: Family Medicine

## 2014-08-14 ENCOUNTER — Encounter: Payer: Self-pay | Admitting: Family Medicine

## 2014-08-14 VITALS — BP 141/81 | HR 106 | Temp 98.7°F | Ht 64.0 in | Wt 140.2 lb

## 2014-08-14 DIAGNOSIS — E038 Other specified hypothyroidism: Secondary | ICD-10-CM

## 2014-08-14 DIAGNOSIS — M797 Fibromyalgia: Secondary | ICD-10-CM

## 2014-08-14 DIAGNOSIS — Z0000 Encounter for general adult medical examination without abnormal findings: Secondary | ICD-10-CM

## 2014-08-14 LAB — TSH: TSH: 0.206 u[IU]/mL — ABNORMAL LOW (ref 0.350–4.500)

## 2014-08-14 LAB — CBC
HEMATOCRIT: 42.5 % (ref 36.0–46.0)
Hemoglobin: 14.2 g/dL (ref 12.0–15.0)
MCH: 29.2 pg (ref 26.0–34.0)
MCHC: 33.4 g/dL (ref 30.0–36.0)
MCV: 87.3 fL (ref 78.0–100.0)
MPV: 9.6 fL (ref 8.6–12.4)
PLATELETS: 323 10*3/uL (ref 150–400)
RBC: 4.87 MIL/uL (ref 3.87–5.11)
RDW: 13.3 % (ref 11.5–15.5)
WBC: 5.4 10*3/uL (ref 4.0–10.5)

## 2014-08-14 LAB — POCT SEDIMENTATION RATE: POCT SED RATE: 23 mm/hr — AB (ref 0–22)

## 2014-08-14 NOTE — Assessment & Plan Note (Signed)
6-week recheck of TSH today following synthroid dose lowering.

## 2014-08-14 NOTE — Progress Notes (Signed)
Subjective: [name] Rowe is a 54 y.o. female patient of mine with a history of fibromyalgia dx >15 years ago presenting for generalized body pain x 3 years worse in the past 2 months.  [name] Rowe describes unprovoked "lines" of 10 out of 10 pain on her arms, her legs and feet which last < 1 min and dissipate without intervention. The pain is always present, but worse during these episodes, and is worsened by most movements and contact. She also endorses fatigue, anxiety, and severe nighttime itching in the inguinal folds. She denies vulvodynia, vaginal discharge or itching, but scratches the inguinal folds enough to cause bleeding. This wakes her from sleep at times. She has tried triamcinolone and many other OTC topical treatments without improvement. She was seen by rheumatology in the past for fibromyalgia. Inflammatory rheumatologic disorders were ruled out at that time.   - ROS: No fevers, chills, weight loss, joint pain/erythema/swelling, trauma, HA, vision changes, chest pain, SOB, abd pain, N/V/D/C, or leg swelling.  - Nonsmoker  Objective: BP 141/81 mmHg  Pulse 106  Temp(Src) 98.7 F (37.1 C) (Oral)  Ht 5\' 4"  (1.626 m)  Wt 140 lb 3.2 oz (63.594 kg)  BMI 24.05 kg/m2 Gen: Anxious well-appearing 54 y.o. female in no distress MSK: Normal gait and station; no digital clubbing/cyanosis, no frank joint deformities/effusions and no tenderness of joints, full active ROM, no paraspinal spasm, no midline tenderness in spine. Tenderness to palpation of the following bilateral locations:  - under lower SCM - middle trapezius - origin or supraspinatus - 2nd costochondral junction - upper, outer buttock - overlaying the greater trochanter - distal to lateral epicondyles No tenderness to palpation to the following bilateral locations: - medial knee fat pad - insertion of suboccipital muscle Neuro: Alert and oriented x4, no CN deficits, no focal deficits in sensation or motor function,  patellar, brachioradialis, triceps DTRs 2+ bilaterally. Pelvic: External genitalia within normal limits. early changes of atrophy noted along left inguinal fold with moderate excoriations noted in folds bilaterally. No surrounding erythema or drainage.   Mercer Pod, CMA present throughout duration of exam.   Assessment/Plan: [name] Rowe is a 54 y.o. female here for chronic generalized body pain.  See problem list for plan.

## 2014-08-14 NOTE — Assessment & Plan Note (Signed)
+  14 FM tender points on exam in setting of chronic musculoskeletal pain. Exam is non-focal.  - Reviewed disease concept of hyperalgesia and appropriate treatments.  - Will give cymbalta another try for at least 2-3 months this time.

## 2014-08-14 NOTE — Patient Instructions (Signed)
START taking cymbalta 60mg  and we will see if this helps after about 2 months.  START taking atarax every evening for itching We will contact you with the results of the blood tests including your thyroid.   I hope you find relief soon.  Taker care,  - Dr. Bonner Puna

## 2014-08-15 ENCOUNTER — Other Ambulatory Visit: Payer: Self-pay | Admitting: Family Medicine

## 2014-08-15 ENCOUNTER — Telehealth: Payer: Self-pay | Admitting: Family Medicine

## 2014-08-15 DIAGNOSIS — L900 Lichen sclerosus et atrophicus: Secondary | ICD-10-CM | POA: Insufficient documentation

## 2014-08-15 DIAGNOSIS — E038 Other specified hypothyroidism: Secondary | ICD-10-CM

## 2014-08-15 DIAGNOSIS — M797 Fibromyalgia: Secondary | ICD-10-CM

## 2014-08-15 LAB — HIV ANTIBODY (ROUTINE TESTING W REFLEX): HIV 1&2 Ab, 4th Generation: NONREACTIVE

## 2014-08-15 MED ORDER — DULOXETINE HCL 60 MG PO CPEP: 60.0000 mg | ORAL_CAPSULE | Freq: Every day | ORAL | Status: DC

## 2014-08-15 MED ORDER — LEVOTHYROXINE SODIUM 150 MCG PO TABS: 150.0000 ug | ORAL_TABLET | Freq: Every day | ORAL | Status: DC

## 2014-08-15 MED ORDER — HYDROXYZINE HCL 10 MG PO TABS: 10.0000 mg | ORAL_TABLET | Freq: Every evening | ORAL | Status: DC | PRN

## 2014-08-15 NOTE — Telephone Encounter (Signed)
cymbalta adna atarax RX from yesterdays visit was not received by [facility]

## 2014-08-15 NOTE — Telephone Encounter (Signed)
TSH still overly suppressed on 186mcg/day. My suspicion is that the dose was historically elevated by providers due to her noncompliance imitating subtherapeutic doses. Will decrease to 190mcg, aiming for low-normal TSH. Recheck 6-8 weeks.

## 2014-08-17 ENCOUNTER — Other Ambulatory Visit: Payer: Self-pay | Admitting: Family Medicine

## 2014-08-19 ENCOUNTER — Other Ambulatory Visit: Payer: Self-pay | Admitting: *Deleted

## 2014-08-26 ENCOUNTER — Ambulatory Visit (INDEPENDENT_AMBULATORY_CARE_PROVIDER_SITE_OTHER): Payer: Medicaid Other | Admitting: Family Medicine

## 2014-08-26 ENCOUNTER — Encounter: Payer: Self-pay | Admitting: Family Medicine

## 2014-08-26 VITALS — BP 143/91 | HR 103 | Temp 98.5°F | Ht 64.0 in | Wt 140.1 lb

## 2014-08-26 DIAGNOSIS — L299 Pruritus, unspecified: Secondary | ICD-10-CM | POA: Insufficient documentation

## 2014-08-26 MED ORDER — BETAMETHASONE DIPROPIONATE 0.05 % EX OINT: TOPICAL_OINTMENT | Freq: Two times a day (BID) | CUTANEOUS | Status: DC

## 2014-08-26 MED ORDER — LINACLOTIDE 290 MCG PO CAPS: 290.0000 ug | ORAL_CAPSULE | Freq: Every day | ORAL | Status: DC

## 2014-08-26 NOTE — Patient Instructions (Addendum)
It was nice to see you today.  Use Benadryl as needed for itching.  Avoid frequent shaving/trimming.  Use the betamethasone as prescribed.  Follow up closely with your PCP.  Take care  Dr. Lacinda Axon

## 2014-08-26 NOTE — Assessment & Plan Note (Signed)
Unclear etiology. Exam unremarkable except for mild erythema noted in the pelvic region. This appears to be secondary to scratching and possibly shaving. I advised PRN Benadryl and sent in a prescription for topical steroid (betamethasone). Patient to follow closely with PCP.

## 2014-08-26 NOTE — Progress Notes (Signed)
   Subjective:    Patient ID: [name] Rowe, female    DOB: 01-05-1961, 54 y.o.   MRN: [id number]  HPI 54 year old female with a complex past medical history including anxiety, depression, fibromyalgia presents for same day appointment with complaints of itching.  1) Itching  Patient reports that she's been expressing groin itching that is been worsening over the past month.  She was recently seen by her PCP prescribed a topical steroid and Atarax.  She states that she has had no improvement since that time.  In fact, she has had worsening of her symptoms.  She reports increased itching particularly at night.  She has not noticed any rash but now has had some redness secondary to scratching/excoriation.  No other complaints at this time.  No recent fever. No recent medication changes.  Review of Systems Per HPI    Objective:   Physical Exam Filed Vitals:   08/26/14 1437  BP: 143/91  Pulse: 103  Temp: 98.5 F (36.9 C)   Exam: General: well appearing, NAD. Skin: Erythema noted in the pelvic region.  No evident rash. Erythema likely secondary to excoriation/scratching.  No rash noted in the inguinal crease or around the perineal region.    Assessment & Plan:  See problem list

## 2014-08-29 NOTE — Progress Notes (Signed)
I was the preceptor for this encounter. Zera Markwardt, M.D. 

## 2014-11-03 ENCOUNTER — Other Ambulatory Visit: Payer: Self-pay | Admitting: Family Medicine

## 2014-11-04 ENCOUNTER — Emergency Department (INDEPENDENT_AMBULATORY_CARE_PROVIDER_SITE_OTHER)
Admission: EM | Admit: 2014-11-04 | Discharge: 2014-11-04 | Disposition: A | Discharge status: Home / Self Care | Payer: Medicaid Other | Source: Home / Self Care | Attending: Family Medicine | Admitting: Family Medicine

## 2014-11-04 ENCOUNTER — Encounter (HOSPITAL_COMMUNITY): Payer: Self-pay | Admitting: Emergency Medicine

## 2014-11-04 DIAGNOSIS — N764 Abscess of vulva: Secondary | ICD-10-CM | POA: Diagnosis not present

## 2014-11-04 MED ORDER — SULFAMETHOXAZOLE-TRIMETHOPRIM 800-160 MG PO TABS: 2.0000 | ORAL_TABLET | Freq: Two times a day (BID) | ORAL | Status: DC

## 2014-11-04 NOTE — ED Notes (Signed)
C/o chest pain and abscess States she has an abscess near her groin area for two weeks States she has hx and had one surgical removed  States she has been having a pinching feeling chest pain

## 2014-11-04 NOTE — ED Provider Notes (Signed)
[name] Rowe is a 54 y.o. female who presents to Urgent Care today for abscess. Patient has an abscess on her left labia present for 2 weeks. It is mildly painful. No fevers chills nausea vomiting or diarrhea. She feels well otherwise with no significant chest pain or palpitations. She takes her blood pressure medications regularly.   Past Medical History  Diagnosis Date  . Migraine headache   . Chest pain     a. No angiographic CAD by cath 2009 in [place]. There was catheter-induced RCA vasospasm versus microvascular obstruction. bCarlton Adam myoview 04/2010 - EF 79%, normal perfusion.;  c. ETT-Myoview 7/13: no ischemia, EF 77%, submax exercise  . Renal artery stenosis     a. Due to fibromuscular dysplasia. b. Renal stent 2009. c. Studies:  CTA abdomen (1/13) with evidence for fibromuscular dysplasia, right renal artery stent appears patent. Renal artery dopplers (2/13) with FMD but no evidence for signficant stenosis.   . Hypothyroidism 2003    Graves disease s/p radio-iodine treatment in 2003   . Fibromuscular dysplasia 2009    a. Renal artery stenosis due to this. b. Carotid dopplers 07/2011 c/w FMD but no significant stenosis  . Tachycardia 2000    10/27/10-Holter -NSR with occ sinus tachy-rare PACs, no sig arrhythmia  . Hx of echocardiogram     a. Echo (1/13): EF 76-73%, grade 1 diastolic dysfunction  . HTN (hypertension) 1988    a. Likely related to RAS; urinary metanephrines and plasma renin/aldosterone ratio normal. b. H/o HTN urgency 06/2011 after being off BP meds for a number of days  . HLD (hyperlipidemia) 2000  . Myocardial infarction 2009  . Anxiety 2001  . Depression 2001  . Hypertensive crisis 06/29/2011  . Leiomyoma   . Adenomyosis   . Fibromyalgia 1999  . Coronary vasospasm   . Chronic back pain     had 3 epidural injections on 02-14-13  . Hearing loss of left ear     s/p CVA 2006  . Personal history of colonic polyps-sessile serrated adenoma 02/23/2013  . CVA  (cerebral vascular accident) 2006    lost hearing on left  . Irritable bowel syndrome   . Complication of anesthesia     has difficulty voiding after surgery  . H/O Graves' disease 2003    s/p radio-iodine treatment in 2003    Past Surgical History  Procedure Laterality Date  . Renal artery stent  2009  . Spine surgery    . Back surgery  2010 and 2011    In CT.  11/19/2008 (possibly a laminectomy) and in May of 2011 she underwent a L2-L3 fusion.  . Robotic assisted lap vaginal hysterectomy  05/13/2008    McConnell hysterectomy  for myomatous uterus   . Colonoscopy    . Colonoscopy    . Cardiac catheterization  2009    states it was normal  . Vaginal hysterectomy    . Knee arthroscopy Left   . Hip surgery Bilateral 1988 and 1989    "scraped head of femur"  . Lumbar laminectomy/decompression microdiscectomy Left 05/25/2013    Procedure: LUMBAR LAMINECTOMY/DECOMPRESSION MICRODISCECTOMY 1 LEVEL;  Surgeon: Winfield Cunas, MD;  Location: [facility];  Service: Neurosurgery;  Laterality: Left;  LEFT L5S1 microdiskectomy   History  Substance Use Topics  . Smoking status: Never Smoker   . Smokeless tobacco: Never Used  . Alcohol Use: Yes     Comment: very rarely   ROS as above Medications: No current facility-administered  medications for this encounter.   Current Outpatient Prescriptions  Medication Sig Dispense Refill  . aspirin EC 81 MG tablet Take 81 mg by mouth daily.    . betamethasone dipropionate (DIPROLENE) 0.05 % ointment Apply topically 2 (two) times daily. 30 g 0  . busPIRone (BUSPAR) 7.5 MG tablet Take 1 tablet (7.5 mg total) by mouth 2 (two) times daily. 60 tablet 0  . DULoxetine (CYMBALTA) 60 MG capsule Take 1 capsule (60 mg total) by mouth daily. 30 capsule 3  . gabapentin (NEURONTIN) 300 MG capsule Take 2 caps (600 mg) in AM and afternoon and 4 caps (1200mg ) at bedtime (Patient taking differently: Take 600-1,200 mg by mouth 2 (two) times daily. Take 2 caps (600  mg) in AM and afternoon and 4 caps (1200mg ) at bedtime) 240 capsule 3  . hydrOXYzine (ATARAX/VISTARIL) 10 MG tablet Take 1 tablet (10 mg total) by mouth at bedtime as needed for itching. 30 tablet 0  . labetalol (NORMODYNE) 200 MG tablet Take 4 tablets (800 mg total) by mouth 2 (two) times daily. 720 tablet 1  . levothyroxine (SYNTHROID, LEVOTHROID) 150 MCG tablet Take 1 tablet (150 mcg total) by mouth daily before breakfast. 60 tablet 0  . Linaclotide (LINZESS) 290 MCG CAPS capsule Take 1 capsule (290 mcg total) by mouth daily. 31 capsule 11  . meclizine (ANTIVERT) 25 MG tablet Take 1 tablet (25 mg total) by mouth 3 (three) times daily as needed for dizziness. 30 tablet 0  . NIFEdipine (PROCARDIA XL/ADALAT-CC) 90 MG 24 hr tablet Take 1 tablet (90 mg total) by mouth daily. 90 tablet 1  . nitroGLYCERIN (NITROSTAT) 0.4 MG SL tablet Place 1 tablet (0.4 mg total) under the tongue every 5 (five) minutes as needed. x3 doses as needed for chest pain 100 tablet 3  . potassium chloride SA (K-DUR,KLOR-CON) 20 MEQ tablet Take 20 mEq by mouth daily.    . pravastatin (PRAVACHOL) 80 MG tablet Take 1 tablet (80 mg total) by mouth daily. 90 tablet 3  . QUEtiapine (SEROQUEL) 100 MG tablet Take 100 mg by mouth at bedtime. Per psychiatrist.    . sertraline (ZOLOFT) 50 MG tablet Take 50 mg by mouth daily. Per psychiatrist    . spironolactone (ALDACTONE) 50 MG tablet Take 1 tablet (50 mg total) by mouth daily. 90 tablet 1  . sulfamethoxazole-trimethoprim (BACTRIM DS,SEPTRA DS) 800-160 MG per tablet Take 2 tablets by mouth 2 (two) times daily. 28 tablet 0  . traZODone (DESYREL) 50 MG tablet Take 50 mg by mouth at bedtime.     Allergies  Allergen Reactions  . Hydromorphone Hcl Other (See Comments)    Palpitations  . Prochlorperazine Edisylate Other (See Comments)    Causes Seizures   . Sumatriptan Palpitations  . Atorvastatin Other (See Comments)    Joint pain  . Tramadol Anxiety     Exam:  BP 173/105 mmHg   Pulse 106  Temp(Src) 98.9 F (37.2 C) (Oral)  Resp 16  SpO2 100% Gen: Well NAD HEENT: EOMI,  MMM Lungs: Normal work of breathing. CTABL Heart: RRR no MRG Abd: NABS, Soft. Nondistended, Nontender Exts: Brisk capillary refill, warm and well perfused.  Labia: Small erythematous area left labia with central fluctuance. Tender to touch.  Abscess incision and drainage. Consent obtained and timeout performed. Skin cleaned with alcohol, and cold spray applied. 1 mL of lidocaine with epi injected achieving good anesthesia. Skin was again cleaned with alcohol. A sharp incision was made to the area of fluctuance. The  incision was widened and pus was expressed. Pus was cultured. Blunt dissection was used to break up loculations. Further pus was expressed. Patient tolerated the procedure well. A dressing was applied   No results found for this or any previous visit (from the past 24 hour(s)). No results found.  Assessment and Plan: 54 y.o. female with left labial abscess status post incision and drainage. Culture pending. Treat with Bactrim. Follow-up with PCP regarding elevated blood pressure  Discussed warning signs or symptoms. Please see discharge instructions. Patient expresses understanding.     Gregor Hams, MD 11/04/14 [phone]

## 2014-11-04 NOTE — Discharge Instructions (Signed)
Thank you for coming in today.  Abscess Care After An abscess (also called a boil or furuncle) is an infected area that contains a collection of pus. Signs and symptoms of an abscess include pain, tenderness, redness, or hardness, or you may feel a moveable soft area under your skin. An abscess can occur anywhere in the body. The infection may spread to surrounding tissues causing cellulitis. A cut (incision) by the surgeon was made over your abscess and the pus was drained out. Gauze may have been packed into the space to provide a drain that will allow the cavity to heal from the inside outwards. The boil may be painful for 5 to 7 days. Most people with a boil do not have high fevers. Your abscess, if seen early, may not have localized, and may not have been lanced. If not, another appointment may be required for this if it does not get better on its own or with medications. HOME CARE INSTRUCTIONS   Only take over-the-counter or prescription medicines for pain, discomfort, or fever as directed by your caregiver.  When you bathe, soak and then remove gauze or iodoform packs at least daily or as directed by your caregiver. You may then wash the wound gently with mild soapy water. Repack with gauze or do as your caregiver directs. SEEK IMMEDIATE MEDICAL CARE IF:   You develop increased pain, swelling, redness, drainage, or bleeding in the wound site.  You develop signs of generalized infection including muscle aches, chills, fever, or a general ill feeling.  An oral temperature above 102 F (38.9 C) develops, not controlled by medication. See your caregiver for a recheck if you develop any of the symptoms described above. If medications (antibiotics) were prescribed, take them as directed. Document Released: 12/10/2004 Document Revised: 08/16/2011 Document Reviewed: 08/07/2007 ExitCare Patient Information 2015 [employer]. This information is not intended to replace advice given to you by your  health care provider. Make sure you discuss any questions you have with your health care provider.  

## 2014-11-05 ENCOUNTER — Telehealth: Payer: Self-pay | Admitting: *Deleted

## 2014-11-05 NOTE — Telephone Encounter (Signed)
-----   Message from Patrecia Pour, MD sent at 11/05/2014 10:36 AM EDT ----- Ms. Zurita really needs to make an appointment to have her thyroid checked after the dose of synthroid was changed. Could you please facilitate this?

## 2014-11-05 NOTE — Telephone Encounter (Signed)
Contacted pt.  She is agreeable to coming in but she would like to switch providers.  She states that "the last time I saw him he told me he did not know what was wrong and I have a lot of issues.  I like him as a person but not as a doctor".  Advised I would send a message to Surveyor, quantity. Fleeger, Salome Spotted

## 2014-11-07 LAB — CULTURE, ROUTINE-ABSCESS

## 2014-11-12 NOTE — ED Notes (Signed)
Final wound culture organism id'd as sensitive to Rx provided . No further action required

## 2014-11-19 NOTE — Telephone Encounter (Signed)
Returned call to patient and left message to call our office back.  Burna Forts, BSN, RN-BC

## 2015-01-29 ENCOUNTER — Telehealth: Payer: Self-pay | Admitting: Cardiology

## 2015-01-29 ENCOUNTER — Ambulatory Visit (INDEPENDENT_AMBULATORY_CARE_PROVIDER_SITE_OTHER): Payer: Medicaid Other | Admitting: Cardiology

## 2015-01-29 ENCOUNTER — Encounter: Payer: Self-pay | Admitting: Cardiology

## 2015-01-29 VITALS — BP 128/90 | HR 95 | Ht 64.0 in | Wt 140.0 lb

## 2015-01-29 DIAGNOSIS — R079 Chest pain, unspecified: Secondary | ICD-10-CM

## 2015-01-29 DIAGNOSIS — IMO0001 Reserved for inherently not codable concepts without codable children: Secondary | ICD-10-CM

## 2015-01-29 DIAGNOSIS — I10 Essential (primary) hypertension: Secondary | ICD-10-CM | POA: Diagnosis not present

## 2015-01-29 DIAGNOSIS — Z8673 Personal history of transient ischemic attack (TIA), and cerebral infarction without residual deficits: Secondary | ICD-10-CM

## 2015-01-29 DIAGNOSIS — Z0389 Encounter for observation for other suspected diseases and conditions ruled out: Secondary | ICD-10-CM

## 2015-01-29 DIAGNOSIS — I773 Arterial fibromuscular dysplasia: Secondary | ICD-10-CM

## 2015-01-29 NOTE — Telephone Encounter (Signed)
New Message  Pt c/o of Chest Pain: 1. Are you having CP right now? No  2. Are you experiencing any other symptoms (ex. SOB, nausea, vomiting, sweating)? Yes, Nausea 3. How long have you been experiencing CP? Within 24 Hours  4. Is your CP continuous or coming and going? Coming and going  5. Have you taken Nitroglycerin? Yes she took 1 and 3 Asprin's    Comments: Chest is tight and arms feel tight as well and Heavy. She felt yesterday as if someone was chocking her

## 2015-01-29 NOTE — Assessment & Plan Note (Signed)
Hx of RA stent 2009

## 2015-01-29 NOTE — Assessment & Plan Note (Signed)
Normal Myoview Nov 2015

## 2015-01-29 NOTE — Assessment & Plan Note (Signed)
Remote lacunar infarct on MRI 2015

## 2015-01-29 NOTE — Assessment & Plan Note (Signed)
Controlled.  

## 2015-01-29 NOTE — Patient Instructions (Signed)
Medication Instructions:  Your physician recommends that you continue on your current medications as directed. Please refer to the Current Medication list given to you today.   Labwork: None ordered   Testing/Procedures: None ordered  Follow-Up: We will discuss your visit and symptoms with your primary cardiologist and call you with further instructions. If you have any reoccurrence of prolonged chest pain you should go to the ED   Any Other Special Instructions Will Be Listed Below (If Applicable).

## 2015-01-29 NOTE — Assessment & Plan Note (Signed)
Seen in [facility] today with chest pain complaints.

## 2015-01-29 NOTE — Progress Notes (Signed)
[birth year] [name] Rowe   [birth year]  [id number]  Primary Physician Vance Gather, MD Primary Cardiologist: Dr Aundra Dubin  HPI:  54 y/o F with hx of HTN, CVA 2006, fibromuscular dysplasia/RAS s/p RA stent 2009, and cath in 2009 in [place] showing no angiographic CAD, but evidence of catheter induced RCA spasm. She has had chronic, intermittent chest. that has been felt due to possibly be due to coronary vasospasm vs microsvascular obstruction. She had a negative nuc in Nov 2015.   She is seen in [facility] as an add on this afternoon for chest pain. She complains of Lt lateral chest pain and arm heaviness. She also complains of palpitations and "hard beats". She has been taking NTG for her chest pain and claims it helps.      Current Outpatient Prescriptions  Medication Sig Dispense Refill  . aspirin EC 81 MG tablet Take 81 mg by mouth daily.    . hydrOXYzine (ATARAX/VISTARIL) 10 MG tablet Take 1 tablet (10 mg total) by mouth at bedtime as needed for itching. 30 tablet 0  . labetalol (NORMODYNE) 200 MG tablet Take 4 tablets (800 mg total) by mouth 2 (two) times daily. 720 tablet 1  . levothyroxine (SYNTHROID, LEVOTHROID) 150 MCG tablet TAKE ONE TABLET BY MOUTH DAILY BEFORE BREAKFAST 30 tablet 0  . Linaclotide (LINZESS) 290 MCG CAPS capsule Take 1 capsule (290 mcg total) by mouth daily. 31 capsule 11  . meclizine (ANTIVERT) 25 MG tablet Take 1 tablet (25 mg total) by mouth 3 (three) times daily as needed for dizziness. 30 tablet 0  . NIFEdipine (PROCARDIA XL/ADALAT-CC) 90 MG 24 hr tablet Take 1 tablet (90 mg total) by mouth daily. 90 tablet 1  . nitroGLYCERIN (NITROSTAT) 0.4 MG SL tablet Place 1 tablet (0.4 mg total) under the tongue every 5 (five) minutes as needed. x3 doses as needed for chest pain 100 tablet 3  . QUEtiapine (SEROQUEL) 100 MG tablet Take 100 mg by mouth at bedtime. Per psychiatrist.    . rosuvastatin (CRESTOR) 40 MG tablet Take 40 mg by mouth daily.    . sertraline  (ZOLOFT) 50 MG tablet Take 50 mg by mouth daily. Per psychiatrist     No current facility-administered medications for this visit.    Allergies  Allergen Reactions  . Hydromorphone Hcl Other (See Comments)    Palpitations  . Prochlorperazine Edisylate Other (See Comments)    Causes Seizures   . Sumatriptan Palpitations  . Atorvastatin Other (See Comments)    Joint pain  . Tramadol Anxiety    Social History   Social History  . Marital Status: Divorced    Spouse Name: N/A  . Number of Children: 2  . Years of Education: N/A   Occupational History  . worked in a Development worker, [employer].     Quit about a yr ago because fo work related injury to left knee   Social History Main Topics  . Smoking status: Never Smoker   . Smokeless tobacco: Never Used  . Alcohol Use: Yes     Comment: very rarely  . Drug Use: No  . Sexual Activity: Not Currently   Other Topics Concern  . Not on file   Social History Narrative   Second Husband is currently in jail for an offense committed in [place] and he is awaiting extradition.   Lives alone.    Jehovah's witness.    Walks 20-30 minutes.  Review of Systems: General: negative for chills, fever, night sweats or weight changes.  Cardiovascular: negative for chest pain, dyspnea on exertion, edema, orthopnea, palpitations, paroxysmal nocturnal dyspnea or shortness of breath Dermatological: negative for rash Respiratory: negative for cough or wheezing Urologic: negative for hematuria Abdominal: negative for nausea, vomiting, diarrhea, bright red blood per rectum, melena, or hematemesis Neurologic: negative for visual changes, syncope, or dizziness All other systems reviewed and are otherwise negative except as noted above.    Blood pressure 128/90, pulse 95, height 5\' 4"  (1.626 m), weight 140 lb (63.504 kg).  General appearance: alert, cooperative and no distress Neck: no carotid bruit and no JVD Lungs: clear to  auscultation bilaterally Heart: regular rate and rhythm Extremities: extremities normal, atraumatic, no cyanosis or edema Pulses: 2+ and symmetric Skin: Skin color, texture, turgor normal. No rashes or lesions Neurologic: Grossly normal  EKG NSR  ASSESSMENT AND PLAN:   Chest pain Seen in [facility] today with chest pain complaints.  Essential hypertension Controlled  History of CVA (cerebrovascular accident) Remote lacunar infarct on MRI 2015  Fibromuscular dysplasia Hx of RA stent 2009  Normal coronary arteries in [place] 2009 Normal Myoview Nov 2015   PLAN  I saw [name] in the office this afternoon when the com[puter system was down and I had no access to her records. I told her I was not convinced her symptoms were angina. She recently had a Myoview. I told her I would review this with Dr Aundra Dubin and the nursing staff would contact her for any further instructions.   Kerin Ransom K PA-C 01/29/2015 5:15 PM

## 2015-01-29 NOTE — Telephone Encounter (Signed)
Calling stating that she hasn't felt good for a couple of weeks but last night had chest pain, choking sensation that made her SOB, arms felt heavy and nauseated. States she took a NTG and 3 ASA and had relief after 5 min. No c/o chest pain today but does have a heaviness in chest and arms still feel heavy.  Has not taken a NTG today.  BP 134/90 HR 102.  According to her history she had side effects with taking Imdur and Ranexa  Spoke w/Luke Dyann Kief, who will see her today a 4:00.  Advised to bring her medications and arrive at 3:30. She verbalizes understanding and will keep appointment.

## 2015-01-31 ENCOUNTER — Telehealth: Payer: Self-pay | Admitting: Cardiology

## 2015-01-31 ENCOUNTER — Telehealth: Payer: Self-pay

## 2015-01-31 NOTE — Telephone Encounter (Signed)
Left mess to call back .

## 2015-01-31 NOTE — Addendum Note (Signed)
Addended by: Erlene Quan on: 01/31/2015 11:13 AM   Modules accepted: Orders

## 2015-01-31 NOTE — Telephone Encounter (Signed)
Called patient about setting up a time for left heart cath. Patient is available to come in on Monday. Called Cath Lab and patient will be having procedure at 12:00 on 02/03/2015 with Dr. Aundra Dubin. Went over instructions for procedure and will also leave copy at the front desk. Patient verbalized understanding and will arrive at 10:00 at Short Stay at [facility] on 02/03/2015.

## 2015-01-31 NOTE — Telephone Encounter (Signed)
I spoke with the pt. She says she is still having chest tightness and arm weakness and aching. Discussed with Dr Aundra Dubin who feels we should proceed with diagnostic cath next week. Discussed with pt and she is agreeable.   Kerin Ransom PA-C 01/31/2015 10:47 AM

## 2015-01-31 NOTE — Telephone Encounter (Signed)
Went to voicemail again- left another message

## 2015-02-03 ENCOUNTER — Encounter (HOSPITAL_COMMUNITY)
Admission: RE | Disposition: A | Discharge status: Home / Self Care | Payer: Self-pay | Source: Ambulatory Visit | Attending: Cardiology

## 2015-02-03 ENCOUNTER — Emergency Department (HOSPITAL_COMMUNITY)
Admission: EM | Admit: 2015-02-03 | Discharge: 2015-02-04 | Disposition: A | Discharge status: Home / Self Care | Payer: Medicaid Other | Attending: Emergency Medicine | Admitting: Emergency Medicine

## 2015-02-03 ENCOUNTER — Emergency Department (HOSPITAL_COMMUNITY): Payer: Medicaid Other

## 2015-02-03 ENCOUNTER — Ambulatory Visit (HOSPITAL_COMMUNITY)
Admission: RE | Admit: 2015-02-03 | Discharge: 2015-02-03 | Disposition: A | Discharge status: Home / Self Care | Payer: Medicaid Other | Source: Ambulatory Visit | Attending: Cardiology | Admitting: Cardiology

## 2015-02-03 ENCOUNTER — Encounter (HOSPITAL_COMMUNITY): Payer: Self-pay | Admitting: Vascular Surgery

## 2015-02-03 DIAGNOSIS — E039 Hypothyroidism, unspecified: Secondary | ICD-10-CM | POA: Insufficient documentation

## 2015-02-03 DIAGNOSIS — I129 Hypertensive chronic kidney disease with stage 1 through stage 4 chronic kidney disease, or unspecified chronic kidney disease: Secondary | ICD-10-CM | POA: Insufficient documentation

## 2015-02-03 DIAGNOSIS — I739 Peripheral vascular disease, unspecified: Secondary | ICD-10-CM | POA: Insufficient documentation

## 2015-02-03 DIAGNOSIS — R000 Tachycardia, unspecified: Secondary | ICD-10-CM | POA: Insufficient documentation

## 2015-02-03 DIAGNOSIS — M797 Fibromyalgia: Secondary | ICD-10-CM | POA: Diagnosis not present

## 2015-02-03 DIAGNOSIS — I10 Essential (primary) hypertension: Secondary | ICD-10-CM

## 2015-02-03 DIAGNOSIS — M62838 Other muscle spasm: Secondary | ICD-10-CM

## 2015-02-03 DIAGNOSIS — Z9889 Other specified postprocedural states: Secondary | ICD-10-CM | POA: Insufficient documentation

## 2015-02-03 DIAGNOSIS — R51 Headache: Secondary | ICD-10-CM | POA: Diagnosis present

## 2015-02-03 DIAGNOSIS — Z8673 Personal history of transient ischemic attack (TIA), and cerebral infarction without residual deficits: Secondary | ICD-10-CM | POA: Diagnosis not present

## 2015-02-03 DIAGNOSIS — Z8742 Personal history of other diseases of the female genital tract: Secondary | ICD-10-CM | POA: Insufficient documentation

## 2015-02-03 DIAGNOSIS — I2510 Atherosclerotic heart disease of native coronary artery without angina pectoris: Secondary | ICD-10-CM

## 2015-02-03 DIAGNOSIS — Z8719 Personal history of other diseases of the digestive system: Secondary | ICD-10-CM | POA: Diagnosis not present

## 2015-02-03 DIAGNOSIS — R519 Headache, unspecified: Secondary | ICD-10-CM

## 2015-02-03 DIAGNOSIS — N189 Chronic kidney disease, unspecified: Secondary | ICD-10-CM | POA: Insufficient documentation

## 2015-02-03 DIAGNOSIS — Z0389 Encounter for observation for other suspected diseases and conditions ruled out: Secondary | ICD-10-CM

## 2015-02-03 DIAGNOSIS — F419 Anxiety disorder, unspecified: Secondary | ICD-10-CM | POA: Insufficient documentation

## 2015-02-03 DIAGNOSIS — H9192 Unspecified hearing loss, left ear: Secondary | ICD-10-CM | POA: Diagnosis not present

## 2015-02-03 DIAGNOSIS — Z7982 Long term (current) use of aspirin: Secondary | ICD-10-CM | POA: Diagnosis not present

## 2015-02-03 DIAGNOSIS — R079 Chest pain, unspecified: Secondary | ICD-10-CM

## 2015-02-03 DIAGNOSIS — I773 Arterial fibromuscular dysplasia: Secondary | ICD-10-CM

## 2015-02-03 DIAGNOSIS — Z86018 Personal history of other benign neoplasm: Secondary | ICD-10-CM | POA: Insufficient documentation

## 2015-02-03 DIAGNOSIS — Z79899 Other long term (current) drug therapy: Secondary | ICD-10-CM | POA: Insufficient documentation

## 2015-02-03 DIAGNOSIS — R6883 Chills (without fever): Secondary | ICD-10-CM | POA: Diagnosis not present

## 2015-02-03 DIAGNOSIS — Z86010 Personal history of colonic polyps: Secondary | ICD-10-CM | POA: Insufficient documentation

## 2015-02-03 DIAGNOSIS — E785 Hyperlipidemia, unspecified: Secondary | ICD-10-CM | POA: Insufficient documentation

## 2015-02-03 DIAGNOSIS — F329 Major depressive disorder, single episode, unspecified: Secondary | ICD-10-CM | POA: Insufficient documentation

## 2015-02-03 DIAGNOSIS — IMO0001 Reserved for inherently not codable concepts without codable children: Secondary | ICD-10-CM

## 2015-02-03 HISTORY — PX: CARDIAC CATHETERIZATION: SHX172

## 2015-02-03 LAB — DIFFERENTIAL
BASOS ABS: 0 10*3/uL (ref 0.0–0.1)
Basophils Relative: 0 % (ref 0–1)
Eosinophils Absolute: 0 10*3/uL (ref 0.0–0.7)
Eosinophils Relative: 0 % (ref 0–5)
LYMPHS ABS: 0.9 10*3/uL (ref 0.7–4.0)
Lymphocytes Relative: 9 % — ABNORMAL LOW (ref 12–46)
MONOS PCT: 3 % (ref 3–12)
Monocytes Absolute: 0.3 10*3/uL (ref 0.1–1.0)
NEUTROS ABS: 9.6 10*3/uL — AB (ref 1.7–7.7)
Neutrophils Relative %: 88 % — ABNORMAL HIGH (ref 43–77)

## 2015-02-03 LAB — BASIC METABOLIC PANEL
Anion gap: 5 (ref 5–15)
BUN: 14 mg/dL (ref 6–20)
CO2: 27 mmol/L (ref 22–32)
Calcium: 9.7 mg/dL (ref 8.9–10.3)
Chloride: 109 mmol/L (ref 101–111)
Creatinine, Ser: 0.99 mg/dL (ref 0.44–1.00)
GFR calc Af Amer: >60 mL/min (ref >60)
GFR calc non Af Amer: >60 mL/min (ref >60)
Glucose, Bld: 110 mg/dL — ABNORMAL HIGH (ref 65–99)
Potassium: 4 mmol/L (ref 3.5–5.1)
Sodium: 141 mmol/L (ref 135–145)

## 2015-02-03 LAB — CBC
HCT: 39 % (ref 36.0–46.0)
HEMATOCRIT: 39.9 % (ref 36.0–46.0)
HEMOGLOBIN: 14 g/dL (ref 12.0–15.0)
Hemoglobin: 14 g/dL (ref 12.0–15.0)
MCH: 29.9 pg (ref 26.0–34.0)
MCH: 29.5 pg (ref 26.0–34.0)
MCHC: 35.9 g/dL (ref 30.0–36.0)
MCHC: 35.1 g/dL (ref 30.0–36.0)
MCV: 83.2 fL (ref 78.0–100.0)
MCV: 84.2 fL (ref 78.0–100.0)
Platelets: 253 10*3/uL (ref 150–400)
Platelets: 319 10*3/uL (ref 150–400)
RBC: 4.69 MIL/uL (ref 3.87–5.11)
RBC: 4.74 MIL/uL (ref 3.87–5.11)
RDW: 12.1 % (ref 11.5–15.5)
RDW: 12 % (ref 11.5–15.5)
WBC: 6.7 10*3/uL (ref 4.0–10.5)
WBC: 10.9 10*3/uL — AB (ref 4.0–10.5)

## 2015-02-03 LAB — APTT: APTT: 26 s (ref 24–37)

## 2015-02-03 LAB — I-STAT TROPONIN, ED: TROPONIN I, POC: 0.01 ng/mL (ref 0.00–0.08)

## 2015-02-03 LAB — I-STAT CHEM 8, ED
BUN: 11 mg/dL (ref 6–20)
CREATININE: 0.8 mg/dL (ref 0.44–1.00)
Calcium, Ion: 1.09 mmol/L — ABNORMAL LOW (ref 1.12–1.23)
Chloride: 103 mmol/L (ref 101–111)
GLUCOSE: 134 mg/dL — AB (ref 65–99)
HCT: 41 % (ref 36.0–46.0)
HEMOGLOBIN: 13.9 g/dL (ref 12.0–15.0)
POTASSIUM: 4.3 mmol/L (ref 3.5–5.1)
Sodium: 137 mmol/L (ref 135–145)
TCO2: 21 mmol/L (ref 0–100)

## 2015-02-03 LAB — COMPREHENSIVE METABOLIC PANEL
ALBUMIN: 4 g/dL (ref 3.5–5.0)
ALT: 22 U/L (ref 14–54)
AST: 32 U/L (ref 15–41)
Alkaline Phosphatase: 101 U/L (ref 38–126)
Anion gap: 12 (ref 5–15)
BILIRUBIN TOTAL: 0.6 mg/dL (ref 0.3–1.2)
BUN: 9 mg/dL (ref 6–20)
CHLORIDE: 104 mmol/L (ref 101–111)
CO2: 22 mmol/L (ref 22–32)
CREATININE: 0.96 mg/dL (ref 0.44–1.00)
Calcium: 9.5 mg/dL (ref 8.9–10.3)
GFR calc Af Amer: >60 mL/min (ref >60)
GLUCOSE: 140 mg/dL — AB (ref 65–99)
Potassium: 4.5 mmol/L (ref 3.5–5.1)
Sodium: 138 mmol/L (ref 135–145)
TOTAL PROTEIN: 6.9 g/dL (ref 6.5–8.1)

## 2015-02-03 LAB — PROTIME-INR
INR: 1.09 (ref 0.00–1.49)
INR: 1.09 (ref 0.00–1.49)
Prothrombin Time: 14.3 seconds (ref 11.6–15.2)
Prothrombin Time: 14.3 seconds (ref 11.6–15.2)

## 2015-02-03 SURGERY — LEFT HEART CATH AND CORONARY ANGIOGRAPHY
Anesthesia: LOCAL

## 2015-02-03 MED ORDER — SODIUM CHLORIDE 0.9 % IV SOLN: 250.0000 mL | INTRAVENOUS | Status: DC | PRN

## 2015-02-03 MED ORDER — LIDOCAINE HCL (PF) 1 % IJ SOLN
INTRAMUSCULAR | Status: AC
  Filled 2015-02-03: qty 30

## 2015-02-03 MED ORDER — SODIUM CHLORIDE 0.9 % IV SOLN
250.0000 mL | INTRAVENOUS | Status: DC | PRN
Start: 2015-02-03 — End: 2015-02-03

## 2015-02-03 MED ORDER — MIDAZOLAM HCL 2 MG/2ML IJ SOLN
INTRAMUSCULAR | Status: AC
  Filled 2015-02-03: qty 4

## 2015-02-03 MED ORDER — NITROGLYCERIN 1 MG/10 ML FOR IR/CATH LAB
INTRA_ARTERIAL | Status: DC | PRN
  Administered 2015-02-03: 200 ug via INTRA_ARTERIAL
  Administered 2015-02-03: 200 ug via INTRACORONARY
  Administered 2015-02-03: 200 ug via INTRA_ARTERIAL

## 2015-02-03 MED ORDER — MIDAZOLAM HCL 2 MG/2ML IJ SOLN
INTRAMUSCULAR | Status: DC | PRN
  Administered 2015-02-03 (×5): 1 mg via INTRAVENOUS

## 2015-02-03 MED ORDER — SODIUM CHLORIDE 0.9 % IJ SOLN: 3.0000 mL | INTRAMUSCULAR | Status: DC | PRN

## 2015-02-03 MED ORDER — ACETAMINOPHEN 325 MG PO TABS: 650.0000 mg | ORAL_TABLET | ORAL | Status: DC | PRN

## 2015-02-03 MED ORDER — HEPARIN SODIUM (PORCINE) 1000 UNIT/ML IJ SOLN
INTRAMUSCULAR | Status: AC
  Filled 2015-02-03: qty 1

## 2015-02-03 MED ORDER — HEPARIN (PORCINE) IN NACL 2-0.9 UNIT/ML-% IJ SOLN
INTRAMUSCULAR | Status: AC
  Filled 2015-02-03: qty 1000

## 2015-02-03 MED ORDER — SODIUM CHLORIDE 0.9 % WEIGHT BASED INFUSION: 3.0000 mL/kg/h | INTRAVENOUS | Status: DC

## 2015-02-03 MED ORDER — DIPHENHYDRAMINE HCL 50 MG/ML IJ SOLN
25.0000 mg | Freq: Once | INTRAMUSCULAR | Status: AC
  Administered 2015-02-03: 25 mg via INTRAVENOUS
  Filled 2015-02-03: qty 1

## 2015-02-03 MED ORDER — LIDOCAINE HCL (PF) 1 % IJ SOLN
INTRAMUSCULAR | Status: DC | PRN
  Administered 2015-02-03: 14:00:00

## 2015-02-03 MED ORDER — SODIUM CHLORIDE 0.9 % IV SOLN
1000.0000 mL | INTRAVENOUS | Status: DC
  Administered 2015-02-04: 1000 mL via INTRAVENOUS

## 2015-02-03 MED ORDER — ASPIRIN 81 MG PO CHEW: 81.0000 mg | CHEWABLE_TABLET | ORAL | Status: DC

## 2015-02-03 MED ORDER — NITROGLYCERIN 1 MG/10 ML FOR IR/CATH LAB
INTRA_ARTERIAL | Status: AC
  Filled 2015-02-03: qty 10

## 2015-02-03 MED ORDER — SODIUM CHLORIDE 0.9 % WEIGHT BASED INFUSION
3.0000 mL/kg/h | INTRAVENOUS | Status: AC
Start: 2015-02-03 — End: 2015-02-03
  Administered 2015-02-03: 3 mL/kg/h via INTRAVENOUS

## 2015-02-03 MED ORDER — METOCLOPRAMIDE HCL 5 MG/ML IJ SOLN
10.0000 mg | Freq: Once | INTRAMUSCULAR | Status: AC
  Administered 2015-02-03: 10 mg via INTRAVENOUS
  Filled 2015-02-03: qty 2

## 2015-02-03 MED ORDER — VERAPAMIL HCL 2.5 MG/ML IV SOLN
INTRAVENOUS | Status: DC | PRN
  Administered 2015-02-03 (×2): via INTRA_ARTERIAL

## 2015-02-03 MED ORDER — SODIUM CHLORIDE 0.9 % IJ SOLN: 3.0000 mL | Freq: Two times a day (BID) | INTRAMUSCULAR | Status: DC

## 2015-02-03 MED ORDER — HEPARIN SODIUM (PORCINE) 1000 UNIT/ML IJ SOLN
INTRAMUSCULAR | Status: DC | PRN
  Administered 2015-02-03: 3000 [IU] via INTRAVENOUS

## 2015-02-03 MED ORDER — SODIUM CHLORIDE 0.9 % IJ SOLN
3.0000 mL | INTRAMUSCULAR | Status: DC | PRN
Start: 2015-02-03 — End: 2015-02-03

## 2015-02-03 MED ORDER — FENTANYL CITRATE (PF) 100 MCG/2ML IJ SOLN
INTRAMUSCULAR | Status: AC
  Filled 2015-02-03: qty 4

## 2015-02-03 MED ORDER — SODIUM CHLORIDE 0.9 % WEIGHT BASED INFUSION: 1.0000 mL/kg/h | INTRAVENOUS | Status: DC

## 2015-02-03 MED ORDER — SODIUM CHLORIDE 0.9 % IV SOLN
1000.0000 mL | Freq: Once | INTRAVENOUS | Status: AC
  Administered 2015-02-03: 1000 mL via INTRAVENOUS

## 2015-02-03 MED ORDER — KETOROLAC TROMETHAMINE 30 MG/ML IJ SOLN
30.0000 mg | Freq: Once | INTRAMUSCULAR | Status: AC
  Administered 2015-02-03: 30 mg via INTRAVENOUS
  Filled 2015-02-03: qty 1

## 2015-02-03 MED ORDER — FENTANYL CITRATE (PF) 100 MCG/2ML IJ SOLN
INTRAMUSCULAR | Status: DC | PRN
  Administered 2015-02-03 (×5): 25 ug via INTRAVENOUS

## 2015-02-03 MED ORDER — ONDANSETRON HCL 4 MG/2ML IJ SOLN: 4.0000 mg | Freq: Four times a day (QID) | INTRAMUSCULAR | Status: DC | PRN

## 2015-02-03 MED ORDER — VERAPAMIL HCL 2.5 MG/ML IV SOLN
INTRAVENOUS | Status: AC
  Filled 2015-02-03: qty 2

## 2015-02-03 SURGICAL SUPPLY — 14 items
CATH INFINITI 4FR JL3.5 (CATHETERS) ×2 IMPLANT
CATH INFINITI 5 FR JL3.5 (CATHETERS) ×2 IMPLANT
CATH INFINITI JR4 5F (CATHETERS) ×2 IMPLANT
COVER PRB 48X5XTLSCP FOLD TPE (BAG) ×1 IMPLANT
COVER PROBE 5X48 (BAG) ×1
DEVICE RAD COMP TR BAND LRG (VASCULAR PRODUCTS) ×2 IMPLANT
GLIDESHEATH SLEND SS 6F .021 (SHEATH) ×2 IMPLANT
KIT HEART LEFT (KITS) ×2 IMPLANT
PACK CARDIAC CATHETERIZATION (CUSTOM PROCEDURE TRAY) ×2 IMPLANT
SHEATH FAST CATH BRACH 5F 5CM (SHEATH) IMPLANT
TRANSDUCER W/STOPCOCK (MISCELLANEOUS) ×2 IMPLANT
TUBING CIL FLEX 10 FLL-RA (TUBING) ×2 IMPLANT
WIRE HI TORQ VERSACORE-J 145CM (WIRE) ×2 IMPLANT
WIRE SAFE-T 1.5MM-J .035X260CM (WIRE) ×2 IMPLANT

## 2015-02-03 NOTE — ED Notes (Signed)
Spoke with Dr. Rex Kras about pt. She requests a non-contrasted head CT be performed.

## 2015-02-03 NOTE — ED Notes (Signed)
Pt reports to the ED for eval of HA and numbness in her left hand. She reports she was here today and had a cardiac cath performed which showed only a 50% blockage so she could not get a stent placed because insurance requires it to be 70% or more. She also has associated nausea and vomiting. Light and sound do not make the pain worse. Pt also reports the pain extends into her neck. Grips equal, smile symmetric, and no arm drift. Pt A&OX4, resp e/u, and skin warm and dry.

## 2015-02-03 NOTE — Discharge Instructions (Signed)
Radial Site Care °Refer to this sheet in the next few weeks. These instructions provide you with information on caring for yourself after your procedure. Your caregiver may also give you more specific instructions. Your treatment has been planned according to current medical practices, but problems sometimes occur. Call your caregiver if you have any problems or questions after your procedure. °HOME CARE INSTRUCTIONS °· You may shower the day after the procedure. Remove the bandage (dressing) and gently wash the site with plain soap and water. Gently pat the site dry. °· Do not apply powder or lotion to the site. °· Do not submerge the affected site in water for 3 to 5 days. °· Inspect the site at least twice daily. °· Do not flex or bend the affected arm for 24 hours. °· No lifting over 5 pounds (2.3 kg) for 5 days after your procedure. °· Do not drive home if you are discharged the same day of the procedure. Have someone else drive you. °· You may drive 24 hours after the procedure unless otherwise instructed by your caregiver. °· Do not operate machinery or power tools for 24 hours. °· A responsible adult should be with you for the first 24 hours after you arrive home. °What to expect: °· Any bruising will usually fade within 1 to 2 weeks. °· Blood that collects in the tissue (hematoma) may be painful to the touch. It should usually decrease in size and tenderness within 1 to 2 weeks. °SEEK IMMEDIATE MEDICAL CARE IF: °· You have unusual pain at the radial site. °· You have redness, warmth, swelling, or pain at the radial site. °· You have drainage (other than a small amount of blood on the dressing). °· You have chills. °· You have a fever or persistent symptoms for more than 72 hours. °· You have a fever and your symptoms suddenly get worse. °· Your arm becomes pale, cool, tingly, or numb. °· You have heavy bleeding from the site. Hold pressure on the site. °Document Released: 06/26/2010 Document Revised:  08/16/2011 Document Reviewed: 06/26/2010 °ExitCare® Patient Information ©2015 [employer]. This information is not intended to replace advice given to you by your health care provider. Make sure you discuss any questions you have with your health care provider. ° °

## 2015-02-03 NOTE — ED Notes (Signed)
Pt transported to CT ?

## 2015-02-03 NOTE — ED Provider Notes (Signed)
CSN: [id number]     Arrival date & time 02/03/15  2217 History  This chart was scribed for Delora Fuel, MD by Randa Evens, ED Scribe. This patient was seen in room A11C/A11C and the patient's care was started at 11:10 PM.     Chief Complaint  Patient presents with  . Headache   The history is provided by the patient. No language interpreter was used.   HPI Comments: [name] Rowe is a 54 y.o. female with PMhx listed below who presents to the Emergency Department complaining of left sided HA onset today around 6 PM. Pt states that the pain is radiating down the left side of her neck as well. Pt has associated nausea, vomiting and chills. She doesn't report any alleviating or worsening factors. Pt reports that earlier today she had a cardiac cath that only showed a 50% block so she was not able to receive a stent today. Pt doesn't report any other symptoms.   Past Medical History  Diagnosis Date  . Migraine headache   . Chest pain     a. No angiographic CAD by cath 2009 in [place]. There was catheter-induced RCA vasospasm versus microvascular obstruction. bCarlton Adam myoview 04/2010 - EF 79%, normal perfusion.;  c. ETT-Myoview 7/13: no ischemia, EF 77%, submax exercise  . Renal artery stenosis     a. Due to fibromuscular dysplasia. b. Renal stent 2009. c. Studies:  CTA abdomen (1/13) with evidence for fibromuscular dysplasia, right renal artery stent appears patent. Renal artery dopplers (2/13) with FMD but no evidence for signficant stenosis.   . Hypothyroidism 2003    Graves disease s/p radio-iodine treatment in 2003   . Fibromuscular dysplasia 2009    a. Renal artery stenosis due to this. b. Carotid dopplers 07/2011 c/w FMD but no significant stenosis  . Tachycardia 2000    10/27/10-Holter -NSR with occ sinus tachy-rare PACs, no sig arrhythmia  . Hx of echocardiogram     a. Echo (1/13): EF 17-40%, grade 1 diastolic dysfunction  . HTN (hypertension) 1988    a. Likely related to  RAS; urinary metanephrines and plasma renin/aldosterone ratio normal. b. H/o HTN urgency 06/2011 after being off BP meds for a number of days  . HLD (hyperlipidemia) 2000  . Myocardial infarction 2009  . Anxiety 2001  . Depression 2001  . Hypertensive crisis 06/29/2011  . Leiomyoma   . Adenomyosis   . Fibromyalgia 1999  . Coronary vasospasm   . Chronic back pain     had 3 epidural injections on 02-14-13  . Hearing loss of left ear     s/p CVA 2006  . Personal history of colonic polyps-sessile serrated adenoma 02/23/2013  . CVA (cerebral vascular accident) 2006    lost hearing on left  . Irritable bowel syndrome   . Complication of anesthesia     has difficulty voiding after surgery  . H/O Graves' disease 2003    s/p radio-iodine treatment in 2003    Past Surgical History  Procedure Laterality Date  . Renal artery stent  2009  . Spine surgery    . Back surgery  2010 and 2011    In CT.  11/19/2008 (possibly a laminectomy) and in May of 2011 she underwent a L2-L3 fusion.  . Robotic assisted lap vaginal hysterectomy  05/13/2008    Gunter hysterectomy  for myomatous uterus   . Colonoscopy    . Colonoscopy    . Cardiac catheterization  2009  states it was normal  . Vaginal hysterectomy    . Knee arthroscopy Left   . Hip surgery Bilateral 1988 and 1989    "scraped head of femur"  . Lumbar laminectomy/decompression microdiscectomy Left 05/25/2013    Procedure: LUMBAR LAMINECTOMY/DECOMPRESSION MICRODISCECTOMY 1 LEVEL;  Surgeon: Winfield Cunas, MD;  Location: [facility];  Service: Neurosurgery;  Laterality: Left;  LEFT L5S1 microdiskectomy  . Cardiac cath     Family History  Problem Relation Age of Onset  . Heart attack    . Stroke    . Hypertension Mother   . Asthma Mother   . Hyperlipidemia Mother   . Stroke Mother   . Hypertension Father   . Prostate cancer Father   . Heart attack Father   . Hyperlipidemia Father   . Stroke Father   . Hypertension Brother   .  Heart attack Brother 7  . Early death Brother 8    massive stroke   . Hyperlipidemia Brother   . Metabolic syndrome Sister   . Depression Sister   . Hypertension Sister   . Diabetes Maternal Aunt   . Colon cancer Neg Hx   . Stomach cancer Neg Hx    Social History  Substance Use Topics  . Smoking status: Never Smoker   . Smokeless tobacco: Never Used  . Alcohol Use: Yes     Comment: very rarely   OB History    No data available     Review of Systems  Constitutional: Positive for chills.  Neurological: Positive for headaches.  All other systems reviewed and are negative.    Allergies  Hydromorphone hcl; Prochlorperazine edisylate; Sumatriptan; Atorvastatin; and Tramadol  Home Medications   Prior to Admission medications   Medication Sig Start Date End Date Taking? Authorizing Provider  aspirin EC 81 MG tablet Take 81 mg by mouth daily.   Yes Historical Provider, MD  hydrOXYzine (ATARAX/VISTARIL) 10 MG tablet Take 1 tablet (10 mg total) by mouth at bedtime as needed for itching. 08/15/14  Yes Patrecia Pour, MD  labetalol (NORMODYNE) 200 MG tablet Take 4 tablets (800 mg total) by mouth 2 (two) times daily. 06/24/14  Yes Larey Dresser, MD  levothyroxine (SYNTHROID, LEVOTHROID) 200 MCG tablet Take 200 mcg by mouth daily before breakfast.   Yes Historical Provider, MD  Linaclotide (LINZESS) 290 MCG CAPS capsule Take 1 capsule (290 mcg total) by mouth daily. 08/26/14  Yes Coral Spikes, DO  meclizine (ANTIVERT) 25 MG tablet Take 1 tablet (25 mg total) by mouth 3 (three) times daily as needed for dizziness. 09/28/13  Yes John Molpus, MD  NIFEdipine (PROCARDIA XL/ADALAT-CC) 90 MG 24 hr tablet Take 1 tablet (90 mg total) by mouth daily. 06/24/14  Yes Larey Dresser, MD  nitroGLYCERIN (NITROSTAT) 0.4 MG SL tablet Place 1 tablet (0.4 mg total) under the tongue every 5 (five) minutes as needed. x3 doses as needed for chest pain 04/03/13  Yes Larey Dresser, MD  Nutritional Supplements  (IMMUNOCAL PO) Take 30 g by mouth every morning. Mix with Orange Juice.   Yes Historical Provider, MD  OVER THE COUNTER MEDICATION Take 2 scoop by mouth daily. K21 Protein Powder   Yes Historical Provider, MD  Probiotic Product (PROBIOTIC PO) Take 1 capsule by mouth daily.   Yes Historical Provider, MD  QUEtiapine (SEROQUEL) 100 MG tablet Take 100 mg by mouth at bedtime. Per psychiatrist.   Yes Historical Provider, MD  rosuvastatin (CRESTOR) 40 MG tablet Take 40  mg by mouth daily.   Yes Historical Provider, MD  sertraline (ZOLOFT) 50 MG tablet Take 50 mg by mouth daily. Per psychiatrist   Yes Historical Provider, MD  levothyroxine (SYNTHROID, LEVOTHROID) 150 MCG tablet TAKE ONE TABLET BY MOUTH DAILY BEFORE BREAKFAST Patient not taking: Reported on 02/03/2015 11/05/14   Patrecia Pour, MD   BP 111/72 mmHg  Pulse 110  Temp(Src) 97.9 F (36.6 C) (Oral)  Resp 15  SpO2 97%   Physical Exam  Constitutional: She is oriented to person, place, and time. She appears well-developed and well-nourished. No distress.  Appears uncomfortable, shivering.   HENT:  Head: Normocephalic and atraumatic.  Eyes: Conjunctivae and EOM are normal.  Neck: Neck supple. No tracheal deviation present.  Cardiovascular: Tachycardia present.   Pulmonary/Chest: Effort normal. No respiratory distress.  Musculoskeletal: Normal range of motion.  Neurological: She is alert and oriented to person, place, and time.  Skin: Skin is warm and dry.  Psychiatric: She has a normal mood and affect. Her behavior is normal.  Nursing note and vitals reviewed.   ED Course  Procedures (including critical care time) DIAGNOSTIC STUDIES: Oxygen Saturation is 100% on RA, normal by my interpretation.    COORDINATION OF CARE: 11:22 PM-Discussed treatment plan with pt at bedside and pt agreed to plan.     Labs Review Results for orders placed or performed during the hospital encounter of 02/03/15  Protime-INR  Result Value Ref Range    Prothrombin Time 14.3 11.6 - 15.2 seconds   INR 1.09 0.00 - 1.49  APTT  Result Value Ref Range   aPTT 26 24 - 37 seconds  CBC  Result Value Ref Range   WBC 10.9 (H) 4.0 - 10.5 K/uL   RBC 4.74 3.87 - 5.11 MIL/uL   Hemoglobin 14.0 12.0 - 15.0 g/dL   HCT 39.9 36.0 - 46.0 %   MCV 84.2 78.0 - 100.0 fL   MCH 29.5 26.0 - 34.0 pg   MCHC 35.1 30.0 - 36.0 g/dL   RDW 12.0 11.5 - 15.5 %   Platelets 319 150 - 400 K/uL  Differential  Result Value Ref Range   Neutrophils Relative % 88 (H) 43 - 77 %   Neutro Abs 9.6 (H) 1.7 - 7.7 K/uL   Lymphocytes Relative 9 (L) 12 - 46 %   Lymphs Abs 0.9 0.7 - 4.0 K/uL   Monocytes Relative 3 3 - 12 %   Monocytes Absolute 0.3 0.1 - 1.0 K/uL   Eosinophils Relative 0 0 - 5 %   Eosinophils Absolute 0.0 0.0 - 0.7 K/uL   Basophils Relative 0 0 - 1 %   Basophils Absolute 0.0 0.0 - 0.1 K/uL  Comprehensive metabolic panel  Result Value Ref Range   Sodium 138 135 - 145 mmol/L   Potassium 4.5 3.5 - 5.1 mmol/L   Chloride 104 101 - 111 mmol/L   CO2 22 22 - 32 mmol/L   Glucose, Bld 140 (H) 65 - 99 mg/dL   BUN 9 6 - 20 mg/dL   Creatinine, Ser 0.96 0.44 - 1.00 mg/dL   Calcium 9.5 8.9 - 10.3 mg/dL   Total Protein 6.9 6.5 - 8.1 g/dL   Albumin 4.0 3.5 - 5.0 g/dL   AST 32 15 - 41 U/L   ALT 22 14 - 54 U/L   Alkaline Phosphatase 101 38 - 126 U/L   Total Bilirubin 0.6 0.3 - 1.2 mg/dL   GFR calc non Af Amer >60 >60 mL/min  GFR calc Af Amer >60 >60 mL/min   Anion gap 12 5 - 15  I-stat troponin, ED (not at [facility], [facility])  Result Value Ref Range   Troponin i, poc 0.01 0.00 - 0.08 ng/mL   Comment 3          I-Stat Chem 8, ED  (not at [facility], [facility])  Result Value Ref Range   Sodium 137 135 - 145 mmol/L   Potassium 4.3 3.5 - 5.1 mmol/L   Chloride 103 101 - 111 mmol/L   BUN 11 6 - 20 mg/dL   Creatinine, Ser 0.80 0.44 - 1.00 mg/dL   Glucose, Bld 134 (H) 65 - 99 mg/dL   Calcium, Ion 1.09 (L) 1.12 - 1.23 mmol/L   TCO2 21 0 - 100 mmol/L   Hemoglobin 13.9 12.0 - 15.0 g/dL   HCT  41.0 36.0 - 46.0 %    Imaging Review Ct Head Wo Contrast  02/03/2015   CLINICAL DATA:  Acute onset of headache and left hand numbness. Nausea and vomiting. Initial encounter.  EXAM: CT HEAD WITHOUT CONTRAST  TECHNIQUE: Contiguous axial images were obtained from the base of the skull through the vertex without intravenous contrast.  COMPARISON:  CT of the head performed 04/08/2011, and MRI of the brain performed 04/15/2014  FINDINGS: There is no evidence of acute infarction, mass lesion, or intra- or extra-axial hemorrhage on CT.  A chronic lacunar infarct is noted at the white matter of the right frontal lobe.  The posterior fossa, including the cerebellum, brainstem and fourth ventricle, is within normal limits. The third and lateral ventricles, and basal ganglia are unremarkable in appearance. The cerebral hemispheres demonstrate grossly normal gray-white differentiation. No mass effect or midline shift is seen.  There is no evidence of fracture; visualized osseous structures are unremarkable in appearance. The orbits are within normal limits. There is mild partial opacification of the mastoid air cells bilaterally. The paranasal sinuses are well-aerated. No significant soft tissue abnormalities are seen.  IMPRESSION: 1. No acute intracranial pathology seen on CT. 2. Small chronic lacunar infarct at the white matter of the right frontal lobe. 3. Mild partial opacification of the mastoid air cells bilaterally.   Electronically Signed   By: Garald Balding M.D.   On: 02/03/2015 23:38   I have personally reviewed and evaluated these images and lab results as part of my medical decision-making.   MDM   Final diagnoses:  Headache, unspecified headache type  Cervical paraspinal muscle spasm      Headache which is brown likely to be related in any way to her cardiac catheterization earlier today. Many aspects seem consistent with migraine and she does have history of migraine in the past. She will be  given a trial of a migraine cocktail of diphenhydramine, normal saline, metoclopramide, and ketorolac.   There is moderate improvement in her headache with above noted treatment. She will be given a dose of morphine.  She notes further improvement. On reexam, she has significant tenderness and spasm of the paracervical muscles. She is given IV methocarbamol with excellent relief of pain. At this point, I feel her headache was most likely related to paracervical muscle spasm. She is discharged with prescriptions for metoclopramide, orphenadrine, and oxycodone-acetaminophen.  I personally performed the services described in this documentation, which was scribed in my presence. The recorded information has been reviewed and is accurate.       Delora Fuel, MD 11/57/26 2035

## 2015-02-03 NOTE — ED Notes (Signed)
MD at bedside. 

## 2015-02-03 NOTE — ED Notes (Signed)
Pt back from CT.  Pt vomiting and diaphoretic.

## 2015-02-04 ENCOUNTER — Telehealth: Payer: Self-pay | Admitting: Cardiology

## 2015-02-04 ENCOUNTER — Encounter (HOSPITAL_COMMUNITY): Payer: Self-pay | Admitting: Cardiology

## 2015-02-04 MED ORDER — METOCLOPRAMIDE HCL 10 MG PO TABS: 10.0000 mg | ORAL_TABLET | Freq: Four times a day (QID) | ORAL | Status: DC | PRN

## 2015-02-04 MED ORDER — MORPHINE SULFATE (PF) 4 MG/ML IV SOLN
4.0000 mg | Freq: Once | INTRAVENOUS | Status: AC
  Administered 2015-02-04: 4 mg via INTRAVENOUS
  Filled 2015-02-04: qty 1

## 2015-02-04 MED ORDER — ORPHENADRINE CITRATE ER 100 MG PO TB12: 100.0000 mg | ORAL_TABLET | Freq: Two times a day (BID) | ORAL | Status: DC

## 2015-02-04 MED ORDER — METHOCARBAMOL 1000 MG/10ML IJ SOLN
1000.0000 mg | Freq: Once | INTRAVENOUS | Status: AC
  Administered 2015-02-04: 1000 mg via INTRAVENOUS
  Filled 2015-02-04: qty 10

## 2015-02-04 MED ORDER — OXYCODONE-ACETAMINOPHEN 5-325 MG PO TABS: 1.0000 | ORAL_TABLET | ORAL | Status: DC | PRN

## 2015-02-04 NOTE — Discharge Instructions (Signed)
General Headache Without Cause A headache is pain or discomfort felt around the head or neck area. The specific cause of a headache may not be found. There are many causes and types of headaches. A few common ones are:  Tension headaches.  Migraine headaches.  Cluster headaches.  Chronic daily headaches. HOME CARE INSTRUCTIONS   Keep all follow-up appointments with your caregiver or any specialist referral.  Only take over-the-counter or prescription medicines for pain or discomfort as directed by your caregiver.  Lie down in a dark, quiet room when you have a headache.  Keep a headache journal to find out what may trigger your migraine headaches. For example, write down:  What you eat and drink.  How much sleep you get.  Any change to your diet or medicines.  Try massage or other relaxation techniques.  Put ice packs or heat on the head and neck. Use these 3 to 4 times per day for 15 to 20 minutes each time, or as needed.  Limit stress.  Sit up straight, and do not tense your muscles.  Quit smoking if you smoke.  Limit alcohol use.  Decrease the amount of caffeine you drink, or stop drinking caffeine.  Eat and sleep on a regular schedule.  Get 7 to 9 hours of sleep, or as recommended by your caregiver.  Keep lights dim if bright lights bother you and make your headaches worse. SEEK MEDICAL CARE IF:   You have problems with the medicines you were prescribed.  Your medicines are not working.  You have a change from the usual headache.  You have nausea or vomiting. SEEK IMMEDIATE MEDICAL CARE IF:   Your headache becomes severe.  You have a fever.  You have a stiff neck.  You have loss of vision.  You have muscular weakness or loss of muscle control.  You start losing your balance or have trouble walking.  You feel faint or pass out.  You have severe symptoms that are different from your first symptoms. MAKE SURE YOU:   Understand these  instructions.  Will watch your condition.  Will get help right away if you are not doing well or get worse. Document Released: 05/24/2005 Document Revised: 08/16/2011 Document Reviewed: 06/09/2011 [facility] Patient Information 2015 [place], [place]. This information is not intended to replace advice given to you by your health care provider. Make sure you discuss any questions you have with your health care provider.  Torticollis, Acute You have suddenly (acutely) developed a twisted neck (torticollis). This is usually a self-limited condition. CAUSES  Acute torticollis may be caused by malposition, trauma or infection. Most commonly, acute torticollis is caused by sleeping in an awkward position. Torticollis may also be caused by the flexion, extension or twisting of the neck muscles beyond their normal position. Sometimes, the exact cause may not be known. SYMPTOMS  Usually, there is pain and limited movement of the neck. Your neck may twist to one side. DIAGNOSIS  The diagnosis is often made by physical examination. X-rays, CT scans or MRIs may be done if there is a history of trauma or concern of infection. TREATMENT  For a common, stiff neck that develops during sleep, treatment is focused on relaxing the contracted neck muscle. Medications (including shots) may be used to treat the problem. Most cases resolve in several days. Torticollis usually responds to conservative physical therapy. If left untreated, the shortened and spastic neck muscle can cause deformities in the face and neck. Rarely, surgery is required.  HOME CARE INSTRUCTIONS   Use over-the-counter and prescription medications as directed by your caregiver.  Do stretching exercises and massage the neck as directed by your caregiver.  Follow up with physical therapy if needed and as directed by your caregiver. SEEK IMMEDIATE MEDICAL CARE IF:   You develop difficulty breathing or noisy breathing (stridor).  You drool,  develop trouble swallowing or have pain with swallowing.  You develop numbness or weakness in the hands or feet.  You have changes in speech or vision.  You have problems with urination or bowel movements.  You have difficulty walking.  You have a fever.  You have increased pain. MAKE SURE YOU:   Understand these instructions.  Will watch your condition.  Will get help right away if you are not doing well or get worse. Document Released: 05/21/2000 Document Revised: 08/16/2011 Document Reviewed: 07/02/2009 [facility] Patient Information 2015 [place], [place]. This information is not intended to replace advice given to you by your health care provider. Make sure you discuss any questions you have with your health care provider.  Metoclopramide tablets What is this medicine? METOCLOPRAMIDE (met oh kloe PRA mide) is used to treat the symptoms of gastroesophageal reflux disease (GERD) like heartburn. It is also used to treat people with slow emptying of the stomach and intestinal tract. This medicine may be used for other purposes; ask your health care provider or pharmacist if you have questions. COMMON BRAND NAME(S): Reglan What should I tell my health care provider before I take this medicine? They need to know if you have any of these conditions: -breast cancer -depression -diabetes -heart failure -high blood pressure -kidney disease -liver disease -Parkinson's disease or a movement disorder -pheochromocytoma -seizures -stomach obstruction, bleeding, or perforation -an unusual or allergic reaction to metoclopramide, procainamide, sulfites, other medicines, foods, dyes, or preservatives -pregnant or trying to get pregnant -breast-feeding How should I use this medicine? Take this medicine by mouth with a glass of water. Follow the directions on the prescription label. Take this medicine on an empty stomach, about 30 minutes before eating. Take your doses at regular intervals.  Do not take your medicine more often than directed. Do not stop taking except on the advice of your doctor or health care professional. A special MedGuide will be given to you by the pharmacist with each prescription and refill. Be sure to read this information carefully each time. Talk to your pediatrician regarding the use of this medicine in children. Special care may be needed. Overdosage: If you think you have taken too much of this medicine contact a poison control center or emergency room at once. NOTE: This medicine is only for you. Do not share this medicine with others. What if I miss a dose? If you miss a dose, take it as soon as you can. If it is almost time for your next dose, take only that dose. Do not take double or extra doses. What may interact with this medicine? -acetaminophen -cyclosporine -digoxin -medicines for blood pressure -medicines for diabetes, including insulin -medicines for hay fever and other allergies -medicines for depression, especially an Monoamine Oxidase Inhibitor (MAOI) -medicines for Parkinson's disease, like levodopa -medicines for sleep or for pain -tetracycline This list may not describe all possible interactions. Give your health care provider a list of all the medicines, herbs, non-prescription drugs, or dietary supplements you use. Also tell them if you smoke, drink alcohol, or use illegal drugs. Some items may interact with your medicine. What should I watch  for while using this medicine? It may take a few weeks for your stomach condition to start to get better. However, do not take this medicine for longer than 12 weeks. The longer you take this medicine, and the more you take it, the greater your chances are of developing serious side effects. If you are an elderly patient, a female patient, or you have diabetes, you may be at an increased risk for side effects from this medicine. Contact your doctor immediately if you start having movements you  cannot control such as lip smacking, rapid movements of the tongue, involuntary or uncontrollable movements of the eyes, head, arms and legs, or muscle twitches and spasms. Patients and their families should watch out for worsening depression or thoughts of suicide. Also watch out for any sudden or severe changes in feelings such as feeling anxious, agitated, panicky, irritable, hostile, aggressive, impulsive, severely restless, overly excited and hyperactive, or not being able to sleep. If this happens, especially at the beginning of treatment or after a change in dose, call your doctor. Do not treat yourself for high fever. Ask your doctor or health care professional for advice. You may get drowsy or dizzy. Do not drive, use machinery, or do anything that needs mental alertness until you know how this drug affects you. Do not stand or sit up quickly, especially if you are an older patient. This reduces the risk of dizzy or fainting spells. Alcohol can make you more drowsy and dizzy. Avoid alcoholic drinks. What side effects may I notice from receiving this medicine? Side effects that you should report to your doctor or health care professional as soon as possible: -allergic reactions like skin rash, itching or hives, swelling of the face, lips, or tongue -abnormal production of milk in females -breast enlargement in both males and females -change in the way you walk -difficulty moving, speaking or swallowing -drooling, lip smacking, or rapid movements of the tongue -excessive sweating -fever -involuntary or uncontrollable movements of the eyes, head, arms and legs -irregular heartbeat or palpitations -muscle twitches and spasms -unusually weak or tired Side effects that usually do not require medical attention (report to your doctor or health care professional if they continue or are bothersome): -change in sex drive or performance -depressed mood -diarrhea -difficulty  sleeping -headache -menstrual changes -restless or nervous This list may not describe all possible side effects. Call your doctor for medical advice about side effects. You may report side effects to FDA at 1-800-FDA-1088. Where should I keep my medicine? Keep out of the reach of children. Store at room temperature between 20 and 25 degrees C (68 and 77 degrees F). Protect from light. Keep container tightly closed. Throw away any unused medicine after the expiration date. NOTE: This sheet is a summary. It may not cover all possible information. If you have questions about this medicine, talk to your doctor, pharmacist, or health care provider.  2015, Elsevier/[employer] Standard. (2011-09-21 13:04:38)  Orphenadrine tablets What is this medicine? ORPHENADRINE (or FEN a dreen) helps to relieve pain and stiffness in muscles and can treat muscle spasms. This medicine may be used for other purposes; ask your health care provider or pharmacist if you have questions. COMMON BRAND NAME(S): Norflex What should I tell my health care provider before I take this medicine? They need to know if you have any of these conditions: -glaucoma -heart disease -kidney disease -myasthenia gravis -peptic ulcer disease -prostate disease -stomach problems -an unusual or allergic reaction to orphenadrine, other  medicines, foods, lactose, dyes, or preservatives -pregnant or trying to get pregnant -breast-feeding How should I use this medicine? Take this medicine by mouth with a full glass of water. Follow the directions on the prescription label. Take your medicine at regular intervals. Do not take your medicine more often than directed. Do not take more than you are told to take. Talk to your pediatrician regarding the use of this medicine in children. Special care may be needed. Patients over 40 years old may have a stronger reaction and need a smaller dose. Overdosage: If you think you have taken too much of this  medicine contact a poison control center or emergency room at once. NOTE: This medicine is only for you. Do not share this medicine with others. What if I miss a dose? If you miss a dose, take it as soon as you can. If it is almost time for your next dose, take only that dose. Do not take double or extra doses. What may interact with this medicine? -alcohol -antihistamines -barbiturates, like phenobarbital -benzodiazepines -cyclobenzaprine -medicines for pain -phenothiazines like chlorpromazine, mesoridazine, prochlorperazine, thioridazine This list may not describe all possible interactions. Give your health care provider a list of all the medicines, herbs, non-prescription drugs, or dietary supplements you use. Also tell them if you smoke, drink alcohol, or use illegal drugs. Some items may interact with your medicine. What should I watch for while using this medicine? Your mouth may get dry. Chewing sugarless gum or sucking hard candy, and drinking plenty of water may help. Contact your doctor if the problem does not go away or is severe. This medicine may cause dry eyes and blurred vision. If you wear contact lenses you may feel some discomfort. Lubricating drops may help. See your eye doctor if the problem does not go away or is severe. You may get drowsy or dizzy. Do not drive, use machinery, or do anything that needs mental alertness until you know how this medicine affects you. Do not stand or sit up quickly, especially if you are an older patient. This reduces the risk of dizzy or fainting spells. Alcohol may interfere with the effect of this medicine. Avoid alcoholic drinks. What side effects may I notice from receiving this medicine? Side effects that you should report to your doctor or health care professional as soon as possible: -allergic reactions like skin rash, itching or hives, swelling of the face, lips, or tongue -changes in vision -difficulty breathing -fast heartbeat or  palpitations -hallucinations -light headedness, fainting spells -vomiting Side effects that usually do not require medical attention (report to your doctor or health care professional if they continue or are bothersome): -dizziness -drowsiness -headache -nausea This list may not describe all possible side effects. Call your doctor for medical advice about side effects. You may report side effects to FDA at 1-800-FDA-1088. Where should I keep my medicine? Keep out of the reach of children. Store at room temperature between 15 and 30 degrees C (59 and 86 degrees F). Protect from light. Keep container tightly closed. Throw away any unused medicine after the expiration date. NOTE: This sheet is a summary. It may not cover all possible information. If you have questions about this medicine, talk to your doctor, pharmacist, or health care provider.  2015, Elsevier/Gold Standard. (2007-12-19 17:19:12)  Acetaminophen; Oxycodone tablets What is this medicine? ACETAMINOPHEN; OXYCODONE (a set a MEE noe fen; ox i KOE done) is a pain reliever. It is used to treat mild to moderate pain.  This medicine may be used for other purposes; ask your health care provider or pharmacist if you have questions. COMMON BRAND NAME(S): Endocet, Magnacet, Narvox, Percocet, Perloxx, Primalev, Primlev, Roxicet, Xolox What should I tell my health care provider before I take this medicine? They need to know if you have any of these conditions: -brain tumor -Crohn's disease, inflammatory bowel disease, or ulcerative colitis -drug abuse or addiction -head injury -heart or circulation problems -if you often drink alcohol -kidney disease or problems going to the bathroom -liver disease -lung disease, asthma, or breathing problems -an unusual or allergic reaction to acetaminophen, oxycodone, other opioid analgesics, other medicines, foods, dyes, or preservatives -pregnant or trying to get pregnant -breast-feeding How  should I use this medicine? Take this medicine by mouth with a full glass of water. Follow the directions on the prescription label. Take your medicine at regular intervals. Do not take your medicine more often than directed. Talk to your pediatrician regarding the use of this medicine in children. Special care may be needed. Patients over 47 years old may have a stronger reaction and need a smaller dose. Overdosage: If you think you have taken too much of this medicine contact a poison control center or emergency room at once. NOTE: This medicine is only for you. Do not share this medicine with others. What if I miss a dose? If you miss a dose, take it as soon as you can. If it is almost time for your next dose, take only that dose. Do not take double or extra doses. What may interact with this medicine? -alcohol -antihistamines -barbiturates like amobarbital, butalbital, butabarbital, methohexital, pentobarbital, phenobarbital, thiopental, and secobarbital -benztropine -drugs for bladder problems like solifenacin, trospium, oxybutynin, tolterodine, hyoscyamine, and methscopolamine -drugs for breathing problems like ipratropium and tiotropium -drugs for certain stomach or intestine problems like propantheline, homatropine methylbromide, glycopyrrolate, atropine, belladonna, and dicyclomine -general anesthetics like etomidate, ketamine, nitrous oxide, propofol, desflurane, enflurane, halothane, isoflurane, and sevoflurane -medicines for depression, anxiety, or psychotic disturbances -medicines for sleep -muscle relaxants -naltrexone -narcotic medicines (opiates) for pain -phenothiazines like perphenazine, thioridazine, chlorpromazine, mesoridazine, fluphenazine, prochlorperazine, promazine, and trifluoperazine -scopolamine -tramadol -trihexyphenidyl This list may not describe all possible interactions. Give your health care provider a list of all the medicines, herbs, non-prescription  drugs, or dietary supplements you use. Also tell them if you smoke, drink alcohol, or use illegal drugs. Some items may interact with your medicine. What should I watch for while using this medicine? Tell your doctor or health care professional if your pain does not go away, if it gets worse, or if you have new or a different type of pain. You may develop tolerance to the medicine. Tolerance means that you will need a higher dose of the medication for pain relief. Tolerance is normal and is expected if you take this medicine for a long time. Do not suddenly stop taking your medicine because you may develop a severe reaction. Your body becomes used to the medicine. This does NOT mean you are addicted. Addiction is a behavior related to getting and using a drug for a non-medical reason. If you have pain, you have a medical reason to take pain medicine. Your doctor will tell you how much medicine to take. If your doctor wants you to stop the medicine, the dose will be slowly lowered over time to avoid any side effects. You may get drowsy or dizzy. Do not drive, use machinery, or do anything that needs mental alertness until you know how  this medicine affects you. Do not stand or sit up quickly, especially if you are an older patient. This reduces the risk of dizzy or fainting spells. Alcohol may interfere with the effect of this medicine. Avoid alcoholic drinks. There are different types of narcotic medicines (opiates) for pain. If you take more than one type at the same time, you may have more side effects. Give your health care provider a list of all medicines you use. Your doctor will tell you how much medicine to take. Do not take more medicine than directed. Call emergency for help if you have problems breathing. The medicine will cause constipation. Try to have a bowel movement at least every 2 to 3 days. If you do not have a bowel movement for 3 days, call your doctor or health care professional. Do not  take Tylenol (acetaminophen) or medicines that have acetaminophen with this medicine. Too much acetaminophen can be very dangerous. Many nonprescription medicines contain acetaminophen. Always read the labels carefully to avoid taking more acetaminophen. What side effects may I notice from receiving this medicine? Side effects that you should report to your doctor or health care professional as soon as possible: -allergic reactions like skin rash, itching or hives, swelling of the face, lips, or tongue -breathing difficulties, wheezing -confusion -light headedness or fainting spells -severe stomach pain -unusually weak or tired -yellowing of the skin or the whites of the eyes Side effects that usually do not require medical attention (report to your doctor or health care professional if they continue or are bothersome): -dizziness -drowsiness -nausea -vomiting This list may not describe all possible side effects. Call your doctor for medical advice about side effects. You may report side effects to FDA at 1-800-FDA-1088. Where should I keep my medicine? Keep out of the reach of children. This medicine can be abused. Keep your medicine in a safe place to protect it from theft. Do not share this medicine with anyone. Selling or giving away this medicine is dangerous and against the law. Store at room temperature between 20 and 25 degrees C (68 and 77 degrees F). Keep container tightly closed. Protect from light. This medicine may cause accidental overdose and death if it is taken by other adults, children, or pets. Flush any unused medicine down the toilet to reduce the chance of harm. Do not use the medicine after the expiration date. NOTE: This sheet is a summary. It may not cover all possible information. If you have questions about this medicine, talk to your doctor, pharmacist, or health care provider.  2015, Elsevier/Gold Standard. (2013-01-15 13:17:35)

## 2015-02-04 NOTE — Telephone Encounter (Signed)
New Message       Pt calling stating that Dr. Aundra Dubin prescribed her a medication (pt doesn't know the name of the medication) and she is supposed to take 15 mg of this medication and when her prescription was picked up from the pharmacy she was told that the medication only comes in 30 mg. Pt wants to know what to do. Please call back and advise.

## 2015-02-04 NOTE — Telephone Encounter (Signed)
Pt calling to ask Dr Aundra Dubin if she should be taking Imdur 15 mg or Imdur 30 mg po daily.  Pt states that she was in the hospital and was discharged to take Imdur 15 mg po daily, but when she went to fill this at her pharmacy, they instructed her to contact Dr Aundra Dubin back to get the dose changed to Imdur 30 mg po daily, for they do not carry 15 mg tablets of Imdur and this cannot be scored.  Informed the pt that I will send this to Dr Aundra Dubin and his nurse for further review and recommendation and follow-up with the pt thereafter with the clarification order for Imdur.  Pt verbalized understanding and agrees with this plan.

## 2015-02-04 NOTE — Telephone Encounter (Signed)
She can try 30 mg daily

## 2015-02-04 NOTE — ED Notes (Signed)
This RN attempted to stand patient to walk her to the restroom.  Pt sts she is too dizzy and would like to use the bedpan.  Pt able to use bedpan, but pt sts it increased her headache to use effort in the bed.

## 2015-02-05 MED ORDER — ISOSORBIDE MONONITRATE ER 30 MG PO TB24: 30.0000 mg | ORAL_TABLET | Freq: Every day | ORAL | Status: DC

## 2015-02-05 NOTE — Telephone Encounter (Signed)
LMTCB

## 2015-02-05 NOTE — Telephone Encounter (Signed)
Pt advised,verbalized understanding. 

## 2015-02-05 NOTE — Telephone Encounter (Signed)
Follow up ° ° °Pt returning your call °

## 2015-03-04 ENCOUNTER — Encounter (HOSPITAL_COMMUNITY): Payer: Self-pay | Admitting: Emergency Medicine

## 2015-03-04 ENCOUNTER — Emergency Department (HOSPITAL_COMMUNITY): Payer: Medicaid Other

## 2015-03-04 ENCOUNTER — Emergency Department (HOSPITAL_COMMUNITY)
Admission: EM | Admit: 2015-03-04 | Discharge: 2015-03-05 | Disposition: A | Discharge status: Home / Self Care | Payer: Medicaid Other | Attending: Emergency Medicine | Admitting: Emergency Medicine

## 2015-03-04 DIAGNOSIS — F329 Major depressive disorder, single episode, unspecified: Secondary | ICD-10-CM | POA: Diagnosis not present

## 2015-03-04 DIAGNOSIS — H9192 Unspecified hearing loss, left ear: Secondary | ICD-10-CM | POA: Insufficient documentation

## 2015-03-04 DIAGNOSIS — Z7982 Long term (current) use of aspirin: Secondary | ICD-10-CM | POA: Diagnosis not present

## 2015-03-04 DIAGNOSIS — R51 Headache: Secondary | ICD-10-CM | POA: Diagnosis present

## 2015-03-04 DIAGNOSIS — E785 Hyperlipidemia, unspecified: Secondary | ICD-10-CM | POA: Insufficient documentation

## 2015-03-04 DIAGNOSIS — Z8673 Personal history of transient ischemic attack (TIA), and cerebral infarction without residual deficits: Secondary | ICD-10-CM | POA: Diagnosis not present

## 2015-03-04 DIAGNOSIS — F419 Anxiety disorder, unspecified: Secondary | ICD-10-CM | POA: Diagnosis not present

## 2015-03-04 DIAGNOSIS — I10 Essential (primary) hypertension: Secondary | ICD-10-CM | POA: Diagnosis not present

## 2015-03-04 DIAGNOSIS — E039 Hypothyroidism, unspecified: Secondary | ICD-10-CM | POA: Insufficient documentation

## 2015-03-04 DIAGNOSIS — G8929 Other chronic pain: Secondary | ICD-10-CM | POA: Diagnosis not present

## 2015-03-04 DIAGNOSIS — I252 Old myocardial infarction: Secondary | ICD-10-CM | POA: Diagnosis not present

## 2015-03-04 DIAGNOSIS — G43909 Migraine, unspecified, not intractable, without status migrainosus: Secondary | ICD-10-CM | POA: Diagnosis not present

## 2015-03-04 DIAGNOSIS — Z79899 Other long term (current) drug therapy: Secondary | ICD-10-CM | POA: Insufficient documentation

## 2015-03-04 DIAGNOSIS — R519 Headache, unspecified: Secondary | ICD-10-CM

## 2015-03-04 MED ORDER — DIPHENHYDRAMINE HCL 50 MG/ML IJ SOLN
25.0000 mg | Freq: Once | INTRAMUSCULAR | Status: AC
  Administered 2015-03-04: 25 mg via INTRAVENOUS
  Filled 2015-03-04: qty 1

## 2015-03-04 MED ORDER — ONDANSETRON HCL 4 MG/2ML IJ SOLN
4.0000 mg | Freq: Once | INTRAMUSCULAR | Status: AC
  Administered 2015-03-04: 4 mg via INTRAVENOUS

## 2015-03-04 MED ORDER — FENTANYL CITRATE (PF) 100 MCG/2ML IJ SOLN
INTRAMUSCULAR | Status: AC
  Administered 2015-03-04: 50 ug via INTRAVENOUS
  Filled 2015-03-04: qty 2

## 2015-03-04 MED ORDER — FENTANYL CITRATE (PF) 100 MCG/2ML IJ SOLN
50.0000 ug | Freq: Once | INTRAMUSCULAR | Status: AC
  Administered 2015-03-04: 50 ug via INTRAVENOUS

## 2015-03-04 MED ORDER — ONDANSETRON 4 MG PO TBDP
8.0000 mg | ORAL_TABLET | Freq: Once | ORAL | Status: AC
  Administered 2015-03-04: 8 mg via ORAL
  Filled 2015-03-04: qty 2

## 2015-03-04 MED ORDER — METOCLOPRAMIDE HCL 5 MG/ML IJ SOLN
10.0000 mg | Freq: Once | INTRAMUSCULAR | Status: AC
  Administered 2015-03-04: 10 mg via INTRAVENOUS
  Filled 2015-03-04: qty 2

## 2015-03-04 MED ORDER — SODIUM CHLORIDE 0.9 % IV SOLN
Freq: Once | INTRAVENOUS | Status: AC
  Administered 2015-03-04: 22:00:00 via INTRAVENOUS

## 2015-03-04 MED ORDER — KETOROLAC TROMETHAMINE 30 MG/ML IJ SOLN
30.0000 mg | Freq: Once | INTRAMUSCULAR | Status: AC
  Administered 2015-03-04: 30 mg via INTRAVENOUS
  Filled 2015-03-04: qty 1

## 2015-03-04 MED ORDER — ONDANSETRON HCL 4 MG/2ML IJ SOLN
INTRAMUSCULAR | Status: AC
  Filled 2015-03-04: qty 2

## 2015-03-04 NOTE — ED Notes (Signed)
Pt. reports headache with nausea and emesis onset today .

## 2015-03-04 NOTE — ED Notes (Signed)
NP at bedside.

## 2015-03-04 NOTE — ED Notes (Signed)
Patient transported to CT 

## 2015-03-04 NOTE — ED Provider Notes (Signed)
CSN: [id number]     Arrival date & time 03/04/15  2011 History   First MD Initiated Contact with Patient 03/04/15 2134     Chief Complaint  Patient presents with  . Headache     (Consider location/radiation/quality/duration/timing/severity/associated sxs/prior Treatment) HPI Comments: Gradual onset of headache this afternoon. Headache in occipital region with radiation down neck. Associated photophobia, neck pain, nausea and vomiting, numbness in hands, and left foot. Denies fever, chills, night sweats, recent illness, or trauma. Did not take any medication for relief due to vomiting. Has a history of migraines with onset in left periorbital region with visual manifestations usually relieved with aleve. States this headache is different in location and lacked visual manifestations. Started seeing Dr. Criss Rosales, a PCP 4-5 weeks ago. Not currently on any prophylactic medication for headaches. Does not see a specialist for headaches. Was in [place] over the weekend and was in the hospital on Saturday due to chronic back pain.   Patient is a 54 y.o. female presenting with headaches.  Headache Associated symptoms: nausea, vomiting and weakness   Associated symptoms: no diarrhea, no dizziness, no fever and no numbness     Past Medical History  Diagnosis Date  . Migraine headache   . Chest pain     a. No angiographic CAD by cath 2009 in [place]. There was catheter-induced RCA vasospasm versus microvascular obstruction. bCarlton Adam myoview 04/2010 - EF 79%, normal perfusion.;  c. ETT-Myoview 7/13: no ischemia, EF 77%, submax exercise  . Renal artery stenosis     a. Due to fibromuscular dysplasia. b. Renal stent 2009. c. Studies:  CTA abdomen (1/13) with evidence for fibromuscular dysplasia, right renal artery stent appears patent. Renal artery dopplers (2/13) with FMD but no evidence for signficant stenosis.   . Hypothyroidism 2003    Graves disease s/p radio-iodine treatment in 2003   .  Fibromuscular dysplasia 2009    a. Renal artery stenosis due to this. b. Carotid dopplers 07/2011 c/w FMD but no significant stenosis  . Tachycardia 2000    10/27/10-Holter -NSR with occ sinus tachy-rare PACs, no sig arrhythmia  . Hx of echocardiogram     a. Echo (1/13): EF 16-60%, grade 1 diastolic dysfunction  . HTN (hypertension) 1988    a. Likely related to RAS; urinary metanephrines and plasma renin/aldosterone ratio normal. b. H/o HTN urgency 06/2011 after being off BP meds for a number of days  . HLD (hyperlipidemia) 2000  . Myocardial infarction 2009  . Anxiety 2001  . Depression 2001  . Hypertensive crisis 06/29/2011  . Leiomyoma   . Adenomyosis   . Fibromyalgia 1999  . Coronary vasospasm   . Chronic back pain     had 3 epidural injections on 02-14-13  . Hearing loss of left ear     s/p CVA 2006  . Personal history of colonic polyps-sessile serrated adenoma 02/23/2013  . CVA (cerebral vascular accident) 2006    lost hearing on left  . Irritable bowel syndrome   . Complication of anesthesia     has difficulty voiding after surgery  . H/O Graves' disease 2003    s/p radio-iodine treatment in 2003    Past Surgical History  Procedure Laterality Date  . Renal artery stent  2009  . Spine surgery    . Back surgery  2010 and 2011    In CT.  11/19/2008 (possibly a laminectomy) and in May of 2011 she underwent a L2-L3 fusion.  . Robotic assisted lap vaginal hysterectomy  05/13/2008    [place] hysterectomy  for myomatous uterus   . Colonoscopy    . Colonoscopy    . Cardiac catheterization  2009    states it was normal  . Vaginal hysterectomy    . Knee arthroscopy Left   . Hip surgery Bilateral 1988 and 1989    "scraped head of femur"  . Lumbar laminectomy/decompression microdiscectomy Left 05/25/2013    Procedure: LUMBAR LAMINECTOMY/DECOMPRESSION MICRODISCECTOMY 1 LEVEL;  Surgeon: Winfield Cunas, MD;  Location: [facility];  Service: Neurosurgery;  Laterality: Left;  LEFT  L5S1 microdiskectomy  . Cardiac cath    . Cardiac catheterization N/A 02/03/2015    Procedure: Left Heart Cath and Coronary Angiography;  Surgeon: Larey Dresser, MD;  Location: [facility];  Service: Cardiovascular;  Laterality: N/A;   Family History  Problem Relation Age of Onset  . Heart attack    . Stroke    . Hypertension Mother   . Asthma Mother   . Hyperlipidemia Mother   . Stroke Mother   . Hypertension Father   . Prostate cancer Father   . Heart attack Father   . Hyperlipidemia Father   . Stroke Father   . Hypertension Brother   . Heart attack Brother 76  . Early death Brother 54    massive stroke   . Hyperlipidemia Brother   . Metabolic syndrome Sister   . Depression Sister   . Hypertension Sister   . Diabetes Maternal Aunt   . Colon cancer Neg Hx   . Stomach cancer Neg Hx    Social History  Substance Use Topics  . Smoking status: Never Smoker   . Smokeless tobacco: Never Used  . Alcohol Use: Yes   OB History    No data available     Review of Systems  Constitutional: Negative for fever and chills.  Eyes: Negative for visual disturbance.  Cardiovascular: Negative for chest pain and leg swelling.  Gastrointestinal: Positive for nausea and vomiting. Negative for diarrhea and constipation.  Genitourinary: Negative for dysuria.  Musculoskeletal: Positive for arthralgias.  Skin: Negative for rash and wound.  Neurological: Positive for weakness and headaches. Negative for dizziness, tremors and numbness.  All other systems reviewed and are negative.     Allergies  Hydromorphone hcl; Prochlorperazine edisylate; Sumatriptan; Atorvastatin; and Tramadol  Home Medications   Prior to Admission medications   Medication Sig Start Date End Date Taking? Authorizing Provider  aspirin EC 81 MG tablet Take 81 mg by mouth daily.   Yes Historical Provider, MD  isosorbide mononitrate (IMDUR) 30 MG 24 hr tablet Take 1 tablet (30 mg total) by mouth daily.  02/05/15  Yes Larey Dresser, MD  labetalol (NORMODYNE) 200 MG tablet Take 4 tablets (800 mg total) by mouth 2 (two) times daily. 06/24/14  Yes Larey Dresser, MD  levothyroxine (SYNTHROID, LEVOTHROID) 200 MCG tablet Take 200 mcg by mouth daily before breakfast.   Yes Historical Provider, MD  Linaclotide (LINZESS) 290 MCG CAPS capsule Take 1 capsule (290 mcg total) by mouth daily. 08/26/14  Yes Jayce G Cook, DO  NIFEdipine (PROCARDIA XL/ADALAT-CC) 90 MG 24 hr tablet Take 1 tablet (90 mg total) by mouth daily. 06/24/14  Yes Larey Dresser, MD  Nutritional Supplements (IMMUNOCAL PO) Take 30 g by mouth every morning. Mix with Orange Juice.   Yes Historical Provider, MD  omega-3 acid ethyl esters (LOVAZA) 1 G capsule Take 1 g by mouth daily.   Yes  Historical Provider, MD  orphenadrine (NORFLEX) 100 MG tablet Take 1 tablet (100 mg total) by mouth 2 (two) times daily. 3/66/44  Yes Delora Fuel, MD  OVER THE COUNTER MEDICATION Take 2 scoop by mouth daily. K21 Protein Powder   Yes Historical Provider, MD  Probiotic Product (PROBIOTIC PO) Take 1 capsule by mouth daily.   Yes Historical Provider, MD  QUEtiapine (SEROQUEL) 100 MG tablet Take 100 mg by mouth at bedtime. Per psychiatrist.   Yes Historical Provider, MD  sertraline (ZOLOFT) 50 MG tablet Take 100 mg by mouth daily. Per psychiatrist   Yes Historical Provider, MD  hydrOXYzine (ATARAX/VISTARIL) 10 MG tablet Take 1 tablet (10 mg total) by mouth at bedtime as needed for itching. Patient not taking: Reported on 03/04/2015 08/15/14   Patrecia Pour, MD  levothyroxine (SYNTHROID, LEVOTHROID) 150 MCG tablet TAKE ONE TABLET BY MOUTH DAILY BEFORE BREAKFAST Patient not taking: Reported on 03/04/2015 11/05/14   Patrecia Pour, MD  meclizine (ANTIVERT) 25 MG tablet Take 1 tablet (25 mg total) by mouth 3 (three) times daily as needed for dizziness. Patient not taking: Reported on 03/04/2015 09/28/13   Shanon Rosser, MD  metoCLOPramide (REGLAN) 10 MG tablet Take 1 tablet (10  mg total) by mouth every 6 (six) hours as needed for nausea or vomiting (or headache). 03/05/15   Junius Creamer, NP  nitroGLYCERIN (NITROSTAT) 0.4 MG SL tablet Place 1 tablet (0.4 mg total) under the tongue every 5 (five) minutes as needed. x3 doses as needed for chest pain 04/03/13   Larey Dresser, MD  oxyCODONE-acetaminophen (PERCOCET) 5-325 MG per tablet Take 1 tablet by mouth every 4 (four) hours as needed for moderate pain. Patient not taking: Reported on 03/04/2015 0/34/74   Delora Fuel, MD  rosuvastatin (CRESTOR) 40 MG tablet Take 40 mg by mouth daily.    Historical Provider, MD   BP 117/61 mmHg  Pulse 84  Temp(Src) 97.7 F (36.5 C) (Oral)  Resp 11  Ht 5\' 4"  (1.626 m)  Wt 135 lb (61.236 kg)  BMI 23.16 kg/m2  SpO2 100% Physical Exam  Constitutional: She is oriented to person, place, and time. She appears well-developed and well-nourished.  HENT:  Head: Normocephalic.  Eyes: Pupils are equal, round, and reactive to light.  Neck: Normal range of motion.  Cardiovascular: Normal rate and regular rhythm.   Pulmonary/Chest: Effort normal and breath sounds normal.  Musculoskeletal: She exhibits no edema or tenderness.  Neurological: She is alert and oriented to person, place, and time. She displays no tremor. No cranial nerve deficit or sensory deficit. She displays a negative Romberg sign.  Strength assessment slightly weaker on left, but likely due to lack of effort.   Skin: Skin is warm and dry. No rash noted.  Psychiatric: She has a normal mood and affect.    ED Course  Procedures (including critical care time) Labs Review Labs Reviewed - No data to display  Imaging Review Ct Head Wo Contrast  03/05/2015   CLINICAL DATA:  Initial evaluation for acute headache with right-sided weakness.  EXAM: CT HEAD WITHOUT CONTRAST  TECHNIQUE: Contiguous axial images were obtained from the base of the skull through the vertex without intravenous contrast.  COMPARISON:  Prior study from  02/03/2015.  FINDINGS: There is no acute intracranial hemorrhage or infarct. No mass lesion or midline shift. Gray-white matter differentiation is well maintained. Ventricles are normal in size without evidence of hydrocephalus. CSF containing spaces are within normal limits. No extra-axial fluid collection. Focal  hypodensity within the deep white matter of the anterior right centrum semi ovale may reflect small dilated perivascular space versus possible small remote lacunar infarct. This is stable.  The calvarium is intact.  Orbital soft tissues are within normal limits.  The paranasal sinuses and mastoid air cells are well pneumatized and free of fluid.  Scalp soft tissues are unremarkable.  IMPRESSION: Negative head CT with no acute intracranial process identified.   Electronically Signed   By: Jeannine Boga M.D.   On: 03/05/2015 00:29   I have personally reviewed and evaluated these images and lab results as part of my medical decision-making.   EKG Interpretation None     Head ache improving after headache IV Reglan, Benadryl and Toradol  Head Ct Scan normal weakness has resolved  MDM   Final diagnoses:  Nonintractable episodic headache, unspecified headache type        Junius Creamer, NP 03/05/15 Waverly Hall, NP 03/05/15 [place], DO 03/05/15 [id number]

## 2015-03-05 MED ORDER — METOCLOPRAMIDE HCL 10 MG PO TABS: 10.0000 mg | ORAL_TABLET | Freq: Four times a day (QID) | ORAL | Status: DC | PRN

## 2015-03-05 NOTE — Discharge Instructions (Signed)
You have been given a prescription for Reglan that you should take at the onset of your headache/nausea.  You have also been give a referral to [facility] and the Headache canter to better manage your headaches

## 2015-03-19 ENCOUNTER — Other Ambulatory Visit: Payer: Self-pay | Admitting: Family Medicine

## 2015-03-20 NOTE — Telephone Encounter (Signed)
LVM for pt to call back to inform her of below. Zimmerman Rumple, April D, CMA  

## 2015-03-20 NOTE — Telephone Encounter (Signed)
She needs to have an appointment because her thyroid function has not been normal and was last checked in March. This appointment can be with me or any provider. I will fill 1 month of synthroid to get her to that appointment.

## 2015-03-24 NOTE — Telephone Encounter (Signed)
Pt informed of below and will call back to schedule an appointment. Katharina Caper, April D, [place]

## 2015-04-10 ENCOUNTER — Other Ambulatory Visit: Payer: Self-pay | Admitting: Neurosurgery

## 2015-04-10 DIAGNOSIS — M5442 Lumbago with sciatica, left side: Secondary | ICD-10-CM

## 2015-04-11 ENCOUNTER — Ambulatory Visit
Admission: RE | Admit: 2015-04-11 | Discharge: 2015-04-11 | Disposition: A | Discharge status: Home / Self Care | Payer: Medicaid Other | Source: Ambulatory Visit | Attending: Neurosurgery | Admitting: Neurosurgery

## 2015-04-11 DIAGNOSIS — M5442 Lumbago with sciatica, left side: Secondary | ICD-10-CM

## 2015-04-11 MED ORDER — GADOBENATE DIMEGLUMINE 529 MG/ML IV SOLN
12.0000 mL | Freq: Once | INTRAVENOUS | Status: AC | PRN
  Administered 2015-04-11: 12 mL via INTRAVENOUS

## 2015-05-19 ENCOUNTER — Other Ambulatory Visit: Payer: Self-pay | Admitting: Family Medicine

## 2015-05-19 ENCOUNTER — Ambulatory Visit (INDEPENDENT_AMBULATORY_CARE_PROVIDER_SITE_OTHER): Payer: Medicaid Other | Admitting: Family Medicine

## 2015-05-19 ENCOUNTER — Encounter: Payer: Self-pay | Admitting: Family Medicine

## 2015-05-19 VITALS — BP 110/80 | HR 80 | Temp 98.4°F | Ht 64.0 in | Wt 138.0 lb

## 2015-05-19 DIAGNOSIS — L299 Pruritus, unspecified: Secondary | ICD-10-CM | POA: Diagnosis not present

## 2015-05-19 DIAGNOSIS — Z23 Encounter for immunization: Secondary | ICD-10-CM | POA: Diagnosis not present

## 2015-05-19 DIAGNOSIS — B360 Pityriasis versicolor: Secondary | ICD-10-CM | POA: Diagnosis present

## 2015-05-19 MED ORDER — HYDROXYZINE HCL 10 MG PO TABS: 10.0000 mg | ORAL_TABLET | Freq: Every evening | ORAL | Status: DC | PRN

## 2015-05-19 MED ORDER — KETOCONAZOLE 2 % EX CREA: 1.0000 "application " | TOPICAL_CREAM | Freq: Every day | CUTANEOUS | Status: DC

## 2015-05-19 NOTE — Progress Notes (Signed)
   Subjective:   [name] Rowe is a 54 y.o. female with a history of RAS, fibromyalgia, HTN, hypothyroidism, chronic constipation here for same day appt for itching of feet and hands, brown spot on back.  Itching of hands and feet - no rash - reports that another doctor told her this was all in her head - started September 2nd, 2016 - occurs for 5-10 minutes and then goes away, many times per day and during the night - has not noticed any associated activities that precipitate it. - came on all of a sudden - no new soaps, detergents, etc - taking benadryl, but it wasn't helping  Brown spot on back - using a cream (unsure of what kind), but it didn't help - spot is growing - itchy - started in the summer  Review of Systems:  Per HPI. No fevers, no jaundice.   PMH, PSH, Medications, Allergies, and FmHx reviewed and updated in EMR.  Social History: never smoker  Objective:  BP 110/80 mmHg  Pulse 80  Temp(Src) 98.4 F (36.9 C) (Oral)  Ht 5\' 4"  (1.626 m)  Wt 138 lb (62.596 kg)  BMI 23.68 kg/m2  Gen:  54 y.o. female in NAD HEENT: NCAT, MMM, EOMI, PERRL, anicteric sclerae CV: RRR, no MRG, intact distal pulses Resp: Non-labored, CTAB, no wheezes noted Abd: Soft, NTND, BS present, no guarding or organomegaly Ext: WWP, no edema, no rashes on extremities Skin: Splotchy hyperpigmented non-raised rash over thoracic back. MSK: No obvious deformities Neuro: Alert and oriented, speech normal     Assessment & Plan:     Talaya Weyland is a 54 y.o. female here for itching of hands and feet, tinea versicolor.  Tinea versicolor Rash on back consistent with tinea versicolor Treat with ketoconazole cream until resolves F/u prn  Itching No obvious source of itching on palms and soles No rash noted, no evidence of liver dysfunction on recent CMET (though it would be odd for cholestasis to cause such localized itching) Could very well be manifestation of anxiety Atarax prn to  help with itching at bedtime Trial of sarna lotion on itching areas for symptom treatment F/u with PCP for continuity     Virginia Crews, MD MPH PGY-2,  [facility] 05/20/2015  8:11 AM

## 2015-05-19 NOTE — Patient Instructions (Signed)
Nice to meet you today. I think the rash on your back is called tinea versicolor.  You should use ketoconazole cream daily until it resolves.   I can't find a great explanation for the itching of her hands and feet. All of your recent lab work looks good. You can use Atarax at bedtime as needed to help you sleep with the itching. You can also use Sarna lotion on your hands and feet to help ease the sensation of itching.   If this does not resolve, come back to see her primary care doctor for follow-up.  Take care, Dr. Jacinto Reap  Tinea Versicolor Tinea versicolor is a common fungal infection of the skin. It causes a rash that appears as light or dark patches on the skin. The rash most often occurs on the chest, back, neck, or upper arms. This condition is more common during warm weather. Other than affecting how your skin looks, tinea versicolor usually does not cause other problems. In most cases, the infection goes away in a few weeks with treatment. It may take a few months for the patches on your skin to clear up. CAUSES Tinea versicolor occurs when a type of fungus that is normally present on the skin starts to overgrow. This fungus is a kind of yeast. The exact cause of the overgrowth is not known. This condition cannot be passed from one person to another (noncontagious). RISK FACTORS This condition is more likely to develop when certain factors are present, such as:  Heat and humidity.  Sweating too much.  Hormone changes.  Oily skin.  A weak defense (immune) system. SYMPTOMS Symptoms of this condition may include:  A rash on your skin that is made up of light or dark patches. The rash may have:  Patches of tan or pink spots on light skin.  Patches of white or brown spots on dark skin.  Patches of skin that do not tan.  Well-marked edges.  Scales on the discolored areas.  Mild itching. DIAGNOSIS A health care provider can usually diagnose this condition by looking at your  skin. During the exam, he or she may use ultraviolet light to help determine the extent of the infection. In some cases, a skin sample may be taken by scraping the rash. This sample will be viewed under a microscope to check for yeast overgrowth. TREATMENT Treatment for this condition may include:  Dandruff shampoo that is applied to the affected skin during showers or bathing.  Over-the-counter medicated skin cream, lotion, or soaps.  Prescription antifungal medicine in the form of skin cream or pills.  Medicine to help reduce itching. HOME CARE INSTRUCTIONS  Take medicines only as directed by your health care provider.  Apply dandruff shampoo to the affected area if told to do so by your health care provider. You may be instructed to scrub the affected skin for several minutes each day.  Do not scratch the affected area of skin.  Avoid hot and humid conditions.  Do not use tanning booths.  Try to avoid sweating a lot. SEEK MEDICAL CARE IF:  Your symptoms get worse.  You have a fever.  You have redness, swelling, or pain at the site of your rash.  You have fluid, blood, or pus coming from your rash.  Your rash returns after treatment.   This information is not intended to replace advice given to you by your health care provider. Make sure you discuss any questions you have with your health care provider.  Document Released: 05/21/2000 Document Revised: 06/14/2014 Document Reviewed: 03/05/2014 Elsevier Interactive Patient Education [employer].

## 2015-05-20 NOTE — Assessment & Plan Note (Signed)
Rash on back consistent with tinea versicolor Treat with ketoconazole cream until resolves F/u prn

## 2015-05-20 NOTE — Assessment & Plan Note (Signed)
No obvious source of itching on palms and soles No rash noted, no evidence of liver dysfunction on recent CMET (though it would be odd for cholestasis to cause such localized itching) Could very well be manifestation of anxiety Atarax prn to help with itching at bedtime Trial of sarna lotion on itching areas for symptom treatment F/u with PCP for continuity

## 2015-05-28 ENCOUNTER — Other Ambulatory Visit: Payer: Self-pay | Admitting: Family Medicine

## 2015-05-28 DIAGNOSIS — E039 Hypothyroidism, unspecified: Secondary | ICD-10-CM

## 2015-07-28 ENCOUNTER — Encounter: Payer: Self-pay | Admitting: Family Medicine

## 2015-07-28 ENCOUNTER — Ambulatory Visit (INDEPENDENT_AMBULATORY_CARE_PROVIDER_SITE_OTHER): Payer: Medicaid Other | Admitting: Family Medicine

## 2015-07-28 VITALS — BP 185/114 | HR 99 | Temp 98.6°F | Ht 64.0 in | Wt 143.2 lb

## 2015-07-28 DIAGNOSIS — E038 Other specified hypothyroidism: Secondary | ICD-10-CM | POA: Diagnosis not present

## 2015-07-28 DIAGNOSIS — Z1159 Encounter for screening for other viral diseases: Secondary | ICD-10-CM | POA: Diagnosis not present

## 2015-07-28 DIAGNOSIS — L299 Pruritus, unspecified: Secondary | ICD-10-CM

## 2015-07-28 DIAGNOSIS — I10 Essential (primary) hypertension: Secondary | ICD-10-CM | POA: Diagnosis present

## 2015-07-28 LAB — HEPATITIS C ANTIBODY: HCV Ab: NEGATIVE

## 2015-07-28 NOTE — Assessment & Plan Note (Signed)
No indication of organic etiology on previous work up or exam today. Primary suspected etiology is psychogenic, but could not elicit cause. Will check TSH as this is due, though this is unlikely to be cause.

## 2015-07-28 NOTE — Progress Notes (Signed)
Subjective: [name] Rowe is a 55 y.o. female presenting for itching.   Describes itching in her hands and feet since September, intermittent, not brought on by any exposures that she can tell. Occur at all times of day, lasts seconds to hours, not improved by and moisturizing creams/lotions, po medication prescribed at last office visit, or changing detergents and soaps to "all natural" or "hypoallergenic."   She has a history of vaginal itching which is also still occurring. She denies that episodes are concomitant with stresses.   She stopped taking all medications, including BP medications, because she is just very tired of taking medications. No CP, SOB, HA, vision changes, palpitations, leg swelling, claudications. She does not need refills.   - ROS: No fevers, rash/redness, affected contacts - Non-smoker - PMH: fibromyalgia, anxiety, depression, hypothyroidism, tinea on back treated with ketoconazole in Dec 2016, medication noncompliance.  Objective: BP 185/114 mmHg  Pulse 99  Temp(Src) 98.6 F (37 C) (Oral)  Ht 5\' 4"  (1.626 m)  Wt 143 lb 3.2 oz (64.955 kg)  BMI 24.57 kg/m2 Gen: Anxious but well-appearing 55 y.o. female in no distress CV: Borderline tachycardic rate, no murmur; radial, DP and PT pulses 2+ bilaterally; no LE edema, no JVD, cap refill < 2 sec. Pulm: Non-labored breathing ambient air; CTAB, no wheezes or crackles Skin: No rashes, wounds, ulcers. Hands and feet are without abnormality.   Assessment/Plan: [name] is a 55 y.o. female here for itching follow up.  Itching No indication of organic etiology on previous work up or exam today. Primary suspected etiology is psychogenic, but could not elicit cause. Will check TSH as this is due, though this is unlikely to be cause.   Essential hypertension Uncontrolled resistant HTN related to fibromuscular dysplasia causing renal artery stenosis. Chronically noncompliant, hence severe range HTN in office today.  Asymptomatic. Urged to restart medications and not discontinue without discussing with provider.

## 2015-07-28 NOTE — Patient Instructions (Signed)
Thank you for coming in today!  We are checking some labs today, and we'll call you if they are abnormal. If you do not hear from me by phone or letter in 2 weeks, please call us as we may have been unable to reach you.   - Make sure you schedule your annual exam. You are due for a pap smear and a mammogram. You can schedule this on your way out today. - Taking the medications for your blood pressure reduces your risk of heart attacks and strokes.   Our clinic's number is [phone]. Feel free to call any time with questions or concerns. We will answer any questions after hours with our 24-hour emergency line at that number as well.   - Dr. Bonner Puna

## 2015-07-28 NOTE — Assessment & Plan Note (Signed)
Uncontrolled resistant HTN related to fibromuscular dysplasia causing renal artery stenosis. Chronically noncompliant, hence severe range HTN in office today. Asymptomatic. Urged to restart medications and not discontinue without discussing with provider.

## 2015-07-29 LAB — TSH: TSH: 1.07 mIU/L

## 2015-07-30 ENCOUNTER — Encounter: Payer: Self-pay | Admitting: Family Medicine

## 2015-08-28 ENCOUNTER — Ambulatory Visit (INDEPENDENT_AMBULATORY_CARE_PROVIDER_SITE_OTHER): Payer: Medicaid Other | Admitting: Family Medicine

## 2015-08-28 ENCOUNTER — Encounter: Payer: Self-pay | Admitting: Family Medicine

## 2015-08-28 VITALS — BP 145/75 | HR 97 | Temp 98.4°F | Ht 64.0 in | Wt 142.5 lb

## 2015-08-28 DIAGNOSIS — Z1239 Encounter for other screening for malignant neoplasm of breast: Secondary | ICD-10-CM | POA: Diagnosis not present

## 2015-08-28 DIAGNOSIS — L299 Pruritus, unspecified: Secondary | ICD-10-CM

## 2015-08-28 MED ORDER — ROSUVASTATIN CALCIUM 40 MG PO TABS: 40.0000 mg | ORAL_TABLET | Freq: Every day | ORAL | Status: DC

## 2015-08-28 MED ORDER — TRIAMCINOLONE ACETONIDE 0.1 % EX CREA: 1.0000 "application " | TOPICAL_CREAM | Freq: Two times a day (BID) | CUTANEOUS | Status: DC

## 2015-08-28 NOTE — Patient Instructions (Addendum)
Thank you for coming in today!  - You need to call to schedule your mammogram - Apply triamcinolone to the itchy areas as needed - Try changing to dove soap - Try to limit anxiety/stress as much as possible - Return in 2 - 4 weeks if no better   Our clinic's number is [phone]. Feel free to call any time with questions or concerns. We will answer any questions after hours with our 24-hour emergency line at that number as well.   - Dr. Bonner Puna

## 2015-08-28 NOTE — Progress Notes (Signed)
Subjective: [name] Rowe is a 55 y.o. female with a history of hypothyroidism and generalized itching here for itching follow up.  She has severe itching generally with worsening in her vulvar region. She has said this is getting worse, but this has been a problem for years. This is now constant. She denies that hot or cold, or sun or exercise are triggers. Has changed detergents multiple times including hypoallergenic and odorless varieties without improvements. Has tried many lotions including aveeno and others marketed for dry skin. She reports no improvement in her symptoms with atarax which she took one day and one night.   Last mammogram: 2014, BiRADS 1 FH of breast, uterine, ovarian, colon cancer: No Breast mass or concerns: No Had lap total hysterectomy for myomatous uterus in 2009.    - ROS: Denies fever, chills, weight loss, changes in vision or hearing, headache, cough, sore throat, chest pain, palpitations, shortness of breath, abdominal pain, nausea, vomiting, changes in bowel habits, blood in stool, change in bladder habits, myalgias, arthralgias, and rash.   Objective: BP 145/75 mmHg  Pulse 97  Temp(Src) 98.4 F (36.9 C) (Oral)  Ht 5\' 4"  (1.626 m)  Wt 142 lb 8 oz (64.638 kg)  BMI 24.45 kg/m2 Gen:  55 y.o. female in NAD Skin: No rashes, wounds, ulcers Psych: Fidgety, self described "OCD," pleasant and appropriate. Neatly dressed, good eye contact. No thoughts of harming self or others. Cognition normal.  Assessment/Plan: [name] Rowe is a 55 y.o. female here for generalized pruritus.   Itching Ongoing chronic problem, stable. Broached theory that anxiety contributes to or causes this, though she declines medications at this time.  - Rx topical steroid for vulvar pruritus with discussion to avoid overuse - continue atarax prn - Pt is interested in dermatology referral. Plan per our discussion is to refer if symptoms persist.  Health maintenance:  - Mammogram due,  ordered  HLD: - Reordered crestor, willing to prior authorize if required.

## 2015-08-28 NOTE — Assessment & Plan Note (Addendum)
Ongoing chronic problem, stable. Broached theory that anxiety contributes to or causes this, though she declines medications at this time.  - Rx topical steroid for vulvar pruritus with discussion to avoid overuse - continue atarax prn - Pt is interested in dermatology referral. Plan per our discussion is to refer if symptoms persist.

## 2015-09-04 ENCOUNTER — Telehealth: Payer: Self-pay | Admitting: *Deleted

## 2015-09-04 NOTE — Telephone Encounter (Signed)
Prior Authorization received from [employer] for Crestor 40 mg. Formulary and PA form placed in provider box for completion. Derl Barrow, RN

## 2015-09-05 ENCOUNTER — Other Ambulatory Visit: Payer: Self-pay | Admitting: Family Medicine

## 2015-09-05 MED ORDER — PRAVASTATIN SODIUM 40 MG PO TABS: 40.0000 mg | ORAL_TABLET | Freq: Every day | ORAL | Status: DC

## 2015-09-05 NOTE — Telephone Encounter (Signed)
Noted prior arthralgias to atorvastatin, concern for simvastatin interactions. Will Rx pravastatin as before.

## 2015-09-18 ENCOUNTER — Telehealth: Payer: Self-pay | Admitting: *Deleted

## 2015-09-18 NOTE — Telephone Encounter (Signed)
Prior Authorization received from [employer] for Crestor 40 mg. Received PA approval for Crestor 40 mg via [employer].  Med approved for 09/18/15 - 09/17/16.  [facility] pharmacy informed.  PA approval number [id number].  Patient has tried and failed Atorvastatin 40 mg and 80 mg; Simvastatin 20 mg. Derl Barrow, RN

## 2015-09-18 NOTE — Telephone Encounter (Signed)
Tamika, thank you for taking care of this. You are great.

## 2015-09-23 ENCOUNTER — Telehealth: Payer: Self-pay | Admitting: Family Medicine

## 2015-09-23 ENCOUNTER — Other Ambulatory Visit: Payer: Self-pay | Admitting: Family Medicine

## 2015-09-23 DIAGNOSIS — Z1239 Encounter for other screening for malignant neoplasm of breast: Secondary | ICD-10-CM

## 2015-09-23 DIAGNOSIS — Z1231 Encounter for screening mammogram for malignant neoplasm of breast: Secondary | ICD-10-CM

## 2015-09-23 NOTE — Telephone Encounter (Signed)
Pt is calling and

## 2015-09-23 NOTE — Telephone Encounter (Signed)
Pt is calling and would like to get a referral for a Mammogram. She is having some pains in her breast. jw

## 2015-09-23 NOTE — Telephone Encounter (Signed)
She already has a screening mammogram ordered. If there is a specific place she is worried about she should make an appointment and have a diagnostic mammogram. Otherwise, she can call the [facility] to set up the screening mammogram.

## 2015-09-25 NOTE — Telephone Encounter (Signed)
LM for pt to call the office. If she calls, please have her speak with Nehemiah Settle or April. Ottis Stain, CMA

## 2015-10-07 NOTE — Telephone Encounter (Signed)
LVM for pt to call the office. Aften Lipsey T Kofi Murrell, CMA  

## 2015-10-22 NOTE — Telephone Encounter (Signed)
Pt has an appointment for 5/25 to meet with Dr. Bonner Puna about referral. Ottis Stain, [place]

## 2015-10-24 ENCOUNTER — Emergency Department (HOSPITAL_COMMUNITY)
Admission: EM | Admit: 2015-10-24 | Discharge: 2015-10-24 | Disposition: A | Discharge status: Home / Self Care | Payer: Medicaid Other | Attending: Emergency Medicine | Admitting: Emergency Medicine

## 2015-10-24 ENCOUNTER — Emergency Department (HOSPITAL_COMMUNITY): Payer: Medicaid Other

## 2015-10-24 ENCOUNTER — Encounter (HOSPITAL_COMMUNITY): Payer: Self-pay

## 2015-10-24 DIAGNOSIS — F329 Major depressive disorder, single episode, unspecified: Secondary | ICD-10-CM | POA: Insufficient documentation

## 2015-10-24 DIAGNOSIS — E785 Hyperlipidemia, unspecified: Secondary | ICD-10-CM | POA: Diagnosis not present

## 2015-10-24 DIAGNOSIS — Z96652 Presence of left artificial knee joint: Secondary | ICD-10-CM | POA: Diagnosis not present

## 2015-10-24 DIAGNOSIS — R0789 Other chest pain: Secondary | ICD-10-CM | POA: Insufficient documentation

## 2015-10-24 DIAGNOSIS — Z8673 Personal history of transient ischemic attack (TIA), and cerebral infarction without residual deficits: Secondary | ICD-10-CM | POA: Diagnosis not present

## 2015-10-24 DIAGNOSIS — Z79899 Other long term (current) drug therapy: Secondary | ICD-10-CM | POA: Insufficient documentation

## 2015-10-24 DIAGNOSIS — R079 Chest pain, unspecified: Secondary | ICD-10-CM

## 2015-10-24 DIAGNOSIS — E039 Hypothyroidism, unspecified: Secondary | ICD-10-CM | POA: Diagnosis not present

## 2015-10-24 DIAGNOSIS — Z7982 Long term (current) use of aspirin: Secondary | ICD-10-CM | POA: Insufficient documentation

## 2015-10-24 DIAGNOSIS — I252 Old myocardial infarction: Secondary | ICD-10-CM | POA: Insufficient documentation

## 2015-10-24 LAB — BASIC METABOLIC PANEL
Anion gap: 6 (ref 5–15)
BUN: 19 mg/dL (ref 6–20)
CALCIUM: 9.4 mg/dL (ref 8.9–10.3)
CO2: 27 mmol/L (ref 22–32)
Chloride: 105 mmol/L (ref 101–111)
Creatinine, Ser: 0.99 mg/dL (ref 0.44–1.00)
GFR calc Af Amer: >60 mL/min (ref >60)
GLUCOSE: 85 mg/dL (ref 65–99)
Potassium: 3.8 mmol/L (ref 3.5–5.1)
SODIUM: 138 mmol/L (ref 135–145)

## 2015-10-24 LAB — TROPONIN I

## 2015-10-24 LAB — CBC
HCT: 40.8 % (ref 36.0–46.0)
HEMOGLOBIN: 14.5 g/dL (ref 12.0–15.0)
MCH: 29.6 pg (ref 26.0–34.0)
MCHC: 35.5 g/dL (ref 30.0–36.0)
MCV: 83.3 fL (ref 78.0–100.0)
Platelets: 271 10*3/uL (ref 150–400)
RBC: 4.9 MIL/uL (ref 3.87–5.11)
RDW: 12.6 % (ref 11.5–15.5)
WBC: 5.5 10*3/uL (ref 4.0–10.5)

## 2015-10-24 LAB — I-STAT TROPONIN, ED: TROPONIN I, POC: 0 ng/mL (ref 0.00–0.08)

## 2015-10-24 LAB — TSH: TSH: 1.501 u[IU]/mL (ref 0.350–4.500)

## 2015-10-24 MED ORDER — IOPAMIDOL (ISOVUE-370) INJECTION 76%
100.0000 mL | Freq: Once | INTRAVENOUS | Status: AC | PRN
  Administered 2015-10-24: 100 mL via INTRAVENOUS

## 2015-10-24 NOTE — ED Notes (Signed)
Pt here visiting friend.  Pt started having center chest pain with heaviness in arms.  Shortness of breath.  No recent cough/fever/or muscle injury. Pt has hx of MI in 2009.  Pt took friends nitro with some relief.

## 2015-10-24 NOTE — ED Provider Notes (Signed)
CSN: [id number]     Arrival date & time 10/24/15  1312 History   First MD Initiated Contact with Patient 10/24/15 1403     Chief Complaint  Patient presents with  . Chest Pain  PT HERE AT [facility] VISITING A FRIEND.  AROUND 1300, SHE DEVELOPED CP WITH HEAVINESS TO BOTH ARMS (L>R).  THE PT SAID THAT SHE TOOK A FRIEND'S NITRO W/O RELIEF.  PT DID HAVE A CARDIAC CATH 01/2015 THAT SHOWED A 40-50% STENOSIS OF LAD.  CARDIOLOGIST SAID THERE WAS NO CHANGE IN SX WITH IA NITRO AND SHE WAS DX'D WITH VASOSPASM.  THE PT WAS GIVEN A RX FOR IMDUR, BUT THAT GAVE HER A MIGRAINE, SO SHE STOPPED TAKING THIS MED.   (Consider location/radiation/quality/duration/timing/severity/associated sxs/prior Treatment) Patient is a 55 y.o. female presenting with chest pain. The history is provided by the patient.  Chest Pain Pain location:  L chest Pain quality: pressure   Pain radiates to:  L arm and R arm Pain radiates to the back: no   Pain severity:  Mild Onset quality:  Sudden Timing:  Constant Progression:  Unchanged Chronicity:  New Relieved by:  Nothing Ineffective treatments:  Nitroglycerin   Past Medical History  Diagnosis Date  . Migraine headache   . Chest pain     a. No angiographic CAD by cath 2009 in [place]. There was catheter-induced RCA vasospasm versus microvascular obstruction. bCarlton Adam myoview 04/2010 - EF 79%, normal perfusion.;  c. ETT-Myoview 7/13: no ischemia, EF 77%, submax exercise  . Renal artery stenosis ([place])     a. Due to fibromuscular dysplasia. b. Renal stent 2009. c. Studies:  CTA abdomen (1/13) with evidence for fibromuscular dysplasia, right renal artery stent appears patent. Renal artery dopplers (2/13) with FMD but no evidence for signficant stenosis.   . Hypothyroidism 2003    Graves disease s/p radio-iodine treatment in 2003   . Fibromuscular dysplasia ([place]) 2009    a. Renal artery stenosis due to this. b. Carotid dopplers 07/2011 c/w FMD but no significant stenosis  .  Tachycardia 2000    10/27/10-Holter -NSR with occ sinus tachy-rare PACs, no sig arrhythmia  . Hx of echocardiogram     a. Echo (1/13): EF 0000000, grade 1 diastolic dysfunction  . HTN (hypertension) 1988    a. Likely related to RAS; urinary metanephrines and plasma renin/aldosterone ratio normal. b. H/o HTN urgency 06/2011 after being off BP meds for a number of days  . HLD (hyperlipidemia) 2000  . Myocardial infarction ([place]) 2009  . Anxiety 2001  . Depression 2001  . Hypertensive crisis 06/29/2011  . Leiomyoma   . Adenomyosis   . Fibromyalgia 1999  . Coronary vasospasm ([place])   . Chronic back pain     had 3 epidural injections on 02-14-13  . Hearing loss of left ear     s/p CVA 2006  . Personal history of colonic polyps-sessile serrated adenoma 02/23/2013  . CVA (cerebral vascular accident) ([place]) 2006    lost hearing on left  . Irritable bowel syndrome   . Complication of anesthesia     has difficulty voiding after surgery  . H/O Graves' disease 2003    s/p radio-iodine treatment in 2003    Past Surgical History  Procedure Laterality Date  . Renal artery stent  2009  . Spine surgery    . Back surgery  2010 and 2011    In CT.  11/19/2008 (possibly a laminectomy) and in May of 2011 she underwent a  L2-L3 fusion.  . Robotic assisted lap vaginal hysterectomy  05/13/2008    [place] hysterectomy  for myomatous uterus   . Colonoscopy    . Colonoscopy    . Cardiac catheterization  2009    states it was normal  . Vaginal hysterectomy    . Knee arthroscopy Left   . Hip surgery Bilateral 1988 and 1989    "scraped head of femur"  . Lumbar laminectomy/decompression microdiscectomy Left 05/25/2013    Procedure: LUMBAR LAMINECTOMY/DECOMPRESSION MICRODISCECTOMY 1 LEVEL;  Surgeon: Winfield Cunas, MD;  Location: [facility];  Service: Neurosurgery;  Laterality: Left;  LEFT L5S1 microdiskectomy  . Cardiac cath    . Cardiac catheterization N/A 02/03/2015    Procedure: Left Heart Cath and  Coronary Angiography;  Surgeon: Larey Dresser, MD;  Location: [facility];  Service: Cardiovascular;  Laterality: N/A;   Family History  Problem Relation Age of Onset  . Heart attack    . Stroke    . Hypertension Mother   . Asthma Mother   . Hyperlipidemia Mother   . Stroke Mother   . Hypertension Father   . Prostate cancer Father   . Heart attack Father   . Hyperlipidemia Father   . Stroke Father   . Hypertension Brother   . Heart attack Brother 87  . Early death Brother 17    massive stroke   . Hyperlipidemia Brother   . Metabolic syndrome Sister   . Depression Sister   . Hypertension Sister   . Diabetes Maternal Aunt   . Colon cancer Neg Hx   . Stomach cancer Neg Hx    Social History  Substance Use Topics  . Smoking status: Never Smoker   . Smokeless tobacco: Never Used  . Alcohol Use: Yes   OB History    No data available     Review of Systems  Cardiovascular: Positive for chest pain.  All other systems reviewed and are negative.     Allergies  Hydromorphone hcl; Prochlorperazine edisylate; Sumatriptan; Atorvastatin; and Tramadol  Home Medications   Prior to Admission medications   Medication Sig Start Date End Date Taking? Authorizing Provider  aspirin EC 81 MG tablet Take 81 mg by mouth daily.   Yes Historical Provider, MD  isosorbide mononitrate (IMDUR) 30 MG 24 hr tablet Take 1 tablet (30 mg total) by mouth daily. 02/05/15  Yes Larey Dresser, MD  ketoconazole (NIZORAL) 2 % cream Apply 1 application topically daily. Until dark spot resolves Patient taking differently: Apply 1 application topically daily as needed for irritation. Until dark spot resolves 05/19/15  Yes Virginia Crews, MD  labetalol (NORMODYNE) 200 MG tablet Take 4 tablets (800 mg total) by mouth 2 (two) times daily. Patient taking differently: Take 400 mg by mouth 2 (two) times daily.  06/24/14  Yes Larey Dresser, MD  levothyroxine (SYNTHROID, LEVOTHROID) 150 MCG tablet  TAKE ONE TABLET BY MOUTH ONCE DAILY BEFORE BREAKFAST 05/29/15  Yes Patrecia Pour, MD  Linaclotide [facility]) 290 MCG CAPS capsule Take 1 capsule (290 mcg total) by mouth daily. 08/26/14  Yes Jayce G Cook, DO  NIFEdipine (PROCARDIA XL/ADALAT-CC) 90 MG 24 hr tablet Take 1 tablet (90 mg total) by mouth daily. 06/24/14  Yes Larey Dresser, MD  nitroGLYCERIN (NITROSTAT) 0.4 MG SL tablet Place 1 tablet (0.4 mg total) under the tongue every 5 (five) minutes as needed. x3 doses as needed for chest pain 04/03/13  Yes Larey Dresser,  MD  Omega-3 Fatty Acids (FISH OIL PO) Take 1 capsule by mouth daily.   Yes Historical Provider, MD  Probiotic Product (PROBIOTIC PO) Take 1 capsule by mouth daily.   Yes Historical Provider, MD  QUEtiapine (SEROQUEL) 100 MG tablet Take 100 mg by mouth at bedtime.   Yes Historical Provider, MD  rosuvastatin (CRESTOR) 40 MG tablet Take 1 tablet (40 mg total) by mouth daily. 08/28/15  Yes Patrecia Pour, MD  sertraline (ZOLOFT) 100 MG tablet Take 100 mg by mouth daily.   Yes Historical Provider, MD  spironolactone (ALDACTONE) 50 MG tablet Take 50 mg by mouth daily. 04/03/13  Yes Historical Provider, MD  traZODone (DESYREL) 100 MG tablet Take 100 mg by mouth at bedtime.   Yes Historical Provider, MD  triamcinolone cream (KENALOG) 0.1 % Apply 1 application topically 2 (two) times daily. Patient taking differently: Apply 1 application topically 2 (two) times daily as needed (rash/ irritation).  08/28/15  Yes Patrecia Pour, MD  hydrOXYzine (ATARAX/VISTARIL) 10 MG tablet Take 1 tablet (10 mg total) by mouth at bedtime as needed for itching. Patient not taking: Reported on 10/24/2015 05/19/15   Virginia Crews, MD  pravastatin (PRAVACHOL) 40 MG tablet Take 1 tablet (40 mg total) by mouth daily. Patient not taking: Reported on 10/24/2015 09/05/15   Patrecia Pour, MD   BP 160/109 mmHg  Pulse 96  Resp 15  SpO2 100% Physical Exam  Constitutional: She is oriented to person, place, and time. She  appears well-developed and well-nourished.  HENT:  Head: Normocephalic and atraumatic.  Right Ear: External ear normal.  Left Ear: External ear normal.  Nose: Nose normal.  Mouth/Throat: Oropharynx is clear and moist.  Eyes: Conjunctivae and EOM are normal. Pupils are equal, round, and reactive to light.  Neck: Normal range of motion. Neck supple.  Cardiovascular: Normal rate, regular rhythm, normal heart sounds and intact distal pulses.   Pulmonary/Chest: Effort normal and breath sounds normal.  Abdominal: Soft. Bowel sounds are normal.  Musculoskeletal: Normal range of motion.  Neurological: She is alert and oriented to person, place, and time.  Skin: Skin is warm and dry.  Psychiatric: She has a normal mood and affect. Her behavior is normal. Judgment and thought content normal.  Nursing note and vitals reviewed.   ED Course  Procedures (including critical care time) Labs Review Labs Reviewed  BASIC METABOLIC PANEL  CBC  TSH  TROPONIN I  Randolm Idol, ED    Imaging Review Dg Chest 2 View  10/24/2015  CLINICAL DATA:  Middle chest pain today. History of tachycardia, MI. EXAM: CHEST  2 VIEW COMPARISON:  Chest x-rays dated 04/11/2014 and 10/14/2012. FINDINGS: Cardiomediastinal silhouette is normal in size and configuration. Lungs are clear. Lung volumes are normal. No evidence of pneumonia. No pleural effusion. No pneumothorax. Osseous and soft tissue structures about the chest are unremarkable. Again noted is mild scoliosis of the thoracolumbar spine. IMPRESSION: Lungs are clear and there is no evidence of acute cardiopulmonary abnormality. Electronically Signed   By: Franki Cabot M.D.   On: 10/24/2015 13:54   Ct Angio Chest Pe W/cm &/or Wo Cm  10/24/2015  CLINICAL DATA:  Chest pain. EXAM: CT ANGIOGRAPHY CHEST WITH CONTRAST TECHNIQUE: Multidetector CT imaging of the chest was performed using the standard protocol during bolus administration of intravenous contrast. Multiplanar  CT image reconstructions and MIPs were obtained to evaluate the vascular anatomy. CONTRAST:  100 cc Isovue 370 IV. COMPARISON:  04/12/2014 chest CT angiogram.  Chest radiograph from earlier today. FINDINGS: Mediastinum/Nodes: The study is high quality for the evaluation of pulmonary embolism. There are no filling defects in the central, lobar, segmental or subsegmental pulmonary artery branches to suggest acute pulmonary embolism. Great vessels are normal in course and caliber. Normal heart size. No significant pericardial fluid/thickening. Atrophic appearing thyroid. Normal esophagus. No pathologically enlarged axillary, mediastinal or hilar lymph nodes. Lungs/Pleura: No pneumothorax. No pleural effusion. No acute consolidative airspace disease, significant pulmonary nodules or lung masses. Upper abdomen: Tiny hiatal hernia. Musculoskeletal: No aggressive appearing focal osseous lesions. Mild degenerative changes in the thoracic spine. Review of the MIP images confirms the above findings. IMPRESSION: No pulmonary embolism.  No active cardiopulmonary disease. Tiny hiatal hernia. Electronically Signed   By: Ilona Sorrel M.D.   On: 10/24/2015 15:33   I have personally reviewed and evaluated these images and lab results as part of my medical decision-making.   EKG Interpretation   Date/Time:  Friday Oct 24 2015 13:17:46 EDT Ventricular Rate:  112 PR Interval:  170 QRS Duration: 92 QT Interval:  336 QTC Calculation: 459 R Axis:   51 Text Interpretation:  Sinus tachycardia Borderline T wave abnormalities  Confirmed by Nkechi Linehan MD, Kealey Kemmer ([id number]) on 10/24/2015 1:41:21 PM      MDM  NL CATH LESS THAN 1 YEAR AGO.  2 SETS OF NEGATIVE TROPONINS.  NL CT CHEST.  PT OK FOR D/C. Final diagnoses:  Chest pain, unspecified chest pain type        Isla Pence, MD 10/24/15 1702

## 2015-10-24 NOTE — Discharge Instructions (Signed)
Chest Wall Pain °Chest wall pain is pain in or around the bones and muscles of your chest. Sometimes, an injury causes this pain. Sometimes, the cause may not be known. This pain may take several weeks or longer to get better. °HOME CARE °Pay attention to any changes in your symptoms. Take these actions to help with your pain: °· Rest as told by your doctor. °· Avoid activities that cause pain. Try not to use your chest, belly (abdominal), or side muscles to lift heavy things. °· If directed, apply ice to the painful area: °¨ Put ice in a plastic bag. °¨ Place a towel between your skin and the bag. °¨ Leave the ice on for 20 minutes, 2-3 times per day. °· Take over-the-counter and prescription medicines only as told by your doctor. °· Do not use tobacco products, including cigarettes, chewing tobacco, and e-cigarettes. If you need help quitting, ask your doctor. °· Keep all follow-up visits as told by your doctor. This is important. °GET HELP IF: °· You have a fever. °· Your chest pain gets worse. °· You have new symptoms. °GET HELP RIGHT AWAY IF: °· You feel sick to your stomach (nauseous) or you throw up (vomit). °· You feel sweaty or light-headed. °· You have a cough with phlegm (sputum) or you cough up blood. °· You are short of breath. °  °This information is not intended to replace advice given to you by your health care provider. Make sure you discuss any questions you have with your health care provider. °  °Document Released: 11/10/2007 Document Revised: 02/12/2015 Document Reviewed: 08/19/2014 °Elsevier Interactive Patient Education ©2016 [employer] ° °

## 2015-10-30 ENCOUNTER — Ambulatory Visit (INDEPENDENT_AMBULATORY_CARE_PROVIDER_SITE_OTHER): Payer: Medicaid Other | Admitting: Family Medicine

## 2015-10-30 ENCOUNTER — Other Ambulatory Visit: Payer: Self-pay | Admitting: Family Medicine

## 2015-10-30 ENCOUNTER — Encounter: Payer: Self-pay | Admitting: Family Medicine

## 2015-10-30 VITALS — BP 154/112 | HR 121 | Temp 98.1°F | Ht 64.0 in | Wt 138.8 lb

## 2015-10-30 DIAGNOSIS — N644 Mastodynia: Secondary | ICD-10-CM

## 2015-10-30 DIAGNOSIS — I10 Essential (primary) hypertension: Secondary | ICD-10-CM

## 2015-10-30 DIAGNOSIS — G43009 Migraine without aura, not intractable, without status migrainosus: Secondary | ICD-10-CM | POA: Diagnosis not present

## 2015-10-30 DIAGNOSIS — L299 Pruritus, unspecified: Secondary | ICD-10-CM

## 2015-10-30 MED ORDER — LINACLOTIDE 290 MCG PO CAPS: 290.0000 ug | ORAL_CAPSULE | Freq: Every day | ORAL | Status: DC

## 2015-10-30 MED ORDER — TRIAMCINOLONE ACETONIDE 0.1 % EX CREA: 1.0000 "application " | TOPICAL_CREAM | Freq: Two times a day (BID) | CUTANEOUS | Status: DC | PRN

## 2015-10-30 NOTE — Patient Instructions (Addendum)
Thank you for coming in today!  - We will schedule a diagnostic mammogram for you and follow up with you regarding the results.  - You are due to go back to see the eye doctor. They may be able to help with the pain behind your eyes.  - Please make sure you always take all your medications, as your blood pressure is very high right now.   Our clinic's number is [phone]. Feel free to call any time with questions or concerns. We will answer any questions after hours with our 24-hour emergency line at that number as well.   - Dr. Bonner Puna

## 2015-10-30 NOTE — Assessment & Plan Note (Signed)
Without suspicious finding on exam. Diffuse tenderness to chest wall palpation as well, but also due for screening mammogram. Per protocol, due to any breast pain, a diagnostic mammogram will be ordered.

## 2015-10-30 NOTE — Assessment & Plan Note (Addendum)
Possibly underlying issue worsening due to refractive error. No red flags by history or exam today. Advised to return to optometrist for evaluation.

## 2015-10-30 NOTE — Assessment & Plan Note (Signed)
Remains noncompliant. Reviewed indications for medications and risks of long-term uncontrolled HTN. Will not augment medications, just ask to restart.

## 2015-10-30 NOTE — Progress Notes (Signed)
Subjective: [name] Rowe is a 55 y.o. female presenting for headache and breast pain.   She reports pain in her left breast when pressing on it for several weeks. No pain otherwise. Does not do self breast exams, but denies noticing visual or palpable abnormality. She was scheduled for screening mammography, but reported these symptoms, and was referred here for further evaluation. Denies redness, palpable abnormality, nipple skin change or discharge. No fevers.   - ROS: As above - No history of breast cancer or FH of breast or ovarian CA.  - Non-smoker  Objective: BP 154/112 mmHg  Pulse 121  Temp(Src) 98.1 F (36.7 C) (Oral)  Ht 5\' 4"  (1.626 m)  Wt 138 lb 12.8 oz (62.959 kg)  BMI 23.81 kg/m2 Gen: Well-appearing 55 y.o. female in no distress Breasts: Normal by inspection bilaterally. Breasts generally dense for age. Left breast without palpable nodules or cysts but with considerable tenderness to palpation localized by patient to approximately 4 o'clock ~ 2 - 4 cm from areola complex which is normal with sparse hair growth and no discharge. Right breast without palpable abnormality but tenderness diffusely at chest wall, worst around 5 o'clock approximately 5cm from areola complex. Areola complex normal with sparse hair growth and no discharge.  Screening mammogram 12/25/2012: BiRADS 1.   Assessment/Plan: [name] Rowe is a 55 y.o. female here for breast tenderness. Migraine headache Possibly underlying issue worsening due to refractive error. No red flags by history or exam today. Advised to return to optometrist for evaluation.   Essential hypertension Remains noncompliant. Reviewed indications for medications and risks of long-term uncontrolled HTN. Will not augment medications, just ask to restart.   Pain of left breast Without suspicious finding on exam. Diffuse tenderness to chest wall palpation as well, but also due for screening mammogram. Per protocol, due to any breast  pain, a diagnostic mammogram will be ordered.

## 2015-11-07 ENCOUNTER — Ambulatory Visit
Admission: RE | Admit: 2015-11-07 | Discharge: 2015-11-07 | Disposition: A | Discharge status: Home / Self Care | Payer: Medicaid Other | Source: Ambulatory Visit | Attending: Family Medicine | Admitting: Family Medicine

## 2015-11-07 DIAGNOSIS — N644 Mastodynia: Secondary | ICD-10-CM

## 2015-11-12 ENCOUNTER — Other Ambulatory Visit: Payer: Self-pay | Admitting: Neurosurgery

## 2015-11-12 DIAGNOSIS — M5441 Lumbago with sciatica, right side: Secondary | ICD-10-CM

## 2015-11-28 ENCOUNTER — Ambulatory Visit
Admission: RE | Admit: 2015-11-28 | Discharge: 2015-11-28 | Disposition: A | Discharge status: Home / Self Care | Payer: Medicaid Other | Source: Ambulatory Visit | Attending: Neurosurgery | Admitting: Neurosurgery

## 2015-11-28 DIAGNOSIS — M5441 Lumbago with sciatica, right side: Secondary | ICD-10-CM

## 2015-11-28 MED ORDER — GADOBENATE DIMEGLUMINE 529 MG/ML IV SOLN
12.0000 mL | Freq: Once | INTRAVENOUS | Status: AC | PRN
  Administered 2015-11-28: 12 mL via INTRAVENOUS

## 2016-02-11 ENCOUNTER — Other Ambulatory Visit: Payer: Self-pay | Admitting: Neurosurgery

## 2016-02-11 DIAGNOSIS — M5412 Radiculopathy, cervical region: Secondary | ICD-10-CM

## 2016-02-19 ENCOUNTER — Ambulatory Visit
Admission: RE | Admit: 2016-02-19 | Discharge: 2016-02-19 | Disposition: A | Discharge status: Home / Self Care | Payer: Medicaid Other | Source: Ambulatory Visit | Attending: Neurosurgery | Admitting: Neurosurgery

## 2016-02-19 DIAGNOSIS — M5412 Radiculopathy, cervical region: Secondary | ICD-10-CM

## 2016-02-26 ENCOUNTER — Other Ambulatory Visit (HOSPITAL_COMMUNITY): Payer: Self-pay | Admitting: Neurosurgery

## 2016-02-26 ENCOUNTER — Other Ambulatory Visit: Payer: Self-pay | Admitting: Neurosurgery

## 2016-02-26 DIAGNOSIS — M5441 Lumbago with sciatica, right side: Secondary | ICD-10-CM

## 2016-03-16 ENCOUNTER — Ambulatory Visit (INDEPENDENT_AMBULATORY_CARE_PROVIDER_SITE_OTHER): Payer: Medicaid Other | Admitting: Student

## 2016-03-16 ENCOUNTER — Other Ambulatory Visit: Payer: Self-pay | Admitting: Student

## 2016-03-16 ENCOUNTER — Encounter: Payer: Self-pay | Admitting: Student

## 2016-03-16 VITALS — BP 166/103 | HR 108 | Temp 98.5°F | Wt 141.0 lb

## 2016-03-16 DIAGNOSIS — M546 Pain in thoracic spine: Secondary | ICD-10-CM | POA: Insufficient documentation

## 2016-03-16 DIAGNOSIS — I10 Essential (primary) hypertension: Secondary | ICD-10-CM | POA: Diagnosis not present

## 2016-03-16 DIAGNOSIS — Z23 Encounter for immunization: Secondary | ICD-10-CM | POA: Diagnosis not present

## 2016-03-16 DIAGNOSIS — M542 Cervicalgia: Secondary | ICD-10-CM | POA: Insufficient documentation

## 2016-03-16 DIAGNOSIS — Z0000 Encounter for general adult medical examination without abnormal findings: Secondary | ICD-10-CM | POA: Diagnosis not present

## 2016-03-16 LAB — BASIC METABOLIC PANEL WITH GFR
BUN: 16 mg/dL (ref 7–25)
CALCIUM: 9.4 mg/dL (ref 8.6–10.4)
CO2: 27 mmol/L (ref 20–31)
Chloride: 102 mmol/L (ref 98–110)
Creat: 1.05 mg/dL (ref 0.50–1.05)
GFR, EST AFRICAN AMERICAN: 70 mL/min (ref >=60)
GFR, Est Non African American: 60 mL/min (ref >=60)
GLUCOSE: 95 mg/dL (ref 65–99)
Potassium: 4.3 mmol/L (ref 3.5–5.3)
Sodium: 137 mmol/L (ref 135–146)

## 2016-03-16 LAB — TSH: TSH: 0.09 mIU/L — ABNORMAL LOW

## 2016-03-16 MED ORDER — CYCLOBENZAPRINE HCL 10 MG PO TABS: 10.0000 mg | ORAL_TABLET | Freq: Three times a day (TID) | ORAL | 0 refills | Status: DC | PRN

## 2016-03-16 MED ORDER — CHLORTHALIDONE 25 MG PO TABS
25.0000 mg | ORAL_TABLET | Freq: Every day | ORAL | 0 refills | Status: DC
Start: 2016-03-16 — End: 2016-03-22

## 2016-03-16 NOTE — Patient Instructions (Addendum)
It was great seeing you today! The pain is likely a muscle spasm. I have sent a prescription for Flexeril to the pharmacy. I recommend taking this medicine at bedtime as it can cause sedation or drowsiness. I recommend not driving or operating machinery while on this medication. I also recommend trying Tylenol 500 mg up to 4 times a day as needed for pain. I also recommend you continue using heating pad. If no improvement in the pain over the next 3-4 days or if your symptoms are worse or you have other symptoms concerning to please come back and see [place].  Blood pressure: Your blood pressure is high today. Your goal blood pressure is less than 140/90. I recommend you come back and see [place] for your blood pressure as soon as possible.    If we did any lab work today, and the results require attention, either me or my nurse will get in touch with you. If everything is normal, you will get a letter in mail. If you don't hear from [place] in two weeks, please give [place] a call. Otherwise, I look forward to talking with you again at our next visit. If you have any questions or concerns before then, please call the clinic at [phone].  Please bring all your medications to every doctors visit   Sign up for My Chart to have easy access to your labs results, and communication with your Primary care physician.    Please check-out at the front desk before leaving the clinic.   Take Care,

## 2016-03-16 NOTE — Assessment & Plan Note (Signed)
Received a flu shot today 

## 2016-03-16 NOTE — Assessment & Plan Note (Addendum)
Blood pressure elevated to 166/103 on arrival. Repeat blood pressure 162/104. She is also tachycardic to 108. Otherwise, no chest pain, shortness of breath or palpitation. Neurologically intact. She brought the medication list to the clinic today. However, the list doesn't much our list here. She reports compliance with her medication. She reports taking her medication this morning. -BMP today -TSH -Follow up as soon as possible. I asked her to bring him medication bottles to her next visit.

## 2016-03-16 NOTE — Assessment & Plan Note (Signed)
Tenderness over paraspinal muscles suggestive for muscle spasm. No red flags except the age. She already have a follow-up with neurosurgeon scheduled in 10 days.  -Pain management as above

## 2016-03-16 NOTE — Assessment & Plan Note (Signed)
Likely musculoskeletal. She is tender to palpation over her sternocleidomastoid muscles bilaterally. Voice changes likely viral laryngitis. Otherwise, neurologic exam is within normal. -Gave prescription for Flexeril 10 mg at bedtime as needed for pain, quantity 10. Discussed about the side effect as well -Recommended Tylenol 500 mg up to 4 times a day as needed for pain as well as heating pad -Return to clinic if no improvement over the next 4-5 days or symptoms are worse.

## 2016-03-16 NOTE — Progress Notes (Signed)
Subjective:    Patient ID: [name] Rowe is a 55 y.o. old female.  HPI #Neck pain: this has been going on for one month. She feels her neck is swollen. This morning, she met her friend who asked her about her voice which prompted her to make this schedule.  She describes the neck pain as sore. Pain is bilateral. The pain is always there. Tried heat which didn't help. Didn't take medicine. Never had pain like this before. Deneis runny nose, congestion, cough, fever, chills, shortness of breath or neurologic symptoms in the arms or face. Reports chest pain when she drinks water. She has history of graves disease. She takes synthorid. Denies history of radiation to the neck. She was told by a doctor she had bone spur in her neck.   #Left Back Pain: has been going on for one week. She has low back pain for 10 years but this pain is higher than her usual pain. Had three back surgeries. She has instrumentation. Last surgery was in 2014. Describes her pain as pinching. No radiation, urinary or fecal incontinence, fever, night sweat and unintentional weight loss. She has follow up with NS scheduled on 03/26/2016.   #Hypertension: She brought the medication list to the clinic today. Her medication list doesn't much our list here. She reports compliance with her medication. She reports taking her medication this morning. She denies shortness of breath, chest pain or swelling in her legs.   PMH: reviewed  SH: Denies smoking cigarettes, alcohol, recreational drug use  Review of Systems Per HPI Objective:   Vitals:   03/16/16 1612  BP: (!) 166/103  Pulse: (!) 108  Temp: 98.5 F (36.9 C)  TempSrc: Oral  Weight: 141 lb (64 kg)    GEN: appears well, no apparent distress. Oropharynx: mmm without erythema or exudation CVS: RRR, normal s1 and s2, no murmurs, no edema RESP: no increased work of breathing, good air movement bilaterally, no crackles or wheeze GI: Bowel sounds present and normal,  soft, non-tender,non-distended MSK:No apparent swelling or skin erythema over her neck. Full range of motion in neck. She is tender to palpation over her sternocleidomastoid muscles bilaterally. She is also tender to palpation over left paraspinal muscles at lower thoracic region. Negative CVA tenderness.  ENDO: No palpable thyromegaly HEM: No cervical or periauricular lymphadenopathy NEURO: alert and oriented appropriately, no gross defecits  PSYCH: appropriate mood and affect     Assessment & Plan:  Neck pain Likely musculoskeletal. She is tender to palpation over her sternocleidomastoid muscles bilaterally. Voice changes likely viral laryngitis. Otherwise, neurologic exam is within normal. -Gave prescription for Flexeril 10 mg at bedtime as needed for pain, quantity 10. Discussed about the side effect as well -Recommended Tylenol 500 mg up to 4 times a day as needed for pain as well as heating pad -Return to clinic if no improvement over the next 4-5 days or symptoms are worse.  Acute left-sided thoracic back pain Tenderness over paraspinal muscles suggestive for muscle spasm. No red flags except the age. She already have a follow-up with neurosurgeon scheduled in 10 days.  -Pain management as above  Essential hypertension Blood pressure elevated to 166/103 on arrival. Repeat blood pressure 162/104. She is also tachycardic to 108. Otherwise, no chest pain, shortness of breath or palpitation. Neurologically intact. She brought the medication list to the clinic today. However, the list doesn't much our list here. She reports compliance with her medication. She reports taking her medication this morning. -BMP  today -TSH -Follow up as soon as possible. I asked her to bring him medication bottles to her next visit.  Routine adult health maintenance Received a flu shot today

## 2016-03-18 LAB — T4, FREE: FREE T4: 1.6 ng/dL (ref 0.8–1.8)

## 2016-03-19 ENCOUNTER — Telehealth: Payer: Self-pay | Admitting: Student

## 2016-03-19 LAB — T3: T3 TOTAL: 101 ng/dL (ref 76–181)

## 2016-03-19 NOTE — Telephone Encounter (Signed)
Attempted to call patient about her low TSH. Patient did not pickup the phone. She has no voicemail either. Patient has an appointment with me in 3 days for follow-up on blood pressure and tachycardia. I sent her my chart message that we will discuss about her TSH at that visit. Her free T4 and total T3 are within normal limit. We may consider reducing his Synthroid by 15-25 g and repeat TSH in 5-6 weeks

## 2016-03-22 ENCOUNTER — Ambulatory Visit (INDEPENDENT_AMBULATORY_CARE_PROVIDER_SITE_OTHER): Payer: Medicaid Other | Admitting: Student

## 2016-03-22 ENCOUNTER — Encounter: Payer: Self-pay | Admitting: Student

## 2016-03-22 VITALS — BP 126/88 | HR 113 | Temp 98.5°F | Ht 64.0 in | Wt 144.4 lb

## 2016-03-22 DIAGNOSIS — I10 Essential (primary) hypertension: Secondary | ICD-10-CM | POA: Diagnosis not present

## 2016-03-22 DIAGNOSIS — E038 Other specified hypothyroidism: Secondary | ICD-10-CM | POA: Diagnosis not present

## 2016-03-22 DIAGNOSIS — R079 Chest pain, unspecified: Secondary | ICD-10-CM | POA: Diagnosis not present

## 2016-03-22 DIAGNOSIS — I201 Angina pectoris with documented spasm: Secondary | ICD-10-CM | POA: Diagnosis not present

## 2016-03-22 MED ORDER — CHLORTHALIDONE 25 MG PO TABS: 25.0000 mg | ORAL_TABLET | Freq: Every day | ORAL | 3 refills | Status: DC

## 2016-03-22 MED ORDER — LEVOTHYROXINE SODIUM 125 MCG PO TABS: 125.0000 ug | ORAL_TABLET | Freq: Every day | ORAL | 3 refills | Status: DC

## 2016-03-22 MED ORDER — NIFEDIPINE ER OSMOTIC RELEASE 90 MG PO TB24: 90.0000 mg | ORAL_TABLET | Freq: Every day | ORAL | 1 refills | Status: DC

## 2016-03-22 MED ORDER — LABETALOL HCL 200 MG PO TABS: 200.0000 mg | ORAL_TABLET | Freq: Two times a day (BID) | ORAL | 0 refills | Status: DC

## 2016-03-22 MED ORDER — NITROGLYCERIN 0.4 MG SL SUBL: 0.4000 mg | SUBLINGUAL_TABLET | SUBLINGUAL | 3 refills | Status: DC | PRN

## 2016-03-22 NOTE — Assessment & Plan Note (Signed)
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 LHC without significant CAD.  -Refilled her nitroglycerin today

## 2016-03-22 NOTE — Assessment & Plan Note (Signed)
Patient with symptoms of hyperthyroidism. She is tachycardic to 113 today. TSH 0.09 about a week ago. Free T4 and total T3 within normal limits.  -Reduce Synthroid from 150 to 125 g daily  -Advised patient to schedule lab visit for TSH in 5 weeks

## 2016-03-22 NOTE — Patient Instructions (Addendum)
It was great seeing you today! We have addressed the following issues today  1. Blood pressure: Your goal blood pressure is less than 140/90. I have refilled her prescription for labetalol 200 mg twice a day and chlorthalidone. I recommend follow-up in 2 weeks. 2.   Thyroid: I have reduced your Synthroid to 125 g daily. Please schedule a lab visit in about 5 weeks to have you thyroid checked.   If we did any lab work today, and the results require attention, either me or my nurse will get in touch with you. If everything is normal, you will get a letter in mail. If you don't hear from [place] in two weeks, please give [place] a call. Otherwise, we look forward to seeing you again at your next visit. If you have any questions or concerns before then, please call the clinic at [phone].   Please bring all your medications to every doctors visit   Sign up for My Chart to have easy access to your labs results, and communication with your Primary care physician.     Please check-out at the front desk before leaving the clinic.    Take Care,

## 2016-03-22 NOTE — Progress Notes (Signed)
   Subjective:    Patient ID: [name] Rowe is a 55 y.o. old female.  HPI Hypertension: Checks her blood pressure at home: Highest for systolic in [id number]. Highest for diastolic in 0000000. She is on nifedipine XL 90 mg daily, labetalol 800 mg twice a day and chlorthalidone 25 mg daily. However, patient has been taking 200 mg twice a day of labetalol for almost a month.  Didn't take her blood pressure medicine today because she was out of her blood pressure medications. Last dose was yesterday.  She says she used to see  Dr. Criss Rosales for hypertension for is not refilling her blood pressure medications any more. Patient also have history of fibromuscular dysplasia status post stent placement in 2009.  Hypothyroidism: She has history of hypothyroidism and she is on Synthroid 150 g daily. Her TSH about 6 days ago was low at 0.09. Total T3 and free T4 was within normal limits. She reports feeling hot.   Chest pain: reports history of nocturnal chest pain. No recent changes. She denies orthopnea, shortness of breath or edema. She had LHC about a year ago which was remarkable only for 40-50% stenosis in LAD. There was a concern for vasospasm and that time and she was started on nitroglycerin. She stated that nitroglycerin is helping with the chest pain. She sees Dr. Aundra Dubin from cardiology.   PMH: reviewed  SH: Denies smoking or recreational drug use.  Review of Systems Per HPI Objective:   Vitals:   03/22/16 0953 03/22/16 1035  BP: (!) 131/95 126/88  Pulse: (!) 113   Temp: 98.5 F (36.9 C)   TempSrc: Oral   Weight: 144 lb 6.4 oz (65.5 kg)   Height: 5\' 4"  (1.626 m)     GEN: appears well, no apparent distress. CVS: RRR, normal s1 and s2, no murmurs, no edema, negative for carotid or abdominal bruits, dominant posterior tibialis bilaterally.  RESP: no increased work of breathing, good air movement bilaterally, no crackles or wheeze GI: Bowel sounds present and normal, soft,  non-tender,non-distended ENDO: Negative for thyromegaly NEURO: alert and oriented appropriately, no gross defecits  PSYCH: appropriate mood and affect     Assessment & Plan:  Essential hypertension Blood pressure 131/95 on arrival. Repeat blood pressure 126/88. She has not taken her blood pressure medications this morning. Given normal range blood pressure, I decided to continue labetalol at 200 mg twice a day (in the state of 800 mg twice a day), which she has been doing for the last 1 months. Her thyroid issue can have some roles to play as well. She also have history of fibromuscular dysplasia doctors for stent placement in 2009 but exam is reassuring. BMP about a week ago within normal limits.  -Labetalol 200 mg twice a day given tachycardia -Nifedipine 90 mg XL daily -Chlorthalidone 25 mg daily -Continue checking blood pressure at home 2-3 times a week.  -Consider ambulatory BP monitoring if discrepancy between home and office blood pressures continues -Follow up in 2 weeks  Hypothyroidism Patient with symptoms of hyperthyroidism. She is tachycardic to 113 today. TSH 0.09 about a week ago. Free T4 and total T3 within normal limits.  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 LHC without significant CAD.  -Refilled her nitroglycerin today

## 2016-03-22 NOTE — Assessment & Plan Note (Addendum)
Blood pressure 131/95 on arrival. Repeat blood pressure 126/88. She has not taken her blood pressure medications this morning. Given normal range blood pressure, I decided to continue labetalol at 200 mg twice a day (in the state of 800 mg twice a day), which she has been doing for the last 1 months. Her thyroid issue can have some roles to play as well. She also have history of fibromuscular dysplasia doctors for stent placement in 2009 but exam is reassuring. BMP about a week ago within normal limits.  -Labetalol 200 mg twice a day given tachycardia -Nifedipine 90 mg XL daily -Chlorthalidone 25 mg daily -Continue checking blood pressure at home 2-3 times a week.  -Consider ambulatory BP monitoring if discrepancy between home and office blood pressures continues -Follow up in 2 weeks

## 2016-03-26 ENCOUNTER — Ambulatory Visit (HOSPITAL_COMMUNITY): Payer: Medicaid Other

## 2016-04-06 ENCOUNTER — Encounter: Payer: Self-pay | Admitting: Student

## 2016-04-06 ENCOUNTER — Ambulatory Visit (INDEPENDENT_AMBULATORY_CARE_PROVIDER_SITE_OTHER): Payer: Medicaid Other | Admitting: Student

## 2016-04-06 VITALS — BP 120/84 | HR 101 | Temp 98.3°F | Ht 64.0 in | Wt 142.0 lb

## 2016-04-06 DIAGNOSIS — I10 Essential (primary) hypertension: Secondary | ICD-10-CM

## 2016-04-06 DIAGNOSIS — K219 Gastro-esophageal reflux disease without esophagitis: Secondary | ICD-10-CM | POA: Diagnosis not present

## 2016-04-06 DIAGNOSIS — R5382 Chronic fatigue, unspecified: Secondary | ICD-10-CM | POA: Diagnosis not present

## 2016-04-06 DIAGNOSIS — R5383 Other fatigue: Secondary | ICD-10-CM

## 2016-04-06 LAB — CBC WITH DIFFERENTIAL/PLATELET
BASOS ABS: 0 {cells}/uL (ref 0–200)
Basophils Relative: 0 %
EOS PCT: 3 %
Eosinophils Absolute: 189 cells/uL (ref 15–500)
HCT: 40 % (ref 35.0–45.0)
Hemoglobin: 14 g/dL (ref 11.7–15.5)
LYMPHS PCT: 23 %
Lymphs Abs: 1449 cells/uL (ref 850–3900)
MCH: 29.7 pg (ref 27.0–33.0)
MCHC: 35 g/dL (ref 32.0–36.0)
MCV: 84.9 fL (ref 80.0–100.0)
MONOS PCT: 7 %
MPV: 9.2 fL (ref 7.5–12.5)
Monocytes Absolute: 441 cells/uL (ref 200–950)
NEUTROS PCT: 67 %
Neutro Abs: 4221 cells/uL (ref 1500–7800)
PLATELETS: 332 10*3/uL (ref 140–400)
RBC: 4.71 MIL/uL (ref 3.80–5.10)
RDW: 13.3 % (ref 11.0–15.0)
WBC: 6.3 10*3/uL (ref 3.8–10.8)

## 2016-04-06 MED ORDER — CALCIUM CARBONATE-VITAMIN D3 600-400 MG-UNIT PO TABS: ORAL_TABLET | ORAL | 3 refills | Status: DC

## 2016-04-06 MED ORDER — PANTOPRAZOLE SODIUM 40 MG PO TBEC: 40.0000 mg | DELAYED_RELEASE_TABLET | Freq: Every day | ORAL | 3 refills | Status: DC

## 2016-04-06 NOTE — Patient Instructions (Addendum)
It was great seeing you today! We have addressed the following issues today  1. Fatigue: of no clear explanation why you have this symptom. I'll be doing some tests today. I also recommend using the sleep hygiene handout I gave you and see if this improves your symptoms. I will review your colonoscopy and pathology results and get in touch with you. Please take the prescription for vitamin D/calcium to your pharmacy and start taking this.   2.   Blood pressure: your blood pressure is within normal limits today. I reommond follow up in one month when you    come back for your thyroid check.   3.   Heartburn: I have sent a prescription for Protonix to your pharmacy.    If we did any lab work today, and the results require attention, either me or my nurse will get in touch with you. If everything is normal, you will get a letter in mail. If you don't hear from [place] in two weeks, please give [place] a call. Otherwise, we look forward to seeing you again at your next visit. If you have any questions or concerns before then, please call the clinic at [phone].   Please bring all your medications to every doctors visit   Sign up for My Chart to have easy access to your labs results, and communication with your Primary care physician.     Please check-out at the front desk before leaving the clinic.    Take Care,

## 2016-04-06 NOTE — Progress Notes (Signed)
Subjective:    Patient ID: [name] Rowe is a 55 y.o. old female.   Patient here for follow-up on hypertension. She also likes to discuss about fatigue.   HPI   #Fatigue: for years but has gotten worse over the last one month. She feels tired two to three time a week. It lasts the whole day when it happens. She reports going to bed between 10 and 11 pm everyday. She reports frequent awakening at night, about every 30 minutes. She denies daytime sleepiness although she feels tired. She denies choking, shortness of breath or orthopnea. Reports heartburn. Reports dark stool for months. Denies dizziness, fever, chills, unintentional weight loss or night sweats. She had colonoscopy on 02/16/2013 which is remarkable for a  diminutive sessile polyp that was completely resected and retrieved. 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 Per [employer] further testing should be based on doctor's judgment If the entire adenoma has been removed I retrieved completely.   #Hypertension: controlled. She reports good compliance with her medications. She denies side effects. Patient is on Nifedipine XL 90 mg daily, labetalol 200 mg twice a day and chlorthalidone 25 mg daily. These are second line treatments for patient with fibromuscular dysplasia if blood pressure is not controlled on Ace inhibitors. However, patient is not on any ACE inhibitor when she transferred her care to [place].    PMH: remarkable for fibromuscular dysplasia, hypothyroidism, depression and anxiety  SH: denies smoking or recreational drug use. Reports occasional drinking  Review of Systems Per HPI Objective:   Vitals:   04/06/16 0836  BP: 120/84  Pulse: (!) 101  Temp: 98.3 F (36.8 C)  TempSrc: Oral  SpO2: 98%  Weight: 142 lb (64.4 kg)  Height: 5\' 4"  (1.626 m)    GEN: appears well, no apparent distress. Head: normocephalic and atraumatic  Eyes: without conjunctival injection, sclera anicteric Oropharynx:  mmm without erythema or exudation HEM: negative for cervical lymphadenopathy CVS: RRR, normal s1 and s2, no murmurs, no edema RESP: no increased work of breathing, good air movement bilaterally, no crackles or wheeze GI: Bowel sounds present and normal, soft, diffusely tender over 4 quadrants, non-distended SKIN: no apparent skin lesion ENDO: negative for thyromegaly NEURO: alert and oriented appropriately, no gross defecits  PSYCH: a little bit anxious. Describes his mood as good. Denies anhedonia.     Assessment & Plan:  Fatigue Unclear etiology for her fatigue at this point. She has multiple risk factors including depression, hypothyroidism, medication (beta blocker, Seroquel, chlorthalidone), anemia (history of dark stool), micronutrients deficiency, poor sleep or sleep apnea. We cut down on his Synthroid from 150 to 125 due to low TSH with normal free T4 and T3. However, patient  Has had fatigue prior to that. Patient is on labetalol 200 mg twice a day. However,she was on 800 mg twice a day about a month ago. She is on chlorthalidone but her electrolyte is within normal limits about 2 weeks ago. Patient is on Zoloft 100 mg and Seroquel for depression. Is unclear to me if her depression is well controlled although she reports good mood. She also looks anxious. It might be a good idea to change her Zoloft to Cymbalta to address her anxiety and fibromyalgia as well. However, I would rule out other causes before making the change.  -CBC with differential. She reports history of melena -Gave handout for sleep hygiene -Calcium/vitamin D daily     Essential hypertension Controlled. She is on nifedipine XL 90 mg daily,  labetalol 200 mg twice a day and chlorthalidone 25 mg daily. Labetalol could help with tachycardia as well. However, it is unclear to me why she is on nifedipine and chlorthalidone versus ACE inhibitor, which is a first-line blood pressure medication in patients with fibromuscular  dysplasia/renal artery stenosis. Given her blood pressure is well controlled right now, I will keep her medications as they are. BMP was within normal limits weeks ago. Follow-up in one month. We will recheck a TSH at that visit

## 2016-04-07 DIAGNOSIS — R5383 Other fatigue: Secondary | ICD-10-CM | POA: Insufficient documentation

## 2016-04-07 NOTE — Assessment & Plan Note (Signed)
Unclear etiology for her fatigue at this point. She has multiple risk factors including depression, hypothyroidism, medication (beta blocker, Seroquel, chlorthalidone), anemia (history of dark stool), micronutrients deficiency, poor sleep or sleep apnea. We cut down on his Synthroid from 150 to 125 due to low TSH with normal free T4 and T3. However, patient  Has had fatigue prior to that. Patient is on labetalol 200 mg twice a day. However,she was on 800 mg twice a day about a month ago. She is on chlorthalidone but her electrolyte is within normal limits about 2 weeks ago. Patient is on Zoloft 100 mg and Seroquel for depression. Is unclear to me if her depression is well controlled although she reports good mood. She also looks anxious. It might be a good idea to change her Zoloft to Cymbalta to address her anxiety and fibromyalgia as well. However, I would rule out other causes before making the change.  -CBC with differential. She reports history of melena -Gave handout for sleep hygiene -Calcium/vitamin D daily

## 2016-04-07 NOTE — Assessment & Plan Note (Signed)
Controlled. She is on nifedipine XL 90 mg daily, labetalol 200 mg twice a day and chlorthalidone 25 mg daily. Labetalol could help with tachycardia as well. However, it is unclear to me why she is on nifedipine and chlorthalidone versus ACE inhibitor, which is a first-line blood pressure medication in patients with fibromuscular dysplasia/renal artery stenosis. Given her blood pressure is well controlled right now, I will keep her medications as they are. BMP was within normal limits weeks ago. Follow-up in one month. We will recheck a TSH at that visit

## 2016-04-26 ENCOUNTER — Other Ambulatory Visit (INDEPENDENT_AMBULATORY_CARE_PROVIDER_SITE_OTHER): Payer: Medicaid Other

## 2016-04-26 DIAGNOSIS — E038 Other specified hypothyroidism: Secondary | ICD-10-CM

## 2016-04-26 LAB — TSH: TSH: 0.72 mIU/L

## 2016-05-04 ENCOUNTER — Other Ambulatory Visit: Payer: Self-pay | Admitting: *Deleted

## 2016-05-04 DIAGNOSIS — L299 Pruritus, unspecified: Secondary | ICD-10-CM

## 2016-05-04 MED ORDER — TRIAMCINOLONE ACETONIDE 0.1 % EX CREA: 1.0000 "application " | TOPICAL_CREAM | Freq: Two times a day (BID) | CUTANEOUS | 1 refills | Status: DC | PRN

## 2016-06-17 ENCOUNTER — Emergency Department (HOSPITAL_COMMUNITY): Payer: Medicaid Other

## 2016-06-17 ENCOUNTER — Encounter: Payer: Self-pay | Admitting: Internal Medicine

## 2016-06-17 ENCOUNTER — Ambulatory Visit (INDEPENDENT_AMBULATORY_CARE_PROVIDER_SITE_OTHER): Payer: Medicaid Other | Admitting: Internal Medicine

## 2016-06-17 ENCOUNTER — Encounter (HOSPITAL_COMMUNITY): Payer: Self-pay | Admitting: Emergency Medicine

## 2016-06-17 ENCOUNTER — Ambulatory Visit (HOSPITAL_COMMUNITY)
Admission: RE | Admit: 2016-06-17 | Discharge: 2016-06-17 | Disposition: A | Discharge status: Home / Self Care | Payer: Medicaid Other | Source: Ambulatory Visit | Attending: Family Medicine | Admitting: Family Medicine

## 2016-06-17 ENCOUNTER — Other Ambulatory Visit: Payer: Self-pay

## 2016-06-17 ENCOUNTER — Emergency Department (HOSPITAL_COMMUNITY)
Admission: EM | Admit: 2016-06-17 | Discharge: 2016-06-17 | Disposition: A | Discharge status: Home / Self Care | Payer: Medicaid Other | Attending: Emergency Medicine | Admitting: Emergency Medicine

## 2016-06-17 VITALS — BP 102/70 | HR 99 | Temp 98.3°F | Wt 141.0 lb

## 2016-06-17 DIAGNOSIS — E039 Hypothyroidism, unspecified: Secondary | ICD-10-CM | POA: Insufficient documentation

## 2016-06-17 DIAGNOSIS — Z8673 Personal history of transient ischemic attack (TIA), and cerebral infarction without residual deficits: Secondary | ICD-10-CM | POA: Insufficient documentation

## 2016-06-17 DIAGNOSIS — Z7982 Long term (current) use of aspirin: Secondary | ICD-10-CM | POA: Diagnosis not present

## 2016-06-17 DIAGNOSIS — Z79899 Other long term (current) drug therapy: Secondary | ICD-10-CM | POA: Diagnosis not present

## 2016-06-17 DIAGNOSIS — R079 Chest pain, unspecified: Secondary | ICD-10-CM | POA: Diagnosis not present

## 2016-06-17 DIAGNOSIS — I10 Essential (primary) hypertension: Secondary | ICD-10-CM | POA: Insufficient documentation

## 2016-06-17 DIAGNOSIS — I252 Old myocardial infarction: Secondary | ICD-10-CM | POA: Insufficient documentation

## 2016-06-17 LAB — BASIC METABOLIC PANEL
Anion gap: 8 (ref 5–15)
BUN: 18 mg/dL (ref 6–20)
CO2: 23 mmol/L (ref 22–32)
Calcium: 9.6 mg/dL (ref 8.9–10.3)
Chloride: 104 mmol/L (ref 101–111)
Creatinine, Ser: 1.1 mg/dL — ABNORMAL HIGH (ref 0.44–1.00)
GFR calc Af Amer: >60 mL/min (ref >60)
GFR calc non Af Amer: 56 mL/min — ABNORMAL LOW (ref >60)
Glucose, Bld: 120 mg/dL — ABNORMAL HIGH (ref 65–99)
Potassium: 4.7 mmol/L (ref 3.5–5.1)
Sodium: 135 mmol/L (ref 135–145)

## 2016-06-17 LAB — CBC
HCT: 41.5 % (ref 36.0–46.0)
Hemoglobin: 14.7 g/dL (ref 12.0–15.0)
MCH: 30.1 pg (ref 26.0–34.0)
MCHC: 35.4 g/dL (ref 30.0–36.0)
MCV: 84.9 fL (ref 78.0–100.0)
Platelets: 297 10*3/uL (ref 150–400)
RBC: 4.89 MIL/uL (ref 3.87–5.11)
RDW: 12.5 % (ref 11.5–15.5)
WBC: 4.8 10*3/uL (ref 4.0–10.5)

## 2016-06-17 LAB — I-STAT TROPONIN, ED: Troponin i, poc: 0 ng/mL (ref 0.00–0.08)

## 2016-06-17 NOTE — ED Triage Notes (Addendum)
Pt to ED via private vehicle with c/o left sided chest pain that started yesterday, radiates to left shoulder, left neck, -- took 3 ASA and 1 NTG yesterday-- chest pain free afterwards-- no pain at present. Sent from dr's office, told to come to ED.

## 2016-06-17 NOTE — Patient Instructions (Addendum)
It was so nice to meet you!  I spoke with Dr. Aundra Dubin over the phone who is recommending that you go to the Emergency Department to have a blood test (troponin) drawn. If that level is fine, you can be discharged home. He would like you to be seen in their clinic tomorrow and he thinks you will likely need a stress test in the near future.   -Dr. Brett Albino

## 2016-06-17 NOTE — Assessment & Plan Note (Signed)
Pt with some features concerning for ACS, including a feeling of chest tightness associated with left arm heaviness, nausea, and SOB. She also notes some improvement with aspirin and nitro. She does have some atypical features as well, including pain that started while she was watching TV. She has had chest pain multiple times in the past. She had a normal stress test in 2015 and a LHC in 2016 that showed 40-50% LAD stenosis. She was thought to possibly have vasospasm. She has been medically managed. I discussed the patient with Dr. Aundra Dubin (her cardiologist) who recommends that Pt go to the ED to have a troponin drawn. If the troponin is negative, she can be discharged home. Dr. Aundra Dubin would like her to be seen by a PA in clinic tomorrow. Pt will call to schedule this appointment. She will likely need to be set up for a stress test. Pt voiced understanding and left clinic to go to the ED.

## 2016-06-17 NOTE — ED Provider Notes (Signed)
[facility] DEPT Provider Note   CSN: [id number] Arrival date & time: 06/17/16  1159  By signing my name below, I, [name] Rowe, attest that this documentation has been prepared under the direction and in the presence of [name] Manifold, MD . Electronically Signed: Evelene Rowe, Scribe. 06/17/2016. 1:51 PM.   History   Chief Complaint Chief Complaint  Patient presents with  . Chest Pain    The history is provided by the patient. No language interpreter was used.    HPI Comments:  [name] Rowe is a 56 y.o. female with a history of HTN, HLD, MI, and anxiety, who presents to the Emergency Department complaining of CP which began yesterday. Pt states she felt like someone was inflating a balloon in her chest. She took three 81 mg tabs of ASA and nitro at time of onset with moderate relief but notes she now has mild chest tightness at this time.  No fever, chills, or cough.  Pt was sent to ED from PCP's office for further evaluation.   Cardiologist Aundra Dubin  Past Medical History:  Diagnosis Date  . Adenomyosis   . Anxiety 2001  . Chest pain    a. No angiographic CAD by cath 2009 in [place]. There was catheter-induced RCA vasospasm versus microvascular obstruction. bCarlton Adam myoview 04/2010 - EF 79%, normal perfusion.;  c. ETT-Myoview 7/13: no ischemia, EF 77%, submax exercise  . Chronic back pain    had 3 epidural injections on 02-14-13  . Complication of anesthesia    has difficulty voiding after surgery  . Coronary vasospasm ([place])   . CVA (cerebral vascular accident) ([place]) 2006   lost hearing on left  . Depression 2001  . Fibromuscular dysplasia ([place]) 2009   a. Renal artery stenosis due to this. b. Carotid dopplers 07/2011 c/w FMD but no significant stenosis  . Fibromyalgia 1999  . H/O Graves' disease 2003   s/p radio-iodine treatment in 2003   . Hearing loss of left ear    s/p CVA 2006  . HLD (hyperlipidemia) 2000  . HTN (hypertension) 1988   a. Likely related to  RAS; urinary metanephrines and plasma renin/aldosterone ratio normal. b. H/o HTN urgency 06/2011 after being off BP meds for a number of days  . Hx of echocardiogram    a. Echo (1/13): EF 0000000, grade 1 diastolic dysfunction  . Hypertensive crisis 06/29/2011  . Hypothyroidism 2003   Graves disease s/p radio-iodine treatment in 2003   . Irritable bowel syndrome   . Leiomyoma   . Migraine headache   . Myocardial infarction 2009  . Personal history of colonic polyps-sessile serrated adenoma 02/23/2013  . Renal artery stenosis ([place])    a. Due to fibromuscular dysplasia. b. Renal stent 2009. c. Studies:  CTA abdomen (1/13) with evidence for fibromuscular dysplasia, right renal artery stent appears patent. Renal artery dopplers (2/13) with FMD but no evidence for signficant stenosis.   . Tachycardia 2000   10/27/10-Holter -NSR with occ sinus tachy-rare PACs, no sig arrhythmia    Patient Active Problem List   Diagnosis Date Noted  . Fatigue 04/07/2016  . Neck pain 03/16/2016  . Acute left-sided thoracic back pain 03/16/2016  . Pain of left breast 10/30/2015  . Normal coronary arteries in [place] 2009 01/29/2015  . Itching 08/26/2014  . Lichen sclerosus 0000000  . Extremity edema 06/24/2014  . Noncompliance with medications 05/23/2014  . History of hypokalemia 04/22/2014  . Diplopia   . Chest pain 04/12/2014  . Bursitis, trochanteric  11/22/2013  . Essential hypertension 11/19/2013  . Left-sided weakness 08/07/2013  . Personal history of colonic polyps-sessile serrated adenoma 02/23/2013  . HNP (herniated nucleus pulposus), lumbar 12/01/2012  . Lumbar radicular syndrome 12/01/2012  . Seasonal allergies 11/30/2012  . Intervertebral disc protrusion 11/06/2012  . Situational anxiety 09/05/2012  . Vitamin D insufficiency 06/30/2012  . Unilateral hearing loss 06/30/2012  . Vestibular dizziness 06/30/2012  . Routine adult health maintenance 03/31/2012  . Fibromuscular dysplasia ([place])  08/13/2011  . Poorly-controlled hypertension   . History of CVA (cerebrovascular accident)   . Coronary vasospasm ([place])   . Hypothyroidism 04/10/2010  . Hyperlipidemia 04/10/2010  . Anxiety 04/10/2010  . DEPRESSION 04/10/2010  . Migraine headache 04/10/2010  . RENAL ARTERY STENOSIS 04/10/2010  . Fibromyalgia 04/10/2010  . Status post lumbar surgery 04/10/2010  . BACK PAIN 04/09/2010    Past Surgical History:  Procedure Laterality Date  . BACK SURGERY  2010 and 2011   In CT.  11/19/2008 (possibly a laminectomy) and in May of 2011 she underwent a L2-L3 fusion.  . cardiac cath    . CARDIAC CATHETERIZATION  2009   states it was normal  . CARDIAC CATHETERIZATION N/A 02/03/2015   Procedure: Left Heart Cath and Coronary Angiography;  Surgeon: Larey Dresser, MD;  Location: [facility];  Service: Cardiovascular;  Laterality: N/A;  . COLONOSCOPY    . COLONOSCOPY    . HIP SURGERY Bilateral 1988 and 1989   "scraped head of femur"  . KNEE ARTHROSCOPY Left   . LUMBAR LAMINECTOMY/DECOMPRESSION MICRODISCECTOMY Left 05/25/2013   Procedure: LUMBAR LAMINECTOMY/DECOMPRESSION MICRODISCECTOMY 1 LEVEL;  Surgeon: Winfield Cunas, MD;  Location: [facility];  Service: Neurosurgery;  Laterality: Left;  LEFT L5S1 microdiskectomy  . RENAL ARTERY STENT  2009  . ROBOTIC ASSISTED LAP VAGINAL HYSTERECTOMY  05/13/2008   [facility] hysterectomy  for myomatous uterus   . SPINE SURGERY    . VAGINAL HYSTERECTOMY      OB History    No data available       Home Medications    Prior to Admission medications   Medication Sig Start Date End Date Taking? Authorizing Provider  aspirin EC 81 MG tablet Take 81 mg by mouth daily.    Historical Provider, MD  Calcium Carbonate-Vitamin D3 (CALCIUM 600/VITAMIN D) 600-400 MG-UNIT TABS Take 2 tablets by mouth daily with meal. 04/06/16   [name], MD  chlorthalidone (HYGROTON) 25 MG tablet Take 1 tablet (25 mg total) by mouth daily. 03/22/16   [name], MD  cyclobenzaprine (FLEXERIL) 10 MG tablet Take 1 tablet (10 mg total) by mouth 3 (three) times daily as needed for muscle spasms. 03/16/16   [name], MD  labetalol (NORMODYNE) 200 MG tablet Take 1 tablet (200 mg total) by mouth 2 (two) times daily. 03/22/16   [name], MD  levothyroxine (SYNTHROID, LEVOTHROID) 125 MCG tablet Take 1 tablet (125 mcg total) by mouth daily. 03/22/16   [name], MD  linaclotide (LINZESS) 290 MCG CAPS capsule Take 1 capsule (290 mcg total) by mouth daily. 10/30/15   Patrecia Pour, MD  NIFEdipine (PROCARDIA XL/ADALAT-CC) 90 MG 24 hr tablet Take 1 tablet (90 mg total) by mouth daily. 03/22/16   [name], MD  nitroGLYCERIN (NITROSTAT) 0.4 MG SL tablet Place 1 tablet (0.4 mg total) under the tongue every 5 (five) minutes as needed. x3 doses as needed for chest pain 03/22/16   Taye T  Cyndia Skeeters, MD  pantoprazole (PROTONIX) 40 MG tablet Take 1 tablet (40 mg total) by mouth daily. 04/06/16   [name], MD  QUEtiapine (SEROQUEL) 100 MG tablet Take 100 mg by mouth at bedtime.    Historical Provider, MD  rosuvastatin (CRESTOR) 40 MG tablet Take 1 tablet (40 mg total) by mouth daily. 08/28/15   Patrecia Pour, MD  sertraline (ZOLOFT) 100 MG tablet Take 100 mg by mouth daily.    Historical Provider, MD  traZODone (DESYREL) 100 MG tablet Take 100 mg by mouth at bedtime.    Historical Provider, MD  triamcinolone cream (KENALOG) 0.1 % Apply 1 application topically 2 (two) times daily as needed (rash/ irritation). 05/04/16   [name], MD    Family History Family History  Problem Relation Age of Onset  . Hypertension Mother   . Asthma Mother   . Hyperlipidemia Mother   . Stroke Mother   . Hypertension Father   . Prostate cancer Father   . Heart attack Father   . Hyperlipidemia Father   . Stroke Father   . Heart attack    . Stroke    . Hypertension Brother   . Heart attack Brother 25  . Early death Brother 35    massive stroke   . Hyperlipidemia  Brother   . Metabolic syndrome Sister   . Depression Sister   . Hypertension Sister   . Diabetes Maternal Aunt   . Colon cancer Neg Hx   . Stomach cancer Neg Hx     Social History Social History  Substance Use Topics  . Smoking status: Never Smoker  . Smokeless tobacco: Never Used  . Alcohol use Yes     Comment: rarely     Allergies   Hydromorphone hcl; Prochlorperazine edisylate; Sumatriptan; Atorvastatin; and Tramadol   Review of Systems Review of Systems  Constitutional: Negative for chills and fever.  Respiratory: Positive for chest tightness. Negative for cough and shortness of breath.   Cardiovascular: Positive for chest pain.  All other systems reviewed and are negative.    Physical Exam Updated Vital Signs BP 141/89 (BP Location: Right Arm)   Pulse 100   Temp 98.3 F (36.8 C)   Resp 18   Ht 5\' 1"  (1.549 m)   Wt 141 lb (64 kg)   SpO2 100%   BMI 26.64 kg/m   Physical Exam  Constitutional: She is oriented to person, place, and time. She appears well-developed and well-nourished. No distress.  HENT:  Head: Normocephalic and atraumatic.  Eyes: EOM are normal.  Neck: Normal range of motion.  Cardiovascular: Normal rate, regular rhythm and normal heart sounds.   Pulmonary/Chest: Effort normal and breath sounds normal. She exhibits no tenderness.  Abdominal: Soft. She exhibits no distension. There is no tenderness.  Musculoskeletal: Normal range of motion.  Neurological: She is alert and oriented to person, place, and time.  Skin: Skin is warm and dry.  Psychiatric: She has a normal mood and affect. Judgment normal.  Nursing note and vitals reviewed.    ED Treatments / Results  DIAGNOSTIC STUDIES:  Oxygen Saturation is 100% on RA, normal by my interpretation.    COORDINATION OF CARE:  1:49 PM Discussed treatment plan with pt at bedside and pt agreed to plan.  Labs (all labs ordered are listed, but only abnormal results are displayed) Labs  Reviewed  BASIC METABOLIC PANEL - Abnormal; Notable for the following:       Result Value  Glucose, Bld 120 (*)    Creatinine, Ser 1.10 (*)    GFR calc non Af Amer 56 (*)    All other components within normal limits  CBC  I-STAT TROPOININ, ED    EKG  EKG Interpretation  Date/Time:  Thursday June 17 2016 12:02:46 EST Ventricular Rate:  90 PR Interval:  176 QRS Duration: 82 QT Interval:  370 QTC Calculation: 452 R Axis:   52 Text Interpretation:  Normal sinus rhythm Normal ECG Confirmed by Alvino Chapel  MD, NATHAN [phone]) on 06/21/2016 9:56:03 AM       Radiology Dg Chest 2 View  Result Date: 06/17/2016 CLINICAL DATA:  Chest pain and pressure beginning last night. EXAM: CHEST  2 VIEW COMPARISON:  CT chest and PA and lateral chest 10/24/2015. FINDINGS: The lungs are clear. Heart size is normal. No pneumothorax or pleural effusion. No bony abnormality. IMPRESSION: Negative chest. Electronically Signed   By: Inge Rise M.D.   On: 06/17/2016 12:24    Procedures Procedures (including critical care time)  Medications Ordered in ED Medications - No data to display   Initial Impression / Assessment and Plan / ED Course  I have reviewed the triage vital signs and the nursing notes.  Pertinent labs & imaging results that were available during my care of the patient were reviewed by me and considered in my medical decision making (see chart for details).  Clinical Course     55yF with CP. Doubt ACS, PE, dissection or other emergent process. It has been determined that no acute conditions requiring further emergency intervention are present at this time. The patient has been advised of the diagnosis and plan. I reviewed any labs and imaging including any potential incidental findings. We have discussed signs and symptoms that warrant return to the ED and they are listed in the discharge instructions.    Final Clinical Impressions(s) / ED Diagnoses   Final diagnoses:    Chest pain, unspecified type    New Prescriptions New Prescriptions   No medications on file   I personally preformed the services scribed in my presence. The recorded information has been reviewed is accurate. [name] Manifold, MD.     [name] Manifold, MD 06/30/16 [phone]

## 2016-06-17 NOTE — Progress Notes (Signed)
   [facility] Phone: [phone]  Subjective:  [name] Rowe is a 56 year old female presenting to clinic with chest pain that started yesterday around 3PM. She describes the pain as a "weird feeling on her chest" and "feeling tight like someone is pressing down on her chest". The pain started when she was sitting down watching TV. She took her blood pressure, which was 171/111. She then took her Nifedipine and Labetalol. She did not recheck her blood pressure after that. She started to feel some infrequent palpitations. She also noticed that she was nauseated and mildly short of breath. She denies diaphoresis. She took 3 baby aspirin and 1 nitro, which helped her pain a little bit. Nothing made her pain worse. This morning, she is still feeling tightness in her chest. She has also noticed new heaviness in her left arm and pain in her left shoulder and on the left side of her neck. She states that the pain has stayed the same since it started.  She states she has had chest pain like this in the past. She follows with Dr. Aundra Dubin. She had a stress test in 2015, which did not show any ischemia. She had a cardiac cath in 2016 which showed 40-50% LAD stenosis. She has been tried on Imdur in the past, but had to stop it because it caused migraines.  No recent long car/plane rides except for a plane ride from [place] on 1/1. No recent surgeries. No history of DVT/PE. She notes occasional symmetric lower extremity edema.  ROS: See HPI for pertinent positives and negatives  Past Medical History- fibromuscular dysplasia, migraines, HTN, hypothyroidism, anxiety/depression  Family history reviewed for today's visit. No changes.  Social history- patient is a never smoker  Objective: BP 102/70   Pulse 99   Temp 98.3 F (36.8 C) (Oral)   Wt 141 lb (64 kg)   SpO2 98%   BMI 24.20 kg/m  Gen: NAD, alert, cooperative with exam HEENT: NCAT, EOMI, MMM Neck: FROM, supple, no JVD CV:  RRR, no murmur Resp: CTABL, no wheezes, normal work of breathing Msk: No edema, warm, normal tone, moves UE/LE spontaneously  Assessment/Plan: Chest Pain: Pt with some features concerning for ACS, including a feeling of chest tightness associated with left arm heaviness, nausea, and SOB. She also notes some improvement with aspirin and nitro. She does have some atypical features as well, including pain that started while she was watching TV. She has had chest pain multiple times in the past. She had a normal stress test in 2015 and a LHC in 2016 that showed 40-50% LAD stenosis. She was thought to possibly have vasospasm. She has been medically managed. I discussed the patient with Dr. Aundra Dubin (her cardiologist) who recommends that Pt go to the ED to have a troponin drawn. If the troponin is negative, she can be discharged home. Dr. Aundra Dubin would like her to be seen by a PA in clinic tomorrow. Pt will call to schedule this appointment. She will likely need to be set up for a stress test. Pt voiced understanding and left clinic to go to the ED.   Hyman Bible, MD PGY-2

## 2016-06-21 ENCOUNTER — Encounter: Payer: Self-pay | Admitting: Cardiology

## 2016-06-21 ENCOUNTER — Other Ambulatory Visit: Payer: Self-pay | Admitting: Cardiology

## 2016-06-21 ENCOUNTER — Ambulatory Visit (INDEPENDENT_AMBULATORY_CARE_PROVIDER_SITE_OTHER): Payer: Medicaid Other | Admitting: Cardiology

## 2016-06-21 VITALS — BP 141/98 | HR 104 | Ht 64.0 in | Wt 141.6 lb

## 2016-06-21 DIAGNOSIS — R0789 Other chest pain: Secondary | ICD-10-CM

## 2016-06-21 DIAGNOSIS — I10 Essential (primary) hypertension: Secondary | ICD-10-CM

## 2016-06-21 DIAGNOSIS — M79601 Pain in right arm: Secondary | ICD-10-CM

## 2016-06-21 DIAGNOSIS — I701 Atherosclerosis of renal artery: Secondary | ICD-10-CM

## 2016-06-21 DIAGNOSIS — Z8673 Personal history of transient ischemic attack (TIA), and cerebral infarction without residual deficits: Secondary | ICD-10-CM

## 2016-06-21 DIAGNOSIS — R0989 Other specified symptoms and signs involving the circulatory and respiratory systems: Secondary | ICD-10-CM

## 2016-06-21 DIAGNOSIS — M79602 Pain in left arm: Principal | ICD-10-CM

## 2016-06-21 MED ORDER — DILTIAZEM HCL ER 240 MG PO CP24: 240.0000 mg | ORAL_CAPSULE | Freq: Every day | ORAL | 3 refills | Status: DC

## 2016-06-21 NOTE — Assessment & Plan Note (Signed)
CAD with 40-50% stenosis in the proximal LAD and documented vasospasm Aug 2016 Most recent episode prompting ED visit 06/17/16 sounds like palpitations

## 2016-06-21 NOTE — Assessment & Plan Note (Signed)
secondary to fibromuscular dysplasia status post stent placement on the right

## 2016-06-21 NOTE — Patient Instructions (Signed)
Medication Instructions: Kerin Ransom, PA-C has recommended making the following medication changes: 1. STOP Nifedipine 2. START Diltiazem 240 mg - take 1 tablet/capsule by mouth once daily 3. TAKE an extra half tablet of labetalol for palpitations once daily as needed  Labwork: NONE ORDERED  Testing/Procedures: 1. Upper Extremity Arterial Dopplers - Your physician has requested that you have a lower or upper extremity arterial duplex. This test is an ultrasound of the arteries in the legs or arms. It looks at arterial blood flow in the legs and arms. Allow one hour for Lower and Upper Arterial scans. There are no restrictions or special instructions.  Follow-up: Lurena Joiner recommends that you schedule a follow-up appointment in 3 months with Dr Oval Linsey.  If you need a refill on your cardiac medications before your next appointment, please call your pharmacy.

## 2016-06-21 NOTE — Assessment & Plan Note (Signed)
MRI from 2011 and 2015 reveal: 1. No acute intracranial abnormality. 2. Remote lacunar infarct

## 2016-06-21 NOTE — Progress Notes (Signed)
[id number] [name] Rowe   [id number]  [id number]  Primary Physician [name], MD Primary Cardiologist: was Dr Aundra Dubin- I changed to Dr Oval Linsey  HPI:  57 y/o female with hx of HTN, CVA 2006, fibromuscular dysplasia/RAS s/p RA stent 2009, and cath in 2009 in [place] showing no angiographic CAD, but evidence of catheter induced RCA spasm and 40-50% LAD in Aug 2016.Marland Kitchen She has had intermittent chest pain that has been felt due to possible coronary vasospasm vs microsvascular obstruction. She is seen in the office today after being sent to the ED 06/17/16 by her PCP with complaints of chest pain. EKG and Troponin were negative. In the office today she describes what sounds like short  lived (only lasts seconds) episodes of tachycardia and palpations, worse when she lays on her Lt side.    Current Outpatient Prescriptions  Medication Sig Dispense Refill  . aspirin EC 81 MG tablet Take 81 mg by mouth daily.    Marland Kitchen labetalol (NORMODYNE) 200 MG tablet Take 1 tablet (200 mg total) by mouth 2 (two) times daily. 180 tablet 0  . levothyroxine (SYNTHROID, LEVOTHROID) 125 MCG tablet Take 1 tablet (125 mcg total) by mouth daily. 90 tablet 3  . linaclotide (LINZESS) 290 MCG CAPS capsule Take 1 capsule (290 mcg total) by mouth daily. 30 capsule 11  . nitroGLYCERIN (NITROSTAT) 0.4 MG SL tablet Place 1 tablet (0.4 mg total) under the tongue every 5 (five) minutes as needed. x3 doses as needed for chest pain 100 tablet 3  . QUEtiapine (SEROQUEL) 100 MG tablet Take 100 mg by mouth at bedtime.    . rosuvastatin (CRESTOR) 40 MG tablet Take 1 tablet (40 mg total) by mouth daily. 30 tablet 0  . sertraline (ZOLOFT) 100 MG tablet Take 100 mg by mouth daily.    . traZODone (DESYREL) 100 MG tablet Take 100 mg by mouth at bedtime.    . triamcinolone cream (KENALOG) 0.1 % Apply 1 application topically 2 (two) times daily as needed (rash/ irritation). 30 g 1  . diltiazem (DILACOR XR) 240 MG 24 hr capsule Take 1  capsule (240 mg total) by mouth daily. 90 capsule 3   No current facility-administered medications for this visit.     Allergies  Allergen Reactions  . Hydromorphone Hcl Other (See Comments)    Palpitations  . Prochlorperazine Edisylate Other (See Comments)    Causes Seizures   . Sumatriptan Palpitations  . Atorvastatin Other (See Comments)    Joint pain  . Tramadol Anxiety    Social History   Social History  . Marital status: Divorced    Spouse name: N/A  . Number of children: 2  . Years of education: N/A   Occupational History  . worked in a Development worker, international aid. Unemployed    Quit about a yr ago because fo work related injury to left knee   Social History Main Topics  . Smoking status: Never Smoker  . Smokeless tobacco: Never Used  . Alcohol use Yes     Comment: rarely  . Drug use: No  . Sexual activity: Not on file   Other Topics Concern  . Not on file   Social History Narrative   Second Husband is currently in jail for an offense committed in [place] and he is awaiting extradition.   Lives alone.    Jehovah's witness.    Walks 20-30 minutes.  Review of Systems: General: negative for chills, fever, night sweats or weight changes.  Cardiovascular: negative for chest pain, dyspnea on exertion, edema, orthopnea, palpitations, paroxysmal nocturnal dyspnea or shortness of breath Dermatological: negative for rash Respiratory: negative for cough or wheezing Urologic: negative for hematuria Abdominal: negative for nausea, vomiting, diarrhea, bright red blood per rectum, melena, or hematemesis Neurologic: negative for visual changes, syncope, or dizziness Pain in both upper extremities along with intermittent numbness All other systems reviewed and are otherwise negative except as noted above.    Blood pressure (!) 141/98, pulse (!) 104, height 5\' 4"  (1.626 m), weight 141 lb 9.6 oz (64.2 kg), SpO2 99 %.  General appearance: alert,  cooperative and no distress Lungs: clear to auscultation bilaterally Heart: regular rate and rhythm Extremities: she has decreased radial pulses bilateraly Skin: Skin color, texture, turgor normal. No rashes or lesions Neurologic: Grossly normal  EKG NSR-ST-ED 06/17/16  ASSESSMENT AND PLAN:   Chest pain CAD with 40-50% stenosis in the proximal LAD and documented vasospasm Aug 2016 Most recent episode prompting ED visit 06/17/16 sounds like palpitations  History of CVA (cerebrovascular accident) MRI from 2011 and 2015 reveal: 1. No acute intracranial abnormality. 2. Remote lacunar infarct  Poorly-controlled hypertension I changed her Procardia to Diltiazem (palpiations and tachycardia)  RENAL ARTERY STENOSIS  secondary to fibromuscular dysplasia status post stent placement on the right   PLAN  I suggested we change her Procardia to Diltiazem, this may afford a little more chronotropic affect,  and I asked her to take an extra 100 mg of Normodyne if needed for palpitations. If he symptoms worsen she'll need an event monitor. I also ordered upper extremity arterial dopplers. She has a history vascular FMD and radial artery spasm at cath in Aug 20-16.   Kerin Ransom PA-C 06/21/2016 1:13 PM

## 2016-06-21 NOTE — Assessment & Plan Note (Signed)
I changed her Procardia to Diltiazem (palpiations and tachycardia)

## 2016-06-28 ENCOUNTER — Ambulatory Visit (HOSPITAL_COMMUNITY)
Admission: RE | Admit: 2016-06-28 | Discharge: 2016-06-28 | Disposition: A | Discharge status: Home / Self Care | Payer: Medicaid Other | Source: Ambulatory Visit | Attending: Internal Medicine | Admitting: Internal Medicine

## 2016-06-28 DIAGNOSIS — M79602 Pain in left arm: Secondary | ICD-10-CM

## 2016-06-28 DIAGNOSIS — Z8673 Personal history of transient ischemic attack (TIA), and cerebral infarction without residual deficits: Secondary | ICD-10-CM | POA: Insufficient documentation

## 2016-06-28 DIAGNOSIS — M79601 Pain in right arm: Secondary | ICD-10-CM | POA: Diagnosis not present

## 2016-06-28 DIAGNOSIS — I10 Essential (primary) hypertension: Secondary | ICD-10-CM | POA: Insufficient documentation

## 2016-06-28 DIAGNOSIS — R0989 Other specified symptoms and signs involving the circulatory and respiratory systems: Secondary | ICD-10-CM | POA: Diagnosis not present

## 2016-06-28 DIAGNOSIS — E785 Hyperlipidemia, unspecified: Secondary | ICD-10-CM | POA: Insufficient documentation

## 2016-07-02 ENCOUNTER — Encounter (HOSPITAL_COMMUNITY): Payer: Self-pay | Admitting: Emergency Medicine

## 2016-07-02 ENCOUNTER — Ambulatory Visit (HOSPITAL_COMMUNITY)
Admission: EM | Admit: 2016-07-02 | Discharge: 2016-07-02 | Disposition: A | Discharge status: Home / Self Care | Payer: Medicaid Other | Attending: Family Medicine | Admitting: Family Medicine

## 2016-07-02 DIAGNOSIS — S39012S Strain of muscle, fascia and tendon of lower back, sequela: Secondary | ICD-10-CM | POA: Diagnosis not present

## 2016-07-02 DIAGNOSIS — G8929 Other chronic pain: Secondary | ICD-10-CM

## 2016-07-02 DIAGNOSIS — M545 Low back pain, unspecified: Secondary | ICD-10-CM

## 2016-07-02 LAB — POCT URINALYSIS DIP (DEVICE)
Bilirubin Urine: NEGATIVE
GLUCOSE, UA: NEGATIVE mg/dL
Hgb urine dipstick: NEGATIVE
KETONES UR: NEGATIVE mg/dL
Nitrite: NEGATIVE
PROTEIN: NEGATIVE mg/dL
Specific Gravity, Urine: 1.01 (ref 1.005–1.030)
Urobilinogen, UA: 0.2 mg/dL (ref 0.0–1.0)
pH: 6.5 (ref 5.0–8.0)

## 2016-07-02 MED ORDER — HYDROCODONE-ACETAMINOPHEN 5-325 MG PO TABS: 1.0000 | ORAL_TABLET | ORAL | 0 refills | Status: DC | PRN

## 2016-07-02 MED ORDER — CYCLOBENZAPRINE HCL 5 MG PO TABS: 5.0000 mg | ORAL_TABLET | Freq: Three times a day (TID) | ORAL | 0 refills | Status: DC | PRN

## 2016-07-02 NOTE — Discharge Instructions (Signed)
You must follow-up with your primary care doctor on Monday. Additional medications for pain must come from your primary care doctor. Apply heat to the area of pain. He may take the muscle relaxant as needed. He may also perform stretches for muscle spasms. Stop taking the tramadol.

## 2016-07-02 NOTE — ED Provider Notes (Signed)
CSN: [id number]     Arrival date & time 07/02/16  1348 History   First MD Initiated Contact with Patient 07/02/16 1549     Chief Complaint  Patient presents with  . Back Pain   (Consider location/radiation/quality/duration/timing/severity/associated sxs/prior Treatment) 56 year old female with a history of chronic back pain presents today with an exacerbation of the pain across her low back. Started approximately 2 days ago. She has had surgery to the lumbar spine. She has recently seen Dr. Cyndy Freeze and was treated with tramadol. She states it makes her very nervous and shaky in here double. She is unable to take it. Pain is located across the paralumbar musculature. Pain is worse with movement bending twisting. There is nothing new associated with this episode of pain. No focal paresthesias or weakness. She also has a history of renal artery stenosis and ischemic kidney disease. She is unable to take NSAIDs.      Past Medical History:  Diagnosis Date  . Adenomyosis   . Anxiety 2001  . Chest pain    a. No angiographic CAD by cath 2009 in [place]. There was catheter-induced RCA vasospasm versus microvascular obstruction. bCarlton Adam myoview 04/2010 - EF 79%, normal perfusion.;  c. ETT-Myoview 7/13: no ischemia, EF 77%, submax exercise  . Chronic back pain    had 3 epidural injections on 02-14-13  . Complication of anesthesia    has difficulty voiding after surgery  . Coronary vasospasm ([place])   . CVA (cerebral vascular accident) ([place]) 2006   lost hearing on left  . Depression 2001  . Fibromuscular dysplasia ([place]) 2009   a. Renal artery stenosis due to this. b. Carotid dopplers 07/2011 c/w FMD but no significant stenosis  . Fibromyalgia 1999  . H/O Graves' disease 2003   s/p radio-iodine treatment in 2003   . Hearing loss of left ear    s/p CVA 2006  . HLD (hyperlipidemia) 2000  . HTN (hypertension) 1988   a. Likely related to RAS; urinary metanephrines and plasma renin/aldosterone  ratio normal. b. H/o HTN urgency 06/2011 after being off BP meds for a number of days  . Hx of echocardiogram    a. Echo (1/13): EF 0000000, grade 1 diastolic dysfunction  . Hypertensive crisis 06/29/2011  . Hypothyroidism 2003   Graves disease s/p radio-iodine treatment in 2003   . Irritable bowel syndrome   . Leiomyoma   . Migraine headache   . Myocardial infarction 2009  . Personal history of colonic polyps-sessile serrated adenoma 02/23/2013  . Renal artery stenosis ([place])    a. Due to fibromuscular dysplasia. b. Renal stent 2009. c. Studies:  CTA abdomen (1/13) with evidence for fibromuscular dysplasia, right renal artery stent appears patent. Renal artery dopplers (2/13) with FMD but no evidence for signficant stenosis.   . Tachycardia 2000   10/27/10-Holter -NSR with occ sinus tachy-rare PACs, no sig arrhythmia   Past Surgical History:  Procedure Laterality Date  . BACK SURGERY  2010 and 2011   In CT.  11/19/2008 (possibly a laminectomy) and in May of 2011 she underwent a L2-L3 fusion.  . cardiac cath    . CARDIAC CATHETERIZATION  2009   states it was normal  . CARDIAC CATHETERIZATION N/A 02/03/2015   Procedure: Left Heart Cath and Coronary Angiography;  Surgeon: Larey Dresser, MD;  Location: [facility];  Service: Cardiovascular;  Laterality: N/A;  . COLONOSCOPY    . COLONOSCOPY    . HIP SURGERY Bilateral 1988 and 1989   "  scraped head of femur"  . KNEE ARTHROSCOPY Left   . LUMBAR LAMINECTOMY/DECOMPRESSION MICRODISCECTOMY Left 05/25/2013   Procedure: LUMBAR LAMINECTOMY/DECOMPRESSION MICRODISCECTOMY 1 LEVEL;  Surgeon: Winfield Cunas, MD;  Location: [facility];  Service: Neurosurgery;  Laterality: Left;  LEFT L5S1 microdiskectomy  . RENAL ARTERY STENT  2009  . ROBOTIC ASSISTED LAP VAGINAL HYSTERECTOMY  05/13/2008   [facility] hysterectomy  for myomatous uterus   . SPINE SURGERY    . VAGINAL HYSTERECTOMY     Family History  Problem Relation Age of Onset  .  Hypertension Mother   . Asthma Mother   . Hyperlipidemia Mother   . Stroke Mother   . Hypertension Father   . Prostate cancer Father   . Heart attack Father   . Hyperlipidemia Father   . Stroke Father   . Heart attack    . Stroke    . Hypertension Brother   . Heart attack Brother 56  . Early death Brother 42    massive stroke   . Hyperlipidemia Brother   . Metabolic syndrome Sister   . Depression Sister   . Hypertension Sister   . Diabetes Maternal Aunt   . Colon cancer Neg Hx   . Stomach cancer Neg Hx    Social History  Substance Use Topics  . Smoking status: Never Smoker  . Smokeless tobacco: Never Used  . Alcohol use Yes     Comment: rarely   OB History    No data available     Review of Systems  Constitutional: Negative.  Negative for activity change, chills and fever.  HENT: Negative.   Respiratory: Negative.   Cardiovascular: Negative.   Genitourinary: Negative for dysuria, frequency, hematuria, pelvic pain and urgency.  Musculoskeletal:       As per HPI  Skin: Negative for color change, pallor and rash.  Neurological: Negative.   All other systems reviewed and are negative.   Allergies  Hydromorphone hcl; Prochlorperazine edisylate; Sumatriptan; Atorvastatin; and Tramadol  Home Medications   Prior to Admission medications   Medication Sig Start Date End Date Taking? Authorizing Provider  aspirin EC 81 MG tablet Take 81 mg by mouth daily.   Yes Historical Provider, MD  diltiazem (DILACOR XR) 240 MG 24 hr capsule Take 1 capsule (240 mg total) by mouth daily. 06/21/16  Yes Luke K Kilroy, PA-C  labetalol (NORMODYNE) 200 MG tablet Take 1 tablet (200 mg total) by mouth 2 (two) times daily. 03/22/16  Yes [name], MD  levothyroxine (SYNTHROID, LEVOTHROID) 125 MCG tablet Take 1 tablet (125 mcg total) by mouth daily. 03/22/16  Yes [name], MD  linaclotide (LINZESS) 290 MCG CAPS capsule Take 1 capsule (290 mcg total) by mouth daily. 10/30/15  Yes Patrecia Pour, MD  QUEtiapine (SEROQUEL) 100 MG tablet Take 100 mg by mouth at bedtime.   Yes Historical Provider, MD  sertraline (ZOLOFT) 100 MG tablet Take 100 mg by mouth daily.   Yes Historical Provider, MD  traZODone (DESYREL) 100 MG tablet Take 100 mg by mouth at bedtime.   Yes Historical Provider, MD  cyclobenzaprine (FLEXERIL) 5 MG tablet Take 1 tablet (5 mg total) by mouth 3 (three) times daily as needed for muscle spasms. 07/02/16   Janne Napoleon, NP  HYDROcodone-acetaminophen (NORCO/VICODIN) 5-325 MG tablet Take 1 tablet by mouth every 4 (four) hours as needed. 07/02/16   Janne Napoleon, NP  nitroGLYCERIN (NITROSTAT) 0.4 MG SL tablet Place 1 tablet (0.4 mg total)  under the tongue every 5 (five) minutes as needed. x3 doses as needed for chest pain 03/22/16   [name], MD  rosuvastatin (CRESTOR) 40 MG tablet Take 1 tablet (40 mg total) by mouth daily. 08/28/15   Patrecia Pour, MD  triamcinolone cream (KENALOG) 0.1 % Apply 1 application topically 2 (two) times daily as needed (rash/ irritation). 05/04/16   [name], MD   Meds Ordered and Administered this Visit  Medications - No data to display  BP (!) 194/117 (BP Location: Left Arm)   Pulse 95   Temp 98.4 F (36.9 C) (Oral)   SpO2 98%  No data found.   Physical Exam  Constitutional: She is oriented to person, place, and time. She appears well-developed and well-nourished.  HENT:  Head: Atraumatic.  Neck: Normal range of motion. Neck supple.  Cardiovascular: Normal rate.   Pulmonary/Chest: Effort normal.  Musculoskeletal: She exhibits no edema or deformity.  Tenderness across the paralumbar musculature. Having the patient lean forward produces pulling, tugging muscle pain in the lower back. Lower extremity strength is 5 over 5. She is ambulatory with full weightbearing. No evidence of muscle weakness. Surgical Scars are noted to the lumbar spine.  Neurological: She is alert and oriented to person, place, and time.  Skin: Skin is warm and  dry.  Psychiatric: She has a normal mood and affect.  Nursing note and vitals reviewed.   Urgent Care Course     Procedures (including critical care time)  Labs Review Labs Reviewed  POCT URINALYSIS DIP (DEVICE) - Abnormal; Notable for the following:       Result Value   Leukocytes, UA TRACE (*)    All other components within normal limits    Imaging Review No results found.   Visual Acuity Review  Right Eye Distance:   Left Eye Distance:   Bilateral Distance:    Right Eye Near:   Left Eye Near:    Bilateral Near:         MDM   1. Chronic bilateral low back pain without sciatica   2. Strain of lumbar region, sequela    You must follow-up with your primary care doctor on Monday. Additional medications for pain must come from your primary care doctor. Apply heat to the area of pain. He may take the muscle relaxant as needed. He may also perform stretches for muscle spasms. Stop taking the tramadol. Meds ordered this encounter  Medications  . HYDROcodone-acetaminophen (NORCO/VICODIN) 5-325 MG tablet    Sig: Take 1 tablet by mouth every 4 (four) hours as needed.    Dispense:  15 tablet    Refill:  0    Order Specific Question:   Supervising Provider    Answer:   Billy Fischer [phone]  . cyclobenzaprine (FLEXERIL) 5 MG tablet    Sig: Take 1 tablet (5 mg total) by mouth 3 (three) times daily as needed for muscle spasms.    Dispense:  15 tablet    Refill:  0    Order Specific Question:   Supervising Provider    Answer:   Billy Fischer [[id number]]       Janne Napoleon, NP 07/02/16 [phone]

## 2016-07-02 NOTE — ED Triage Notes (Signed)
Pt states she has been having left sided back pain that radiates down into her hip and around to her LLQ.  She reports some urgency to urinate with very little outcome.   She says she has a history of kidney failure and has a stent in her right kidney.

## 2016-07-09 ENCOUNTER — Ambulatory Visit (INDEPENDENT_AMBULATORY_CARE_PROVIDER_SITE_OTHER): Payer: Medicaid Other | Admitting: Student

## 2016-07-09 ENCOUNTER — Encounter: Payer: Self-pay | Admitting: Student

## 2016-07-09 VITALS — BP 138/104 | HR 113 | Temp 98.4°F | Ht 64.0 in | Wt 142.7 lb

## 2016-07-09 DIAGNOSIS — M549 Dorsalgia, unspecified: Secondary | ICD-10-CM

## 2016-07-09 DIAGNOSIS — F329 Major depressive disorder, single episode, unspecified: Secondary | ICD-10-CM | POA: Diagnosis not present

## 2016-07-09 DIAGNOSIS — F32A Depression, unspecified: Secondary | ICD-10-CM

## 2016-07-09 DIAGNOSIS — I10 Essential (primary) hypertension: Secondary | ICD-10-CM | POA: Diagnosis not present

## 2016-07-09 MED ORDER — DULOXETINE HCL 30 MG PO CPEP: 30.0000 mg | ORAL_CAPSULE | Freq: Every day | ORAL | 0 refills | Status: DC

## 2016-07-09 MED ORDER — KETOROLAC TROMETHAMINE 60 MG/2ML IM SOLN
60.0000 mg | Freq: Once | INTRAMUSCULAR | Status: AC
  Administered 2016-07-09: 60 mg via INTRAMUSCULAR

## 2016-07-09 MED ORDER — NAPROXEN 500 MG PO TABS: 500.0000 mg | ORAL_TABLET | Freq: Two times a day (BID) | ORAL | 0 refills | Status: DC

## 2016-07-09 MED ORDER — BACLOFEN 10 MG PO TABS: 10.0000 mg | ORAL_TABLET | Freq: Three times a day (TID) | ORAL | 0 refills | Status: DC

## 2016-07-09 MED ORDER — SERTRALINE HCL 100 MG PO TABS: 50.0000 mg | ORAL_TABLET | Freq: Every day | ORAL | 0 refills | Status: DC

## 2016-07-09 NOTE — Assessment & Plan Note (Addendum)
Likely muscle spasm. Cannot rule out disc prolapse although straight leg raise and contralateral straight leg raises are negative.  No red flags for urgent referral or intervention. Neurologic exam within normal limits except for slightly diminished motor strength in both lower extremities. Patient has history of fibromyalgia, depression, anxiety and multiple other problems in her problem list. I am wondering if some of her somatic symptoms are manifestation of these. Scored 11 on PHQ9 and 6 on GAD 7. She is not suicidal. Patient appears to be in a lot of pain today.  -Gave Toradol injection in the clinic -Gave prescription for baclofen and naproxen -Gave prescription for Cymbalta. Will start at 30 mg daily with a plan to increase to 60 mg daily after 2 weeks. At the same time we will cut down on Zoloft to 50 mg daily for 1 week, then stop. She could benefit from this given history of fibromyalgia, depression and anxiety.  -Gave handout for back exercise -Referral to physical therapy if no improvement over the course of the week or 2

## 2016-07-09 NOTE — Progress Notes (Signed)
Subjective:    [name] Rowe is a 56 y.o. old female here for lower back pain  HPI Lower back pain: for 10 days. Went to UC about a week ago. They gave her Norco and flexeril. She felt dizzy but didn't appreciate much improvement in her pain.  She says "it feels like her upper body is going in to her lower". Pain felt like when she had back surgery about 6 years ago.  Pain is constant. Terrible at night. No radiation to her legs. Denies weakness and numbness in her legs. Denies fever, chills, inconsistence, weight loss, appetite change or saddle anesthesia.  Patient had three back surgeries. S/p spinal surgery with diskectomy and fusion in 2011. Last surgery was in 2014.  Saw her NS three months ago who ordered myelogram but insurance denied. Had MRI 6 months ago which was read as progression of small focal disc protrusion into the left neural foramen at L4-5 with a slight mass effect upon the left L4 nerve in the neural foramen. Stable postsurgical changes at L2-3 and L5-S1.Tried heat and cold but didn't help.   PMH/Problem List: has Hypothyroidism; Hyperlipidemia; Anxiety; Depression; Migraine headache; RENAL ARTERY STENOSIS; Backache; Fibromyalgia; Status post lumbar surgery; Poorly-controlled hypertension; History of CVA (cerebrovascular accident); Coronary vasospasm ([place]); Fibromuscular dysplasia ([place]); Routine adult health maintenance; Vitamin D insufficiency; Unilateral hearing loss; Vestibular dizziness; Situational anxiety; Intervertebral disc protrusion; Seasonal allergies; HNP (herniated nucleus pulposus), lumbar; Lumbar radicular syndrome; Personal history of colonic polyps-sessile serrated adenoma; Left-sided weakness; Acute low back pain; Essential hypertension; Chest pain; Bursitis, trochanteric; Diplopia; History of hypokalemia; Extremity edema; Lichen sclerosus; Itching; Pain of left breast; Neck pain; Acute left-sided thoracic back pain; and Fatigue on her problem list.   has a past medical  history of Adenomyosis; Anxiety (2001); Chest pain; Chronic back pain; Complication of anesthesia; Coronary vasospasm ([facility]); CVA (cerebral vascular accident) ([place]) (2006); Depression (2001); Fibromuscular dysplasia ([place]) (2009); Fibromyalgia (1999); H/O Graves' disease (2003); Hearing loss of left ear; HLD (hyperlipidemia) (2000); HTN (hypertension) (1988); echocardiogram; Hypertensive crisis (06/29/2011); Hypothyroidism (2003); Irritable bowel syndrome; Leiomyoma; Migraine headache; Myocardial infarction (2009); Personal history of colonic polyps-sessile serrated adenoma (02/23/2013); Renal artery stenosis ([place]); and Tachycardia (2000).  FH:  Family History  Problem Relation Age of Onset  . Hypertension Mother   . Asthma Mother   . Hyperlipidemia Mother   . Stroke Mother   . Hypertension Father   . Prostate cancer Father   . Heart attack Father   . Hyperlipidemia Father   . Stroke Father   . Heart attack    . Stroke    . Hypertension Brother   . Heart attack Brother 38  . Early death Brother 51    massive stroke   . Hyperlipidemia Brother   . Metabolic syndrome Sister   . Depression Sister   . Hypertension Sister   . Diabetes Maternal Aunt   . Colon cancer Neg Hx   . Stomach cancer Neg Hx     SH Social History  Substance Use Topics  . Smoking status: Never Smoker  . Smokeless tobacco: Never Used  . Alcohol use Yes     Comment: rarely    Review of Systems Review of systems negative except for pertinent positives and negatives in history of present illness above.     Objective:     Vitals:   07/09/16 0920  BP: (!) 138/104  Pulse: (!) 113  Temp: 98.4 F (36.9 C)  TempSrc: Oral  SpO2: 98%  Weight: 142 lb  11.2 oz (64.7 kg)  Height: 5\' 4"  (1.626 m)    Physical Exam GEN: In significant distress from the back pain, able to walk to the exam table and get on the exam table slowly CVS: Tachycardic, RR, nl S1&S2, no murmurs, no edema RESP: no IWOB, CTAB GI: BS present &  normal, soft, NTND MSK: some tenderness to palpation over paraspinal muscles at the lower lumbar region, negative straight leg raise and counter straight leg raise. Range of motion is limited due to pain.  SKIN: Scar from previous back surgery, no apparent skin lesion  NEURO: alert and oiented appropriately, motor 4/5 in lower extremities bilaterally, light sensation intact in all dermatomes, 2+ patellar reflex bilaterally PSYCH: in distress from pain    Assessment and Plan:  Backache Likely muscle spasm. Cannot rule out disc prolapse although straight leg raise and contralateral straight leg raises are negative.  No red flags for urgent referral or intervention. Neurologic exam within normal limits except for slightly diminished motor strength in both lower extremities. Patient has history of fibromyalgia, depression, anxiety and multiple other problems in her problem list. I am wondering if some of her somatic symptoms are manifestation of these. Scored 11 on PHQ9 and 6 on GAD 7. She is not suicidal. Patient appears to be in a lot of pain today.  -Gave Toradol injection in the clinic -Gave prescription for baclofen and naproxen -Gave prescription for Cymbalta. Will start at 30 mg daily with a plan to increase to 60 mg daily after 2 weeks. At the same time we will cut down on Zoloft to 50 mg daily for 1 week, then stop. She could benefit from this given history of fibromyalgia, depression and anxiety.  -Gave handout for back exercise -Referral to physical therapy if no improvement over the course of the week or 2  Essential hypertension Noted that blood pressure is elevated. She is also tachycardic. I think this is more of autonomic response due to pain.  -Will assess when she returns  Return in about 2 weeks (around 07/23/2016) for back pain and depression.  [name], MD 07/09/16 Pager: [phone]  Preceptor the patient with Dr. Andria Frames.

## 2016-07-09 NOTE — Patient Instructions (Addendum)
It was great seeing you today! We have addressed the following issues today  1. Back pain: This is likely muscle spasm. I have sent a prescription for baclofen and naproxen to your pharmacy that he can start taking today. Please take the naproxen with food.  I also sent a prescription for Cymbalta to your pharmacy. You can start taking this in  4 days from now. You can start with 1 capsule every day for 2 weeks and then increase to 2 capsules every day. I Also recommend reducing his Zoloft to 50 mg daily while taking Cymbalta and  stopping Zoloft after a week on Cymbalta.    I also recommend back stretching exercises. Use the handout for the back exercise.    Please come back and see [place] if there is worsening of your symptoms or other symptoms concerning to you.    If we did any lab work today, and the results require attention, either me or my nurse will get in touch with you. If everything is normal, you will get a letter in mail. If you don't hear from [place] in two weeks, please give [place] a call. Otherwise, we look forward to seeing you again at your next visit. If you have any questions or concerns before then, please call the clinic at [phone].   Please bring all your medications to every doctors visit   Sign up for My Chart to have easy access to your labs results, and communication with your Primary care physician.     Please check-out at the front desk before leaving the clinic.    Take Care,

## 2016-07-09 NOTE — Assessment & Plan Note (Signed)
Noted that blood pressure is elevated. She is also tachycardic. I think this is more of autonomic response due to pain.  -Will assess when she returns

## 2016-07-29 ENCOUNTER — Encounter: Payer: Self-pay | Admitting: Student

## 2016-07-29 ENCOUNTER — Ambulatory Visit (INDEPENDENT_AMBULATORY_CARE_PROVIDER_SITE_OTHER): Payer: Medicaid Other | Admitting: Student

## 2016-07-29 VITALS — BP 138/86 | HR 76 | Temp 98.0°F | Ht 64.0 in | Wt 143.4 lb

## 2016-07-29 DIAGNOSIS — F329 Major depressive disorder, single episode, unspecified: Secondary | ICD-10-CM

## 2016-07-29 DIAGNOSIS — M542 Cervicalgia: Secondary | ICD-10-CM | POA: Diagnosis not present

## 2016-07-29 DIAGNOSIS — M549 Dorsalgia, unspecified: Secondary | ICD-10-CM | POA: Diagnosis not present

## 2016-07-29 DIAGNOSIS — F32A Depression, unspecified: Secondary | ICD-10-CM

## 2016-07-29 MED ORDER — DULOXETINE HCL 60 MG PO CPEP: 60.0000 mg | ORAL_CAPSULE | Freq: Every day | ORAL | 2 refills | Status: DC

## 2016-07-29 NOTE — Assessment & Plan Note (Signed)
Improved tremendously. Tolerating Cymbalta 30 mg well. Has already stopped taking Zoloft.  -Increased Cymbalta 60 mg daily. This could help with her neck pain as well

## 2016-07-29 NOTE — Assessment & Plan Note (Addendum)
She states having this pain for 3 weeks which suggests cervical strain. However, her neck pain appears to be chronic per her chart suggesting cervical discogenic pain.  She had MRI of her cervical spine about 5 months ago which showed cervical spinal degenerative changes with mild foraminal stenosis but without spinal stenosis. She has no radiculopathic pain. Neuro exam is reassuring. There is no red flag either. I also wonder if there is some psychogenic component given history of anxiety, depression and fibromyalgia.  -Recommended ice/heat -Gave handout for neck exercises -Increased her Cymbalta from 30 mg to 60 mg. She tolerated the 30 mg well for the last 2 weeks.

## 2016-07-29 NOTE — Progress Notes (Signed)
Subjective:    [name] Rowe is a 56 y.o. old female here for neck pain and follow up on back pain  HPI Neck pain: for three weeks. Pain mainly on the left. Radiates to shoulder and upper arm. Feels like heaviness in her left arm. Couldn't describe the pain. Feels like her neck is swollen. No history of trauma. Pain when she turns to left, when she sits down for long or lay down. Denies radiculopathic pain in her arms or fingers.  Better when she puts her hand or towel under her neck and support her head. Denies fever, chills, unintentional weight loss, numbness or weakness in lower extremities, urinary retention or fecal incontinence.  Pain progression is about the same. MR cervical spine on 02/19/2016 showed mild cervical spine degeneration with mild foraminal stenosis but without  spinal stenosis.   Back pain: much improved. She is tolerating Cymbalta 30 mg well. She has already stopped taking Zoloft.  No red flags.  PMH/Problem List: has Hypothyroidism; Hyperlipidemia; Anxiety; Depression; Migraine headache; RENAL ARTERY STENOSIS; Backache; Fibromyalgia; Status post lumbar surgery; Poorly-controlled hypertension; History of CVA (cerebrovascular accident); Coronary vasospasm ([place]); Fibromuscular dysplasia ([place]); Routine adult health maintenance; Vitamin D insufficiency; Unilateral hearing loss; Vestibular dizziness; Situational anxiety; Intervertebral disc protrusion; Seasonal allergies; HNP (herniated nucleus pulposus), lumbar; Lumbar radicular syndrome; Personal history of colonic polyps-sessile serrated adenoma; Left-sided weakness; Acute low back pain; Essential hypertension; Chest pain; Bursitis, trochanteric; Diplopia; History of hypokalemia; Extremity edema; Lichen sclerosus; Itching; Pain of left breast; Neck pain on left side; Acute left-sided thoracic back pain; and Fatigue on her problem list.   has a past medical history of Adenomyosis; Anxiety (2001); Chest pain; Chronic back pain;  Complication of anesthesia; Coronary vasospasm (HCC); CVA (cerebral vascular accident) ([place]) (2006); Depression (2001); Fibromuscular dysplasia ([place]) (2009); Fibromyalgia (1999); H/O Graves' disease (2003); Hearing loss of left ear; HLD (hyperlipidemia) (2000); HTN (hypertension) (1988); echocardiogram; Hypertensive crisis (06/29/2011); Hypothyroidism (2003); Irritable bowel syndrome; Leiomyoma; Migraine headache; Myocardial infarction (2009); Personal history of colonic polyps-sessile serrated adenoma (02/23/2013); Renal artery stenosis ([place]); and Tachycardia (2000).  FH:  Family History  Problem Relation Age of Onset  . Hypertension Mother   . Asthma Mother   . Hyperlipidemia Mother   . Stroke Mother   . Hypertension Father   . Prostate cancer Father   . Heart attack Father   . Hyperlipidemia Father   . Stroke Father   . Heart attack    . Stroke    . Hypertension Brother   . Heart attack Brother 53  . Early death Brother 39    massive stroke   . Hyperlipidemia Brother   . Metabolic syndrome Sister   . Depression Sister   . Hypertension Sister   . Diabetes Maternal Aunt   . Colon cancer Neg Hx   . Stomach cancer Neg Hx     SH Social History  Substance Use Topics  . Smoking status: Never Smoker  . Smokeless tobacco: Never Used  . Alcohol use Yes     Comment: rarely    Review of Systems Review of systems negative except for pertinent positives and negatives in history of present illness above.     Objective:     Vitals:   07/29/16 1417  BP: 138/86  Pulse: 76  Temp: 98 F (36.7 C)  TempSrc: Oral  SpO2: 99%  Weight: 143 lb 6.4 oz (65 kg)  Height: 5\' 4"  (1.626 m)    Physical Exam GEN: appears well, no apparent distress. Head:  normocephalic and atraumatic  HEM: negative for cervical or periauricular lymphadenopathies CVS: RRR, nl S1&S2, no murmurs, no edema, cap refills < 2 secs RESP: speaks in full sentence, no IWOB MSK:  Neck: No notable swelling or overlying  skin color change. Appears symmetric. Mild tenderness to palpation over the trapezius muscles laterally on the left. Limited range of motion due to pain, especially with neck flexion and turning left. Full range of motion in her shoulders. Spurling sign negative bilaterally. Motor and light sensation intact in all dermatomes of her LUE's SKIN: no apparent skin lesion NEURO: alert and oiented appropriately, no gross defecits  PSYCH: euthymic mood with congruent affect    Assessment and Plan:  Neck pain on left side She states having this pain for 3 weeks which suggests cervical strain. However, her neck pain appears to be chronic per her chart suggesting cervical discogenic pain.  She had MRI of her cervical spine about 5 months ago which showed cervical spinal degenerative changes with mild foraminal stenosis but without spinal stenosis. She has no radiculopathic pain. Neuro exam is reassuring. There is no red flag either. I also wonder if there is some psychogenic component given history of anxiety, depression and fibromyalgia.  -Recommended ice/heat -Gave handout for neck exercises -Increased her Cymbalta from 30 mg to 60 mg. She tolerated the 30 mg well for the last 2 weeks.   Backache Improved tremendously. Tolerating Cymbalta 30 mg well. Has already stopped taking Zoloft.  -Increased Cymbalta 60 mg daily. This could help with her neck pain as well  Return if symptoms worsen or fail to improve.  [name], MD 07/29/16 Pager: [phone]

## 2016-07-29 NOTE — Patient Instructions (Signed)
It was great seeing you today! We have addressed the following issues today  1. Neck pain: this could be muscle strain or due to arthritic changes in your neck. I recommended using heat or ice and neck exercise as below. I have increased you Cymbalta to 60 mg daily as well. Please come back and see [place] if no improvement over the course of one to two weeks.    If we did any lab work today, and the results require attention, either me or my nurse will get in touch with you. If everything is normal, you will get a letter in mail. If you don't hear from [place] in two weeks, please give [place] a call. Otherwise, we look forward to seeing you again at your next visit. If you have any questions or concerns before then, please call the clinic at [phone].   Please bring all your medications to every doctors visit   Sign up for My Chart to have easy access to your labs results, and communication with your Primary care physician.     Please check-out at the front desk before leaving the clinic.     Neck Exercises Neck exercises can be important for many reasons:  They can help you to improve and maintain flexibility in your neck. This can be especially important as you age.  They can help to make your neck stronger. This can make movement easier.  They can reduce or prevent neck pain.  They may help your upper back. Ask your health care provider which neck exercises would be best for you. Exercises Neck Press  Repeat this exercise 10 times. Do it first thing in the morning and right before bed or as told by your health care provider. 1. Lie on your back on a firm bed or on the floor with a pillow under your head. 2. Use your neck muscles to push your head down on the pillow and straighten your spine. 3. Hold the position as well as you can. Keep your head facing up and your chin tucked. 4. Slowly count to 5 while holding this position. 5. Relax for a few seconds. Then repeat. Isometric  Strengthening  Do a full set of these exercises 2 times a day or as told by your health care provider. 1. Sit in a supportive chair and place your hand on your forehead. 2. Push forward with your head and neck while pushing back with your hand. Hold for 10 seconds. 3. Relax. Then repeat the exercise 3 times. 4. Next, do thesequence again, this time putting your hand against the back of your head. Use your head and neck to push backward against the hand pressure. 5. Finally, do the same exercise on either side of your head, pushing sideways against the pressure of your hand. Prone Head Lifts  Repeat this exercise 5 times. Do this 2 times a day or as told by your health care provider. 1. Lie face-down, resting on your elbows so that your chest and upper back are raised. 2. Start with your head facing downward, near your chest. Position your chin either on or near your chest. 3. Slowly lift your head upward. Lift until you are looking straight ahead. Then continue lifting your head as far back as you can stretch. 4. Hold your head up for 5 seconds. Then slowly lower it to your starting position. Supine Head Lifts  Repeat this exercise 8-10 times. Do this 2 times a day or as told by your  health care provider. 1. Lie on your back, bending your knees to point to the ceiling and keeping your feet flat on the floor. 2. Lift your head slowly off the floor, raising your chin toward your chest. 3. Hold for 5 seconds. 4. Relax and repeat. Scapular Retraction  Repeat this exercise 5 times. Do this 2 times a day or as told by your health care provider. 1. Stand with your arms at your sides. Look straight ahead. 2. Slowly pull both shoulders backward and downward until you feel a stretch between your shoulder blades in your upper back. 3. Hold for 10-30 seconds. 4. Relax and repeat. Contact a health care provider if:  Your neck pain or discomfort gets much worse when you do an exercise.  Your neck  pain or discomfort does not improve within 2 hours after you exercise. If you have any of these problems, stop exercising right away. Do not do the exercises again unless your health care provider says that you can. Get help right away if:  You develop sudden, severe neck pain. If this happens, stop exercising right away. Do not do the exercises again unless your health care provider says that you can. Exercises Neck Stretch  Repeat this exercise 3-5 times. 1. Do this exercise while standing or while sitting in a chair. 2. Place your feet flat on the floor, shoulder-width apart. 3. Slowly turn your head to the right. Turn it all the way to the right so you can look over your right shoulder. Do not tilt or tip your head. 4. Hold this position for 10-30 seconds. 5. Slowly turn your head to the left, to look over your left shoulder. 6. Hold this position for 10-30 seconds. Neck Retraction  Repeat this exercise 8-10 times. Do this 3-4 times a day or as told by your health care provider. 1. Do this exercise while standing or while sitting in a sturdy chair. 2. Look straight ahead. Do not bend your neck. 3. Use your fingers to push your chin backward. Do not bend your neck for this movement. Continue to face straight ahead. If you are doing the exercise properly, you will feel a slight sensation in your throat and a stretch at the back of your neck. 4. Hold the stretch for 1-2 seconds. Relax and repeat. This information is not intended to replace advice given to you by your health care provider. Make sure you discuss any questions you have with your health care provider. Document Released: 05/05/2015 Document Revised: 10/30/2015 Document Reviewed: 12/02/2014 Elsevier Interactive Patient Education  2017 [employer].

## 2016-08-29 ENCOUNTER — Emergency Department (HOSPITAL_COMMUNITY): Payer: Medicaid Other

## 2016-08-29 ENCOUNTER — Encounter (HOSPITAL_COMMUNITY): Payer: Self-pay | Admitting: Emergency Medicine

## 2016-08-29 ENCOUNTER — Observation Stay (HOSPITAL_COMMUNITY)
Admission: EM | Admit: 2016-08-29 | Discharge: 2016-08-31 | Disposition: A | Discharge status: Home / Self Care | Payer: Medicaid Other | Attending: Family Medicine | Admitting: Family Medicine

## 2016-08-29 DIAGNOSIS — I252 Old myocardial infarction: Secondary | ICD-10-CM | POA: Diagnosis not present

## 2016-08-29 DIAGNOSIS — I249 Acute ischemic heart disease, unspecified: Secondary | ICD-10-CM | POA: Diagnosis present

## 2016-08-29 DIAGNOSIS — I10 Essential (primary) hypertension: Secondary | ICD-10-CM | POA: Diagnosis not present

## 2016-08-29 DIAGNOSIS — Z7982 Long term (current) use of aspirin: Secondary | ICD-10-CM | POA: Insufficient documentation

## 2016-08-29 DIAGNOSIS — Z79899 Other long term (current) drug therapy: Secondary | ICD-10-CM | POA: Insufficient documentation

## 2016-08-29 DIAGNOSIS — R079 Chest pain, unspecified: Secondary | ICD-10-CM | POA: Diagnosis present

## 2016-08-29 DIAGNOSIS — I200 Unstable angina: Principal | ICD-10-CM | POA: Insufficient documentation

## 2016-08-29 DIAGNOSIS — I959 Hypotension, unspecified: Secondary | ICD-10-CM | POA: Diagnosis not present

## 2016-08-29 DIAGNOSIS — Z8673 Personal history of transient ischemic attack (TIA), and cerebral infarction without residual deficits: Secondary | ICD-10-CM | POA: Insufficient documentation

## 2016-08-29 DIAGNOSIS — E039 Hypothyroidism, unspecified: Secondary | ICD-10-CM | POA: Diagnosis not present

## 2016-08-29 HISTORY — DX: Cardiac arrhythmia, unspecified: I49.9

## 2016-08-29 HISTORY — DX: Other specified postprocedural states: Z98.890

## 2016-08-29 HISTORY — DX: Angina pectoris, unspecified: I20.9

## 2016-08-29 LAB — I-STAT TROPONIN, ED: TROPONIN I, POC: 0 ng/mL (ref 0.00–0.08)

## 2016-08-29 LAB — BASIC METABOLIC PANEL
ANION GAP: 8 (ref 5–15)
BUN: 12 mg/dL (ref 6–20)
CHLORIDE: 101 mmol/L (ref 101–111)
CO2: 28 mmol/L (ref 22–32)
Calcium: 9.2 mg/dL (ref 8.9–10.3)
Creatinine, Ser: 1.12 mg/dL — ABNORMAL HIGH (ref 0.44–1.00)
GFR, EST NON AFRICAN AMERICAN: 55 mL/min — AB (ref >60)
Glucose, Bld: 106 mg/dL — ABNORMAL HIGH (ref 65–99)
POTASSIUM: 3.8 mmol/L (ref 3.5–5.1)
Sodium: 137 mmol/L (ref 135–145)

## 2016-08-29 LAB — CBC
HCT: 39.6 % (ref 36.0–46.0)
HEMOGLOBIN: 14.3 g/dL (ref 12.0–15.0)
MCH: 30.8 pg (ref 26.0–34.0)
MCHC: 36.1 g/dL — AB (ref 30.0–36.0)
MCV: 85.2 fL (ref 78.0–100.0)
PLATELETS: 267 10*3/uL (ref 150–400)
RBC: 4.65 MIL/uL (ref 3.87–5.11)
RDW: 12.7 % (ref 11.5–15.5)
WBC: 6.5 10*3/uL (ref 4.0–10.5)

## 2016-08-29 MED ORDER — METOCLOPRAMIDE HCL 5 MG/ML IJ SOLN
10.0000 mg | Freq: Once | INTRAMUSCULAR | Status: AC
  Administered 2016-08-29: 10 mg via INTRAVENOUS
  Filled 2016-08-29: qty 2

## 2016-08-29 MED ORDER — DIPHENHYDRAMINE HCL 50 MG/ML IJ SOLN
25.0000 mg | Freq: Once | INTRAMUSCULAR | Status: AC
  Administered 2016-08-29: 25 mg via INTRAVENOUS
  Filled 2016-08-29: qty 1

## 2016-08-29 MED ORDER — ONDANSETRON HCL 4 MG/2ML IJ SOLN
4.0000 mg | Freq: Once | INTRAMUSCULAR | Status: AC | PRN
  Administered 2016-08-29: 4 mg via INTRAVENOUS
  Filled 2016-08-29: qty 2

## 2016-08-29 NOTE — ED Provider Notes (Signed)
[facility] DEPT Provider Note   CSN: [id number] Arrival date & time: 08/29/16  2251  By signing my name below, I, Oleh Genin, attest that this documentation has been prepared under the direction and in the presence of Ripley Fraise, MD. Electronically Signed: Oleh Genin, Scribe. 08/29/16. 11:28 PM.   History   Chief Complaint Chief Complaint  Patient presents with  . Chest Pain  . Headache    HPI [name] Rowe is a 56 y.o. female history of CVA, hypothyroidism, hypertension who presents to the ED for evaluation of chest pain. This patient states that approximately 3-4 hours ago she developed sternal, "stabbing" chest pain. She called EMS who advised her to tak 2x sublingual nitro which she did. She had 1 instance of vomiting at home. She also reports occasional headaches tonight which are "pressure-like" behind the eyes with neck pain.  Denies dyspnea. No abdominal pain or nausea currently. When asked, she states her headache, neck pain symptoms are her most significant compliant.   The history is provided by the patient. No language interpreter was used.  Chest Pain   This is a recurrent problem. The current episode started 3 to 5 hours ago. The problem occurs constantly. The problem has not changed since onset.Associated with: No modifying factors. Pain location: Central. The quality of the pain is described as stabbing. The pain does not radiate. Associated symptoms include vomiting. Pertinent negatives include no abdominal pain and no nausea.    Past Medical History:  Diagnosis Date  . Adenomyosis   . Anxiety 2001  . Chest pain    a. No angiographic CAD by cath 2009 in [place]. There was catheter-induced RCA vasospasm versus microvascular obstruction. b[place] Adam myoview 04/2010 - EF 79%, normal perfusion.;  c. ETT-Myoview 7/13: no ischemia, EF 77%, submax exercise  . Chronic back pain    had 3 epidural injections on 02-14-13  . Complication of anesthesia      has difficulty voiding after surgery  . Coronary vasospasm ([place])   . CVA (cerebral vascular accident) ([place]) 2006   lost hearing on left  . Depression 2001  . Fibromuscular dysplasia ([place]) 2009   a. Renal artery stenosis due to this. b. Carotid dopplers 07/2011 c/w FMD but no significant stenosis  . Fibromyalgia 1999  . H/O Graves' disease 2003   s/p radio-iodine treatment in 2003   . Hearing loss of left ear    s/p CVA 2006  . HLD (hyperlipidemia) 2000  . HTN (hypertension) 1988   a. Likely related to RAS; urinary metanephrines and plasma renin/aldosterone ratio normal. b. H/o HTN urgency 06/2011 after being off BP meds for a number of days  . Hx of echocardiogram    a. Echo (1/13): EF 41-93%, grade 1 diastolic dysfunction  . Hypertensive crisis 06/29/2011  . Hypothyroidism 2003   Graves disease s/p radio-iodine treatment in 2003   . Irritable bowel syndrome   . Leiomyoma   . Migraine headache   . Myocardial infarction 2009  . Personal history of colonic polyps-sessile serrated adenoma 02/23/2013  . Renal artery stenosis ([place])    a. Due to fibromuscular dysplasia. b. Renal stent 2009. c. Studies:  CTA abdomen (1/13) with evidence for fibromuscular dysplasia, right renal artery stent appears patent. Renal artery dopplers (2/13) with FMD but no evidence for signficant stenosis.   . Tachycardia 2000   10/27/10-Holter -NSR with occ sinus tachy-rare PACs, no sig arrhythmia    Patient Active Problem List   Diagnosis Date Noted  .  Fatigue 04/07/2016  . Neck pain on left side 03/16/2016  . Acute left-sided thoracic back pain 03/16/2016  . Pain of left breast 10/30/2015  . Itching 08/26/2014  . Lichen sclerosus 32/20/2542  . Extremity edema 06/24/2014  . History of hypokalemia 04/22/2014  . Diplopia   . Chest pain 04/12/2014  . Bursitis, trochanteric 11/22/2013  . Essential hypertension 11/19/2013  . Acute low back pain 08/10/2013  . Left-sided weakness 08/07/2013  . Personal  history of colonic polyps-sessile serrated adenoma 02/23/2013  . HNP (herniated nucleus pulposus), lumbar 12/01/2012  . Lumbar radicular syndrome 12/01/2012  . Seasonal allergies 11/30/2012  . Intervertebral disc protrusion 11/06/2012  . Situational anxiety 09/05/2012  . Vitamin D insufficiency 06/30/2012  . Unilateral hearing loss 06/30/2012  . Vestibular dizziness 06/30/2012  . Routine adult health maintenance 03/31/2012  . Fibromuscular dysplasia ([place]) 08/13/2011  . Poorly-controlled hypertension   . History of CVA (cerebrovascular accident)   . Coronary vasospasm ([place])   . Hypothyroidism 04/10/2010  . Hyperlipidemia 04/10/2010  . Anxiety 04/10/2010  . Depression 04/10/2010  . Migraine headache 04/10/2010  . RENAL ARTERY STENOSIS 04/10/2010  . Fibromyalgia 04/10/2010  . Status post lumbar surgery 04/10/2010  . Backache 04/09/2010    Past Surgical History:  Procedure Laterality Date  . BACK SURGERY  2010 and 2011   In CT.  11/19/2008 (possibly a laminectomy) and in May of 2011 she underwent a L2-L3 fusion.  . cardiac cath    . CARDIAC CATHETERIZATION  2009   states it was normal  . CARDIAC CATHETERIZATION N/A 02/03/2015   Procedure: Left Heart Cath and Coronary Angiography;  Surgeon: Larey Dresser, MD;  Location: [facility];  Service: Cardiovascular;  Laterality: N/A;  . COLONOSCOPY    . COLONOSCOPY    . HIP SURGERY Bilateral 1988 and 1989   "scraped head of femur"  . KNEE ARTHROSCOPY Left   . LUMBAR LAMINECTOMY/DECOMPRESSION MICRODISCECTOMY Left 05/25/2013   Procedure: LUMBAR LAMINECTOMY/DECOMPRESSION MICRODISCECTOMY 1 LEVEL;  Surgeon: Winfield Cunas, MD;  Location: [facility];  Service: Neurosurgery;  Laterality: Left;  LEFT L5S1 microdiskectomy  . RENAL ARTERY STENT  2009  . ROBOTIC ASSISTED LAP VAGINAL HYSTERECTOMY  05/13/2008   [facility] hysterectomy  for myomatous uterus   . SPINE SURGERY    . VAGINAL HYSTERECTOMY      OB History    No data  available       Home Medications    Prior to Admission medications   Medication Sig Start Date End Date Taking? Authorizing Provider  aspirin EC 81 MG tablet Take 81 mg by mouth daily.    Historical Provider, MD  baclofen (LIORESAL) 10 MG tablet Take 1 tablet (10 mg total) by mouth 3 (three) times daily. 07/09/16   [name], MD  diltiazem (DILACOR XR) 240 MG 24 hr capsule Take 1 capsule (240 mg total) by mouth daily. 06/21/16   Erlene Quan, PA-C  DULoxetine (CYMBALTA) 60 MG capsule Take 1 capsule (60 mg total) by mouth daily. 07/29/16   [name], MD  labetalol (NORMODYNE) 200 MG tablet Take 1 tablet (200 mg total) by mouth 2 (two) times daily. 03/22/16   [name], MD  levothyroxine (SYNTHROID, LEVOTHROID) 125 MCG tablet Take 1 tablet (125 mcg total) by mouth daily. 03/22/16   [name], MD  linaclotide (LINZESS) 290 MCG CAPS capsule Take 1 capsule (290 mcg total) by mouth daily. 10/30/15   Patrecia Pour, MD  naproxen (NAPROSYN) 500 MG tablet Take 1 tablet (500 mg total) by mouth 2 (two) times daily with a meal. 07/09/16   [name], MD  nitroGLYCERIN (NITROSTAT) 0.4 MG SL tablet Place 1 tablet (0.4 mg total) under the tongue every 5 (five) minutes as needed. x3 doses as needed for chest pain 03/22/16   [name], MD  QUEtiapine (SEROQUEL) 100 MG tablet Take 100 mg by mouth at bedtime.    Historical Provider, MD  rosuvastatin (CRESTOR) 40 MG tablet Take 1 tablet (40 mg total) by mouth daily. 08/28/15   Patrecia Pour, MD  traZODone (DESYREL) 100 MG tablet Take 100 mg by mouth at bedtime.    Historical Provider, MD  triamcinolone cream (KENALOG) 0.1 % Apply 1 application topically 2 (two) times daily as needed (rash/ irritation). 05/04/16   [name], MD    Family History Family History  Problem Relation Age of Onset  . Hypertension Mother   . Asthma Mother   . Hyperlipidemia Mother   . Stroke Mother   . Hypertension Father   . Prostate cancer Father   . Heart attack  Father   . Hyperlipidemia Father   . Stroke Father   . Heart attack    . Stroke    . Hypertension Brother   . Heart attack Brother 11  . Early death Brother 59    massive stroke   . Hyperlipidemia Brother   . Metabolic syndrome Sister   . Depression Sister   . Hypertension Sister   . Diabetes Maternal Aunt   . Colon cancer Neg Hx   . Stomach cancer Neg Hx     Social History Social History  Substance Use Topics  . Smoking status: Never Smoker  . Smokeless tobacco: Never Used  . Alcohol use Yes     Comment: rarely     Allergies   Hydromorphone hcl; Prochlorperazine edisylate; Sumatriptan; Atorvastatin; and Tramadol   Review of Systems Review of Systems  Cardiovascular: Positive for chest pain. Negative for leg swelling.  Gastrointestinal: Positive for vomiting. Negative for abdominal pain, diarrhea and nausea.  Musculoskeletal: Positive for neck pain.  All other systems reviewed and are negative.    Physical Exam Updated Vital Signs BP (!) 171/116 (BP Location: Right Arm)   Pulse 89   Temp 98.2 F (36.8 C) (Oral)   Resp 20   SpO2 99%   Physical Exam CONSTITUTIONAL: Well developed/well nourished; uncomfortable appearing HEAD: Normocephalic/atraumatic EYES: EOMI/PERRL ENMT: Mucous membranes moist NECK: supple no meningeal signs SPINE/BACK:entire spine nontender CV: S1/S2 noted, no murmurs/rubs/gallops noted LUNGS: Lungs are clear to auscultation bilaterally, no apparent distress ABDOMEN: soft, nontender, no rebound or guarding, bowel sounds noted throughout abdomen GU:no cva tenderness NEURO: Pt is awake/alert/appropriate, moves all extremitiesx4.  No facial droop.  No arm or leg drift.  EXTREMITIES: pulses normal/equal x4, full ROM, no lower extremity edema SKIN: warm, color normal PSYCH: anxious   ED Treatments / Results  Labs (all labs ordered are listed, but only abnormal results are displayed) Labs Reviewed  BASIC METABOLIC PANEL - Abnormal;  Notable for the following:       Result Value   Glucose, Bld 106 (*)    Creatinine, Ser 1.12 (*)    GFR calc non Af Amer 55 (*)    All other components within normal limits  CBC - Abnormal; Notable for the following:    MCHC 36.1 (*)    All other components within normal limits  I-STAT  [place], ED    EKG ED ECG REPORT   Date: 08/29/2016 2253  Rate: 96  Rhythm: normal sinus rhythm  QRS Axis: normal  Intervals: normal  ST/T Wave abnormalities: normal  Conduction Disutrbances:none  Narrative Interpretation:   Old EKG Reviewed: unchanged  I have personally reviewed the EKG tracing and agree with the computerized printout as noted.  Radiology Dg Chest 2 View  Result Date: 08/29/2016 CLINICAL DATA:  Chest pain. EXAM: CHEST  2 VIEW COMPARISON:  Radiographs of June 17, 2016. FINDINGS: The heart size and mediastinal contours are within normal limits. Both lungs are clear. No pneumothorax or pleural effusion is noted. The visualized skeletal structures are unremarkable. IMPRESSION: No active cardiopulmonary disease. Electronically Signed   By: Marijo Conception, M.D.   On: 08/29/2016 23:23    Procedures Procedures   Medications Ordered in ED Medications  ondansetron [facility]) injection 4 mg (4 mg Intravenous Given 08/29/16 2305)  metoCLOPramide (REGLAN) injection 10 mg (10 mg Intravenous Given 08/29/16 2332)  diphenhydrAMINE (BENADRYL) injection 25 mg (25 mg Intravenous Given 08/29/16 2332)  aspirin chewable tablet 324 mg (324 mg Oral Given 08/30/16 0126)     Initial Impression / Assessment and Plan / ED Course  I have reviewed the triage vital signs and the nursing notes.  Pertinent labs & imaging results that were available during my care of the patient were reviewed by me and considered in my medical decision making (see chart for details).     11:48 PM Pt stable HA/neck pain do not appear acute, has been seen for neck pain previously As for CP, no EKG changes and  troponin negative Doubt PE/Dissection at this time given history/exam  2:07 AM Pt with some improvement in HA Her CP is still present Due to lower BPs, avoided NTG at this time She has known h/o CAD, and due to history, unable to r/o ACS at this time Pt reports similar to prior angina  D/w family medicine for admit She will require cardiac marker rule out and potentially stress imaging  Final Clinical Impressions(s) / ED Diagnoses   Final diagnoses:  Unstable angina pectoris ([place])    New Prescriptions New Prescriptions   No medications on file  I personally performed the services described in this documentation, which was scribed in my presence. The recorded information has been reviewed and is accurate.       Ripley Fraise, MD 08/30/16 [phone]

## 2016-08-29 NOTE — ED Triage Notes (Signed)
Pt arrives via EMS with compliants of chest pain that is substernal, non radiating with N/V. Pt took 2 Sl Nitro at home with no relief. Pt also c/o of migraine headache. Pt took naproxen and tramadol at home for this. Pt did receive 4mg  of zofran in route

## 2016-08-30 ENCOUNTER — Encounter (HOSPITAL_COMMUNITY): Payer: Self-pay | Admitting: *Deleted

## 2016-08-30 ENCOUNTER — Observation Stay (HOSPITAL_COMMUNITY): Payer: Medicaid Other

## 2016-08-30 DIAGNOSIS — I959 Hypotension, unspecified: Secondary | ICD-10-CM | POA: Diagnosis not present

## 2016-08-30 DIAGNOSIS — R079 Chest pain, unspecified: Secondary | ICD-10-CM | POA: Diagnosis not present

## 2016-08-30 DIAGNOSIS — I249 Acute ischemic heart disease, unspecified: Secondary | ICD-10-CM | POA: Diagnosis not present

## 2016-08-30 DIAGNOSIS — I201 Angina pectoris with documented spasm: Secondary | ICD-10-CM | POA: Diagnosis not present

## 2016-08-30 DIAGNOSIS — I200 Unstable angina: Secondary | ICD-10-CM | POA: Diagnosis not present

## 2016-08-30 LAB — COMPREHENSIVE METABOLIC PANEL
ALT: 22 U/L (ref 14–54)
AST: 25 U/L (ref 15–41)
Albumin: 3.5 g/dL (ref 3.5–5.0)
Alkaline Phosphatase: 109 U/L (ref 38–126)
Anion gap: 7 (ref 5–15)
BILIRUBIN TOTAL: 0.5 mg/dL (ref 0.3–1.2)
BUN: 13 mg/dL (ref 6–20)
CHLORIDE: 105 mmol/L (ref 101–111)
CO2: 25 mmol/L (ref 22–32)
CREATININE: 1.15 mg/dL — AB (ref 0.44–1.00)
Calcium: 8.1 mg/dL — ABNORMAL LOW (ref 8.9–10.3)
GFR calc Af Amer: >60 mL/min (ref >60)
GFR, EST NON AFRICAN AMERICAN: 53 mL/min — AB (ref >60)
GLUCOSE: 96 mg/dL (ref 65–99)
Potassium: 4.1 mmol/L (ref 3.5–5.1)
Sodium: 137 mmol/L (ref 135–145)
Total Protein: 5.8 g/dL — ABNORMAL LOW (ref 6.5–8.1)

## 2016-08-30 LAB — LIPID PANEL
Cholesterol: 268 mg/dL — ABNORMAL HIGH (ref 0–200)
HDL: 46 mg/dL (ref >40)
LDL Cholesterol: 182 mg/dL — ABNORMAL HIGH (ref 0–99)
Total CHOL/HDL Ratio: 5.8 RATIO
Triglycerides: 202 mg/dL — ABNORMAL HIGH (ref <150)
VLDL: 40 mg/dL (ref 0–40)

## 2016-08-30 LAB — MRSA PCR SCREENING: MRSA BY PCR: NEGATIVE

## 2016-08-30 LAB — HIV ANTIBODY (ROUTINE TESTING W REFLEX): HIV SCREEN 4TH GENERATION: NONREACTIVE

## 2016-08-30 LAB — TROPONIN I

## 2016-08-30 LAB — BRAIN NATRIURETIC PEPTIDE: B NATRIURETIC PEPTIDE 5: 14.9 pg/mL (ref 0.0–100.0)

## 2016-08-30 LAB — LIPASE, BLOOD: LIPASE: 14 U/L (ref 11–51)

## 2016-08-30 MED ORDER — ACETAMINOPHEN 325 MG PO TABS
650.0000 mg | ORAL_TABLET | ORAL | Status: DC | PRN
  Administered 2016-08-30 (×2): 650 mg via ORAL
  Filled 2016-08-30 (×2): qty 2

## 2016-08-30 MED ORDER — METOCLOPRAMIDE HCL 10 MG PO TABS: 5.0000 mg | ORAL_TABLET | Freq: Three times a day (TID) | ORAL | Status: DC | PRN

## 2016-08-30 MED ORDER — LEVOTHYROXINE SODIUM 125 MCG PO TABS
125.0000 ug | ORAL_TABLET | Freq: Every day | ORAL | Status: DC
  Administered 2016-08-31: 125 ug via ORAL
  Filled 2016-08-30 (×3): qty 1

## 2016-08-30 MED ORDER — DULOXETINE HCL 60 MG PO CPEP
60.0000 mg | ORAL_CAPSULE | Freq: Every day | ORAL | Status: DC
  Administered 2016-08-30 – 2016-08-31 (×2): 60 mg via ORAL
  Filled 2016-08-30 (×3): qty 1

## 2016-08-30 MED ORDER — LABETALOL HCL 100 MG PO TABS: 50.0000 mg | ORAL_TABLET | Freq: Once | ORAL | Status: DC

## 2016-08-30 MED ORDER — LINACLOTIDE 145 MCG PO CAPS
290.0000 ug | ORAL_CAPSULE | Freq: Every day | ORAL | Status: DC
  Administered 2016-08-30 – 2016-08-31 (×2): 290 ug via ORAL
  Filled 2016-08-30: qty 2
  Filled 2016-08-30 (×2): qty 1

## 2016-08-30 MED ORDER — SODIUM CHLORIDE 0.9 % IV BOLUS (SEPSIS)
1000.0000 mL | Freq: Once | INTRAVENOUS | Status: AC
  Administered 2016-08-30: 1000 mL via INTRAVENOUS

## 2016-08-30 MED ORDER — IOPAMIDOL (ISOVUE-370) INJECTION 76%
INTRAVENOUS | Status: AC
  Administered 2016-08-30: 100 mL
  Filled 2016-08-30: qty 100

## 2016-08-30 MED ORDER — BACLOFEN 10 MG PO TABS: 10.0000 mg | ORAL_TABLET | Freq: Three times a day (TID) | ORAL | Status: DC

## 2016-08-30 MED ORDER — QUETIAPINE FUMARATE 50 MG PO TABS
100.0000 mg | ORAL_TABLET | Freq: Every day | ORAL | Status: DC
  Administered 2016-08-30: 100 mg via ORAL
  Filled 2016-08-30 (×2): qty 2

## 2016-08-30 MED ORDER — LABETALOL HCL 100 MG PO TABS: 50.0000 mg | ORAL_TABLET | Freq: Two times a day (BID) | ORAL | Status: DC

## 2016-08-30 MED ORDER — ASPIRIN 81 MG PO CHEW
324.0000 mg | CHEWABLE_TABLET | Freq: Once | ORAL | Status: AC
  Administered 2016-08-30: 324 mg via ORAL
  Filled 2016-08-30: qty 4

## 2016-08-30 MED ORDER — SODIUM CHLORIDE 0.9 % IV BOLUS (SEPSIS)
1000.0000 mL | Freq: Once | INTRAVENOUS | Status: AC
Start: 2016-08-30 — End: 2016-08-30
  Administered 2016-08-30: 1000 mL via INTRAVENOUS

## 2016-08-30 MED ORDER — METOCLOPRAMIDE HCL 5 MG/ML IJ SOLN: 5.0000 mg | Freq: Three times a day (TID) | INTRAMUSCULAR | Status: DC

## 2016-08-30 MED ORDER — HEPARIN SODIUM (PORCINE) 5000 UNIT/ML IJ SOLN
5000.0000 [IU] | Freq: Three times a day (TID) | INTRAMUSCULAR | Status: DC
  Administered 2016-08-30 – 2016-08-31 (×5): 5000 [IU] via SUBCUTANEOUS
  Filled 2016-08-30 (×4): qty 1

## 2016-08-30 MED ORDER — ONDANSETRON HCL 4 MG/2ML IJ SOLN: 4.0000 mg | Freq: Four times a day (QID) | INTRAMUSCULAR | Status: DC | PRN

## 2016-08-30 MED ORDER — ROSUVASTATIN CALCIUM 40 MG PO TABS
40.0000 mg | ORAL_TABLET | Freq: Every day | ORAL | Status: DC
Start: 2016-08-30 — End: 2016-08-30

## 2016-08-30 MED ORDER — ASPIRIN EC 81 MG PO TBEC
81.0000 mg | DELAYED_RELEASE_TABLET | Freq: Every day | ORAL | Status: DC
  Administered 2016-08-30 – 2016-08-31 (×2): 81 mg via ORAL
  Filled 2016-08-30 (×3): qty 1

## 2016-08-30 MED ORDER — METOCLOPRAMIDE HCL 5 MG/ML IJ SOLN: 5.0000 mg | Freq: Three times a day (TID) | INTRAMUSCULAR | Status: DC | PRN

## 2016-08-30 NOTE — Progress Notes (Signed)
FPTS Interim Progress Note  S: Pt states she is no longer having chest pain. She endorses headache that starts at the back of her neck and radiates up to her head.  O: BP 114/77 (BP Location: Left Arm)   Pulse 67   Temp 98 F (36.7 C) (Oral)   Resp 18   Ht 5\' 4"  (1.626 m)   Wt 143 lb (64.9 kg)   SpO2 99%   BMI 24.55 kg/m   General: Well-appearing, in NAD CV: RRR Resp: Normal work of breathing  A/P: -Awaiting ECHO -Seen by Cardiology today- if ECHO normal, can be discharged from cardiology standpoint -Will transfer out of stepdown tomorrow  Sela Hua, MD 08/30/2016, 9:03 PM PGY-2, [facility] pager [phone]

## 2016-08-30 NOTE — ED Notes (Signed)
Pt A&O x 4; CP now 3/10.  c/o headache 7/10, tylenol given.  NSR on monitor   Pt is holding for step down unit bed.

## 2016-08-30 NOTE — ED Notes (Signed)
Patient transported to CT 

## 2016-08-30 NOTE — Progress Notes (Signed)
Progress Note  Patient Name: [name] Rowe Date of Encounter: 08/30/2016  Primary Cardiologist: Skeet Latch MD  Subjective   Feels much better today. Chest pain, N/V, HA all resolved.   Inpatient Medications    Scheduled Meds: . aspirin EC  81 mg Oral Daily  . DULoxetine  60 mg Oral Daily  . heparin  5,000 Units Subcutaneous Q8H  . levothyroxine  125 mcg Oral QAC breakfast  . linaclotide  290 mcg Oral Daily  . QUEtiapine  100 mg Oral QHS   Continuous Infusions:  PRN Meds: acetaminophen   Vital Signs    Vitals:   08/30/16 0930 08/30/16 1000 08/30/16 1051 08/30/16 1315  BP: 90/70 94/66 109/77 114/77  Pulse: 66 64 76 67  Resp: 14 12  18   Temp:   98 F (36.7 C) 98 F (36.7 C)  TempSrc:   Oral Oral  SpO2: 99% 97% 98% 99%  Weight:   143 lb (64.9 kg)   Height:   5\' 4"  (1.626 m)     Intake/Output Summary (Last 24 hours) at 08/30/16 1348 Last data filed at 08/30/16 0525  Gross per 24 hour  Intake             1000 ml  Output                0 ml  Net             1000 ml   Filed Weights   08/30/16 1051  Weight: 143 lb (64.9 kg)    Telemetry    NSR - Personally Reviewed  ECG    NSR with normal Ecg - Personally Reviewed  Physical Exam   GEN: No acute distress.   Neck: No JVD Cardiac: RRR, no murmurs, rubs, or gallops.  Respiratory: Clear to auscultation bilaterally. GI: Soft, nontender, non-distended  MS: No edema; No deformity. Neuro:  Nonfocal  Psych: Normal affect   Labs    Chemistry Recent Labs Lab 08/29/16 2301 08/30/16 0625  NA 137 137  K 3.8 4.1  CL 101 105  CO2 28 25  GLUCOSE 106* 96  BUN 12 13  CREATININE 1.12* 1.15*  CALCIUM 9.2 8.1*  PROT  --  5.8*  ALBUMIN  --  3.5  AST  --  25  ALT  --  22  ALKPHOS  --  109  BILITOT  --  0.5  GFRNONAA 55* 53*  GFRAA >60 >60  ANIONGAP 8 7     Hematology Recent Labs Lab 08/29/16 2301  WBC 6.5  RBC 4.65  HGB 14.3  HCT 39.6  MCV 85.2  MCH 30.8  MCHC 36.1*  RDW 12.7    PLT 267    Cardiac Enzymes Recent Labs Lab 08/30/16 0307 08/30/16 0625 08/30/16 0858  TROPONINI <0.03 <0.03 <0.03    Recent Labs Lab 08/29/16 2320  TROPIPOC 0.00     BNP Recent Labs Lab 08/30/16 0322  BNP 14.9     DDimer No results for input(s): DDIMER in the last 168 hours.   Radiology    Dg Chest 2 View  Result Date: 08/29/2016 CLINICAL DATA:  Chest pain. EXAM: CHEST  2 VIEW COMPARISON:  Radiographs of June 17, 2016. FINDINGS: The heart size and mediastinal contours are within normal limits. Both lungs are clear. No pneumothorax or pleural effusion is noted. The visualized skeletal structures are unremarkable. IMPRESSION: No active cardiopulmonary disease. Electronically Signed   By: Marijo Conception, M.D.   On: 08/29/2016 23:23  Ct Angio Chest/abd/pel For Dissection W And/or W/wo  Result Date: 08/30/2016 CLINICAL DATA:  Hypotension. EXAM: CT ANGIOGRAPHY CHEST, ABDOMEN AND PELVIS TECHNIQUE: Multidetector CT imaging through the chest, abdomen and pelvis was performed using the standard protocol during bolus administration of intravenous contrast. Multiplanar reconstructed images and MIPs were obtained and reviewed to evaluate the vascular anatomy. CONTRAST:  100 cc Isovue 370 IV COMPARISON:  Chest radiograph yesterday.  Chest CT 10/24/2015 FINDINGS: CTA CHEST FINDINGS Cardiovascular: No aortic dissection, aneurysm, hematoma, or acute aortic syndrome. Heart is normal in size. No filling defects in the central pulmonary arteries to suggest pulmonary embolus. There is a conventional branching pattern from the aortic arch. Mediastinum/Nodes: No mediastinal or hilar adenopathy. Minimal thickening of the distal esophagus which is patulous. No pericardial effusion. Lungs/Pleura: Minimal dependent atelectasis. No consolidation to suggest pneumonia. No pulmonary edema. No pleural fluid. Musculoskeletal: There are no acute or suspicious osseous abnormalities. Review of the MIP images  confirms the above findings. CTA ABDOMEN AND PELVIS FINDINGS VASCULAR Aorta: Normal caliber aorta without aneurysm, dissection, vasculitis or significant stenosis. Celiac: Patent without evidence of aneurysm, dissection, vasculitis or significant stenosis. SMA: Patent without evidence of aneurysm, dissection, vasculitis or significant stenosis. Renals: Right renal artery stent appears patent. Left renal artery is patent. No evidence of renal artery dissection. IMA: Patent without evidence of aneurysm, dissection, vasculitis or significant stenosis. Inflow: Patent without evidence of aneurysm, dissection, vasculitis or significant stenosis. Veins: No obvious venous abnormality within the limitations of this arterial phase study. Review of the MIP images confirms the above findings. NON-VASCULAR Hepatobiliary: Focal fatty infiltration adjacent with falciform ligament. No evidence of focal lesion allowing for arterial phase contrast. Gallbladder physiologically distended, no calcified stone. No biliary dilatation. Pancreas: No ductal dilatation or inflammation. Spleen: Normal in size without focal abnormality. Adrenals/Urinary Tract: Normal adrenal glands. Scattered right renal scarring. Symmetric renal enhancement. No hydronephrosis or perinephric edema. Urinary bladder is physiologically distended. Stomach/Bowel: Stomach is within normal limits. Appendix is tentatively identified and appears normal. No evidence of bowel wall thickening, distention, or inflammatory changes. Lymphatic: No adenopathy. Reproductive: Status post hysterectomy. No adnexal masses. Other: No free air, free fluid, or intra-abdominal fluid collection. Small fat containing umbilical hernia. Musculoskeletal: Post a fusion L2-L3 with laminectomy. There are no acute or suspicious osseous abnormalities. Review of the MIP images confirms the above findings. IMPRESSION: No aortic dissection or acute vascular abnormality. No acute abnormality in the  chest, abdomen, or pelvis. Electronically Signed   By: Jeb Levering M.D.   On: 08/30/2016 05:15    Cardiac Studies   Echo pending  Patient Profile     56 y.o. female with hx of HTN, CVA 2006, fibromuscular dysplasia/RAS s/p RA stent 2009, and cath in 2009 in [place] showing no angiographic CAD, but evidence of catheter induced RCA spasm. 2016 LHC at [facility] showing 40-50% LAD stenosis, again treated for vasospasm. Now presents for evaluation of chest pain  Assessment & Plan    1. Chest pain. Patient states she first had a HA then developed chest discomfort in the left precordium. When she turned to her side she felt like something inflated in chest and she felt like blood flowing up inside her chest. She then developed N/V. Has never had this discomfort before. Now pain resolved. Ecg, troponin levels, BNP all normal CTA chest/abd/pelvis normal CXR normal  Patient's pain is quite atypical and different from any prior pain she has had. Doubt vasospasm or SCAD. Patient has low BP this  am. Labetalol and Cardizem on hold. Echo pending. If Echo normal she can be DC from out standpoint. Follow up in our office.   Signed, Peter Martinique, MD  08/30/2016, 1:48 PM

## 2016-08-30 NOTE — ED Notes (Signed)
Attempted report 

## 2016-08-30 NOTE — ED Notes (Signed)
MD Notified of BP readings she requested. Pt is having active chest pain and MD aware that patient cannot go to requested floor at this current time

## 2016-08-30 NOTE — ED Notes (Signed)
REPORT to Production assistant, radio.  Pt to go to 3W stepdown

## 2016-08-30 NOTE — ED Notes (Signed)
Family Medicine MD notified of the need to change patients level of care and bed status. MD changed the order and bed control was notified about patients condition.

## 2016-08-30 NOTE — H&P (Signed)
[facility] Admission History and Physical Service Pager: [phone]  Patient name: [name] Rowe Medical record number: [id number] Date of birth: 1960-12-15 Age: 56 y.o. Gender: female  Primary Care Provider: [name], MD Consultants: Cardiology Code Status: FULL    Chief Complaint:  Chest pain   Assessment and Plan: Lynora Egler is a 56 y.o. female presenting with chest pain. PMH is significant for h/o CVA, CAD, hypothyroidism, renal artery stenosis, fibromuscular dysplasia, hypertension, hyperlipidemia and anxiety.    Chest pain, r/o ACS:  Atypical chest pain, developed while laying down at rest.  Substernal, 10/10 centrally located, radiating to the back, associated with nausea and vomiting.  She notes that it feels similar to her previous episodes.  Prior to arrival to ED she called EMS and took SL Nitro x2 at home with no relief. On arrival, VSS however BP elevated to 171/116.  She was given Aspirin, Reglan and Benadryl.  At later evaluation systolic BPs in 829H so not given Nitro as she had taken this at home already.  Blood pressures were checked in both arms and no significant difference between the two. BMET with elevated glucose to 106 and Cr mildly elevated to 1.12 but labs otherwise unremarkable.  Heart Score of 4.  EKG with NSR and istat troponin 0.00.  CXR with no active cardiopulmonary disease.  Differentials include ACS, Cardiac Vasospasm, MSK, PE, Aortic Dissection and CHF exacerbation.   PE unlikely as Well's score of 0 and no history of recent immobility or long travel.  MSK also unlikely as chest pain not reproducible on exam.  BNP pending however lungs clear, no leg swelling or shortness of breath and no signs of fluid overload on exam.  Most recent Echo 2013 with EF 55-60%, G1DD. Patient with hx of vasospasm noted on last CATH, possibly contributing to chest pain vs ACS. Will rule out Aortic dissection, given patient's drop in blood pressure,  no other reason for shock including ischemic, hypovolemic, or distributive shock. Other possible reasons for hypotension include nitroglycerine - however this was relatively remote   -In setting of active chest pain, will admit to stepdown, attending Dr. Ardelia Mems -Cardiology consulted after seeing patient in ED.  Per recs, given 1L NS bolus.  Will recheck BP and if no improvement can give another 1L NS bolus.  -Due to hypotension, will obtain CT angio w and wo contrast to rule out dissection  -Trend troponins  -Continuous cardiac monitoring   -AM EKG -AM BMET  -Aspirin 81 mg daily  -Tylenol 650 mg Q4 prn -Reglan 5 mg Q8 prn  -continuous pulse ox -vitals per unit routine  -Risk start labs - A1C and lipids, (TSH recently check in 04/2016)  H/o CAD.  Last cath 2016 with 40-50% stenosis of LAD. Also noted some vasospasm of radial artery at that time and suspected coronary vasospasm.  Patient follows with Dr. Oval Linsey (Cardiology) and was recently seen June 21, 2016.  At that time suggested to change Procardia to Diltiazem to help her recurrent symptoms.  Most recent echo 2013 with EF 55-60%, G1DD.  -Consider repeat echo - Holding Diltiazem 240 mg until   Hypotension:  On arrival to ED 171/116 with BP 89/68 4 hours later.  In setting of hypotension, concern for aortic dissection.  Hgb 14.3, no sign or symptoms of bleeding, therefore unlikely hypovolemic shock. EKG and Trop 0.0, no fluid overload noted on exam, therefore unlikely cardiogenic. No signs or symptoms point towards distributive shock. Patient took 2  nitroglycerin tablets sublingual around 9 PM before coming to  -Given 1L NS bolus which improved pressures to 105/76 -Consider additional bolus if necessary   Hx of HTN with Renal Artery Stenosis due to fibromuscular dysplasia. -Holding Labetalol 200 mg BID  Migraine headache:  Reporting occasional headaches tonight which feel like a pressure behind her eyes.  No vision changes.  Took  Naproxen and Tramadol at home for this prior to coming in.  Given 4 mg Zofran en route.  -Hold NSAIDs in setting of ongoing chest pain  -Do not give Zofran as she has prolonged QTc. Reglan ordered.  -Tylenol 650 Q4 prn  -Will continue to monitor   Hypothyroidism, stable.  Last TSH in 04/2016, 0.72 wnl.  At home on Synthroid.  -Continue home Synthroid 125 mcg   H/o CVA, stable MRI in 2011 and 2015 - remote lacunar infarct.  No new changes.    Prolonged QTc.  Noted on EKG on admission.  -Avoid medications that prolong QT   HLD.  At home on Crestor 40 mg daily -Continue home med  Fibromyalgia.  At home on Baclofen -Continue home Baclofen 10 mg daily  Anxiety/depression, stable.  At home on Cymbalta 60 mg and Seroquel 100 mg.  -Continue home meds  Constipation -Continue home Linzess  FEN/GI: Heart healthy, IV NS Bolus x1 Prophylaxis: Heparin   Disposition: Admit to stepdown, attending Ardelia Mems.   History of Present Illness:  [name] Rowe is a 56 y.o. female presenting with chest pain tonight.   Patient states she was laying down/resting and developed chest pain. She noted she felt like her heart was beating fast. Patient took nitroglycerin which didn't help. Denies eating any spice food, denies burning sensation in chest, or burping. Patient denies any radiation to jaw. Patient indicates chest pain radiates to the back. Indicates pain was 10/10, felt like a mid sternal chest pressure. Denies any reproducible pain. Vomiting x 2 at home. Denies any diaphoresis. Indicates feeling dizziness during this onset of patient's chest pain. Patient indicates some SOB.  No recent swelling in legs. Currently patient's chest pain is 5 out of 10. Patient indicates this is similar to previous episodes of chest pain. Patient denies any fever or chills. She denies any recent travel or immobilization. Patient denies orthopnea and PND. No hx of a clot.   Patient currently most significant pain in neck  and with migraine and with headache  Patient took a tramadol and naproxen for pain   Review Of Systems: Per HPI with the following additions:   Review of Systems  Constitutional: Negative for chills and fever.  HENT: Negative for congestion and sore throat.   Eyes: Negative for blurred vision.  Respiratory: Positive for shortness of breath. Negative for cough, sputum production and wheezing.   Cardiovascular: Positive for chest pain. Negative for palpitations, orthopnea, leg swelling and PND.  Gastrointestinal: Positive for nausea and vomiting. Negative for abdominal pain and heartburn.  Genitourinary: Negative for dysuria.  Musculoskeletal: Positive for neck pain.  Neurological: Negative for weakness.   Patient Active Problem List   Diagnosis Date Noted  . ACS (acute coronary syndrome) ([place]) 08/30/2016  . Fatigue 04/07/2016  . Neck pain on left side 03/16/2016  . Acute left-sided thoracic back pain 03/16/2016  . Pain of left breast 10/30/2015  . Itching 08/26/2014  . Lichen sclerosus 77/41/2878  . Extremity edema 06/24/2014  . History of hypokalemia 04/22/2014  . Diplopia   . Chest pain 04/12/2014  . Bursitis, trochanteric 11/22/2013  .  Essential hypertension 11/19/2013  . Acute low back pain 08/10/2013  . Left-sided weakness 08/07/2013  . Personal history of colonic polyps-sessile serrated adenoma 02/23/2013  . HNP (herniated nucleus pulposus), lumbar 12/01/2012  . Lumbar radicular syndrome 12/01/2012  . Seasonal allergies 11/30/2012  . Intervertebral disc protrusion 11/06/2012  . Situational anxiety 09/05/2012  . Vitamin D insufficiency 06/30/2012  . Unilateral hearing loss 06/30/2012  . Vestibular dizziness 06/30/2012  . Routine adult health maintenance 03/31/2012  . Fibromuscular dysplasia ([place]) 08/13/2011  . Poorly-controlled hypertension   . History of CVA (cerebrovascular accident)   . Coronary vasospasm ([place])   . Hypothyroidism 04/10/2010  . Hyperlipidemia  04/10/2010  . Anxiety 04/10/2010  . Depression 04/10/2010  . Migraine headache 04/10/2010  . RENAL ARTERY STENOSIS 04/10/2010  . Fibromyalgia 04/10/2010  . Status post lumbar surgery 04/10/2010  . Backache 04/09/2010   Past Medical History: Past Medical History:  Diagnosis Date  . Adenomyosis   . Anxiety 2001  . Chest pain    a. No angiographic CAD by cath 2009 in [place]. There was catheter-induced RCA vasospasm versus microvascular obstruction. bCarlton Adam myoview 04/2010 - EF 79%, normal perfusion.;  c. ETT-Myoview 7/13: no ischemia, EF 77%, submax exercise  . Chronic back pain    had 3 epidural injections on 02-14-13  . Complication of anesthesia    has difficulty voiding after surgery  . Coronary vasospasm ([place])   . CVA (cerebral vascular accident) ([place]) 2006   lost hearing on left  . Depression 2001  . Fibromuscular dysplasia ([place]) 2009   a. Renal artery stenosis due to this. b. Carotid dopplers 07/2011 c/w FMD but no significant stenosis  . Fibromyalgia 1999  . H/O Graves' disease 2003   s/p radio-iodine treatment in 2003   . Hearing loss of left ear    s/p CVA 2006  . HLD (hyperlipidemia) 2000  . HTN (hypertension) 1988   a. Likely related to RAS; urinary metanephrines and plasma renin/aldosterone ratio normal. b. H/o HTN urgency 06/2011 after being off BP meds for a number of days  . Hx of echocardiogram    a. Echo (1/13): EF 16-10%, grade 1 diastolic dysfunction  . Hypertensive crisis 06/29/2011  . Hypothyroidism 2003   Graves disease s/p radio-iodine treatment in 2003   . Irritable bowel syndrome   . Leiomyoma   . Migraine headache   . Myocardial infarction 2009  . Personal history of colonic polyps-sessile serrated adenoma 02/23/2013  . Renal artery stenosis ([place])    a. Due to fibromuscular dysplasia. b. Renal stent 2009. c. Studies:  CTA abdomen (1/13) with evidence for fibromuscular dysplasia, right renal artery stent appears patent. Renal artery dopplers  (2/13) with FMD but no evidence for signficant stenosis.   . Tachycardia 2000   10/27/10-Holter -NSR with occ sinus tachy-rare PACs, no sig arrhythmia   Past Surgical History: Past Surgical History:  Procedure Laterality Date  . BACK SURGERY  2010 and 2011   In CT.  11/19/2008 (possibly a laminectomy) and in May of 2011 she underwent a L2-L3 fusion.  . cardiac cath    . CARDIAC CATHETERIZATION  2009   states it was normal  . CARDIAC CATHETERIZATION N/A 02/03/2015   Procedure: Left Heart Cath and Coronary Angiography;  Surgeon: Larey Dresser, MD;  Location: [facility];  Service: Cardiovascular;  Laterality: N/A;  . COLONOSCOPY    . COLONOSCOPY    . HIP SURGERY Bilateral 1988 and 1989   "scraped head of  femur"  . KNEE ARTHROSCOPY Left   . LUMBAR LAMINECTOMY/DECOMPRESSION MICRODISCECTOMY Left 05/25/2013   Procedure: LUMBAR LAMINECTOMY/DECOMPRESSION MICRODISCECTOMY 1 LEVEL;  Surgeon: Winfield Cunas, MD;  Location: [facility];  Service: Neurosurgery;  Laterality: Left;  LEFT L5S1 microdiskectomy  . RENAL ARTERY STENT  2009  . ROBOTIC ASSISTED LAP VAGINAL HYSTERECTOMY  05/13/2008   [facility] hysterectomy  for myomatous uterus   . SPINE SURGERY    . VAGINAL HYSTERECTOMY     Social History: Social History  Substance Use Topics  . Smoking status: Never Smoker  . Smokeless tobacco: Never Used  . Alcohol use Yes     Comment: rarely   Family History: Family History  Problem Relation Age of Onset  . Hypertension Mother   . Asthma Mother   . Hyperlipidemia Mother   . Stroke Mother   . Hypertension Father   . Prostate cancer Father   . Heart attack Father   . Hyperlipidemia Father   . Stroke Father   . Heart attack    . Stroke    . Hypertension Brother   . Heart attack Brother 86  . Early death Brother 34    massive stroke   . Hyperlipidemia Brother   . Metabolic syndrome Sister   . Depression Sister   . Hypertension Sister   . Diabetes Maternal Aunt   . Colon  cancer Neg Hx   . Stomach cancer Neg Hx    Allergies and Medications: Allergies  Allergen Reactions  . Hydromorphone Hcl Other (See Comments)    Palpitations  . Prochlorperazine Edisylate Other (See Comments)    Causes Seizures   . Sumatriptan Palpitations  . Atorvastatin Other (See Comments)    Joint pain  . Tramadol Anxiety   No current facility-administered medications on file prior to encounter.    Current Outpatient Prescriptions on File Prior to Encounter  Medication Sig Dispense Refill  . aspirin EC 81 MG tablet Take 81 mg by mouth daily.    Marland Kitchen diltiazem (DILACOR XR) 240 MG 24 hr capsule Take 1 capsule (240 mg total) by mouth daily. 90 capsule 3  . DULoxetine (CYMBALTA) 60 MG capsule Take 1 capsule (60 mg total) by mouth daily. 30 capsule 2  . labetalol (NORMODYNE) 200 MG tablet Take 1 tablet (200 mg total) by mouth 2 (two) times daily. 180 tablet 0  . levothyroxine (SYNTHROID, LEVOTHROID) 125 MCG tablet Take 1 tablet (125 mcg total) by mouth daily. 90 tablet 3  . linaclotide (LINZESS) 290 MCG CAPS capsule Take 1 capsule (290 mcg total) by mouth daily. 30 capsule 11  . nitroGLYCERIN (NITROSTAT) 0.4 MG SL tablet Place 1 tablet (0.4 mg total) under the tongue every 5 (five) minutes as needed. x3 doses as needed for chest pain 100 tablet 3  . QUEtiapine (SEROQUEL) 100 MG tablet Take 100 mg by mouth at bedtime.    . traZODone (DESYREL) 100 MG tablet Take 100 mg by mouth at bedtime.    . baclofen (LIORESAL) 10 MG tablet Take 1 tablet (10 mg total) by mouth 3 (three) times daily. (Patient not taking: Reported on 08/29/2016) 30 each 0  . naproxen (NAPROSYN) 500 MG tablet Take 1 tablet (500 mg total) by mouth 2 (two) times daily with a meal. (Patient not taking: Reported on 08/29/2016) 30 tablet 0  . rosuvastatin (CRESTOR) 40 MG tablet Take 1 tablet (40 mg total) by mouth daily. (Patient not taking: Reported on 08/29/2016) 30 tablet 0  . triamcinolone cream (  KENALOG) 0.1 % Apply 1  application topically 2 (two) times daily as needed (rash/ irritation). (Patient not taking: Reported on 08/29/2016) 30 g 1   Objective: BP 107/80   Pulse 65   Temp 98.2 F (36.8 C) (Oral)   Resp 12   SpO2 98%  Exam: General: 56 yo F laying in hospital bed, in NAD  Eyes: PERRL, EOMI ENTM: NCAT, MMM, oropharynx clear Neck: supple, no JVD Cardiovascular: RRR, no MRG, palpable pulses Respiratory: CTA B/L, no wheezes noted, comfortable work of breathing on RA Gastrointestinal: soft, NT, ND, no masses, +bs MSK: no edema or tenderness noted Derm: skin is warm and dry Neuro: AAOx3, no focal deficits, strength 5/5 in bilateral upper and lower extremities  Psych: normal affect  Labs and Imaging: CBC BMET   Recent Labs Lab 08/29/16 2301  WBC 6.5  HGB 14.3  HCT 39.6  PLT 267    Recent Labs Lab 08/29/16 2301  NA 137  K 3.8  CL 101  CO2 28  BUN 12  CREATININE 1.12*  GLUCOSE 106*  CALCIUM 9.2     Dg Chest 2 View  Result Date: 08/29/2016 CLINICAL DATA:  Chest pain. EXAM: CHEST  2 VIEW COMPARISON:  Radiographs of June 17, 2016. FINDINGS: The heart size and mediastinal contours are within normal limits. Both lungs are clear. No pneumothorax or pleural effusion is noted. The visualized skeletal structures are unremarkable. IMPRESSION: No active cardiopulmonary disease. Electronically Signed   By: Marijo Conception, M.D.   On: 08/29/2016 23:23   Lovenia Kim, MD 08/30/2016, 4:33 AM PGY-1, [facility] pager: [phone], text pages welcome  UPPER LEVEL ADDENDUM  I have read the above note and made revisions highlighted in blue.  Kerrin Mo, MD, PGY-2 [facility]

## 2016-08-30 NOTE — Consult Note (Signed)
Patient ID: [name] Rowe MRN: [id number], DOB/AGE: 1960/11/01  Admit date: 08/29/2016 Primary Physician: [name], MD Primary Inpatient Attending: Ardelia Mems, MD Primary Cardiologist: Oval Linsey  CARDIOLOGY CONSULT NOTE   PATIENT PROFILE   56 y/o female with hx of HTN, CVA 2006, fibromuscular dysplasia/RAS s/p RA stent 2009, and cath in 2009 in [place] showing no angiographic CAD, but evidence of catheter induced RCA spasm. 2016 LHC at [facility] showing 40-50% LAD stenosis, again treated for vasospasm. She has had intermittent chest pain due to  coronary vasospasm vs microsvascular obstruction; she follows with Patric Dykes, PA and Dr. Oval Linsey. She is treated with Diltiazem and Labetalol. She has tried long acting nitrates, but was unable to tolerate these medications due to headache.   HISTORY OF PRESENT ILLNESS   The evening of 3/25 the patient suffered from a headache. At about 9pm she also started to experience left sided chest pain. Nonradiating. She experienced nausea and subsequently vomited twice. She self-administered Nitro x 2 and Labtalol (unsure how much, but she thinks only one extra tab) with mild improvement in her symptoms so she came to the ED. On arrival her chest pain is now "much better" (4/10 from 10/10) after receiving Aspirin, Reglan, and Zofran.  She denies fevers, chills, or travel. No new rashes or joint swelling/redness. Her nausea has subsided. She has chronic constipation. She has mild occasional dysuria, but none currently. No leg swelling. No syncope. She does occasionally get lightheaded when walking around or going from sitting to standing, but this has not happened in past 24 hours. No recent syncope.  She states that her BP is usually high with systolics in 354-656C. It was high this evening and first check in the ED was 171/116. It subsequently decreased, as low as 70/50. When I interviewed her, it was 105/76. She admits that she occasionally forgets to  take her medications. She denies any additional OTCs, supplements, or recreational drugs. Her medications are with her in the ED.   Review of Systems Ten systems reviewed and are otherwise negative except as noted above.   MEDICAL, FAMILY, AND SOCIAL HISTORY   Past Medical History:  Diagnosis Date  . Adenomyosis   . Anxiety 2001  . Chest pain    a. No angiographic CAD by cath 2009 in [place]. There was catheter-induced RCA vasospasm versus microvascular obstruction. bCarlton Adam myoview 04/2010 - EF 79%, normal perfusion.;  c. ETT-Myoview 7/13: no ischemia, EF 77%, submax exercise  . Chronic back pain    had 3 epidural injections on 02-14-13  . Complication of anesthesia    has difficulty voiding after surgery  . Coronary vasospasm ([place])   . CVA (cerebral vascular accident) ([place]) 2006   lost hearing on left  . Depression 2001  . Fibromuscular dysplasia ([place]) 2009   a. Renal artery stenosis due to this. b. Carotid dopplers 07/2011 c/w FMD but no significant stenosis  . Fibromyalgia 1999  . H/O Graves' disease 2003   s/p radio-iodine treatment in 2003   . Hearing loss of left ear    s/p CVA 2006  . HLD (hyperlipidemia) 2000  . HTN (hypertension) 1988   a. Likely related to RAS; urinary metanephrines and plasma renin/aldosterone ratio normal. b. H/o HTN urgency 06/2011 after being off BP meds for a number of days  . Hx of echocardiogram    a. Echo (1/13): EF 12-75%, grade 1 diastolic dysfunction  . Hypertensive crisis 06/29/2011  . Hypothyroidism 2003   Graves  disease s/p radio-iodine treatment in 2003   . Irritable bowel syndrome   . Leiomyoma   . Migraine headache   . Myocardial infarction 2009  . Personal history of colonic polyps-sessile serrated adenoma 02/23/2013  . Renal artery stenosis ([place])    a. Due to fibromuscular dysplasia. b. Renal stent 2009. c. Studies:  CTA abdomen (1/13) with evidence for fibromuscular dysplasia, right renal artery stent appears patent. Renal  artery dopplers (2/13) with FMD but no evidence for signficant stenosis.   . Tachycardia 2000   10/27/10-Holter -NSR with occ sinus tachy-rare PACs, no sig arrhythmia    Past Surgical History:  Procedure Laterality Date  . BACK SURGERY  2010 and 2011   In CT.  11/19/2008 (possibly a laminectomy) and in May of 2011 she underwent a L2-L3 fusion.  . cardiac cath    . CARDIAC CATHETERIZATION  2009   states it was normal  . CARDIAC CATHETERIZATION N/A 02/03/2015   Procedure: Left Heart Cath and Coronary Angiography;  Surgeon: Larey Dresser, MD;  Location: [place] [facility];  Service: Cardiovascular;  Laterality: N/A;  . COLONOSCOPY    . COLONOSCOPY    . HIP SURGERY Bilateral 1988 and 1989   "scraped head of femur"  . KNEE ARTHROSCOPY Left   . LUMBAR LAMINECTOMY/DECOMPRESSION MICRODISCECTOMY Left 05/25/2013   Procedure: LUMBAR LAMINECTOMY/DECOMPRESSION MICRODISCECTOMY 1 LEVEL;  Surgeon: Winfield Cunas, MD;  Location: [place] [facility];  Service: Neurosurgery;  Laterality: Left;  LEFT L5S1 microdiskectomy  . RENAL ARTERY STENT  2009  . ROBOTIC ASSISTED LAP VAGINAL HYSTERECTOMY  05/13/2008   [facility] [place] hysterectomy  for myomatous uterus   . SPINE SURGERY    . VAGINAL HYSTERECTOMY      Allergies  Allergen Reactions  . Hydromorphone Hcl Other (See Comments)    Palpitations  . Prochlorperazine Edisylate Other (See Comments)    Causes Seizures   . Sumatriptan Palpitations  . Atorvastatin Other (See Comments)    Joint pain  . Tramadol Anxiety   Prior to Admission medications   Medication Sig Start Date End Date Taking? Authorizing Provider  aspirin EC 81 MG tablet Take 81 mg by mouth daily.   Yes Historical Provider, MD  diltiazem (DILACOR XR) 240 MG 24 hr capsule Take 1 capsule (240 mg total) by mouth daily. 06/21/16  Yes Luke K Kilroy, PA-C  DULoxetine (CYMBALTA) 60 MG capsule Take 1 capsule (60 mg total) by mouth daily. 07/29/16  Yes [name], MD  labetalol (NORMODYNE) 200 MG tablet  Take 1 tablet (200 mg total) by mouth 2 (two) times daily. 03/22/16  Yes [name], MD  levothyroxine (SYNTHROID, LEVOTHROID) 125 MCG tablet Take 1 tablet (125 mcg total) by mouth daily. 03/22/16  Yes [name], MD  linaclotide (LINZESS) 290 MCG CAPS capsule Take 1 capsule (290 mcg total) by mouth daily. 10/30/15  Yes Patrecia Pour, MD  nitroGLYCERIN (NITROSTAT) 0.4 MG SL tablet Place 1 tablet (0.4 mg total) under the tongue every 5 (five) minutes as needed. x3 doses as needed for chest pain 03/22/16  Yes [name], MD  QUEtiapine (SEROQUEL) 100 MG tablet Take 100 mg by mouth at bedtime.   Yes Historical Provider, MD  traZODone (DESYREL) 100 MG tablet Take 100 mg by mouth at bedtime.   Yes Historical Provider, MD  baclofen (LIORESAL) 10 MG tablet Take 1 tablet (10 mg total) by mouth 3 (three) times daily. Patient not taking: Reported on 08/29/2016 07/09/16  [name], MD  naproxen (NAPROSYN) 500 MG tablet Take 1 tablet (500 mg total) by mouth 2 (two) times daily with a meal. Patient not taking: Reported on 08/29/2016 07/09/16   [name], MD  rosuvastatin (CRESTOR) 40 MG tablet Take 1 tablet (40 mg total) by mouth daily. Patient not taking: Reported on 08/29/2016 08/28/15   Patrecia Pour, MD  triamcinolone cream (KENALOG) 0.1 % Apply 1 application topically 2 (two) times daily as needed (rash/ irritation). Patient not taking: Reported on 08/29/2016 05/04/16   [name], MD   Family History  Problem Relation Age of Onset  . Hypertension Mother   . Asthma Mother   . Hyperlipidemia Mother   . Stroke Mother   . Hypertension Father   . Prostate cancer Father   . Heart attack Father   . Hyperlipidemia Father   . Stroke Father   . Heart attack    . Stroke    . Hypertension Brother   . Heart attack Brother 92  . Early death Brother 3    massive stroke   . Hyperlipidemia Brother   . Metabolic syndrome Sister   . Depression Sister   . Hypertension Sister   . Diabetes Maternal Aunt     . Colon cancer Neg Hx   . Stomach cancer Neg Hx    Social History   Social History  . Marital status: Divorced    Spouse name: N/A  . Number of children: 2  . Years of education: N/A   Occupational History  . worked in a Development worker, international aid. Unemployed    Quit about a yr ago because fo work related injury to left knee   Social History Main Topics  . Smoking status: Never Smoker  . Smokeless tobacco: Never Used  . Alcohol use Yes     Comment: rarely  . Drug use: No  . Sexual activity: Not on file   Other Topics Concern  . Not on file   Social History Narrative   Second Husband is currently in jail for an offense committed in [place] and he is awaiting extradition.   Lives alone.    Jehovah's witness.    Walks 20-30 minutes.                   PHYSICAL EXAM  Blood pressure 105/76, pulse 73, temperature 98.2 F (36.8 C), temperature source Oral, resp. rate 18, SpO2 99 %.  General: Pleasant, NAD Psych: Normal affect. Neuro: Alert and oriented X 3. Moves all extremities spontaneously. HEENT: Sclera anicteric, noninjected. MMM.  Neck: No bruits. JVP is not elevated. Lungs:  Normal WOB, CTABL Heart: RRR no m/r/g Abdomen: Soft, non-tender, non-distended, BS + x 4.  Extremities: No clubbing or cyanosis. Edema: none. DP/PT/Radials 2+ and equal bilaterally.   LABS and STUDIES  Troponin (Point of Care Test)  Recent Labs  08/29/16 2320  TROPIPOC 0.00    Recent Labs  08/30/16 0307  TROPONINI <0.03   Lab Results  Component Value Date   WBC 6.5 08/29/2016   HGB 14.3 08/29/2016   HCT 39.6 08/29/2016   MCV 85.2 08/29/2016   PLT 267 08/29/2016    Recent Labs Lab 08/29/16 2301  NA 137  K 3.8  CL 101  CO2 28  BUN 12  CREATININE 1.12*  CALCIUM 9.2  GLUCOSE 106*   Lab Results  Component Value Date   CHOL 148 06/24/2014   HDL 51.90 06/24/2014   LDLCALC 75 06/24/2014  TRIG 104.0 06/24/2014   Lab Results  Component Value Date   DDIMER 0.36  04/12/2014     Radiology/Studies. Independently Reviewed CXR 08/29/2016: Clear  ECG was independently reviewed. Sinus rhythm, Vent Rate 70, no ischemic changes, prolonged QT  Echocardiogram  06/2011 Wall thickness was increased in a pattern of mild LVH.  Systolic function was normal. The estimated ejection fraction was in the range of 55% to 60%. Wall motion was normal; there were no regional wall motion abnormalities. Doppler parameters are consistent with abnormal left ventricular relaxation (grade 1 diastolic dysfunction). There was no evidence of elevated ventricular filling pressure by Doppler parameters.   ASSESSMENT and RECOMMENDATIONS   56 y/o female with hx of HTN, CVA (remote lacunar infarct), fibromuscular dysplasia/RAS s/p RA stent 2009, coronary vasospasm, who presents with headache and chest pain. Now with decreased blood pressure.  Chest pain Etiology is likely vasospasm, although women with FMD are at risk for spontaneous coronary dissection. Chest pain has vastly improved since admission. She does have a degree of chronic chest pain. As noted, normal ECG and negative troponin do not suggest ischemia. She is resting comfortably. - Once BP stabilizes, increase Dilt to 360 daily - Continue Labetolol 200 mg BID - Unable to tolerate nitrates - I would not treat ACS unless she develops objective signs such as elevated troponin or ischemic ECG changes - Continue Statin  Hypotension Her low blood pressure is likely iatrogenic, from nitroglycerine + labetalol. She is mentating and perfusing well. No ECG changes to suggest ischemia. She does not have indications of septic/distributive shock or bleeding. She denies any illicit substances. She is receiving bolus normal saline with improvement in BP.  I anticipate her blood pressure will continue to slowly rise over the next several hours.  Heath Tesler D, MD 08/30/2016, 4:13 AM

## 2016-08-30 NOTE — ED Notes (Signed)
Consulting MD at bedside

## 2016-08-31 ENCOUNTER — Observation Stay (HOSPITAL_BASED_OUTPATIENT_CLINIC_OR_DEPARTMENT_OTHER): Payer: Medicaid Other

## 2016-08-31 DIAGNOSIS — I959 Hypotension, unspecified: Secondary | ICD-10-CM | POA: Diagnosis not present

## 2016-08-31 DIAGNOSIS — R079 Chest pain, unspecified: Secondary | ICD-10-CM

## 2016-08-31 DIAGNOSIS — I249 Acute ischemic heart disease, unspecified: Secondary | ICD-10-CM | POA: Diagnosis not present

## 2016-08-31 DIAGNOSIS — I200 Unstable angina: Secondary | ICD-10-CM | POA: Diagnosis not present

## 2016-08-31 LAB — ECHOCARDIOGRAM COMPLETE
E/e' ratio: 19.9
FS: 31 % (ref 28–44)
HEIGHTINCHES: 64 in
IV/PV OW: 0.92
LA diam end sys: 33 mm
LA diam index: 1.92 cm/m2
LA vol A4C: 42.5 ml
LA vol index: 24.1 mL/m2
LA vol: 41.4 mL
LASIZE: 33 mm
LDCA: 2.84 cm2
LV E/e' medial: 19.9
LV E/e'average: 19.9
LV TDI E'LATERAL: 4.12
LV TDI E'MEDIAL: 5.22
LV e' LATERAL: 4.12 cm/s
LVOT SV: 54 mL
LVOT VTI: 19 cm
LVOTD: 19 mm
LVOTPV: 88.3 cm/s
MV pk A vel: 127 m/s
MV pk E vel: 82 m/s
MVPG: 3 mmHg
PW: 9.26 mm — AB (ref 0.6–1.1)
RV LATERAL S' VELOCITY: 14.3 cm/s
TAPSE: 24.8 mm
Weight: 2360 oz

## 2016-08-31 LAB — CBC
HEMATOCRIT: 35.3 % — AB (ref 36.0–46.0)
Hemoglobin: 12.3 g/dL (ref 12.0–15.0)
MCH: 30.4 pg (ref 26.0–34.0)
MCHC: 34.8 g/dL (ref 30.0–36.0)
MCV: 87.4 fL (ref 78.0–100.0)
Platelets: 222 10*3/uL (ref 150–400)
RBC: 4.04 MIL/uL (ref 3.87–5.11)
RDW: 12.8 % (ref 11.5–15.5)
WBC: 4.2 10*3/uL (ref 4.0–10.5)

## 2016-08-31 LAB — HEMOGLOBIN A1C
Hgb A1c MFr Bld: 5.4 % (ref 4.8–5.6)
MEAN PLASMA GLUCOSE: 108 mg/dL

## 2016-08-31 LAB — BASIC METABOLIC PANEL
Anion gap: 9 (ref 5–15)
BUN: 15 mg/dL (ref 6–20)
CO2: 21 mmol/L — ABNORMAL LOW (ref 22–32)
Calcium: 8.3 mg/dL — ABNORMAL LOW (ref 8.9–10.3)
Chloride: 108 mmol/L (ref 101–111)
Creatinine, Ser: 1.18 mg/dL — ABNORMAL HIGH (ref 0.44–1.00)
GFR calc Af Amer: 59 mL/min — ABNORMAL LOW (ref >60)
GFR calc non Af Amer: 51 mL/min — ABNORMAL LOW (ref >60)
Glucose, Bld: 94 mg/dL (ref 65–99)
Potassium: 4.1 mmol/L (ref 3.5–5.1)
Sodium: 138 mmol/L (ref 135–145)

## 2016-08-31 MED ORDER — CARVEDILOL 3.125 MG PO TABS
3.1250 mg | ORAL_TABLET | Freq: Two times a day (BID) | ORAL | Status: DC
  Administered 2016-08-31 (×2): 3.125 mg via ORAL
  Filled 2016-08-31 (×2): qty 1

## 2016-08-31 MED ORDER — CARVEDILOL 3.125 MG PO TABS: 3.1250 mg | ORAL_TABLET | Freq: Two times a day (BID) | ORAL | 0 refills | Status: DC

## 2016-08-31 MED ORDER — DILTIAZEM HCL ER 180 MG PO CP24
360.0000 mg | ORAL_CAPSULE | Freq: Every day | ORAL | 0 refills | Status: DC
Start: 2016-08-31 — End: 2016-09-10

## 2016-08-31 NOTE — Progress Notes (Signed)
Family Medicine Teaching Service Daily Progress Note Intern Pager: [phone]  Patient name: [name] Rowe Medical record number: [id number] Date of birth: [birth year] Age: 56 y.o. Gender: female  Primary Care Provider: [name], MD Consultants: Cardiology  Code Status: FULL   Pt Overview and Major Events to Date:  Admit 3/26  Assessment and Plan: Chest pain, resolved and ACS ruled out:   She notes that it feels similar to her previous episodes.  Most recent Echo 2013 with EF 55-60%, G1DD. Patient with hx of vasospasm noted on last cath, possibly contributing to chest pain vs ACS.  Troponins negative and EKG with no ischemic changes.   -Cardiology consulted, appreciate recs - likely related to history of coronary vasospasm.  Would not treat ACS unless objective findings ie elevated troponins or ischemic EKG findings. Okay to discharge home from cardiac standpoint if echo is normal.   -Risk start labs:  A1c 5.4%, TSH recently checked 04/2016 and wnl. Lipid panel with total cholesterol 268, LDL 182 and TG 202.  Patient on Crestor 40 mg daily at home but med is reported as "not taking".  Recommend continue statin.    -On discharge consider adding long-acting nitrate vs ?fluvastatin.  -Echo pending -Transfer to med-surg  -Aspirin 81 mg daily  -Tylenol 650 mg Q4 prn -Reglan 5 mg Q8 prn  -Continuous cardiac monitoring   -continuous pulse ox -vitals per unit routine   H/o CAD.  Last cath 2016 with 40-50% stenosis of LAD. Also noted some vasospasm of radial artery at that time and suspected coronary vasospasm.  Patient follows with Dr. Oval Linsey (Cardiology) and was recently seen June 21, 2016.  At that time suggested to change Procardia to Diltiazem to help her recurrent symptoms.  Most recent echo 2013 with EF 55-60%, G1DD.  -follow up repeat echo.  Stable to d/c home if normal.   - Holding Diltiazem 240 mg.  Per cards, increase home diltiazem to 360 mg daily when BP tolerates.    Hypotension, resolved:  Unclear etiology but likely iatrogenic from nitroglycerin and labetalol.  Her home dose labetalol is 200 mg.  BP this AM 141/92.   Hx of HTN with Renal Artery Stenosis due to fibromuscular dysplasia. -BP trending up overnight 133/85 with HR 70.  Added back Labetalol 50 mg BID and BP improved to 116/73.  -Switched labetalol this AM to Coreg 3.125 mg BID.  -Continue to monitor blood pressure  Migraine headache:  Reporting occasional headaches tonight which feel like a pressure behind her eyes.  No vision changes.  Took Naproxen and Tramadol at home for this prior to coming in.  Given 4 mg Zofran en route.  -Hold NSAIDs in setting of ongoing chest pain  -Do not give Zofran as she has prolonged QTc.  -Tylenol 650 Q4 prn  -Will continue to monitor   Hypothyroidism, stable.  Last TSH in 04/2016, 0.72 wnl.  At home on Synthroid.  -Continue home Synthroid 125 mcg   H/o CVA, stable MRI in 2011 and 2015 - remote lacunar infarct.  No new changes.    Prolonged QTc.  Noted on EKG on admission.  -Avoid medications that prolong QT   HLD.  At home on Crestor 40 mg daily -Continue home med  Fibromyalgia.  At home on Baclofen -Continue home Baclofen 10 mg daily  Anxiety/depression, stable.  At home on Cymbalta 60 mg and Seroquel 100 mg.  -Continue home meds  Constipation -Continue home Linzess  FEN/GI: Heart healthy, IV NS Bolus x1  Prophylaxis: Heparin    Disposition: Dispo pending echo result.   Subjective:  Patient denies chest pain and feels well this morning.  Has not had echo done yet.    Objective: Temp:  [98 F (36.7 C)-98.4 F (36.9 C)] 98 F (36.7 C) (03/27 0428) Pulse Rate:  [67-70] 70 (03/26 2204) Resp:  [18] 18 (03/27 0428) BP: (114-141)/(73-92) 141/92 (03/27 0840) SpO2:  [99 %-100 %] 100 % (03/26 2204) Weight:  [147 lb 8 oz (66.9 kg)] 147 lb 8 oz (66.9 kg) (03/27 0428)   Physical Exam: General: 56 yo F laying in hospital bed, in  NAD , appears comfortable  Eyes: PERRL, EOMI ENTM: NCAT, MMM, o/p clear  Neck: supple Cardiovascular: RRR, no MRG, palpable pulses Respiratory: CTA B/L, no wheezes noted  Gastrointestinal: soft, NT, ND, no masses, +bs MSK: no edema or tenderness noted Derm: skin is warm and dry Neuro: AAOx3, no focal deficits, strength 5/5 in bilateral upper and lower extremities  Psych: normal affect  Laboratory:  Recent Labs Lab 08/29/16 2301 08/31/16 0529  WBC 6.5 4.2  HGB 14.3 12.3  HCT 39.6 35.3*  PLT 267 222    Recent Labs Lab 08/29/16 2301 08/30/16 0625 08/31/16 0529  NA 137 137 138  K 3.8 4.1 4.1  CL 101 105 108  CO2 28 25 21*  BUN 12 13 15   CREATININE 1.12* 1.15* 1.18*  CALCIUM 9.2 8.1* 8.3*  PROT  --  5.8*  --   BILITOT  --  0.5  --   ALKPHOS  --  109  --   ALT  --  22  --   AST  --  25  --   GLUCOSE 106* 96 94   Imaging/Diagnostic Tests: No results found.  Lovenia Kim, MD 08/31/2016, 12:18 PM PGY-1, [place] Intern pager: [phone], text pages welcome

## 2016-08-31 NOTE — Discharge Instructions (Signed)
Follow up with Cardiology

## 2016-08-31 NOTE — Progress Notes (Signed)
  Echocardiogram 2D Echocardiogram has been performed.  Johny Chess 08/31/2016, 4:27 PM

## 2016-08-31 NOTE — Progress Notes (Signed)
Went to visit Mrs. [name] Rowe in the hospital. She was admitted with chest pain and dizziness. ACS was ruled out by serial troponin and EKG. Cardiology was involved. They think her symptoms are due to vasospasm of her coronary arteries. Her Echo just resulted without significant abnormality except for G1DD. She says her symptoms have gone. She is ready to go home and follow up in clinic. She is very appreciative about the excellent care she received from the inpatient team and all involved in her care.

## 2016-08-31 NOTE — Progress Notes (Signed)
D/c instructions discussed with pt and pt verbalized understanding. Pt d/c home.

## 2016-08-31 NOTE — Discharge Summary (Signed)
[facility] Discharge Summary  Patient name: [name] Rowe Medical record number: [id number] Date of birth: 20-Dec-1960 Age: 56 y.o. Gender: female Date of Admission: 08/29/2016  Date of Discharge: 08/31/2016 Admitting Physician: Leeanne Rio, MD  Primary Care Provider: [name], MD Consultants: Cardiology  Indication for Hospitalization: Chest pain   Discharge Diagnoses/Problem List:  Chest pain CAD Hypotension HTN Renal artery stenosis Fibromuscular dysplasia Migraine headache Hypothyroidism History of CVA Prolonged QTc HLD Fibromyalgia Constipation  Disposition: Home  Discharge Condition: Stable, improved  Discharge Exam:  General: 56 yo F laying in hospital bed, in NAD , appears comfortable  Eyes: PERRL, EOMI ENTM: NCAT, MMM, o/p clear  Neck: supple Cardiovascular: RRR, no MRG, palpable pulses Respiratory: CTA B/L, no wheezes noted  Gastrointestinal: soft, NT, ND, no masses, +bs MSK: no edema or tenderness noted Derm: skin is warm and dry Neuro: AAOx3, no focal deficits, strength 5/5 in bilateral upper and lower extremities  Psych: normal affect  Brief Hospital Course:  Patient presented with chest pain that felt similar to her previous episodes.  Occurred when at rest, substernal and radiating to the back.  Prior to coming in, she took SL nitro x2 with no relief of symptoms so was not given further nitro in the ED.   Was initially hypertensive to 171/116 and subsequently became hypotensive to 90/60.  She noted her chest pain was ongoing and currently 5/10 compared to 10/10 when it started.  Cardiology was consulted in the ED and she was admitted to stepdown in setting of ongoing chest pain.  She was given Aspirin in the ED and NS bolus which improved her blood pressure.  EKG with NSR and initial troponin 0.  Given her sudden hypotension and chest pain radiating to the back, concern for aortic dissection.  CT C/A/P was obtained  and dissection ruled out.  Most recent echo in 2013 with EF 55-60% and patient with history of vasospasm noted on last cath in 2016.  Troponins were trended and remained negative.  An EKG in the AM was unchanged.  Per cardiology, symptoms likely related to coronary vasospasm and would not treat for ACS unless objective findings of EKG changes or elevated troponins.  Patient's chest pain improved over the next day and her Labetalol was added back as blood pressures steadily increased.  Her home dose of  Labetalol was 200 mg.  Was switched to Coreg during this admission.  Additionally, per cardiology recommendations, Diltiazem was increased from 240 mg to 360 mg once BP tolerating. Ok to discharge home if echo normal.  An echo was obtained and revealed 60-65% EF with G1DD.  She was sent home in stable condition with cardiology follow up outpatient.    Issues for Follow Up:  1. Home Labetalol was discontinued and patient started on Coreg 3.125 mg BID.  Per cardiology recommendations, Diltiazem increased from 240 to 360 mg at time of discharge. 2. Consider adding long-acting nitrate vs Fluvastatin for recurrent chest pain/coronary vasospasm. 3. Please make sure she is taking Crestor 40 mg.  Per lipid panel on this hospitalization,  total cholesterol 268, LDL 182 and TG 202.  Patient on Crestor 40 mg daily at home but she reported the med as "not taking". 4. Patient to continue to follow up with cardiology outpatient.   Significant Procedures: Echo  Significant Labs and Imaging:   Recent Labs Lab 08/29/16 2301 08/31/16 0529  WBC 6.5 4.2  HGB 14.3 12.3  HCT 39.6 35.3*  PLT  267 222    Recent Labs Lab 08/29/16 2301 08/30/16 0625 08/31/16 0529  NA 137 137 138  K 3.8 4.1 4.1  CL 101 105 108  CO2 28 25 21*  GLUCOSE 106* 96 94  BUN 12 13 15   CREATININE 1.12* 1.15* 1.18*  CALCIUM 9.2 8.1* 8.3*  ALKPHOS  --  109  --   AST  --  25  --   ALT  --  22  --   ALBUMIN  --  3.5  --    Results/Tests  Pending at Time of Discharge: None  Discharge Medications:  Allergies as of 08/31/2016      Reactions   Hydromorphone Hcl Other (See Comments)   Palpitations   Prochlorperazine Edisylate Other (See Comments)   Causes Seizures   Sumatriptan Palpitations   Atorvastatin Other (See Comments)   Joint pain   Tramadol Anxiety      Medication List    STOP taking these medications   baclofen 10 MG tablet Commonly known as:  LIORESAL   labetalol 200 MG tablet Commonly known as:  NORMODYNE   triamcinolone cream 0.1 % Commonly known as:  KENALOG     TAKE these medications   aspirin EC 81 MG tablet Take 81 mg by mouth daily.   carvedilol 3.125 MG tablet Commonly known as:  COREG Take 1 tablet (3.125 mg total) by mouth 2 (two) times daily with a meal. Start taking on:  09/01/2016   diltiazem 180 MG 24 hr capsule Commonly known as:  DILACOR XR Take 2 capsules (360 mg total) by mouth daily. What changed:  medication strength  how much to take   DULoxetine 60 MG capsule Commonly known as:  CYMBALTA Take 1 capsule (60 mg total) by mouth daily.   levothyroxine 125 MCG tablet Commonly known as:  SYNTHROID, LEVOTHROID Take 1 tablet (125 mcg total) by mouth daily.   linaclotide 290 MCG Caps capsule Commonly known as:  LINZESS Take 1 capsule (290 mcg total) by mouth daily.   naproxen 500 MG tablet Commonly known as:  NAPROSYN Take 1 tablet (500 mg total) by mouth 2 (two) times daily with a meal.   nitroGLYCERIN 0.4 MG SL tablet Commonly known as:  NITROSTAT Place 1 tablet (0.4 mg total) under the tongue every 5 (five) minutes as needed. x3 doses as needed for chest pain   QUEtiapine 100 MG tablet Commonly known as:  SEROQUEL Take 100 mg by mouth at bedtime.   rosuvastatin 40 MG tablet Commonly known as:  CRESTOR Take 1 tablet (40 mg total) by mouth daily.   traZODone 100 MG tablet Commonly known as:  DESYREL Take 100 mg by mouth at bedtime.      Discharge  Instructions: Please refer to Patient Instructions section of EMR for full details.  Patient was counseled important signs and symptoms that should prompt return to medical care, changes in medications, dietary instructions, activity restrictions, and follow up appointments.   Follow-Up Appointments: Follow-up Information    [place]. Go on 09/10/2016.   Why:  For hospital follow up at 2pm with Dr. Zerita Boers information: [facility] [place]         Lovenia Kim, MD 08/31/2016, 9:23 PM PGY-1, [place]

## 2016-09-10 ENCOUNTER — Encounter: Payer: Self-pay | Admitting: Family Medicine

## 2016-09-10 ENCOUNTER — Ambulatory Visit (INDEPENDENT_AMBULATORY_CARE_PROVIDER_SITE_OTHER): Payer: Medicaid Other | Admitting: Family Medicine

## 2016-09-10 DIAGNOSIS — I10 Essential (primary) hypertension: Secondary | ICD-10-CM

## 2016-09-10 DIAGNOSIS — E038 Other specified hypothyroidism: Secondary | ICD-10-CM | POA: Diagnosis not present

## 2016-09-10 DIAGNOSIS — I201 Angina pectoris with documented spasm: Secondary | ICD-10-CM

## 2016-09-10 DIAGNOSIS — M542 Cervicalgia: Secondary | ICD-10-CM | POA: Diagnosis not present

## 2016-09-10 MED ORDER — ROSUVASTATIN CALCIUM 40 MG PO TABS: 40.0000 mg | ORAL_TABLET | Freq: Every day | ORAL | 0 refills | Status: DC

## 2016-09-10 MED ORDER — LEVOTHYROXINE SODIUM 125 MCG PO TABS: 125.0000 ug | ORAL_TABLET | Freq: Every day | ORAL | 3 refills | Status: DC

## 2016-09-10 MED ORDER — DILTIAZEM HCL ER 180 MG PO CP24: 360.0000 mg | ORAL_CAPSULE | Freq: Every day | ORAL | 0 refills | Status: DC

## 2016-09-10 MED ORDER — CARVEDILOL 3.125 MG PO TABS: 6.2500 mg | ORAL_TABLET | Freq: Two times a day (BID) | ORAL | 1 refills | Status: DC

## 2016-09-10 NOTE — Assessment & Plan Note (Signed)
Refilled levothyroxin today, recommended that the patient be seen by PCP in 3 months to recheck TSH at that time and titrate dose.

## 2016-09-10 NOTE — Assessment & Plan Note (Signed)
Initial BP elevated to 160/100 as patient walked in. Repeat 130s/80s x 2. Given pulse, will increase coreg to 6.25mg  BID. Refilled both coreg and dilt.

## 2016-09-10 NOTE — Patient Instructions (Signed)
It was a pleasure to see you today! Thank you for choosing [facility] for your primary care. [name] Rowe was seen for hospital follow up. Please go to your cardiology appointment on May 2 at 1:30. For your neck stiffness, try over-the-counter cream such as icy hot as well as a heating pad and stretching exercises. For your blood pressure, I have increased your Coreg and we will stay with the same dose of the diltiazem. I have also refilled her cholesterol and thyroid medications. Please come back to see your primary care doctor in 3 months.  Best,  Dr. Lindell Noe   Neck Exercises Neck exercises can be important for many reasons:  They can help you to improve and maintain flexibility in your neck. This can be especially important as you age.  They can help to make your neck stronger. This can make movement easier.  They can reduce or prevent neck pain.  They may help your upper back. Ask your health care provider which neck exercises would be best for you. Exercises Neck Press  Repeat this exercise 10 times. Do it first thing in the morning and right before bed or as told by your health care provider. 1. Lie on your back on a firm bed or on the floor with a pillow under your head. 2. Use your neck muscles to push your head down on the pillow and straighten your spine. 3. Hold the position as well as you can. Keep your head facing up and your chin tucked. 4. Slowly count to 5 while holding this position. 5. Relax for a few seconds. Then repeat. Isometric Strengthening  Do a full set of these exercises 2 times a day or as told by your health care provider. 1. Sit in a supportive chair and place your hand on your forehead. 2. Push forward with your head and neck while pushing back with your hand. Hold for 10 seconds. 3. Relax. Then repeat the exercise 3 times. 4. Next, do thesequence again, this time putting your hand against the back of your head. Use your head and neck to  push backward against the hand pressure. 5. Finally, do the same exercise on either side of your head, pushing sideways against the pressure of your hand. Prone Head Lifts  Repeat this exercise 5 times. Do this 2 times a day or as told by your health care provider. 1. Lie face-down, resting on your elbows so that your chest and upper back are raised. 2. Start with your head facing downward, near your chest. Position your chin either on or near your chest. 3. Slowly lift your head upward. Lift until you are looking straight ahead. Then continue lifting your head as far back as you can stretch. 4. Hold your head up for 5 seconds. Then slowly lower it to your starting position. Supine Head Lifts  Repeat this exercise 8-10 times. Do this 2 times a day or as told by your health care provider. 1. Lie on your back, bending your knees to point to the ceiling and keeping your feet flat on the floor. 2. Lift your head slowly off the floor, raising your chin toward your chest. 3. Hold for 5 seconds. 4. Relax and repeat. Scapular Retraction  Repeat this exercise 5 times. Do this 2 times a day or as told by your health care provider. 1. Stand with your arms at your sides. Look straight ahead. 2. Slowly pull both shoulders backward and downward until you feel a stretch  between your shoulder blades in your upper back. 3. Hold for 10-30 seconds. 4. Relax and repeat. Contact a health care provider if:  Your neck pain or discomfort gets much worse when you do an exercise.  Your neck pain or discomfort does not improve within 2 hours after you exercise. If you have any of these problems, stop exercising right away. Do not do the exercises again unless your health care provider says that you can. Get help right away if:  You develop sudden, severe neck pain. If this happens, stop exercising right away. Do not do the exercises again unless your health care provider says that you can. Exercises Neck  Stretch  Repeat this exercise 3-5 times. 1. Do this exercise while standing or while sitting in a chair. 2. Place your feet flat on the floor, shoulder-width apart. 3. Slowly turn your head to the right. Turn it all the way to the right so you can look over your right shoulder. Do not tilt or tip your head. 4. Hold this position for 10-30 seconds. 5. Slowly turn your head to the left, to look over your left shoulder. 6. Hold this position for 10-30 seconds. Neck Retraction Repeat this exercise 8-10 times. Do this 3-4 times a day or as told by your health care provider. 1. Do this exercise while standing or while sitting in a sturdy chair. 2. Look straight ahead. Do not bend your neck. 3. Use your fingers to push your chin backward. Do not bend your neck for this movement. Continue to face straight ahead. If you are doing the exercise properly, you will feel a slight sensation in your throat and a stretch at the back of your neck. 4. Hold the stretch for 1-2 seconds. Relax and repeat. This information is not intended to replace advice given to you by your health care provider. Make sure you discuss any questions you have with your health care provider. Document Released: 05/05/2015 Document Revised: 10/30/2015 Document Reviewed: 12/02/2014 Elsevier Interactive Patient Education  2017 [employer].

## 2016-09-10 NOTE — Progress Notes (Signed)
CC: hosp follow up, neck pain   HPI New and different neck pain - started Monday. Starts in back of neck and goes down between her shoulder blades. No change in physical activity. No inciting injury. More pain with lying flat, no pain at all when lying on sides. Woke up with it. No particular increased stress. Has tried flexeril 10mg  x 2 with no change, baclofen 2x and no change. No numbness or tingling. No vision changes. No new weakness in either arm.   Meds taken today - levothy, ASA, coreg, 1 pill of dilt. Normally takes coreg, dilt, seroquel, and trazodone at night.   Chest pain squeezing for a few seconds, feels better after breathing. Happened maybe every other day since discharge. Lasts only 2-3 seconds. No change with activity, last episode noted while seated talking to granddaughters on facetime.   CC, SH/smoking status, and VS noted  Objective: BP (!) 138/92   Pulse (!) 106   Temp 98.3 F (36.8 C) (Oral)   Wt 146 lb 6.4 oz (66.4 kg)   SpO2 100%   BMI 25.13 kg/m  Gen: NAD, alert, cooperative, and pleasant. HEENT: NCAT, EOMI, PERRL CV: RRR, no murmur Resp: CTAB, no wheezes, non-labored Abd: SNTND, BS present, no guarding or organomegaly Ext: No edema, warm Neuro: Alert and oriented, Speech clear, No gross deficits  Assessment and plan:  Essential hypertension Initial BP elevated to 160/100 as patient walked in. Repeat 130s/80s x 2. Given pulse, will increase coreg to 6.25mg  BID. Refilled both coreg and dilt.   Coronary vasospasm Patient did not have cardiology follow up. She is going to [place] for the next month to visit family, and  we have scheduled her a cardiology appointment after this. In case she has further episodes, I printed her EKG, last echo, last cardiology note should she need medical attention while in [place]. Brief, self resolved few second episodes of chest pain sounds like possible PVCs. Do not feel these new acute workup  today.  Hypothyroidism Refilled levothyroxin today, recommended that the patient be seen by PCP in 3 months to recheck TSH at that time and titrate dose.  Neck pain on left side Today the patient notes bilateral paraspinal pain in the neck radiating up to her occiput. Tender on exam over trapezius bilaterally as well as thoracic paraspinal muscles. Tried muscle relaxers at home with no change, as such will not refill these. Recommended ice, heat, exercises. Additionally gave instructions about massage with a tenderness or a lacrosse ball while lying on the floor. No red flag symptoms.   No orders of the defined types were placed in this encounter.   Meds ordered this encounter  Medications  . carvedilol (COREG) 3.125 MG tablet    Sig: Take 2 tablets (6.25 mg total) by mouth 2 (two) times daily with a meal.    Dispense:  120 tablet    Refill:  1  . diltiazem (DILACOR XR) 180 MG 24 hr capsule    Sig: Take 2 capsules (360 mg total) by mouth daily.    Dispense:  60 capsule    Refill:  0  . rosuvastatin (CRESTOR) 40 MG tablet    Sig: Take 1 tablet (40 mg total) by mouth daily.    Dispense:  30 tablet    Refill:  0  . levothyroxine (SYNTHROID, LEVOTHROID) 125 MCG tablet    Sig: Take 1 tablet (125 mcg total) by mouth daily.    Dispense:  90 tablet  Refill:  3     Ralene Ok, MD, PGY1 09/10/2016 2:48 PM

## 2016-09-10 NOTE — Assessment & Plan Note (Signed)
Today the patient notes bilateral paraspinal pain in the neck radiating up to her occiput. Tender on exam over trapezius bilaterally as well as thoracic paraspinal muscles. Tried muscle relaxers at home with no change, as such will not refill these. Recommended ice, heat, exercises. Additionally gave instructions about massage with a tenderness or a lacrosse ball while lying on the floor. No red flag symptoms.

## 2016-09-10 NOTE — Assessment & Plan Note (Addendum)
Patient did not have cardiology follow up. She is going to [place] for the next month to visit family, and  we have scheduled her a cardiology appointment after this. In case she has further episodes, I printed her EKG, last echo, last cardiology note should she need medical attention while in [place]. Brief, self resolved few second episodes of chest pain sounds like possible PVCs. Do not feel these new acute workup today.

## 2016-10-06 ENCOUNTER — Ambulatory Visit: Payer: Medicaid Other | Admitting: Cardiology

## 2016-10-20 ENCOUNTER — Encounter: Payer: Self-pay | Admitting: Cardiology

## 2016-10-20 ENCOUNTER — Ambulatory Visit (INDEPENDENT_AMBULATORY_CARE_PROVIDER_SITE_OTHER): Payer: Medicaid Other | Admitting: Cardiology

## 2016-10-20 VITALS — BP 108/80 | HR 76 | Ht 64.0 in | Wt 144.0 lb

## 2016-10-20 DIAGNOSIS — M797 Fibromyalgia: Secondary | ICD-10-CM

## 2016-10-20 DIAGNOSIS — Z72821 Inadequate sleep hygiene: Secondary | ICD-10-CM | POA: Diagnosis not present

## 2016-10-20 DIAGNOSIS — Z8673 Personal history of transient ischemic attack (TIA), and cerebral infarction without residual deficits: Secondary | ICD-10-CM

## 2016-10-20 DIAGNOSIS — E785 Hyperlipidemia, unspecified: Secondary | ICD-10-CM

## 2016-10-20 DIAGNOSIS — R0600 Dyspnea, unspecified: Secondary | ICD-10-CM | POA: Diagnosis not present

## 2016-10-20 DIAGNOSIS — I773 Arterial fibromuscular dysplasia: Secondary | ICD-10-CM | POA: Diagnosis not present

## 2016-10-20 DIAGNOSIS — G478 Other sleep disorders: Secondary | ICD-10-CM

## 2016-10-20 DIAGNOSIS — I10 Essential (primary) hypertension: Secondary | ICD-10-CM | POA: Diagnosis not present

## 2016-10-20 NOTE — Progress Notes (Signed)
[birth year] [name] Rowe   [birth year]  [id number]  Primary Physician [name], MD Primary Cardiologist: Dr Oval Linsey  HPI:  56 y/o F with hx of HTN, CVA 2006, fibromuscular dysplasia/RAS s/p RA stent 2009,  cath in 2009 in [place] showing no angiographic CAD, but evidence of catheter induced RCA spasm. She has had chronic, intermittent chest. that has been felt due to possibly be due to coronary vasospasm vs microsvascular obstruction. She had a negative nuc in Nov 2015. She was in the hospital in March 2018 with chest pain that was felt to be atypical. Her Troponin were negative and her echo showed normal LVF.   She is in the office today with complaints of waking frequently (one or two times a week) gasping for breath. These episodes are worse when she lays on her back. She doesn't know for sure if she snore as she lives alone. She does say she has poor sleep- waking several time a night.    Current Outpatient Prescriptions  Medication Sig Dispense Refill  . aspirin EC 81 MG tablet Take 81 mg by mouth daily.    . carvedilol (COREG) 3.125 MG tablet Take 2 tablets (6.25 mg total) by mouth 2 (two) times daily with a meal. 120 tablet 1  . diltiazem (DILACOR XR) 180 MG 24 hr capsule Take 2 capsules (360 mg total) by mouth daily. 60 capsule 0  . DULoxetine (CYMBALTA) 60 MG capsule Take 1 capsule (60 mg total) by mouth daily. 30 capsule 2  . levothyroxine (SYNTHROID, LEVOTHROID) 125 MCG tablet Take 1 tablet (125 mcg total) by mouth daily. 90 tablet 3  . linaclotide (LINZESS) 290 MCG CAPS capsule Take 1 capsule (290 mcg total) by mouth daily. 30 capsule 11  . naproxen (NAPROSYN) 500 MG tablet Take 1 tablet (500 mg total) by mouth 2 (two) times daily with a meal. 30 tablet 0  . nitroGLYCERIN (NITROSTAT) 0.4 MG SL tablet Place 1 tablet (0.4 mg total) under the tongue every 5 (five) minutes as needed. x3 doses as needed for chest pain 100 tablet 3  . QUEtiapine (SEROQUEL) 100 MG tablet Take  100 mg by mouth at bedtime.    . rosuvastatin (CRESTOR) 40 MG tablet Take 1 tablet (40 mg total) by mouth daily. 30 tablet 0  . traZODone (DESYREL) 100 MG tablet Take 100 mg by mouth at bedtime.     No current facility-administered medications for this visit.     Allergies  Allergen Reactions  . Hydromorphone Hcl Other (See Comments)    Palpitations  . Prochlorperazine Edisylate Other (See Comments)    Causes Seizures   . Sumatriptan Palpitations  . Atorvastatin Other (See Comments)    Joint pain  . Tramadol Anxiety    Past Medical History:  Diagnosis Date  . Adenomyosis   . Anginal pain ([place])   . Anxiety 2001  . Chest pain    a. No angiographic CAD by cath 2009 in [place]. There was catheter-induced RCA vasospasm versus microvascular obstruction. bCarlton Adam myoview 04/2010 - EF 79%, normal perfusion.;  c. ETT-Myoview 7/13: no ischemia, EF 77%, submax exercise  . Chronic back pain    had 3 epidural injections on 02-14-13  . Complication of anesthesia    has difficulty voiding after surgery  . Coronary vasospasm ([place])   . CVA (cerebral vascular accident) ([place]) 2006   lost hearing on left  . Depression 2001  . Dysrhythmia   . Fibromuscular dysplasia ([place]) 2009  a. Renal artery stenosis due to this. b. Carotid dopplers 07/2011 c/w FMD but no significant stenosis  . Fibromyalgia 1999  . H/O Graves' disease 2003   s/p radio-iodine treatment in 2003   . Hearing loss of left ear    s/p CVA 2006  . History of stent insertion of renal artery 2009   right renal stent  . HLD (hyperlipidemia) 2000  . HTN (hypertension) 1988   a. Likely related to RAS; urinary metanephrines and plasma renin/aldosterone ratio normal. b. H/o HTN urgency 06/2011 after being off BP meds for a number of days  . Hx of echocardiogram    a. Echo (1/13): EF 18-29%, grade 1 diastolic dysfunction  . Hypertensive crisis 06/29/2011  . Hypothyroidism 2003   Graves disease s/p radio-iodine treatment in 2003    . Irritable bowel syndrome   . Leiomyoma   . Migraine headache   . Myocardial infarction ([place]) 2009  . Personal history of colonic polyps-sessile serrated adenoma 02/23/2013  . Renal artery stenosis ([place])    a. Due to fibromuscular dysplasia. b. Renal stent 2009. c. Studies:  CTA abdomen (1/13) with evidence for fibromuscular dysplasia, right renal artery stent appears patent. Renal artery dopplers (2/13) with FMD but no evidence for signficant stenosis.   . Tachycardia 2000   10/27/10-Holter -NSR with occ sinus tachy-rare PACs, no sig arrhythmia    Social History   Social History  . Marital status: Divorced    Spouse name: N/A  . Number of children: 2  . Years of education: N/A   Occupational History  . worked in a Development worker, [employer]. Unemployed    Quit about a yr ago because fo work related injury to left knee   Social History Main Topics  . Smoking status: Never Smoker  . Smokeless tobacco: Never Used  . Alcohol use Yes     Comment: rarely  . Drug use: No  . Sexual activity: Not on file   Other Topics Concern  . Not on file   Social History Narrative   Second Husband is currently in jail for an offense committed in [place] and he is awaiting extradition.   Lives alone.    Jehovah's witness.    Walks 20-30 minutes.                  Family History  Problem Relation Age of Onset  . Hypertension Mother   . Asthma Mother   . Hyperlipidemia Mother   . Stroke Mother   . Hypertension Father   . Prostate cancer Father   . Heart attack Father   . Hyperlipidemia Father   . Stroke Father   . Heart attack Unknown   . Stroke Unknown   . Hypertension Brother   . Heart attack Brother 66  . Early death Brother 42       massive stroke   . Hyperlipidemia Brother   . Metabolic syndrome Sister   . Depression Sister   . Hypertension Sister   . Diabetes Maternal Aunt   . Colon cancer Neg Hx   . Stomach cancer Neg Hx      Review of Systems: General: negative  for chills, fever, night sweats or weight changes.  Cardiovascular: negative for chest pain, dyspnea on exertion, edema, orthopnea, palpitations, paroxysmal nocturnal dyspnea or shortness of breath Dermatological: negative for rash Respiratory: negative for cough or wheezing Urologic: negative for hematuria Abdominal: negative for nausea, vomiting, diarrhea, bright red blood per rectum, melena, or hematemesis  Neurologic: negative for visual changes, syncope, or dizziness All other systems reviewed and are otherwise negative except as noted above.    Blood pressure 108/80, pulse 76, height 5\' 4"  (1.626 m), weight 144 lb (65.3 kg).  General appearance: alert, cooperative and no distress Neck: no carotid bruit and no JVD Lungs: clear to auscultation bilaterally Heart: regular rate and rhythm Extremities: extremities normal, atraumatic, no cyanosis or edema Skin: Skin color, texture, turgor normal. No rashes or lesions Neurologic: Grossly normal  EKG NSR  ASSESSMENT AND PLAN:   History of coronary vasospasm CAD with 40-50% stenosis in the proximal LAD and documented vasospasm Aug 2016  Poorly-controlled hypertension Good control today on her current medications  History of CVA (cerebrovascular accident) Remote lacunar CVA seen in past MRI  Fibromuscular dysplasia ([facility]) History of renal artery stenting in 2009  Fibromyalgia Chronic back pain, prior surgery  Dyslipidemia LDL 187 March 2018- Crestor added   Poor sleep pattern Pt says she frequently wakes up gasping. She says it worse when she lays on her back. She tells me she only sleeps 20 minutes at a time.   PLAN  I offered a sleep study and she is agreeable, two of her extended family have sleep apnea and she often wondered if that was a problem for her as well. She lives alone so snoring could not be positively documented but she does describe frequent waking and gasping when flat on her back, and she has a history of  HTN.   Kerin Ransom PA-C 10/20/2016 10:41 AM

## 2016-10-20 NOTE — Assessment & Plan Note (Signed)
LDL 187 March 2018- Crestor added

## 2016-10-20 NOTE — Assessment & Plan Note (Signed)
Chronic back pain, prior surgery

## 2016-10-20 NOTE — Patient Instructions (Signed)
Medication Instructions:  No change   Labwork:   none  Testing/Procedures:  Your physician has recommended that you have a sleep study. This test records several body functions during sleep, including: brain activity, eye movement, oxygen and carbon dioxide blood levels, heart rate and rhythm, breathing rate and rhythm, the flow of air through your mouth and nose, snoring, body muscle movements, and chest and belly movement.   Follow-Up:  w/ Dr. Oval Linsey in 6 months.   If you need a refill on your cardiac medications before your next appointment, please call your pharmacy.

## 2016-10-20 NOTE — Assessment & Plan Note (Signed)
Remote lacunar CVA seen in past MRI

## 2016-10-20 NOTE — Assessment & Plan Note (Signed)
Good control today on her current medications

## 2016-10-20 NOTE — Assessment & Plan Note (Signed)
Pt says she frequently wakes up gasping. She says it worse when she lays on her back. She tells me she only sleeps 20 minutes at a time.

## 2016-10-20 NOTE — Assessment & Plan Note (Signed)
History of renal artery stenting in 2009 

## 2016-10-20 NOTE — Assessment & Plan Note (Signed)
CAD with 40-50% stenosis in the proximal LAD and documented vasospasm Aug 2016

## 2016-11-19 ENCOUNTER — Other Ambulatory Visit: Payer: Self-pay | Admitting: *Deleted

## 2016-11-19 MED ORDER — LINACLOTIDE 290 MCG PO CAPS: 290.0000 ug | ORAL_CAPSULE | Freq: Every day | ORAL | 11 refills | Status: DC

## 2016-11-23 ENCOUNTER — Encounter (HOSPITAL_BASED_OUTPATIENT_CLINIC_OR_DEPARTMENT_OTHER): Payer: Medicaid Other

## 2016-12-07 ENCOUNTER — Encounter: Payer: Self-pay | Admitting: Student in an Organized Health Care Education/Training Program

## 2016-12-07 ENCOUNTER — Ambulatory Visit (HOSPITAL_COMMUNITY)
Admission: RE | Admit: 2016-12-07 | Discharge: 2016-12-07 | Disposition: A | Discharge status: Home / Self Care | Payer: Medicaid Other | Source: Ambulatory Visit | Attending: Family Medicine | Admitting: Family Medicine

## 2016-12-07 ENCOUNTER — Ambulatory Visit (INDEPENDENT_AMBULATORY_CARE_PROVIDER_SITE_OTHER): Payer: Medicaid Other | Admitting: Student in an Organized Health Care Education/Training Program

## 2016-12-07 VITALS — Temp 98.5°F | Wt 145.0 lb

## 2016-12-07 DIAGNOSIS — R9431 Abnormal electrocardiogram [ECG] [EKG]: Secondary | ICD-10-CM | POA: Diagnosis not present

## 2016-12-07 DIAGNOSIS — R42 Dizziness and giddiness: Secondary | ICD-10-CM

## 2016-12-07 DIAGNOSIS — R002 Palpitations: Secondary | ICD-10-CM | POA: Diagnosis not present

## 2016-12-07 LAB — POCT HEMOGLOBIN: Hemoglobin: 15.1 g/dL (ref 12.2–16.2)

## 2016-12-07 NOTE — Assessment & Plan Note (Addendum)
Consider thyroid dysfunction (history of hypothyroidism with change in levothyroxine dose), hypoglycemia (had just eaten, no hx diabetes), cardiac etiology considered given palpitations. Less likely vertigo (no sensation of spinning), less likely PE (vitals normal and no risk factors), pregnancy considered but s/p hysterectomy and 56 years old. Anemia considered, POC Hgb normal at 15.5 - EKG this visit WNL except for LVH (reviewed w Dr. Gwendlyn Deutscher) - ordered TSH, fT4 >>will follow up with these results - return precautions provided - cardiology consult placed for holter monitor - orthostatic hypotension likely contributing, however may not explain entire presentation given that patient had been seated in car for several minutes prior to onset of symptoms

## 2016-12-07 NOTE — Progress Notes (Signed)
   CC: Dizziness/palpitations, episodic  HPI: [name] Rowe is a 56 y.o. female with PMH significant for dizziness, migraines, coronary vasospasm, anxiety, depression, HTN, dyslipidemia, hx CVA  who presents to [facility] today with episodic dizziness of 6 months duration.   Episodic Dizziness with palpitations Patient reports she was driving her car today when she noted that her heart was racing. She felt light headed and felt she was seeing double. No sensation that the room was spinning. She pulled over her car and on the side of the road the episode resolved on its own after about one minute.   - She had eaten breakfast shortly prior to the episode.  - She has a history of hypothyroidism and notes titration of levothyroxine to 125 mcg from 150 mcg over last several months.  - She is prescribed 6.25 mg coreg BID but only takes 3.125 daily because she feels this helps with headaches.  - Previous episodes, this has happened for months (remembers episode in December).  Episodes happen once-twice per week - episodes always last 1-1.5 minute - Denies any episodes where she lost consciousness - coroanary vasospasm but denies other cardiac history  Review of Symptoms:  See HPI for ROS.   CC, SH/smoking status, and VS noted.  Objective: Temp 98.5 F (36.9 C) (Axillary)   Wt 145 lb (65.8 kg)   SpO2 98%   BMI 24.89 kg/m  GEN: NAD, alert, cooperative, and pleasant, nontoxic appearing NECK: full ROM, no thyromegaly RESPIRATORY: clear to auscultation bilaterally with no wheezes, rhonchi or rales, good effort CV: RRR, no m/r/g, no peripheral edema SKIN: warm and dry, no rashes or lesions NEURO: II-XII grossly intact, normal gait, peripheral sensation intact PSYCH: AAOx3, appropriate affect  POC Hgb 15.5 EKG - NSR, LVH  Assessment and plan:  Dizziness and palpitations, episodic Consider thyroid dysfunction (history of hypothyroidism with change in levothyroxine dose), hypoglycemia (had just  eaten, no hx diabetes), cardiac etiology considered given palpitations. Less likely vertigo (no sensation of spinning), less likely PE (vitals normal and no risk factors), pregnancy considered but s/p hysterectomy and 56 years old. Anemia considered, POC Hgb normal at 15.5 - EKG this visit WNL except for LVH (reviewed w Dr. Gwendlyn Deutscher) - ordered TSH, fT4 >>will follow up with these results - return precautions provided - cardiology consult placed for holter monitor - orthostatic hypotension likely contributing, however may not explain entire presentation given that patient had been seated in car for several minutes prior to onset of symptoms   Orders Placed This Encounter  Procedures  . TSH  . T4+Free T4  . Ambulatory referral to Cardiology    Referral Priority:   Routine    Referral Type:   Consultation    Referral Reason:   Specialty Services Required    Requested Specialty:   Cardiology    Number of Visits Requested:   1  . Holter monitor - 72 hour    Standing Status:   Future    Standing Expiration Date:   12/08/2026  . POCT hemoglobin  . EKG 12-Lead    Everrett Coombe, MD,MS,  PGY2 12/07/2016 5:52 PM

## 2016-12-07 NOTE — Patient Instructions (Signed)
It was a pleasure seeing you today in our clinic. Today we discussed your dizziness and heart raciong. Here is the treatment plan we have discussed and agreed upon together:  A consult was placed to cardiology at today's visit.  You will receive a call to schedule an appointment. If you do not receive a call within two weeks please call our office so we can place the consult again.  I will call you with the results from your blood work.  Our clinic's number is [phone]. Please call with questions or concerns about what we discussed today.  Be well, Dr. Burr Medico

## 2016-12-08 LAB — TSH: TSH: 3.32 u[IU]/mL (ref 0.450–4.500)

## 2016-12-11 ENCOUNTER — Encounter: Payer: Self-pay | Admitting: Student in an Organized Health Care Education/Training Program

## 2016-12-14 ENCOUNTER — Encounter: Payer: Self-pay | Admitting: Cardiology

## 2016-12-14 ENCOUNTER — Ambulatory Visit (INDEPENDENT_AMBULATORY_CARE_PROVIDER_SITE_OTHER): Payer: Medicaid Other

## 2016-12-14 ENCOUNTER — Ambulatory Visit (INDEPENDENT_AMBULATORY_CARE_PROVIDER_SITE_OTHER): Payer: Medicaid Other | Admitting: Cardiology

## 2016-12-14 VITALS — BP 150/106 | HR 101 | Ht 64.0 in | Wt 145.8 lb

## 2016-12-14 DIAGNOSIS — I2510 Atherosclerotic heart disease of native coronary artery without angina pectoris: Secondary | ICD-10-CM | POA: Diagnosis not present

## 2016-12-14 DIAGNOSIS — I10 Essential (primary) hypertension: Secondary | ICD-10-CM | POA: Diagnosis not present

## 2016-12-14 DIAGNOSIS — R002 Palpitations: Secondary | ICD-10-CM | POA: Diagnosis not present

## 2016-12-14 DIAGNOSIS — E782 Mixed hyperlipidemia: Secondary | ICD-10-CM

## 2016-12-14 LAB — T4+FREE T4
Free T4 by Dialysis: 1.5 ng/dL
Thyroxine (T4): 10 ug/dL

## 2016-12-14 MED ORDER — CARVEDILOL 12.5 MG PO TABS: 12.5000 mg | ORAL_TABLET | Freq: Two times a day (BID) | ORAL | 3 refills | Status: DC

## 2016-12-14 NOTE — Progress Notes (Signed)
Electrophysiology Office Note   Date:  8/41/6606   ID:  [name] Rowe, DOB [birth year], MRN [id number]  PCP:  [name], MD  Cardiologist:  Oval Linsey Primary Electrophysiologist:  Haniyyah Sakuma Meredith Leeds, MD    Chief Complaint  Patient presents with  . Advice Only    Palpitations/dizziness     History of Present Illness: [name] Rowe is a 56 y.o. female who is being seen today for the evaluation of palpitations at the request of [name], MD. Presenting today for electrophysiology evaluation. She's been having episodic dizziness with palpitations. She was driving her car on 7/3 and noted that her heart was racing. She felt lightheaded and was seeing double. She had no sensation that the room was spinning. She pulled her car over on the side of the road and the episode resolved after a minute. These episodes happen once or twice per week. They've been happening since December. They last for one to one and a half minutes. She has not lost consciousness.She is having these symptoms almost daily.    Today, she denies symptoms of chest pain, shortness of breath, orthopnea, PND, lower extremity edema, claudication, dizziness, presyncope, syncope, bleeding, or neurologic sequela. The patient is tolerating medications without difficulties.    Past Medical History:  Diagnosis Date  . Adenomyosis   . Anginal pain ([place])   . Anxiety 2001  . Chest pain    a. No angiographic CAD by cath 2009 in [place]. There was catheter-induced RCA vasospasm versus microvascular obstruction. bCarlton Adam myoview 04/2010 - EF 79%, normal perfusion.;  c. ETT-Myoview 7/13: no ischemia, EF 77%, submax exercise  . Chronic back pain    had 3 epidural injections on 02-14-13  . Complication of anesthesia    has difficulty voiding after surgery  . Coronary vasospasm ([place])   . CVA (cerebral vascular accident) ([place]) 2006   lost hearing on left  . Depression 2001  . Dysrhythmia   . 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 Renal artery stenosis due to this. b. Carotid dopplers 07/2011 c/w FMD but no significant stenosis  . Fibromyalgia 1999  . H/O Graves' disease 2003   s/p radio-iodine treatment in 2003   . Hearing loss of left ear    s/p CVA 2006  . History of stent insertion of renal artery 2009   right renal stent  . HLD (hyperlipidemia) 2000  . HTN (hypertension) 1988   a. Likely related to RAS; urinary metanephrines and plasma renin/aldosterone ratio normal. b. H/o HTN urgency 06/2011 after being off BP meds for a number of days  . Hx of echocardiogram    a. Echo (1/13): EF 22-02%, grade 1 diastolic dysfunction  . Hypertensive crisis 06/29/2011  . Hypothyroidism 2003   Graves disease s/p radio-iodine treatment in 2003   . Irritable bowel syndrome   . Leiomyoma   . Migraine headache   . Myocardial infarction ([place]) 2009  . Personal history of colonic polyps-sessile serrated adenoma 02/23/2013  . Renal artery stenosis ([place])    a. Due to fibromuscular dysplasia. b. Renal stent 2009. c. Studies:  CTA abdomen (1/13) with evidence for fibromuscular dysplasia, right renal artery stent appears patent. Renal artery dopplers (2/13) with FMD but no evidence for signficant stenosis.   . Tachycardia 2000   10/27/10-Holter -NSR with occ sinus tachy-rare PACs, no sig arrhythmia   Past Surgical History:  Procedure Laterality Date  . BACK SURGERY  2010 and 2011   In CT.  11/19/2008 (possibly a laminectomy) and in May of 2011 she underwent a L2-L3 fusion.  . cardiac cath    . CARDIAC CATHETERIZATION  2009   states it was normal  . CARDIAC CATHETERIZATION N/A 02/03/2015   Procedure: Left Heart Cath and Coronary Angiography;  Surgeon: Larey Dresser, MD;  Location: [facility];  Service: Cardiovascular;  Laterality: N/A;  . COLONOSCOPY    . COLONOSCOPY    . HIP SURGERY Bilateral 1988 and 1989   "scraped head of femur"  . KNEE ARTHROSCOPY Left   . LUMBAR LAMINECTOMY/DECOMPRESSION  MICRODISCECTOMY Left 05/25/2013   Procedure: LUMBAR LAMINECTOMY/DECOMPRESSION MICRODISCECTOMY 1 LEVEL;  Surgeon: Winfield Cunas, MD;  Location: [facility];  Service: Neurosurgery;  Laterality: Left;  LEFT L5S1 microdiskectomy  . RENAL ARTERY STENT  2009  . ROBOTIC ASSISTED LAP VAGINAL HYSTERECTOMY  05/13/2008   [facility] hysterectomy  for myomatous uterus   . SPINE SURGERY    . VAGINAL HYSTERECTOMY       Current Outpatient Prescriptions  Medication Sig Dispense Refill  . aspirin EC 81 MG tablet Take 81 mg by mouth daily.    Marland Kitchen diltiazem (DILACOR XR) 180 MG 24 hr capsule Take 2 capsules (360 mg total) by mouth daily. 60 capsule 0  . DULoxetine (CYMBALTA) 60 MG capsule Take 1 capsule (60 mg total) by mouth daily. 30 capsule 2  . levothyroxine (SYNTHROID, LEVOTHROID) 125 MCG tablet Take 1 tablet (125 mcg total) by mouth daily. 90 tablet 3  . linaclotide (LINZESS) 290 MCG CAPS capsule Take 1 capsule (290 mcg total) by mouth daily. 30 capsule 11  . nitroGLYCERIN (NITROSTAT) 0.4 MG SL tablet Place 1 tablet (0.4 mg total) under the tongue every 5 (five) minutes as needed. x3 doses as needed for chest pain 100 tablet 3  . QUEtiapine (SEROQUEL) 100 MG tablet Take 100 mg by mouth at bedtime.    . rosuvastatin (CRESTOR) 40 MG tablet Take 1 tablet (40 mg total) by mouth daily. 30 tablet 0  . traZODone (DESYREL) 100 MG tablet Take 100 mg by mouth at bedtime.    . carvedilol (COREG) 12.5 MG tablet Take 1 tablet (12.5 mg total) by mouth 2 (two) times daily. 180 tablet 3   No current facility-administered medications for this visit.     Allergies:   Hydromorphone hcl; Prochlorperazine edisylate; Sumatriptan; Atorvastatin; and Tramadol   Social History:  The patient  reports that she has never smoked. She has never used smokeless tobacco. She reports that she drinks alcohol. She reports that she does not use drugs.   Family History:  The patient's family history includes Asthma in her mother;  Depression in her sister; Diabetes in her maternal aunt; Early death (age of onset: 1) in her brother; Heart attack in her father; Heart attack (age of onset: 40) in her brother; Hyperlipidemia in her brother, father, and mother; Hypertension in her brother, father, mother, and sister; Metabolic syndrome in her sister; Prostate cancer in her father; Stroke in her father and mother.    ROS:  Please see the history of present illness.   Otherwise, review of systems is positive for palpitations, visual changes, dizziness, headaches.   All other systems are reviewed and negative.    PHYSICAL EXAM: VS:  BP (!) 150/106   Pulse (!) 101   Ht 5\' 4"  (1.626 m)   Wt 145 lb 12.8 oz (66.1 kg)   SpO2 97%   BMI 25.03 kg/m  , BMI Body  mass index is 25.03 kg/m. GEN: Well nourished, well developed, in no acute distress  HEENT: normal  Neck: no JVD, carotid bruits, or masses Cardiac: RRR; no murmurs, rubs, or gallops,no edema  Respiratory:  clear to auscultation bilaterally, normal work of breathing GI: soft, nontender, nondistended, + BS MS: no deformity or atrophy  Skin: warm and dry Neuro:  Strength and sensation are intact Psych: euthymic mood, full affect  EKG:  EKG is not ordered today. Personal review of the ekg ordered 12/07/16 shows sinus tachycardia, rate 91  Recent Labs: 08/30/2016: ALT 22; B Natriuretic Peptide 14.9 08/31/2016: BUN 15; Creatinine, Ser 1.18; Platelets 222; Potassium 4.1; Sodium 138 12/07/2016: Hemoglobin 15.1; TSH 3.320    Lipid Panel     Component Value Date/Time   CHOL 268 (H) 08/30/2016 0858   TRIG 202 (H) 08/30/2016 0858   HDL 46 08/30/2016 0858   CHOLHDL 5.8 08/30/2016 0858   VLDL 40 08/30/2016 0858   LDLCALC 182 (H) 08/30/2016 0858   LDLDIRECT 147.4 06/12/2013 0943     Wt Readings from Last 3 Encounters:  12/14/16 145 lb 12.8 oz (66.1 kg)  12/07/16 145 lb (65.8 kg)  10/20/16 144 lb (65.3 kg)      Other studies Reviewed: Additional studies/ records that  were reviewed today include: TTE 08/31/16  Review of the above records today demonstrates:  - Left ventricle: The cavity size was normal. Wall thickness was   normal. Systolic function was normal. The estimated ejection   fraction was in the range of 60% to 65%. Wall motion was normal;   there were no regional wall motion abnormalities. Doppler   parameters are consistent with abnormal left ventricular   relaxation (grade 1 diastolic dysfunction). - Pericardium, extracardiac: A trivial pericardial effusion was   identified.  Cath 02/03/15 1. 40-50% proximal LAD stenosis after take-off of moderate D2.  By itself, I do not think this lesion should be causing her symptoms.  We gave her NTG IA and the lesion did not change.  Superimposed vasoconstriction in this area could certainly cause her symptoms.  2. Radial artery spasm during the procedure necessitated robust sedation and use of intra-arterial NTG.   ASSESSMENT AND PLAN:  1.  Palpitations: Currently, it is difficult to say as the cause of her palpitations. She does have them on a daily basis. Her EKG does not provide any insight. We'll plan for a 48 hour monitor.  2. Hypertension: Blood pressure is elevated today. We'll increase her carvedilol to 12.5 mg twice a day.  3. Hyperlipidemia: Currently on Crestor. No changes at this time.  4. Coronary artery disease: No current chest pain, other than with palpitations. Unlikely to be coronary in nature. Continue current management.  Current medicines are reviewed at length with the patient today.   The patient does not have concerns regarding her medicines.  The following changes were made today:  none  Labs/ tests ordered today include:  Orders Placed This Encounter  Procedures  . Holter monitor - 48 hour     Disposition:   FU with Sandie Swayze pending monitor  months  Signed, Jarold Macomber Meredith Leeds, MD  12/14/2016 3:47 PM     [address] [place] [address] [phone] (office) [phone] (fax)

## 2016-12-14 NOTE — Patient Instructions (Addendum)
Medication Instructions:    Your physician has recommended you make the following change in your medication:  1) INCREASE Carvedilol to 12.5 mg twice daily  - If you need a refill on your cardiac medications before your next appointment, please call your pharmacy.   Labwork:  None ordered  Testing/Procedures: Your physician has recommended that you wear a 48 hour holter monitor. Holter monitors are medical devices that record the heart's electrical activity. Doctors most often use these monitors to diagnose arrhythmias. Arrhythmias are problems with the speed or rhythm of the heartbeat. The monitor is a small, portable device. You can wear one while you do your normal daily activities. This is usually used to diagnose what is causing palpitations/syncope (passing out).  Follow-Up:  To be determined after holter monitor  Thank you for choosing [facility]!!   Trinidad Curet, RN [phone]  Any Other Special Instructions Will Be Listed Below (If Applicable).   Holter Monitoring A Holter monitor is a small device that is used to detect abnormal heart rhythms. It clips to your clothing and is connected by wires to flat, sticky disks (electrodes) that attach to your chest. It is worn continuously for 24-48 hours. Follow these instructions at home:  Wear your Holter monitor at all times, even while exercising and sleeping, for as long as directed by your health care provider.  Make sure that the Holter monitor is safely clipped to your clothing or close to your body as recommended by your health care provider.  Do not get the monitor or wires wet.  Do not put body lotion or moisturizer on your chest.  Keep your skin clean.  Keep a diary of your daily activities, such as walking and doing chores. If you feel that your heartbeat is abnormal or that your heart is fluttering or skipping a beat: ? Record what you are doing when it happens. ? Record what time of day the symptoms  occur.  Return your Holter monitor as directed by your health care provider.  Keep all follow-up visits as directed by your health care provider. This is important. Get help right away if:  You feel lightheaded or you faint.  You have trouble breathing.  You feel pain in your chest, upper arm, or jaw.  You feel sick to your stomach and your skin is pale, cool, or damp.  You heartbeat feels unusual or abnormal. This information is not intended to replace advice given to you by your health care provider. Make sure you discuss any questions you have with your health care provider. Document Released: 02/20/2004 Document Revised: 10/30/2015 Document Reviewed: 12/31/2013 Elsevier Interactive Patient Education  [employer].

## 2017-01-23 ENCOUNTER — Emergency Department (HOSPITAL_COMMUNITY): Payer: Medicaid Other

## 2017-01-23 ENCOUNTER — Ambulatory Visit (HOSPITAL_COMMUNITY)
Admission: EM | Admit: 2017-01-23 | Discharge: 2017-01-23 | Disposition: A | Discharge status: Home / Self Care | Payer: Medicaid Other | Attending: Family Medicine | Admitting: Family Medicine

## 2017-01-23 ENCOUNTER — Ambulatory Visit (INDEPENDENT_AMBULATORY_CARE_PROVIDER_SITE_OTHER): Payer: Medicaid Other

## 2017-01-23 ENCOUNTER — Encounter (HOSPITAL_COMMUNITY): Payer: Self-pay | Admitting: Nurse Practitioner

## 2017-01-23 ENCOUNTER — Emergency Department (HOSPITAL_COMMUNITY)
Admission: EM | Admit: 2017-01-23 | Discharge: 2017-01-23 | Disposition: A | Discharge status: Home / Self Care | Payer: Medicaid Other | Attending: Emergency Medicine | Admitting: Emergency Medicine

## 2017-01-23 ENCOUNTER — Encounter (HOSPITAL_COMMUNITY): Payer: Self-pay | Admitting: Family Medicine

## 2017-01-23 DIAGNOSIS — R350 Frequency of micturition: Secondary | ICD-10-CM | POA: Diagnosis not present

## 2017-01-23 DIAGNOSIS — E039 Hypothyroidism, unspecified: Secondary | ICD-10-CM | POA: Diagnosis not present

## 2017-01-23 DIAGNOSIS — R110 Nausea: Secondary | ICD-10-CM | POA: Insufficient documentation

## 2017-01-23 DIAGNOSIS — Z7982 Long term (current) use of aspirin: Secondary | ICD-10-CM | POA: Diagnosis not present

## 2017-01-23 DIAGNOSIS — R102 Pelvic and perineal pain: Secondary | ICD-10-CM | POA: Diagnosis not present

## 2017-01-23 DIAGNOSIS — I252 Old myocardial infarction: Secondary | ICD-10-CM | POA: Insufficient documentation

## 2017-01-23 DIAGNOSIS — R05 Cough: Secondary | ICD-10-CM | POA: Insufficient documentation

## 2017-01-23 DIAGNOSIS — J209 Acute bronchitis, unspecified: Secondary | ICD-10-CM

## 2017-01-23 DIAGNOSIS — J069 Acute upper respiratory infection, unspecified: Secondary | ICD-10-CM

## 2017-01-23 DIAGNOSIS — Z8673 Personal history of transient ischemic attack (TIA), and cerebral infarction without residual deficits: Secondary | ICD-10-CM | POA: Diagnosis not present

## 2017-01-23 DIAGNOSIS — R109 Unspecified abdominal pain: Secondary | ICD-10-CM

## 2017-01-23 DIAGNOSIS — R509 Fever, unspecified: Secondary | ICD-10-CM | POA: Insufficient documentation

## 2017-01-23 DIAGNOSIS — I10 Essential (primary) hypertension: Secondary | ICD-10-CM | POA: Diagnosis not present

## 2017-01-23 DIAGNOSIS — R1031 Right lower quadrant pain: Secondary | ICD-10-CM

## 2017-01-23 DIAGNOSIS — Z79899 Other long term (current) drug therapy: Secondary | ICD-10-CM | POA: Insufficient documentation

## 2017-01-23 LAB — CBC WITH DIFFERENTIAL/PLATELET
Basophils Absolute: 0 10*3/uL (ref 0.0–0.1)
Basophils Relative: 0 %
EOS PCT: 2 %
Eosinophils Absolute: 0.2 10*3/uL (ref 0.0–0.7)
HEMATOCRIT: 38.8 % (ref 36.0–46.0)
Hemoglobin: 13.8 g/dL (ref 12.0–15.0)
LYMPHS ABS: 1 10*3/uL (ref 0.7–4.0)
LYMPHS PCT: 11 %
MCH: 29.6 pg (ref 26.0–34.0)
MCHC: 35.6 g/dL (ref 30.0–36.0)
MCV: 83.3 fL (ref 78.0–100.0)
MONO ABS: 0.4 10*3/uL (ref 0.1–1.0)
Monocytes Relative: 5 %
Neutro Abs: 7.4 10*3/uL (ref 1.7–7.7)
Neutrophils Relative %: 82 %
PLATELETS: 265 10*3/uL (ref 150–400)
RBC: 4.66 MIL/uL (ref 3.87–5.11)
RDW: 12.6 % (ref 11.5–15.5)
WBC: 9 10*3/uL (ref 4.0–10.5)

## 2017-01-23 LAB — COMPREHENSIVE METABOLIC PANEL
ALT: 36 U/L (ref 14–54)
ANION GAP: 9 (ref 5–15)
AST: 33 U/L (ref 15–41)
Albumin: 4.4 g/dL (ref 3.5–5.0)
Alkaline Phosphatase: 117 U/L (ref 38–126)
BILIRUBIN TOTAL: 0.3 mg/dL (ref 0.3–1.2)
BUN: 14 mg/dL (ref 6–20)
CHLORIDE: 105 mmol/L (ref 101–111)
CO2: 24 mmol/L (ref 22–32)
Calcium: 9.3 mg/dL (ref 8.9–10.3)
Creatinine, Ser: 1 mg/dL (ref 0.44–1.00)
Glucose, Bld: 114 mg/dL — ABNORMAL HIGH (ref 65–99)
POTASSIUM: 4.3 mmol/L (ref 3.5–5.1)
Sodium: 138 mmol/L (ref 135–145)
Total Protein: 7.7 g/dL (ref 6.5–8.1)

## 2017-01-23 LAB — URINALYSIS, ROUTINE W REFLEX MICROSCOPIC
Bacteria, UA: NONE SEEN
Bilirubin Urine: NEGATIVE
GLUCOSE, UA: NEGATIVE mg/dL
Ketones, ur: NEGATIVE mg/dL
NITRITE: NEGATIVE
Protein, ur: NEGATIVE mg/dL
SPECIFIC GRAVITY, URINE: 1.015 (ref 1.005–1.030)
pH: 5 (ref 5.0–8.0)

## 2017-01-23 MED ORDER — BENZONATATE 100 MG PO CAPS: 100.0000 mg | ORAL_CAPSULE | Freq: Three times a day (TID) | ORAL | 0 refills | Status: DC

## 2017-01-23 MED ORDER — MORPHINE SULFATE (PF) 4 MG/ML IV SOLN
6.0000 mg | Freq: Once | INTRAVENOUS | Status: AC
  Administered 2017-01-23: 6 mg via INTRAVENOUS
  Filled 2017-01-23: qty 2

## 2017-01-23 MED ORDER — ALBUTEROL SULFATE HFA 108 (90 BASE) MCG/ACT IN AERS
2.0000 | INHALATION_SPRAY | Freq: Once | RESPIRATORY_TRACT | Status: AC
  Administered 2017-01-23: 2 via RESPIRATORY_TRACT
  Filled 2017-01-23: qty 6.7

## 2017-01-23 MED ORDER — ONDANSETRON HCL 4 MG/2ML IJ SOLN
4.0000 mg | Freq: Once | INTRAMUSCULAR | Status: AC
  Administered 2017-01-23: 4 mg via INTRAVENOUS
  Filled 2017-01-23: qty 2

## 2017-01-23 MED ORDER — CLONIDINE HCL 0.1 MG PO TABS
ORAL_TABLET | ORAL | Status: AC
  Filled 2017-01-23: qty 1

## 2017-01-23 MED ORDER — PREDNISONE 20 MG PO TABS: ORAL_TABLET | ORAL | 0 refills | Status: DC

## 2017-01-23 MED ORDER — CLONIDINE HCL 0.1 MG PO TABS
0.1000 mg | ORAL_TABLET | Freq: Once | ORAL | Status: AC
  Administered 2017-01-23: 0.1 mg via ORAL

## 2017-01-23 MED ORDER — AZITHROMYCIN 250 MG PO TABS: 250.0000 mg | ORAL_TABLET | Freq: Every day | ORAL | 0 refills | Status: DC

## 2017-01-23 MED ORDER — AMOXICILLIN 875 MG PO TABS: 875.0000 mg | ORAL_TABLET | Freq: Two times a day (BID) | ORAL | 0 refills | Status: DC

## 2017-01-23 MED ORDER — ACETAMINOPHEN 325 MG PO TABS
650.0000 mg | ORAL_TABLET | Freq: Once | ORAL | Status: AC
  Administered 2017-01-23: 650 mg via ORAL
  Filled 2017-01-23: qty 2

## 2017-01-23 MED ORDER — HYDROCOD POLST-CPM POLST ER 10-8 MG/5ML PO SUER
5.0000 mL | Freq: Once | ORAL | Status: AC
  Administered 2017-01-23: 5 mL via ORAL
  Filled 2017-01-23: qty 5

## 2017-01-23 NOTE — ED Triage Notes (Signed)
Pt is c/o severe bilateral flank pain that she is attributing to ongoing kidney problems that she notes she had renal stents placed in the past. She denies dysuria, reports subjective fever, rating her pain 8/10 after EMS administered 150 mcg IV Fentanyl en route.

## 2017-01-23 NOTE — ED Triage Notes (Signed)
Pt here for cough, congestion, body aches and chills.

## 2017-01-23 NOTE — ED Provider Notes (Signed)
[facility] DEPT Provider Note   CSN: [id number] Arrival date & time: 01/23/17  1552     History   Chief Complaint Chief Complaint  Patient presents with  . Flank Pain    HPI Jesus Riddell is a 56 y.o. female.  HPI Gwendolynn Achey is a 56 y.o. femalepresents to emergency department complaining of right-sided flank pain that started suddenly 2 hours ago. Patient states she has had URI symptoms and cough. She states she went to urgent care today where she was diagnosed with bronchitis. She states she went to the pharmacy to pick up her prescription for antibiotic where she suddenly started feeling the sharp pain. Pain radiates from the right flank and to the right lower abdomen. Reports associated nausea, no vomiting. Reports frequency with urination onset a few days ago. Denies hematuria or dysuria. States has constant urgency to urinate. She reports associated fever and chills but attributes it to her upper respiratory tract infection. No changes in her bowels.  Past Medical History:  Diagnosis Date  . Adenomyosis   . Anginal pain ([place])   . Anxiety 2001  . Chest pain    a. No angiographic CAD by cath 2009 in [place]. There was catheter-induced RCA vasospasm versus microvascular obstruction. bCarlton Adam myoview 04/2010 - EF 79%, normal perfusion.;  c. ETT-Myoview 7/13: no ischemia, EF 77%, submax exercise  . Chronic back pain    had 3 epidural injections on 02-14-13  . Complication of anesthesia    has difficulty voiding after surgery  . Coronary vasospasm ([place])   . CVA (cerebral vascular accident) ([place]) 2006   lost hearing on left  . Depression 2001  . Dysrhythmia   . Fibromuscular dysplasia ([place]) 2009   a. Renal artery stenosis due to this. b. Carotid dopplers 07/2011 c/w FMD but no significant stenosis  . Fibromyalgia 1999  . H/O Graves' disease 2003   s/p radio-iodine treatment in 2003   . Hearing loss of left ear    s/p CVA 2006  . History of stent insertion of  renal artery 2009   right renal stent  . HLD (hyperlipidemia) 2000  . HTN (hypertension) 1988   a. Likely related to RAS; urinary metanephrines and plasma renin/aldosterone ratio normal. b. H/o HTN urgency 06/2011 after being off BP meds for a number of days  . Hx of echocardiogram    a. Echo (1/13): EF 74-25%, grade 1 diastolic dysfunction  . Hypertensive crisis 06/29/2011  . Hypothyroidism 2003   Graves disease s/p radio-iodine treatment in 2003   . Irritable bowel syndrome   . Leiomyoma   . Migraine headache   . Myocardial infarction ([place]) 2009  . Personal history of colonic polyps-sessile serrated adenoma 02/23/2013  . Renal artery stenosis ([place])    a. Due to fibromuscular dysplasia. b. Renal stent 2009. c. Studies:  CTA abdomen (1/13) with evidence for fibromuscular dysplasia, right renal artery stent appears patent. Renal artery dopplers (2/13) with FMD but no evidence for signficant stenosis.   . Tachycardia 2000   10/27/10-Holter -NSR with occ sinus tachy-rare PACs, no sig arrhythmia    Patient Active Problem List   Diagnosis Date Noted  . Poor sleep pattern 10/20/2016  . ACS (acute coronary syndrome) ([place]) 08/30/2016  . Fatigue 04/07/2016  . Neck pain on left side 03/16/2016  . Acute left-sided thoracic back pain 03/16/2016  . Pain of left breast 10/30/2015  . Itching 08/26/2014  . Lichen sclerosus 95/63/8756  . Extremity edema 06/24/2014  .  History of hypokalemia 04/22/2014  . Diplopia   . History of coronary vasospasm 04/12/2014  . Bursitis, trochanteric 11/22/2013  . Essential hypertension 11/19/2013  . Acute low back pain 08/10/2013  . Left-sided weakness 08/07/2013  . Personal history of colonic polyps-sessile serrated adenoma 02/23/2013  . HNP (herniated nucleus pulposus), lumbar 12/01/2012  . Lumbar radicular syndrome 12/01/2012  . Seasonal allergies 11/30/2012  . Intervertebral disc protrusion 11/06/2012  . Situational anxiety 09/05/2012  . Vitamin D  insufficiency 06/30/2012  . Unilateral hearing loss 06/30/2012  . Vestibular dizziness 06/30/2012  . Routine adult health maintenance 03/31/2012  . Fibromuscular dysplasia ([place]) 08/13/2011  . Dizziness and palpitations, episodic 07/06/2011  . Poorly-controlled hypertension   . History of CVA (cerebrovascular accident)   . Coronary vasospasm ([place])   . Hypothyroidism 04/10/2010  . Dyslipidemia 04/10/2010  . Anxiety 04/10/2010  . Depression 04/10/2010  . Migraine headache 04/10/2010  . RENAL ARTERY STENOSIS 04/10/2010  . Fibromyalgia 04/10/2010  . Status post lumbar surgery 04/10/2010  . Hypotension 04/09/2010  . Backache 04/09/2010    Past Surgical History:  Procedure Laterality Date  . BACK SURGERY  2010 and 2011   In CT.  11/19/2008 (possibly a laminectomy) and in May of 2011 she underwent a L2-L3 fusion.  . cardiac cath    . CARDIAC CATHETERIZATION  2009   states it was normal  . CARDIAC CATHETERIZATION N/A 02/03/2015   Procedure: Left Heart Cath and Coronary Angiography;  Surgeon: Larey Dresser, MD;  Location: [facility];  Service: Cardiovascular;  Laterality: N/A;  . COLONOSCOPY    . COLONOSCOPY    . HIP SURGERY Bilateral 1988 and 1989   "scraped head of femur"  . KNEE ARTHROSCOPY Left   . LUMBAR LAMINECTOMY/DECOMPRESSION MICRODISCECTOMY Left 05/25/2013   Procedure: LUMBAR LAMINECTOMY/DECOMPRESSION MICRODISCECTOMY 1 LEVEL;  Surgeon: Winfield Cunas, MD;  Location: [facility];  Service: Neurosurgery;  Laterality: Left;  LEFT L5S1 microdiskectomy  . RENAL ARTERY STENT  2009  . ROBOTIC ASSISTED LAP VAGINAL HYSTERECTOMY  05/13/2008   [facility] hysterectomy  for myomatous uterus   . SPINE SURGERY    . VAGINAL HYSTERECTOMY      OB History    No data available       Home Medications    Prior to Admission medications   Medication Sig Start Date End Date Taking? Authorizing Provider  amoxicillin (AMOXIL) 875 MG tablet Take 1 tablet (875 mg total) by mouth  2 (two) times daily. 01/23/17   Robyn Haber, MD  aspirin EC 81 MG tablet Take 81 mg by mouth daily.    [provider]  azithromycin (ZITHROMAX) 250 MG tablet Take 1 tablet (250 mg total) by mouth daily. Take first 2 tablets together, then 1 every day until finished. 01/23/17   Robyn Haber, MD  carvedilol (COREG) 12.5 MG tablet Take 1 tablet (12.5 mg total) by mouth 2 (two) times daily. 12/14/16 03/14/17  Camnitz, Ocie Doyne, MD  diltiazem (DILACOR XR) 180 MG 24 hr capsule Take 2 capsules (360 mg total) by mouth daily. 09/10/16   Sela Hilding, MD  DULoxetine (CYMBALTA) 60 MG capsule Take 1 capsule (60 mg total) by mouth daily. 07/29/16   [name], MD  levothyroxine (SYNTHROID, LEVOTHROID) 125 MCG tablet Take 1 tablet (125 mcg total) by mouth daily. 09/10/16   Sela Hilding, MD  linaclotide [facility]) 290 MCG CAPS capsule Take 1 capsule (290 mcg total) by mouth daily. 11/19/16   [name],  MD  nitroGLYCERIN (NITROSTAT) 0.4 MG SL tablet Place 1 tablet (0.4 mg total) under the tongue every 5 (five) minutes as needed. x3 doses as needed for chest pain 03/22/16   [name], MD  predniSONE (DELTASONE) 20 MG tablet Two daily with food 01/23/17   Robyn Haber, MD  QUEtiapine (SEROQUEL) 100 MG tablet Take 100 mg by mouth at bedtime.    [provider]  rosuvastatin (CRESTOR) 40 MG tablet Take 1 tablet (40 mg total) by mouth daily. 09/10/16   Sela Hilding, MD  traZODone (DESYREL) 100 MG tablet Take 100 mg by mouth at bedtime.    [provider]    Family History Family History  Problem Relation Age of Onset  . Hypertension Mother   . Asthma Mother   . Hyperlipidemia Mother   . Stroke Mother   . Hypertension Father   . Prostate cancer Father   . Heart attack Father   . Hyperlipidemia Father   . Stroke Father   . Heart attack Unknown   . Stroke Unknown   . Hypertension Brother   . Heart attack Brother 50  . Early death Brother 3        massive stroke   . Hyperlipidemia Brother   . Metabolic syndrome Sister   . Depression Sister   . Hypertension Sister   . Diabetes Maternal Aunt   . Colon cancer Neg Hx   . Stomach cancer Neg Hx     Social History Social History  Substance Use Topics  . Smoking status: Never Smoker  . Smokeless tobacco: Never Used  . Alcohol use Yes     Comment: rarely     Allergies   Hydromorphone hcl; Prochlorperazine edisylate; Sumatriptan; Atorvastatin; and Tramadol   Review of Systems Review of Systems  Constitutional: Negative for chills and fever.  Respiratory: Negative for cough, chest tightness and shortness of breath.   Cardiovascular: Negative for chest pain, palpitations and leg swelling.  Gastrointestinal: Positive for abdominal pain and nausea. Negative for diarrhea and vomiting.  Genitourinary: Positive for flank pain, frequency and urgency. Negative for dysuria, pelvic pain, vaginal bleeding, vaginal discharge and vaginal pain.  Musculoskeletal: Negative for arthralgias, myalgias, neck pain and neck stiffness.  Skin: Negative for rash.  Neurological: Negative for dizziness, weakness and headaches.  All other systems reviewed and are negative.    Physical Exam Updated Vital Signs BP (!) 142/98   Pulse 93   Temp 99.7 F (37.6 C) (Oral)   Resp 18   SpO2 98%   Physical Exam  Constitutional: She appears well-developed and well-nourished. No distress.  HENT:  Head: Normocephalic.  Eyes: Conjunctivae are normal.  Neck: Neck supple.  Cardiovascular: Normal rate, regular rhythm and normal heart sounds.   Pulmonary/Chest: Effort normal and breath sounds normal. No respiratory distress. She has no wheezes. She has no rales.  Abdominal: Soft. Bowel sounds are normal. She exhibits no distension. There is tenderness. There is no rebound.  Right CVA tenderness. Right lower abdominal tenderness  Musculoskeletal: She exhibits no edema.  Neurological: She is alert.  Skin: Skin  is warm and dry.  Psychiatric: She has a normal mood and affect. Her behavior is normal.  Nursing note and vitals reviewed.    ED Treatments / Results  Labs (all labs ordered are listed, but only abnormal results are displayed) Labs Reviewed  URINALYSIS, ROUTINE W REFLEX MICROSCOPIC - Abnormal; Notable for the following:       Result Value   Hgb  urine dipstick SMALL (*)    Leukocytes, UA LARGE (*)    Squamous Epithelial / LPF 0-5 (*)    All other components within normal limits  COMPREHENSIVE METABOLIC PANEL - Abnormal; Notable for the following:    Glucose, Bld 114 (*)    All other components within normal limits  CBC WITH DIFFERENTIAL/PLATELET    EKG  EKG Interpretation None       Radiology Dg Chest 2 View  Result Date: 01/23/2017 CLINICAL DATA:  Cough and congestion EXAM: CHEST  2 VIEW COMPARISON:  August 29, 2016 FINDINGS: Lungs are clear. Heart size and pulmonary vascularity are normal. No adenopathy. Incidental note is made of spina bifida occulta at T1. IMPRESSION: No edema or consolidation. Electronically Signed   By: Lowella Grip III M.D.   On: 01/23/2017 14:05   US Transvaginal Non-ob  Result Date: 01/23/2017 CLINICAL DATA:  Pelvic pain for 1 day.  Post hysterectomy. EXAM: TRANSABDOMINAL AND TRANSVAGINAL ULTRASOUND OF PELVIS DOPPLER ULTRASOUND OF OVARIES TECHNIQUE: Both transabdominal and transvaginal ultrasound examinations of the pelvis were performed. Transabdominal technique was performed for global imaging of the pelvis including uterus, ovaries, adnexal regions, and pelvic cul-de-sac. It was necessary to proceed with endovaginal exam following the transabdominal exam to visualize the adnexa. Color and duplex Doppler ultrasound was utilized to evaluate blood flow to the ovaries. COMPARISON:  CT of the abdomen and pelvis 01/23/2017 FINDINGS: Uterus Surgically absent. Right ovary Measurements: 3.0 x 2.1 x 2.5 cm. Normal appearance/no adnexal mass. Left ovary  Measurements: 2.6 x 1.6 x 2.0 cm. Normal appearance/no adnexal mass. Pulsed Doppler evaluation of both ovaries demonstrates normal low-resistance arterial and venous waveforms. Other findings No abnormal free fluid. IMPRESSION: Normal pelvic ultrasound, post hysterectomy. Electronically Signed   By: Fidela Salisbury M.D.   On: 01/23/2017 22:52   US Pelvis Complete  Result Date: 01/23/2017 CLINICAL DATA:  Pelvic pain for 1 day.  Post hysterectomy. EXAM: TRANSABDOMINAL AND TRANSVAGINAL ULTRASOUND OF PELVIS DOPPLER ULTRASOUND OF OVARIES TECHNIQUE: Both transabdominal and transvaginal ultrasound examinations of the pelvis were performed. Transabdominal technique was performed for global imaging of the pelvis including uterus, ovaries, adnexal regions, and pelvic cul-de-sac. It was necessary to proceed with endovaginal exam following the transabdominal exam to visualize the adnexa. Color and duplex Doppler ultrasound was utilized to evaluate blood flow to the ovaries. COMPARISON:  CT of the abdomen and pelvis 01/23/2017 FINDINGS: Uterus Surgically absent. Right ovary Measurements: 3.0 x 2.1 x 2.5 cm. Normal appearance/no adnexal mass. Left ovary Measurements: 2.6 x 1.6 x 2.0 cm. Normal appearance/no adnexal mass. Pulsed Doppler evaluation of both ovaries demonstrates normal low-resistance arterial and venous waveforms. Other findings No abnormal free fluid. IMPRESSION: Normal pelvic ultrasound, post hysterectomy. Electronically Signed   By: Fidela Salisbury M.D.   On: 01/23/2017 22:52   [place] Art/ven Flow Abd Pelv Doppler  Result Date: 01/23/2017 CLINICAL DATA:  Pelvic pain for 1 day.  Post hysterectomy. EXAM: TRANSABDOMINAL AND TRANSVAGINAL ULTRASOUND OF PELVIS DOPPLER ULTRASOUND OF OVARIES TECHNIQUE: Both transabdominal and transvaginal ultrasound examinations of the pelvis were performed. Transabdominal technique was performed for global imaging of the pelvis including uterus, ovaries, adnexal regions, and  pelvic cul-de-sac. It was necessary to proceed with endovaginal exam following the transabdominal exam to visualize the adnexa. Color and duplex Doppler ultrasound was utilized to evaluate blood flow to the ovaries. COMPARISON:  CT of the abdomen and pelvis 01/23/2017 FINDINGS: Uterus Surgically absent. Right ovary Measurements: 3.0 x 2.1 x 2.5 cm. Normal appearance/no adnexal  mass. Left ovary Measurements: 2.6 x 1.6 x 2.0 cm. Normal appearance/no adnexal mass. Pulsed Doppler evaluation of both ovaries demonstrates normal low-resistance arterial and venous waveforms. Other findings No abnormal free fluid. IMPRESSION: Normal pelvic ultrasound, post hysterectomy. Electronically Signed   By: Fidela Salisbury M.D.   On: 01/23/2017 22:52   Ct Renal Stone Study  Result Date: 01/23/2017 CLINICAL DATA:  Severe bilateral flank pain. History of renal stents in the past. No dysuria but reports subjective fever. EXAM: CT ABDOMEN AND PELVIS WITHOUT CONTRAST TECHNIQUE: Multidetector CT imaging of the abdomen and pelvis was performed following the standard protocol without IV contrast. COMPARISON:  04/12/2014 CT, lumbar spine MRI 11/28/2015 FINDINGS: Lower chest: The included heart is normal in size. No pericardial effusion acute pulmonary disease. There is a small hiatal hernia. Minimal atelectasis is noted at each base. Hepatobiliary: No focal liver abnormality is seen. No gallstones, gallbladder wall thickening, or biliary dilatation. Pancreas: Unremarkable. No pancreatic ductal dilatation or surrounding inflammatory changes. Spleen: Normal in size without focal abnormality. Adrenals/Urinary Tract: Adrenal glands are unremarkable. Smaller right kidney with areas of minimal cortical thinning, similar in appearance to prior. No nephrolithiasis or obstructive uropathy. Bladder is unremarkable. Stomach/Bowel: The stomach is contracted. There is normal small bowel rotation without bowel obstruction or inflammation. A moderate  amount of fecal retention is seen within large bowel from cecum through splenic flexure. Normal caliber appendix. Vascular/Lymphatic: Again noted is a right renal arterial stent along its midportion. Minimal atherosclerosis of the native left renal artery origin. The aorta is not aneurysmal. There is minimal aortoiliac atherosclerosis. Reproductive: Status post hysterectomy. No adnexal masses. Other: Small fat containing umbilical hernia. No abdominopelvic ascites. Musculoskeletal: Spinal decompression and posterior fusion across L2-3. IMPRESSION: 1. Small hiatal hernia.  Small fat containing umbilical hernia. 2. No nephrolithiasis nor obstructive uropathy. 3. Right renal artery stent is again noted with minimal cortical thinning of the right kidney. 4. Spinal decompression and posterior fusion across the L2-3. 5. Moderate fecal retention from cecum through splenic flexure. Electronically Signed   By: Ashley Royalty M.D.   On: 01/23/2017 19:52    Procedures Procedures (including critical care time)  Medications Ordered in ED Medications  morphine 4 MG/ML injection 6 mg (6 mg Intravenous Given 01/23/17 1907)  ondansetron (ZOFRAN) injection 4 mg (4 mg Intravenous Given 01/23/17 1907)     Initial Impression / Assessment and Plan / ED Course  I have reviewed the triage vital signs and the nursing notes.  Pertinent labs & imaging results that were available during my care of the patient were reviewed by me and considered in my medical decision making (see chart for details).     Patient in emergency pain was sudden onset of right flank pain radiating to the right abdomen. She appears to be very uncomfortable. Will order pain medications. Will get a urinalysis and labs. Will get CT renal to rule out kidney stone. Patient's abdomen is soft, no rigidity on exam. I do not think she has surgical abdomen. We'll monitor and reassess.  8:30 PM CT renal is negative. Patient continues to have pain but now pain is  more localized in the right lower quadrant/adnexa. Patient has had partial hysterectomy and states she still has her ovaries. Her appendix is visualized on CT and is normal so I do not think this is appendicitis. Will get ultrasound Doppler to rule out ovarian torsion given sudden onset of severe pain. On reevaluation, patient is also coughing nonstop. Mild wheezing noted on  the lung exam. We'll given an inhaler and cough medication.  11:02 PM Patient is feeling much better. Pelvic ultrasound is unremarkable. Vital signs are stable. We'll discharge home with inhaler and cough medication for her URI. She has an antibiotic already that she got filled by her PCP. Instructed to take Tylenol or Motrin for pain. Question whether this could be musculoskeletal pain from her coughing. Follow-up with primary care doctor. Return if worsening symptoms.  Vitals:   01/23/17 1618 01/23/17 2200  BP: (!) 142/98 (!) 162/91  Pulse: 93 66  Resp: 18 16  Temp: 99.7 F (37.6 C)   TempSrc: Oral   SpO2: 98% 96%     Final Clinical Impressions(s) / ED Diagnoses   Final diagnoses:  Adnexal pain  Flank pain  Right lower quadrant abdominal pain  Upper respiratory tract infection, unspecified type    New Prescriptions New Prescriptions   BENZONATATE (TESSALON) 100 MG CAPSULE    Take 1 capsule (100 mg total) by mouth every 8 (eight) hours.     Jeannett Senior, PA-C 01/23/17 2303    Veryl Speak, MD 01/23/17 [phone]

## 2017-01-23 NOTE — Discharge Instructions (Signed)
Take Tylenol or Motrin for pain. Use inhaler 2 puffs every 4 hours. Take Tessalon as prescribed as needed for cough. Take medications that were prescribed by a doctor as prescribed. Follow-up with your doctor in 2-3 days for recheck. Return if worsening symptoms

## 2017-01-23 NOTE — ED Provider Notes (Signed)
[place]    CSN: [id number] Arrival date & time: 01/23/17  1222     History   Chief Complaint Chief Complaint  Patient presents with  . Cough  . Nasal Congestion    HPI Leighla Romberger is a 56 y.o. female.   This Is a 56 year old woman who comes to the [facility] urgent care center with cough, congestion, and body aches.  She is a nonsmoker. She says that at one point in her life she was given an inhaler, but she's never been diagnosed with asthma.  Patient has been taking many over-the-counter medicines. She is a known hypertensive and did not take her blood pressure medicines morning.      Past Medical History:  Diagnosis Date  . Adenomyosis   . Anginal pain ([place])   . Anxiety 2001  . Chest pain    a. No angiographic CAD by cath 2009 in [place]. There was catheter-induced RCA vasospasm versus microvascular obstruction. bCarlton Adam myoview 04/2010 - EF 79%, normal perfusion.;  c. ETT-Myoview 7/13: no ischemia, EF 77%, submax exercise  . Chronic back pain    had 3 epidural injections on 02-14-13  . Complication of anesthesia    has difficulty voiding after surgery  . Coronary vasospasm ([place])   . CVA (cerebral vascular accident) ([place]) 2006   lost hearing on left  . Depression 2001  . Dysrhythmia   . Fibromuscular dysplasia ([place]) 2009   a. Renal artery stenosis due to this. b. Carotid dopplers 07/2011 c/w FMD but no significant stenosis  . Fibromyalgia 1999  . H/O Graves' disease 2003   s/p radio-iodine treatment in 2003   . Hearing loss of left ear    s/p CVA 2006  . History of stent insertion of renal artery 2009   right renal stent  . HLD (hyperlipidemia) 2000  . HTN (hypertension) 1988   a. Likely related to RAS; urinary metanephrines and plasma renin/aldosterone ratio normal. b. H/o HTN urgency 06/2011 after being off BP meds for a number of days  . Hx of echocardiogram    a. Echo (1/13): EF 63-78%, grade 1 diastolic  dysfunction  . Hypertensive crisis 06/29/2011  . Hypothyroidism 2003   Graves disease s/p radio-iodine treatment in 2003   . Irritable bowel syndrome   . Leiomyoma   . Migraine headache   . Myocardial infarction ([place]) 2009  . Personal history of colonic polyps-sessile serrated adenoma 02/23/2013  . Renal artery stenosis ([place])    a. Due to fibromuscular dysplasia. b. Renal stent 2009. c. Studies:  CTA abdomen (1/13) with evidence for fibromuscular dysplasia, right renal artery stent appears patent. Renal artery dopplers (2/13) with FMD but no evidence for signficant stenosis.   . Tachycardia 2000   10/27/10-Holter -NSR with occ sinus tachy-rare PACs, no sig arrhythmia    Patient Active Problem List   Diagnosis Date Noted  . Poor sleep pattern 10/20/2016  . ACS (acute coronary syndrome) ([place]) 08/30/2016  . Fatigue 04/07/2016  . Neck pain on left side 03/16/2016  . Acute left-sided thoracic back pain 03/16/2016  . Pain of left breast 10/30/2015  . Itching 08/26/2014  . Lichen sclerosus 58/85/0277  . Extremity edema 06/24/2014  . History of hypokalemia 04/22/2014  . Diplopia   . History of coronary vasospasm 04/12/2014  . Bursitis, trochanteric 11/22/2013  . Essential hypertension 11/19/2013  . Acute low back pain 08/10/2013  . Left-sided weakness 08/07/2013  . Personal history of colonic polyps-sessile  serrated adenoma 02/23/2013  . HNP (herniated nucleus pulposus), lumbar 12/01/2012  . Lumbar radicular syndrome 12/01/2012  . Seasonal allergies 11/30/2012  . Intervertebral disc protrusion 11/06/2012  . Situational anxiety 09/05/2012  . Vitamin D insufficiency 06/30/2012  . Unilateral hearing loss 06/30/2012  . Vestibular dizziness 06/30/2012  . Routine adult health maintenance 03/31/2012  . Fibromuscular dysplasia ([place]) 08/13/2011  . Dizziness and palpitations, episodic 07/06/2011  . Poorly-controlled hypertension   . History of CVA (cerebrovascular accident)   . Coronary  vasospasm ([place])   . Hypothyroidism 04/10/2010  . Dyslipidemia 04/10/2010  . Anxiety 04/10/2010  . Depression 04/10/2010  . Migraine headache 04/10/2010  . RENAL ARTERY STENOSIS 04/10/2010  . Fibromyalgia 04/10/2010  . Status post lumbar surgery 04/10/2010  . Hypotension 04/09/2010  . Backache 04/09/2010    Past Surgical History:  Procedure Laterality Date  . BACK SURGERY  2010 and 2011   In CT.  11/19/2008 (possibly a laminectomy) and in May of 2011 she underwent a L2-L3 fusion.  . cardiac cath    . CARDIAC CATHETERIZATION  2009   states it was normal  . CARDIAC CATHETERIZATION N/A 02/03/2015   Procedure: Left Heart Cath and Coronary Angiography;  Surgeon: Larey Dresser, MD;  Location: [facility];  Service: Cardiovascular;  Laterality: N/A;  . COLONOSCOPY    . COLONOSCOPY    . HIP SURGERY Bilateral 1988 and 1989   "scraped head of femur"  . KNEE ARTHROSCOPY Left   . LUMBAR LAMINECTOMY/DECOMPRESSION MICRODISCECTOMY Left 05/25/2013   Procedure: LUMBAR LAMINECTOMY/DECOMPRESSION MICRODISCECTOMY 1 LEVEL;  Surgeon: Winfield Cunas, MD;  Location: [facility];  Service: Neurosurgery;  Laterality: Left;  LEFT L5S1 microdiskectomy  . RENAL ARTERY STENT  2009  . ROBOTIC ASSISTED LAP VAGINAL HYSTERECTOMY  05/13/2008   [facility] hysterectomy  for myomatous uterus   . SPINE SURGERY    . VAGINAL HYSTERECTOMY      OB History    No data available       Home Medications    Prior to Admission medications   Medication Sig Start Date End Date Taking? Authorizing Provider  amoxicillin (AMOXIL) 875 MG tablet Take 1 tablet (875 mg total) by mouth 2 (two) times daily. 01/23/17   Robyn Haber, MD  aspirin EC 81 MG tablet Take 81 mg by mouth daily.    [provider]  azithromycin (ZITHROMAX) 250 MG tablet Take 1 tablet (250 mg total) by mouth daily. Take first 2 tablets together, then 1 every day until finished. 01/23/17   Robyn Haber, MD  carvedilol (COREG) 12.5  MG tablet Take 1 tablet (12.5 mg total) by mouth 2 (two) times daily. 12/14/16 03/14/17  Camnitz, Ocie Doyne, MD  diltiazem (DILACOR XR) 180 MG 24 hr capsule Take 2 capsules (360 mg total) by mouth daily. 09/10/16   Sela Hilding, MD  DULoxetine (CYMBALTA) 60 MG capsule Take 1 capsule (60 mg total) by mouth daily. 07/29/16   [name], MD  levothyroxine (SYNTHROID, LEVOTHROID) 125 MCG tablet Take 1 tablet (125 mcg total) by mouth daily. 09/10/16   Sela Hilding, MD  linaclotide [facility]) 290 MCG CAPS capsule Take 1 capsule (290 mcg total) by mouth daily. 11/19/16   [name], MD  nitroGLYCERIN (NITROSTAT) 0.4 MG SL tablet Place 1 tablet (0.4 mg total) under the tongue every 5 (five) minutes as needed. x3 doses as needed for chest pain 03/22/16   [name], MD  predniSONE (DELTASONE) 20 MG tablet Two  daily with food 01/23/17   Robyn Haber, MD  QUEtiapine (SEROQUEL) 100 MG tablet Take 100 mg by mouth at bedtime.    [provider]  rosuvastatin (CRESTOR) 40 MG tablet Take 1 tablet (40 mg total) by mouth daily. 09/10/16   Sela Hilding, MD  traZODone (DESYREL) 100 MG tablet Take 100 mg by mouth at bedtime.    [provider]    Family History Family History  Problem Relation Age of Onset  . Hypertension Mother   . Asthma Mother   . Hyperlipidemia Mother   . Stroke Mother   . Hypertension Father   . Prostate cancer Father   . Heart attack Father   . Hyperlipidemia Father   . Stroke Father   . Heart attack Unknown   . Stroke Unknown   . Hypertension Brother   . Heart attack Brother 31  . Early death Brother 62       massive stroke   . Hyperlipidemia Brother   . Metabolic syndrome Sister   . Depression Sister   . Hypertension Sister   . Diabetes Maternal Aunt   . Colon cancer Neg Hx   . Stomach cancer Neg Hx     Social History Social History  Substance Use Topics  . Smoking status: Never Smoker  . Smokeless tobacco: Never Used  .  Alcohol use Yes     Comment: rarely     Allergies   Hydromorphone hcl; Prochlorperazine edisylate; Sumatriptan; Atorvastatin; and Tramadol   Review of Systems Review of Systems  Constitutional: Positive for chills and fatigue.  HENT: Positive for congestion.   Respiratory: Positive for cough.   Neurological: Positive for headaches.     Physical Exam Triage Vital Signs ED Triage Vitals  Enc Vitals Group     BP      Pulse      Resp      Temp      Temp src      SpO2      Weight      Height      Head Circumference      Peak Flow      Pain Score      Pain Loc      Pain Edu?      Excl. in [place]?    No data found.   Updated Vital Signs BP (!) 174/139   Pulse (!) 115   Temp 98.9 F (37.2 C)   Resp 18   SpO2 99%   Physical Exam  Constitutional: She is oriented to person, place, and time. She appears well-developed and well-nourished.  HENT:  Right Ear: External ear normal.  Left Ear: External ear normal.  Mouth/Throat: Oropharynx is clear and moist.  Eyes: Pupils are equal, round, and reactive to light. Conjunctivae are normal.  Neck: Normal range of motion. Neck supple.  Cardiovascular: Regular rhythm and normal heart sounds.   Pulmonary/Chest: Effort normal.  Few faint basilar rales bilaterally  Musculoskeletal: Normal range of motion.  Neurological: She is alert and oriented to person, place, and time.  Skin: Skin is warm and dry.  Vitals reviewed.    UC Treatments / Results  Labs (all labs ordered are listed, but only abnormal results are displayed) Labs Reviewed - No data to display  EKG  EKG Interpretation None       Radiology No results found.  Procedures Procedures (including critical care time)  Medications Ordered in UC Medications  cloNIDine (CATAPRES) tablet 0.1 mg (0.1  mg Oral Given 01/23/17 1349)     Initial Impression / Assessment and Plan / UC Course  I have reviewed the triage vital signs and the nursing  notes.  Pertinent labs & imaging results that were available during my care of the patient were reviewed by me and considered in my medical decision making (see chart for details).     Final Clinical Impressions(s) / UC Diagnoses   Final diagnoses:  Acute bronchitis, unspecified organism    New Prescriptions New Prescriptions   AMOXICILLIN (AMOXIL) 875 MG TABLET    Take 1 tablet (875 mg total) by mouth 2 (two) times daily.   AZITHROMYCIN (ZITHROMAX) 250 MG TABLET    Take 1 tablet (250 mg total) by mouth daily. Take first 2 tablets together, then 1 every day until finished.   PREDNISONE (DELTASONE) 20 MG TABLET    Two daily with food     Controlled Substance Prescriptions [place] Controlled Substance Registry consulted? Not Applicable   Robyn Haber, MD 01/23/17 1406

## 2017-02-01 ENCOUNTER — Ambulatory Visit (INDEPENDENT_AMBULATORY_CARE_PROVIDER_SITE_OTHER): Payer: Medicaid Other | Admitting: Student

## 2017-02-01 ENCOUNTER — Encounter: Payer: Self-pay | Admitting: Student

## 2017-02-01 VITALS — BP 120/85 | HR 92 | Temp 98.3°F | Ht 64.0 in | Wt 145.6 lb

## 2017-02-01 DIAGNOSIS — R05 Cough: Secondary | ICD-10-CM | POA: Diagnosis not present

## 2017-02-01 DIAGNOSIS — K219 Gastro-esophageal reflux disease without esophagitis: Secondary | ICD-10-CM

## 2017-02-01 DIAGNOSIS — M546 Pain in thoracic spine: Secondary | ICD-10-CM

## 2017-02-01 DIAGNOSIS — R059 Cough, unspecified: Secondary | ICD-10-CM

## 2017-02-01 DIAGNOSIS — G4700 Insomnia, unspecified: Secondary | ICD-10-CM | POA: Diagnosis not present

## 2017-02-01 MED ORDER — ZOLPIDEM TARTRATE 5 MG PO TABS: 5.0000 mg | ORAL_TABLET | Freq: Every evening | ORAL | 1 refills | Status: DC | PRN

## 2017-02-01 MED ORDER — PANTOPRAZOLE SODIUM 40 MG PO TBEC: 40.0000 mg | DELAYED_RELEASE_TABLET | Freq: Every day | ORAL | 0 refills | Status: DC

## 2017-02-01 NOTE — Patient Instructions (Signed)
It was great seeing you today! We have addressed the following issues today 1. Acid Reflux: We sent a prescription for Protonix to your pharmacy. Take this medication at bedtime. See below for dietary recommendation. 2.   Issues with sleep: I recommend trying sleep hygiene. Refer to the handouts we gave you for this. You can also try the prescription for Ambien and see if that helps.  If we did any lab work today, and the results require attention, either me or my nurse will get in touch with you. If everything is normal, you will get a letter in mail and a message via . If you don't hear from [place] in two weeks, please give [place] a call. Otherwise, we look forward to seeing you again at your next visit. If you have any questions or concerns before then, please call the clinic at [phone].  Please bring all your medications to every doctors visit  Sign up for My Chart to have easy access to your labs results, and communication with your Primary care physician.    Please check-out at the front desk before leaving the clinic.    Take Care,   Dr. Cyndia Skeeters   Food Choices for Gastroesophageal Reflux Disease, Adult When you have gastroesophageal reflux disease (GERD), the foods you eat and your eating habits are very important. Choosing the right foods can help ease your discomfort. What guidelines do I need to follow?  Choose fruits, vegetables, whole grains, and low-fat dairy products.  Choose low-fat meat, fish, and poultry.  Limit fats such as oils, salad dressings, butter, nuts, and avocado.  Keep a food diary. This helps you identify foods that cause symptoms.  Avoid foods that cause symptoms. These may be different for everyone.  Eat small meals often instead of 3 large meals a day.  Eat your meals slowly, in a place where you are relaxed.  Limit fried foods.  Cook foods using methods other than frying.  Avoid drinking alcohol.  Avoid drinking large amounts of liquids with  your meals.  Avoid bending over or lying down until 2-3 hours after eating. What foods are not recommended? These are some foods and drinks that may make your symptoms worse: Vegetables Tomatoes. Tomato juice. Tomato and spaghetti sauce. Chili peppers. Onion and garlic. Horseradish. Fruits Oranges, grapefruit, and lemon (fruit and juice). Meats High-fat meats, fish, and poultry. This includes hot dogs, ribs, ham, sausage, salami, and bacon. Dairy Whole milk and chocolate milk. Sour cream. Cream. Butter. Ice cream. Cream cheese. Drinks Coffee and tea. Bubbly (carbonated) drinks or energy drinks. Condiments Hot sauce. Barbecue sauce. Sweets/Desserts Chocolate and cocoa. Donuts. Peppermint and spearmint. Fats and Oils High-fat foods. This includes [place] fries and potato chips. Other Vinegar. Strong spices. This includes black pepper, white pepper, red pepper, cayenne, curry powder, cloves, ginger, and chili powder. The items listed above may not be a complete list of foods and drinks to avoid. Contact your dietitian for more information. This information is not intended to replace advice given to you by your health care provider. Make sure you discuss any questions you have with your health care provider. Document Released: 11/23/2011 Document Revised: 10/30/2015 Document Reviewed: 03/28/2013 Elsevier Interactive Patient Education  2017 [employer].

## 2017-02-01 NOTE — Progress Notes (Signed)
Subjective:    [name] Rowe is a 56 y.o. old female here for ED follow up on cough and back pain  HPI  Right flank pain: went to ED for right flank pain about 9 days ago. She had right CVA tenderness on exam. UA with large LE and small Hgb. CMP and CBC within normal. CT renal study, TVUS and abdominopelvic doppler were negative. So, it was assumed that her pain is likely from cough as patient had URI at the same time. So, she was discharged home on Amoxil, Zithromax and tessalon to follow up with PCP. Today, she reports the pain on the left flank with cough and deep breathing. Pain is sharp. No radiation or association with ambulation. Denies swelling or tenderness in her calf. No recent travel. No history of DVT. Not on estrogen therapy. Denies dysuria, hematuria, fever, numbness, tingling or weakness in her legs. Overall, patient reports that the pain is improving.  Cough: Patient was seen in urgent care ED about 9 days ago. Chest x-ray at that time was negative. She was discharged home on amoxicillin, Zithromax and Tessalon. With this, her cough improved. She denies chest pain or shortness of breath. She reports heartburn and said that it feels like solid food is stuck in her chest when she swallows for the last one month.. Denies problem with fluids. Her heartburn is worse with lying down.  PMH/Problem List: has Hypothyroidism; Dyslipidemia; Anxiety; Depression; Migraine headache; RENAL ARTERY STENOSIS; Hypotension; Backache; Fibromyalgia; Status post lumbar surgery; Poorly-controlled hypertension; History of CVA (cerebrovascular accident); Coronary vasospasm ([place]); Dizziness and palpitations, episodic; Fibromuscular dysplasia ([place]); Routine adult health maintenance; Vitamin D insufficiency; Unilateral hearing loss; Vestibular dizziness; Situational anxiety; Intervertebral disc protrusion; Seasonal allergies; HNP (herniated nucleus pulposus), lumbar; Lumbar radicular syndrome; Personal history of  colonic polyps-sessile serrated adenoma; Left-sided weakness; Acute low back pain; Essential hypertension; History of coronary vasospasm; Bursitis, trochanteric; Diplopia; History of hypokalemia; Extremity edema; Lichen sclerosus; Itching; Pain of left breast; Neck pain on left side; Acute left-sided thoracic back pain; Fatigue; ACS (acute coronary syndrome) ([place]); and Poor sleep pattern on her problem list.   has a past medical history of Adenomyosis; Anginal pain ([place]); Anxiety (2001); Chest pain; Chronic back pain; Complication of anesthesia; Coronary vasospasm ([facility]); CVA (cerebral vascular accident) ([place]) (2006); Depression (2001); Dysrhythmia; Fibromuscular dysplasia ([place]) (2009); Fibromyalgia (1999); H/O Graves' disease (2003); Hearing loss of left ear; History of stent insertion of renal artery (2009); HLD (hyperlipidemia) (2000); HTN (hypertension) (1988); echocardiogram; Hypertensive crisis (06/29/2011); Hypothyroidism (2003); Irritable bowel syndrome; Leiomyoma; Migraine headache; Myocardial infarction [facility]) (2009); Personal history of colonic polyps-sessile serrated adenoma (02/23/2013); Renal artery stenosis ([place]); and Tachycardia (2000).  FH:  Family History  Problem Relation Age of Onset  . Hypertension Mother   . Asthma Mother   . Hyperlipidemia Mother   . Stroke Mother   . Hypertension Father   . Prostate cancer Father   . Heart attack Father   . Hyperlipidemia Father   . Stroke Father   . Heart attack Unknown   . Stroke Unknown   . Hypertension Brother   . Heart attack Brother 31  . Early death Brother 69       massive stroke   . Hyperlipidemia Brother   . Metabolic syndrome Sister   . Depression Sister   . Hypertension Sister   . Diabetes Maternal Aunt   . Colon cancer Neg Hx   . Stomach cancer Neg Hx     SH Social History  Substance Use Topics  .  Smoking status: Never Smoker  . Smokeless tobacco: Never Used  . Alcohol use Yes     Comment: rarely    Review of  Systems Review of systems negative except for pertinent positives and negatives in history of present illness above.     Objective:     Vitals:   02/01/17 0852 02/01/17 0915  BP: (!) 144/90 120/85  Pulse: (!) 101 92  Temp: 98.3 F (36.8 C)   TempSrc: Oral   SpO2: 95%   Weight: 145 lb 9.6 oz (66 kg)   Height: 5\' 4"  (1.626 m)     Physical Exam GEN: appears well, no apparent distress. Head: normocephalic and atraumatic  Eyes: conjunctiva without injection, sclera anicteric Oropharynx: mmm without erythema or exudation HEM: negative for cervical or periauricular lymphadenopathies ENDO: negative thyromegally CVS: RRR, nl S1&S2, no murmurs, no edema RESP: no IWOB, good air movement bilaterally, CTAB GI: BS present & normal, soft, NTND GU: no suprapubic or CVA tenderness MSK:  Back:  Normal skin, spine with normal alignment and no deformity.    No step offs, some tenderness to palpation over the thoracolumbar paraspinous muscles on the left.    ROM is full at neck and lumbar sacral regions  Special tests: lying and seated SLR  Neuro exam in LE: motor 5/5 in all muscle groups, light sensation intact in L4-S1 dermatomes, patellar reflexes 2+ bilaterally  SKIN: no apparent skin lesion NEURO: alert and oiented appropriately, no gross deficits  PSYCH: euthymic mood with congruent affect    Depression screen [facility] 2/9 02/01/2017 02/01/2017 12/07/2016 07/29/2016 07/09/2016  Decreased Interest 1 0 0 1 2  Down, Depressed, Hopeless 1 0 0 2 2  PHQ - 2 Score 2 0 0 3 4  Altered sleeping 3 - - - 3  Tired, decreased energy 1 - - - 1  Change in appetite 1 - - - 1  Feeling bad or failure about yourself  0 - - - 0  Trouble concentrating 2 - - - 2  Moving slowly or fidgety/restless 0 - - - 0  Suicidal thoughts 0 - - - 0  PHQ-9 Score 9 - - - 11  Difficult doing work/chores Very difficult - - - -    Assessment and Plan:  1. Acute left-sided thoracic back pain: Likely musculoskeletal from  cough. She had history of muscle spasm in the past. Recent CT scan, TV US and abdominal Doppler 9 days ago without acute finding. Exam is reassuring. Low suspicion for PE. No red flags. Overall, the pain is improving. -Recommended ice or heat 3-4 times a day for 15 minutes  2. Cough: Improved. Likely due to URI. She has completed course of antibiotics she received from ED. Lung exam within normal limits. Recommended a tablespoonful of honey before bedtime and adequate hydration.  3. Insomnia, unspecified type: Patient with depression and anxiety. She is on Cymbalta which could be contributing. Trazodone is not helping.  -Gave her handout about sleep hygiene - zolpidem (AMBIEN) 5 MG tablet; Take 1 tablet (5 mg total) by mouth at bedtime as needed for sleep.  Dispense: 30 tablet; Refill: 1  4. Gastroesophageal reflux disease, esophagitis presence not specified: Small hiatal hernia noted on chest x-ray. I wonder if this is contributing to her sensation of solid food getting stuck when she swallows. She also had history of depression and anxiety.  -We'll try PPI. Patient is on levothyroxine. Advised her to take the PPI at bedtime. We will monitor her thyroid levels  closely - pantoprazole (PROTONIX) 40 MG tablet; Take 1 tablet (40 mg total) by mouth at bedtime.  Dispense: 90 tablet; Refill: 0 - Discussed about dietary changes as well   Return in about 2 months (around 04/03/2017) for Thyroid/PPI.  [name], MD 02/02/17 Pager: [phone]

## 2017-03-24 ENCOUNTER — Other Ambulatory Visit: Payer: Self-pay | Admitting: Student

## 2017-03-24 DIAGNOSIS — Z1231 Encounter for screening mammogram for malignant neoplasm of breast: Secondary | ICD-10-CM

## 2017-03-31 ENCOUNTER — Ambulatory Visit
Admission: RE | Admit: 2017-03-31 | Discharge: 2017-03-31 | Disposition: A | Discharge status: Home / Self Care | Payer: Medicaid Other | Source: Ambulatory Visit | Attending: Family Medicine | Admitting: Family Medicine

## 2017-03-31 DIAGNOSIS — Z1231 Encounter for screening mammogram for malignant neoplasm of breast: Secondary | ICD-10-CM

## 2017-04-05 ENCOUNTER — Encounter: Payer: Self-pay | Admitting: Student

## 2017-04-05 DIAGNOSIS — R000 Tachycardia, unspecified: Secondary | ICD-10-CM | POA: Insufficient documentation

## 2017-04-06 ENCOUNTER — Encounter: Payer: Self-pay | Admitting: Student

## 2017-04-06 ENCOUNTER — Ambulatory Visit (INDEPENDENT_AMBULATORY_CARE_PROVIDER_SITE_OTHER): Payer: Medicaid Other | Admitting: Student

## 2017-04-06 VITALS — BP 140/110 | HR 92 | Temp 98.1°F | Ht 64.0 in | Wt 146.2 lb

## 2017-04-06 DIAGNOSIS — G43009 Migraine without aura, not intractable, without status migrainosus: Secondary | ICD-10-CM | POA: Diagnosis present

## 2017-04-06 DIAGNOSIS — E038 Other specified hypothyroidism: Secondary | ICD-10-CM | POA: Diagnosis not present

## 2017-04-06 DIAGNOSIS — F329 Major depressive disorder, single episode, unspecified: Secondary | ICD-10-CM

## 2017-04-06 DIAGNOSIS — Z23 Encounter for immunization: Secondary | ICD-10-CM | POA: Diagnosis not present

## 2017-04-06 DIAGNOSIS — I10 Essential (primary) hypertension: Secondary | ICD-10-CM | POA: Diagnosis not present

## 2017-04-06 DIAGNOSIS — F32A Depression, unspecified: Secondary | ICD-10-CM

## 2017-04-06 MED ORDER — CARVEDILOL 6.25 MG PO TABS: 6.2500 mg | ORAL_TABLET | Freq: Two times a day (BID) | ORAL | 3 refills | Status: DC

## 2017-04-06 MED ORDER — SERTRALINE HCL 100 MG PO TABS: 100.0000 mg | ORAL_TABLET | Freq: Every day | ORAL | 3 refills | Status: DC

## 2017-04-06 NOTE — Progress Notes (Signed)
Subjective:    [name] Rowe is a 56 y.o. old female here with headache  HPI Headache: this has been going on for 4 weeks. Hurts in left eye which is new. She describes the headache as pounding. On and off. Happens two to three times a week. Lasts 5-6 hours. She feels sick in her stomach but never threw up. Headache is usually behind her left eye. No photophobia or phonophobia. Reports vision changes for 6-7 months. She has history of migraine headache. This is different from her migraine because usually her eye don't heart with her migraine.  Normally uses extra strength Tylenol that helped a little bit. No aggravating factor.  Denies acute vision change, numbness, tingling, focal weakness or fever  Hypothyroidism: She is on levothyroxine 125 mcg daily.  She endorses fatigue and some heat intolerance. She denies palpitation.   Hypertension: Reports taking Coreg 3.125 mg twice daily although it is listed at 12.5 mg twice daily in a chart.  She says she was on 12.5 mg twice daily in the past but could not tolerate due to excessive fatigue.  So she went back to her old dose which was 3.125 mg twice daily. She does not watch his salt intake but reports trying to eat healthy.  She does not check her blood pressure at home.  She also states that she was taken off Cymbalta and started on Zoloft 100 mg daily.   PMH/Problem List: has Hypothyroidism; Dyslipidemia; Anxiety; Depression; Migraine headache; RENAL ARTERY STENOSIS; Backache; Fibromyalgia; Status post lumbar surgery; Poorly-controlled hypertension; History of CVA (cerebrovascular accident); Dizziness and palpitations, episodic; Fibromuscular dysplasia ([place]); Routine adult health maintenance; Vitamin D insufficiency; Unilateral hearing loss; Vestibular dizziness; Situational anxiety; Intervertebral disc protrusion; Seasonal allergies; HNP (herniated nucleus pulposus), lumbar; Lumbar radicular syndrome; Personal history of colonic polyps-sessile serrated  adenoma; Left-sided weakness; Acute low back pain; Essential hypertension; History of coronary vasospasm; Bursitis, trochanteric; Diplopia; Extremity edema; Lichen sclerosus; Pain of left breast; Neck pain on left side; Acute left-sided thoracic back pain; Fatigue; Poor sleep pattern; and Tachycardia on her problem list.   has a past medical history of Adenomyosis; Anginal pain ([place]); Anxiety (2001); Chest pain; Chronic back pain; Complication of anesthesia; Coronary vasospasm (H[facility]); CVA (cerebral vascular accident) ([place]) (2006); Depression (2001); Dysrhythmia; Fibromuscular dysplasia ([place]) (2009); Fibromyalgia (1999); H/O Graves' disease (2003); Hearing loss of left ear; History of stent insertion of renal artery (2009); HLD (hyperlipidemia) (2000); HTN (hypertension) (1988); echocardiogram; Hypertensive crisis (06/29/2011); Hypothyroidism (2003); Irritable bowel syndrome; Leiomyoma; Migraine headache; Myocardial infarction [facility]) (2009); Personal history of colonic polyps-sessile serrated adenoma (02/23/2013); Renal artery stenosis ([place]); and Tachycardia (2000).  FH:  Family History  Problem Relation Age of Onset  . Hypertension Mother   . Asthma Mother   . Hyperlipidemia Mother   . Stroke Mother   . Hypertension Father   . Prostate cancer Father   . Heart attack Father   . Hyperlipidemia Father   . Stroke Father   . Heart attack Unknown   . Stroke Unknown   . Hypertension Brother   . Heart attack Brother 3  . Early death Brother 21       massive stroke   . Hyperlipidemia Brother   . Metabolic syndrome Sister   . Depression Sister   . Hypertension Sister   . Diabetes Maternal Aunt   . Colon cancer Neg Hx   . Stomach cancer Neg Hx     SH Social History  Substance Use Topics  . Smoking status: Never Smoker  .  Smokeless tobacco: Never Used  . Alcohol use Yes     Comment: rarely    Review of Systems Review of systems negative except for pertinent positives and negatives in  history of present illness above.     Objective:     Vitals:   04/06/17 1331 04/06/17 1345  BP: (!) 150/110 (!) 140/110  Pulse: 92   Temp: 98.1 F (36.7 C)   TempSrc: Oral   SpO2: 99%   Weight: 146 lb 3.2 oz (66.3 kg)   Height: 5\' 4"  (1.626 m)    Body mass index is 25.1 kg/m.  Physical Exam GEN: appears well, no apparent distress. Head: normocephalic and atraumatic.  No tenderness to palpation over temporal areas.  Eyes: PERRL, EOMI Oropharynx: mmm without erythema or exudation HEM: negative for cervical or periauricular lymphadenopathies CVS: Tachycardic to 96, RR, nl S1&S2, no murmurs, no edema RESP: no IWOB, good air movement bilaterally, CTAB NEURO: alert and oiented appropriately, no gross deficits  PSYCH: euthymic mood with congruent affect  Assessment and Plan:  1. Headache: patient with a history of migraine.  She has unilateral headache with nausea which may suggest migraine but not disabling to suggest migraine headache. She has no tenderness to palpation over the temporal areas to think of temporal arteritis.  I also worry that the underlying mood issue might have some roles here.  Unfortunately, patient is not a candidate for NSAIDs due to history of renal stenosis.  She is also not a candidate for Imitrex due to history of palpitation with this medication.  -With increased pain carvedilol to 6.25 mg twice daily which could also help with controlling her blood pressure -She can try extra strength Tylenol. -I also suggested lifestyle changes including exercise & sleep hygiene.  I offered her [facility].  However, patient prefers to call back another time  2. Essential hypertension: Blood pressure elevated to 140/110 bilaterally.  She says she takes Coreg 3.125 mg twice daily.  She had history of paroxysmal tachycardia.  She does not watch her salt intake. -Increase Coreg to 6.25 mg twice daily. -Discussed lifestyle change including daily exercise, diet and watching his salt  intake -Basic metabolic panel  3. Hypothyroidism: On levothyroxine 125 mcg daily.  Reports good compliance with this medication.  - TSH  4. Need for immunization against influenza - Flu Vaccine QUAD 36+ mos IM  Return in about 1 month (around 05/06/2017) for HTN, Physical.  [name], MD 04/06/17 Pager: [phone]

## 2017-04-06 NOTE — Patient Instructions (Addendum)
It was great seeing you today! We have addressed the following issues today  1. Headache: I suggested lifestyle changes including exercise.  You can also try extra strength Tylenol.  Unfortunately, medications like ibuprofen, Advil or Aleve and not a good due to your kidney issues and blood pressure.  Increasing the blood pressure medicine might help with your headache if this is migraine but I doubt.  I also suggested the number we gave you to talk to our behavioral health clinician.  2. Thyroid issue: We are checking the blood today 3. Blood pressure: Blood pressure is 140/110 today.  Your goal blood pressure is less than 130/80.  We have increased your carvedilol to 6.25 mg twice a day.  Will recommend follow-up in 1 month.   If we did any lab work today, and the results require attention, either me or my nurse will get in touch with you. If everything is normal, you will get a letter in mail and a message via . If you don't hear from [place] in two weeks, please give [place] a call. Otherwise, we look forward to seeing you again at your next visit. If you have any questions or concerns before then, please call the clinic at [phone].  Please bring all your medications to every doctors visit  Sign up for My Chart to have easy access to your labs results, and communication with your Primary care physician.    Please check-out at the front desk before leaving the clinic.    Take Care,   Dr. Cyndia Skeeters   Tension Headache A tension headache is pain, pressure, or aching that is felt over the front and sides of your head. These headaches can last from 30 minutes to several days. Follow these instructions at home: Managing pain  Take over-the-counter and prescription medicines only as told by your doctor.  Lie down in a dark, quiet room when you have a headache.  If directed, apply ice to your head and neck area: ? Put ice in a plastic bag. ? Place a towel between your skin and the bag. ? Leave  the ice on for 20 minutes, 2-3 times per day.  Use a heating pad or a hot shower to apply heat to your head and neck area as told by your doctor. Eating and drinking  Eat meals on a regular schedule.  Do not drink a lot of alcohol.  Do not use a lot of caffeine, or stop using caffeine. General instructions  Keep all follow-up visits as told by your doctor. This is important.  Keep a journal to find out if certain things bring on headaches. For example, write down: ? What you eat and drink. ? How much sleep you get. ? Any change to your diet or medicines.  Try getting a massage, or doing other things that help you to relax.  Lessen stress.  Sit up straight. Do not tighten (tense) your muscles.  Do not use tobacco products. This includes cigarettes, chewing tobacco, or e-cigarettes. If you need help quitting, ask your doctor.  Exercise regularly as told by your doctor.  Get enough sleep. This may mean 7-9 hours of sleep. Contact a doctor if:  Your symptoms are not helped by medicine.  You have a headache that feels different from your usual headache.  You feel sick to your stomach (nauseous) or you throw up (vomit).  You have a fever. Get help right away if:  Your headache becomes very bad.  You keep  throwing up.  You have a stiff neck.  You have trouble seeing.  You have trouble speaking.  You have pain in your eye or ear.  Your muscles are weak or you lose muscle control.  You lose your balance or you have trouble walking.  You feel like you will pass out (faint) or you pass out.  You have confusion. This information is not intended to replace advice given to you by your health care provider. Make sure you discuss any questions you have with your health care provider. Document Released: 08/18/2009 Document Revised: 01/22/2016 Document Reviewed: 09/16/2014 Elsevier Interactive Patient Education  [employer].

## 2017-04-07 LAB — BASIC METABOLIC PANEL
BUN/Creatinine Ratio: 12 (ref 9–23)
BUN: 14 mg/dL (ref 6–24)
CALCIUM: 9.7 mg/dL (ref 8.7–10.2)
CO2: 23 mmol/L (ref 20–29)
CREATININE: 1.18 mg/dL — AB (ref 0.57–1.00)
Chloride: 101 mmol/L (ref 96–106)
GFR calc Af Amer: 60 mL/min/{1.73_m2} (ref >59)
GFR calc non Af Amer: 52 mL/min/{1.73_m2} — ABNORMAL LOW (ref >59)
GLUCOSE: 80 mg/dL (ref 65–99)
Potassium: 4.8 mmol/L (ref 3.5–5.2)
SODIUM: 141 mmol/L (ref 134–144)

## 2017-04-07 LAB — TSH: TSH: 36.85 u[IU]/mL — ABNORMAL HIGH (ref 0.450–4.500)

## 2017-04-11 ENCOUNTER — Telehealth: Payer: Self-pay | Admitting: Student

## 2017-04-11 DIAGNOSIS — E038 Other specified hypothyroidism: Secondary | ICD-10-CM

## 2017-04-11 MED ORDER — LEVOTHYROXINE SODIUM 150 MCG PO TABS: 150.0000 ug | ORAL_TABLET | Freq: Every day | ORAL | 0 refills | Status: DC

## 2017-04-11 NOTE — Telephone Encounter (Signed)
TSH very high to 38 despite taking his Synthroid.  Increased Synthroid dose to 150 mcg.  She already have a follow-up with me in 3 weeks.  We will recheck TSH at that time.

## 2017-04-15 ENCOUNTER — Other Ambulatory Visit: Payer: Self-pay | Admitting: Neurosurgery

## 2017-04-15 DIAGNOSIS — M5442 Lumbago with sciatica, left side: Secondary | ICD-10-CM

## 2017-04-19 NOTE — Progress Notes (Signed)
Cardiology Office Note   Date:  22/97/9892   ID:  [name] Rowe, DOB [birth year], MRN [id number]  PCP:  [name], MD  Cardiologist:   Skeet Latch, MD   Chief Complaint  Patient presents with  . Follow-up    6 momths;      History of Present Illness: [name] Rowe is a 56 y.o. female with prior stroke, hypertension, hyperlipidemia,fibromuscular dysplasia, and coronary vasospasm who presents for follow up.  [name] Rowe was previously a patient of Dr. Aundra Dubin.  She had a stent placed in 2009 for renal artery stenosis/fibromuscular dysplasia.  She underwent a LHC at that time in [place] that showed catheter-induced right coronary spasm.  In 01/2015 she had another cath that reportedly showed 40-50% LAD disease.  She has been evaluated by her PCP and Dr. Aundra Dubin for atypical chest pain.  Cardiac enzymes and EKG have been unremarkable.  She saw Kerin Ransom 06/2016 and her blood pressure was poorly-controlled.  Nifedipine was switched to diltiazem due to palpitaitons.  She then saw him 10/2016 and reported awakening gasping for sleep.  She was referred for a sleep study but hasn't yet completed it due to travel.  She was recently visiting her sister who noted that it sounds like someone is trying to choke her when she sleeps.  She stops breathing and gasps for air.   [name] Rowe notes that her blood pressure has been elevated lately.  It has ranged from 120s to the 180s.  She also reports intermittent episodes of palpitations.  These typically occurred at rest.  She had one episode that occurred while driving.  She had a weird feeling in her chest that caused her to pull over.  She noted pressure and felt dizzy.  This lasted for approximately 2 minutes and stopped suddenly.  These episodes occur she feels like her heart is pounding.  She previously wore a 48-hour Holter that showed episodes of sinus tachycardia up to the 130s or 140s.  She reports that these episodes occurred at rest, as  she does not get much exercise due to back pain.  She previously tried increasing carvedilol to 12.5 mg but had headaches.  She continues to work with her primary care provider to adjust her levothyroxine.  Her TSH was recently elevated and the dose was increased last week.  She denies lower extremity edema, orthopnea, or PND.   Past Medical History:  Diagnosis Date  . Adenomyosis   . Anginal pain ([place])   . Anxiety 2001  . Chest pain    a. No angiographic CAD by cath 2009 in [place]. There was catheter-induced RCA vasospasm versus microvascular obstruction. bCarlton Adam myoview 04/2010 - EF 79%, normal perfusion.;  c. ETT-Myoview 7/13: no ischemia, EF 77%, submax exercise  . Chronic back pain    had 3 epidural injections on 02-14-13  . Complication of anesthesia    has difficulty voiding after surgery  . Coronary vasospasm ([place])   . CVA (cerebral vascular accident) ([place]) 2006   lost hearing on left  . Depression 2001  . Dysrhythmia   . Fibromuscular dysplasia ([place]) 2009   a. Renal artery stenosis due to this. b. Carotid dopplers 07/2011 c/w FMD but no significant stenosis  . Fibromyalgia 1999  . H/O Graves' disease 2003   s/p radio-iodine treatment in 2003   . Hearing loss of left ear    s/p CVA 2006  . History of stent insertion of renal artery 2009   right  renal stent  . HLD (hyperlipidemia) 2000  . HTN (hypertension) 1988   a. Likely related to RAS; urinary metanephrines and plasma renin/aldosterone ratio normal. b. H/o HTN urgency 06/2011 after being off BP meds for a number of days  . Hx of echocardiogram    a. Echo (1/13): EF 78-29%, grade 1 diastolic dysfunction  . Hypertensive crisis 06/29/2011  . Hypothyroidism 2003   Graves disease s/p radio-iodine treatment in 2003   . Irritable bowel syndrome   . Leiomyoma   . Migraine headache   . Myocardial infarction ([place]) 2009  . Personal history of colonic polyps-sessile serrated adenoma 02/23/2013  . Renal artery stenosis  ([place])    a. Due to fibromuscular dysplasia. b. Renal stent 2009. c. Studies:  CTA abdomen (1/13) with evidence for fibromuscular dysplasia, right renal artery stent appears patent. Renal artery dopplers (2/13) with FMD but no evidence for signficant stenosis.   . Tachycardia 2000   10/27/10-Holter -NSR with occ sinus tachy-rare PACs, no sig arrhythmia    Past Surgical History:  Procedure Laterality Date  . BACK SURGERY  2010 and 2011   In [place].  11/19/2008 (possibly a laminectomy) and in May of 2011 she underwent a L2-L3 fusion.  . cardiac cath    . CARDIAC CATHETERIZATION  2009   states it was normal  . COLONOSCOPY    . COLONOSCOPY    . HIP SURGERY Bilateral 1988 and 1989   "scraped head of femur"  . KNEE ARTHROSCOPY Left   . RENAL ARTERY STENT  2009  . ROBOTIC ASSISTED LAP VAGINAL HYSTERECTOMY  05/13/2008   [facility] hysterectomy  for myomatous uterus   . SPINE SURGERY    . VAGINAL HYSTERECTOMY       Current Outpatient Medications  Medication Sig Dispense Refill  . aspirin EC 81 MG tablet Take 81 mg by mouth daily.    Marland Kitchen diltiazem (DILACOR XR) 180 MG 24 hr capsule Take 2 capsules (360 mg total) by mouth daily. (Patient taking differently: Take 180 mg by mouth daily. ) 60 capsule 0  . levothyroxine (SYNTHROID, LEVOTHROID) 150 MCG tablet Take 1 tablet (150 mcg total) daily by mouth. 90 tablet 0  . linaclotide (LINZESS) 290 MCG CAPS capsule Take 1 capsule (290 mcg total) by mouth daily. 30 capsule 11  . nitroGLYCERIN (NITROSTAT) 0.4 MG SL tablet Place 1 tablet (0.4 mg total) under the tongue every 5 (five) minutes as needed. x3 doses as needed for chest pain 100 tablet 3  . QUEtiapine (SEROQUEL) 200 MG tablet Take by mouth.    . rosuvastatin (CRESTOR) 40 MG tablet Take 1 tablet (40 mg total) by mouth daily. 30 tablet 0  . sertraline (ZOLOFT) 100 MG tablet Take 1 tablet (100 mg total) by mouth daily. 30 tablet 3  . traZODone (DESYREL) 150 MG tablet Take 150 mg by mouth at bedtime.      Marland Kitchen amLODipine (NORVASC) 2.5 MG tablet Take 1 tablet (2.5 mg total) daily by mouth. 30 tablet 5  . metoprolol succinate (TOPROL XL) 50 MG 24 hr tablet Take 1 tablet (50 mg total) at bedtime by mouth. Take with or immediately following a meal. 30 tablet 5   No current facility-administered medications for this visit.     Allergies:   Hydromorphone hcl; Prochlorperazine edisylate; Sumatriptan; Atorvastatin; and Tramadol    Social History:  The patient  reports that  has never smoked. she has never used smokeless tobacco. She reports that she drinks alcohol. She reports  that she does not use drugs.   Family History:  The patient's family history includes Asthma in her mother; Depression in her sister; Diabetes in her maternal aunt; Early death (age of onset: 68) in her brother; Heart attack in her father and unknown relative; Heart attack (age of onset: 24) in her brother; Hyperlipidemia in her brother, father, and mother; Hypertension in her brother, father, mother, and sister; Metabolic syndrome in her sister; Prostate cancer in her father; Stroke in her father, mother, and unknown relative.    ROS:  Please see the history of present illness.   Otherwise, review of systems are positive for cramps in toes.   All other systems are reviewed and negative.    PHYSICAL EXAM: VS:  BP (!) 144/93   Pulse 100   Ht 5\' 4"  (1.626 m)   Wt 66.8 kg (147 lb 3.2 oz)   BMI 25.27 kg/m  , BMI Body mass index is 25.27 kg/m. GENERAL:  Well appearing HEENT:  Pupils equal round and reactive, fundi not visualized, oral mucosa unremarkable NECK:  No jugular venous distention, waveform within normal limits, carotid upstroke brisk and symmetric, no bruits, no thyromegaly LUNGS:  Clear to auscultation bilaterally HEART:  RRR.  PMI not displaced or sustained,S1 and S2 within normal limits, no S3, no S4, no clicks, no rubs, no murmurs ABD:  Flat, positive bowel sounds normal in frequency in pitch, no bruits, no  rebound, no guarding, no midline pulsatile mass, no hepatomegaly, no splenomegaly EXT:  2 plus pulses throughout, no edema, no cyanosis no clubbing SKIN:  No rashes no nodules NEURO:  Cranial nerves II through XII grossly intact, motor grossly intact throughout PSYCH:  Cognitively intact, oriented to person place and time    EKG:  EKG is ordered today. The ekg ordered today demonstrates sinus rhythm.  Rate 100 bpm.  QTc 479 ms.    Recent Labs: 08/30/2016: B Natriuretic Peptide 14.9 01/23/2017: ALT 36; Hemoglobin 13.8; Platelets 265 04/06/2017: BUN 14; Creatinine, Ser 1.18; Potassium 4.8; Sodium 141; TSH 36.850    Lipid Panel    Component Value Date/Time   CHOL 268 (H) 08/30/2016 0858   TRIG 202 (H) 08/30/2016 0858   HDL 46 08/30/2016 0858   CHOLHDL 5.8 08/30/2016 0858   VLDL 40 08/30/2016 0858   LDLCALC 182 (H) 08/30/2016 0858   LDLDIRECT 147.4 06/12/2013 0943      Wt Readings from Last 3 Encounters:  04/20/17 66.8 kg (147 lb 3.2 oz)  04/06/17 66.3 kg (146 lb 3.2 oz)  02/01/17 66 kg (145 lb 9.6 oz)      ASSESSMENT AND PLAN:  # Sinus tachycardia: [name] Rowe has episodes of palpitations and lightheadedness that may be related to inappropriate sinus tachycardia.  Her thyroid has been abnormal, but she is hypothyroid making it unlikely that this is the cause of her tachycardia.  We will stop carvedilol and start metoprolol succinate 50mg  qhs.  Continue diltiazem.   # Hypertension:  # Renal artery stenosis:  Blood pressure is above goal.  Given that we are switching metoprolol for carvedilol we will also add amlodipine for improved blood pressure control.  Continue diltiazem.    # Non-obstructive CAD: # Coronary spasm: Continue aspirin, rosuvastatin and beta blocker.   # Hyperlipidemia: Continue rosuvastatin.  # Apnea: Sleep study referral   Current medicines are reviewed at length with the patient today.  The patient does not have concerns regarding medicines.  The  following changes have been made: Stop carvedilol.  Start metoprolol and amlodipine.  Labs/ tests ordered today include: He  Orders Placed This Encounter  Procedures  . EKG 12-Lead  . Split night study     Disposition:   FU with [name] Marcel C. Oval Linsey, MD, [facility] in 1 month    This note was written with the assistance of speech recognition software.  Please excuse any transcriptional errors.  Signed, Mychael Smock C. Oval Linsey, MD, [facility]  04/20/2017 6:56 PM    [facility]

## 2017-04-20 ENCOUNTER — Ambulatory Visit: Payer: Medicaid Other | Admitting: Cardiovascular Disease

## 2017-04-20 ENCOUNTER — Encounter: Payer: Self-pay | Admitting: Cardiovascular Disease

## 2017-04-20 VITALS — BP 144/93 | HR 100 | Ht 64.0 in | Wt 147.2 lb

## 2017-04-20 DIAGNOSIS — R000 Tachycardia, unspecified: Secondary | ICD-10-CM | POA: Diagnosis not present

## 2017-04-20 DIAGNOSIS — R0683 Snoring: Secondary | ICD-10-CM

## 2017-04-20 DIAGNOSIS — R0681 Apnea, not elsewhere classified: Secondary | ICD-10-CM | POA: Diagnosis not present

## 2017-04-20 DIAGNOSIS — I10 Essential (primary) hypertension: Secondary | ICD-10-CM

## 2017-04-20 DIAGNOSIS — R002 Palpitations: Secondary | ICD-10-CM

## 2017-04-20 DIAGNOSIS — I2510 Atherosclerotic heart disease of native coronary artery without angina pectoris: Secondary | ICD-10-CM

## 2017-04-20 DIAGNOSIS — E782 Mixed hyperlipidemia: Secondary | ICD-10-CM

## 2017-04-20 MED ORDER — METOPROLOL SUCCINATE ER 50 MG PO TB24: 50.0000 mg | ORAL_TABLET | Freq: Every day | ORAL | 5 refills | Status: DC

## 2017-04-20 MED ORDER — AMLODIPINE BESYLATE 2.5 MG PO TABS: 2.5000 mg | ORAL_TABLET | Freq: Every day | ORAL | 5 refills | Status: DC

## 2017-04-20 NOTE — Patient Instructions (Signed)
Medication Instructions:  STOP CARVEDILOL  START METOPROLOL SUC 50 MG AT BEDTIME  START AMLODIPINE 2.5 MG DAILY   Labwork: NONE  Testing/Procedures: Your physician has recommended that you have a sleep study. This test records several body functions during sleep, including: brain activity, eye movement, oxygen and carbon dioxide blood levels, heart rate and rhythm, breathing rate and rhythm, the flow of air through your mouth and nose, snoring, body muscle movements, and chest and belly movement. WILL CALL YOU WITH APPOINTMENT DATE AND TIME. IF YOU DO NOT HEAR FROM THE OFFICE BY MID NEXT WEEK CALL THE OFFICE [phone]  Follow-Up: Your physician recommends that you schedule a follow-up appointment in: [place]  If you need a refill on your cardiac medications before your next appointment, please call your pharmacy.

## 2017-04-26 ENCOUNTER — Ambulatory Visit
Admission: RE | Admit: 2017-04-26 | Discharge: 2017-04-26 | Disposition: A | Discharge status: Home / Self Care | Payer: Medicaid Other | Source: Ambulatory Visit | Attending: Neurosurgery | Admitting: Neurosurgery

## 2017-04-26 DIAGNOSIS — M5442 Lumbago with sciatica, left side: Secondary | ICD-10-CM

## 2017-04-26 MED ORDER — GADOBENATE DIMEGLUMINE 529 MG/ML IV SOLN
13.0000 mL | Freq: Once | INTRAVENOUS | Status: AC | PRN
  Administered 2017-04-26: 13 mL via INTRAVENOUS

## 2017-05-04 ENCOUNTER — Ambulatory Visit: Payer: Medicaid Other | Admitting: Student

## 2017-05-04 ENCOUNTER — Encounter: Payer: Self-pay | Admitting: Student

## 2017-05-04 ENCOUNTER — Other Ambulatory Visit: Payer: Self-pay

## 2017-05-04 ENCOUNTER — Encounter: Payer: Self-pay | Admitting: Licensed Clinical Social Worker

## 2017-05-04 VITALS — BP 118/76 | HR 93 | Temp 98.4°F | Ht 64.0 in | Wt 145.4 lb

## 2017-05-04 DIAGNOSIS — M542 Cervicalgia: Secondary | ICD-10-CM | POA: Diagnosis not present

## 2017-05-04 DIAGNOSIS — E559 Vitamin D deficiency, unspecified: Secondary | ICD-10-CM | POA: Diagnosis not present

## 2017-05-04 DIAGNOSIS — R7989 Other specified abnormal findings of blood chemistry: Secondary | ICD-10-CM

## 2017-05-04 DIAGNOSIS — Z9189 Other specified personal risk factors, not elsewhere classified: Secondary | ICD-10-CM | POA: Insufficient documentation

## 2017-05-04 DIAGNOSIS — R1310 Dysphagia, unspecified: Secondary | ICD-10-CM

## 2017-05-04 DIAGNOSIS — M79601 Pain in right arm: Secondary | ICD-10-CM

## 2017-05-04 DIAGNOSIS — Z0000 Encounter for general adult medical examination without abnormal findings: Secondary | ICD-10-CM | POA: Diagnosis not present

## 2017-05-04 MED ORDER — BACLOFEN 10 MG PO TABS: 10.0000 mg | ORAL_TABLET | Freq: Three times a day (TID) | ORAL | 0 refills | Status: DC

## 2017-05-04 NOTE — Progress Notes (Signed)
Subjective:   Chief Complaint  Patient presents with  . Annual Exam   HPI [name] Rowe is a 56 y.o. old female here  for annual exam.  Concern today: Neck, shoulder and right arm pain: these have been going for 4 months. Fell off stair on the right side. Pain is from her neck to her right upper arm. Pain in her right arm when she tried to pull over her blanket. Neck pain is on both side. Pain is always there. Pain 9/10. Haven't tried any medicine. Was seen by neurosurgeon yesterday but didn't discuss about her neck. She says they just talked about her back and ignored about her neck. She says they are planning to send her to pain management. Reports numbness in her left hand. Denies weakness in her arms.  No symptoms in her legs.   Changes in his/her health in the last 12 months: no Occupation: Psychologist, occupational with church Wears seatbelt: yes.    The patient has regular exercise: no because she doesn't have the motivation Enough vegetables and fruits: no. May be three times a week Smokes cigarette: no Drinks EtOH: very rarely Drug use: no Patient takes ASA: yes.  Patient takes vitD & Ca: no. Ever been transfused or tattooed?: no.  The patient is not sexually active.  Patient uses birth control: no.  Domestic violence: no.  Advance directive: yes. MOST: no.   History of depression:yes.  Patient dental home: no.  Immunizations  Needs influenza vaccine: no.  Needs HPV (Women until age 6): not applicable.  Needs Tdap: no.  Needs Pneumococcal: no.  Screening Need colon cancer screening: no. Need breast cancer ccreening: no. Need cervical cancer Screening: no. STOP BANG >/=3 for OSA: yes Need lung cancer screening:no. At risk for skin cancer: no. Need HCV Screening: no. Need STI Screening: no.  PMH/Problem List: has Hypothyroidism; Dyslipidemia; Anxiety; Depression; Migraine headache; RENAL ARTERY STENOSIS; Backache; Fibromyalgia; Status post lumbar surgery; Poorly-controlled  hypertension; History of CVA (cerebrovascular accident); Dizziness and palpitations, episodic; Fibromuscular dysplasia ([place]); Routine adult health maintenance; Vitamin D insufficiency; Unilateral hearing loss; Vestibular dizziness; Situational anxiety; Intervertebral disc protrusion; Seasonal allergies; HNP (herniated nucleus pulposus), lumbar; Lumbar radicular syndrome; Personal history of colonic polyps-sessile serrated adenoma; Left-sided weakness; Acute low back pain; Essential hypertension; History of coronary vasospasm; Bursitis, trochanteric; Diplopia; Extremity edema; Lichen sclerosus; Pain of left breast; Neck pain on left side; Acute left-sided thoracic back pain; Fatigue; Poor sleep pattern; and Tachycardia on their problem list.   has a past medical history of Adenomyosis, Anginal pain ([place]), Anxiety (2001), Chest pain, Chronic back pain, Complication of anesthesia, Coronary vasospasm ([place]), CVA (cerebral vascular accident) ([place]) (2006), Depression (2001), Dysrhythmia, Fibromuscular dysplasia ([place]) (2009), Fibromyalgia (1999), H/O Graves' disease (2003), Hearing loss of left ear, History of stent insertion of renal artery (2009), HLD (hyperlipidemia) (2000), HTN (hypertension) (1988), echocardiogram, Hypertensive crisis (06/29/2011), Hypothyroidism (2003), Irritable bowel syndrome, Leiomyoma, Migraine headache, Myocardial infarction ([place]) (2009), Personal history of colonic polyps-sessile serrated adenoma (02/23/2013), Renal artery stenosis ([place]), and Tachycardia (2000).  [facility]  Family History  Problem Relation Age of Onset  . Hypertension Mother   . Asthma Mother   . Hyperlipidemia Mother   . Stroke Mother   . Hypertension Father   . Prostate cancer Father   . Heart attack Father   . Hyperlipidemia Father   . Stroke Father   . Heart attack Unknown   . Stroke Unknown   . Hypertension Brother   . Heart attack Brother 22  .  Early death Brother 85       massive stroke   . Hyperlipidemia  Brother   . Metabolic syndrome Sister   . Depression Sister   . Hypertension Sister   . Diabetes Maternal Aunt   . Colon cancer Neg Hx   . Stomach cancer Neg Hx    Family history of heart disease before age of 3 yrs: yes. Mother when she was 35 years of age after brain aneyrusm rupture Family history of stroke: brother passed away at age of 87 years Family history of cancer: sister with thyroid cancer SH Social History   Tobacco Use  . Smoking status: Never Smoker  . Smokeless tobacco: Never Used  Substance Use Topics  . Alcohol use: Yes    Comment: rarely  . Drug use: No   Review of Systems  Constitutional: Positive for fatigue. Negative for appetite change, diaphoresis, fever and unexpected weight change.  HENT: Negative for dental problem and trouble swallowing.   Eyes: Negative for visual disturbance.  Respiratory: Negative for apnea, cough, chest tightness and shortness of breath.   Cardiovascular: Positive for palpitations. Negative for chest pain and leg swelling.  Gastrointestinal: Negative for abdominal pain, blood in stool and diarrhea.  Endocrine: Positive for heat intolerance. Negative for cold intolerance, polydipsia, polyphagia and polyuria.  Genitourinary: Negative for dysuria, hematuria, menstrual problem and vaginal discharge.  Musculoskeletal: Positive for arthralgias and neck pain. Negative for myalgias.  Skin: Negative for rash.  Neurological: Negative for dizziness and light-headedness.  Hematological: Negative for adenopathy. Does not bruise/bleed easily.  Psychiatric/Behavioral: Positive for dysphoric mood. Negative for suicidal ideas.       Objective:   Physical Exam Vitals:   05/04/17 0833  BP: 118/76  Pulse: 93  Temp: 98.4 F (36.9 C)  TempSrc: Oral  SpO2: 96%  Weight: 145 lb 6.4 oz (66 kg)  Height: 5\' 4"  (1.626 m)   Body mass index is 24.96 kg/m.  GEN: appears well, no apparent distress. Head: normocephalic and atraumatic  Eyes:  conjunctiva without injection, sclera anicteric Ears: external ear and ear canal normal Nares: no rhinorrhea, congestion or erythema Oropharynx: mmm without erythema or exudation HEM: negative for cervical or periauricular lymphadenopathies CVS: RRR, nl s1 & s2, no murmurs, no edema RESP: no IWOB, good air movement bilaterally, CTAB GI: BS present & normal, soft, NTND, no guarding, no rebound, no mass GU: no suprapubic or CVA tenderness MSK: Full range of motion in her neck, shoulders and elbows.  No apparent swelling or deformity noted.  She is tender to palpation mid way over here right humerus posteriorly.  Also pain with resisted extension of her elbow over the same area.  Motor 5/5 in both upper extremities but painful with extension of the elbow.  Light sensation intact.  Biceps reflex 2+ bilaterally.  Spurling test negative but elicits pain in her neck that does not radiate to his shoulder or arms.  SKIN: no apparent skin lesion ENDO: negative thyromegally NEURO: alert and oiented appropriately, no gross deficits  PSYCH: Flat affect  Depression screen [facility] 2/9 05/04/2017 04/06/2017 02/01/2017 02/01/2017 12/07/2016  Decreased Interest 1 1 1  0 0  Down, Depressed, Hopeless 3 1 1  0 0  PHQ - 2 Score 4 2 2  0 0  Altered sleeping 3 3 3  - -  Tired, decreased energy 2 3 1  - -  Change in appetite 1 0 1 - -  Feeling bad or failure about yourself  1 1 0 - -  Trouble  concentrating 3 3 2  - -  Moving slowly or fidgety/restless 0 0 0 - -  Suicidal thoughts 0 0 0 - -  PHQ-9 Score 14 12 9  - -  Difficult doing work/chores - Somewhat difficult Very difficult - -  Some recent data might be hidden   GAD 7 : Generalized Anxiety Score 05/04/2017 07/09/2016  Nervous, Anxious, on Edge 3 1  Control/stop worrying 3 1  Worry too much - different things 3 1  Trouble relaxing 3 2  Restless 3 1  Easily annoyed or irritable 2 0  Afraid - awful might happen 3 0  Total GAD 7 Score 20 6    Assessment & Plan:    Annual physical exam: Up-to-date on health maintenances.  Discussed about lifestyle change including diet and exercise.  She says she is not motivated about exercise.  She has underlying depression.  She is on Zoloft.  Reports good compliance with this medication.  She is interested in [facility].  Warm handoff initiated.   Depression/anxiety: PHQ 9 and GAD 7 elevated to 14 and 20 respectively partly due to some family stress.  She also have hypothyroidism with recent TSH to 36.  We increased the Synthroid from 12mcg to 150 mcg about 3 weeks ago.  We will check vitamin D level today. she is interested in nonpharmacological interventions besides medication.  Warm handoff to [facility].  We will continue Zoloft for now.  We will readdress this again when she returns in 5 weeks.  Cervicalgia: She has significant degenerative changes in her cervical spines with no significant spinal stenosis but mild to moderate bilateral C5 foraminal stenosis noted on her lumbar spine MRI about a year ago.  She is followed by neurosurgery for her lumbar spine issues/back pain - baclofen (LIORESAL) 10 MG tablet; Take 1 tablet (10 mg total) by mouth 3 (three) times daily.  Dispense: 30 each; Refill: 0  Right arm pain: Unclear etiology to me.  She is tender to palpation midway over the right humerus mainly posteriorly.  Pain also elicited with resisted extension of her right arm at the elbow.  - DG Humerus Right; Future  Depression and anxiety:  Hypothyroidism/elevated TSH/dysphagia: She has history of hypothyroidism.  TSH was elevated to 37 about a month ago.  We increased his Synthroid from 125 mcg to 150 mcg.  She now reports intermittent dysphagia.  I did not appreciate significant thyromegaly on my exam.  However, she says her cardiologist told her she had a little bit of thyroid gland swelling.  She also reports family history of thyroid cancer in her sister - US THYROID; Future ordered and is scheduled -Follow-up in 5 weeks for  TSH   Vitamin D insufficiency: history of this.  Was on vitamin D capsule previously - Vitamin D, 25-hydroxy  Follow-up in 5 weeks for [facility]  Wendee Beavers PGY-3 Pager [phone] 05/04/17  12:36 PM

## 2017-05-04 NOTE — Patient Instructions (Signed)
It was great seeing you today! We have addressed the following issues today  Arm pain: We have ordered an x-ray of your right arm.  Please go to [facility] and have this done.  We have also sent a prescription for baclofen to your pharmacy.  This might help with your pain as well.  This medicine may make you sleepy or drowsy.  Please be cautious if you are driving.  Difficulty swallowing: We have ordered an ultrasound of the thyroid.   Dental and vision checkup: Please call your dentist and an eye doctor for your regular checkup.  Diet and exercise: We strongly recommend lifestyle change including diet and exercise.  See below for more on this.  If we did any lab work today, and the results require attention, either me or my nurse will get in touch with you. If everything is normal, you will get a letter in mail and a message via . If you don't hear from [place] in two weeks, please give [place] a call. Otherwise, we look forward to seeing you again at your next visit. If you have any questions or concerns before then, please call the clinic at [phone].  Please bring all your medications to every doctors visit  Sign up for My Chart to have easy access to your labs results, and communication with your Primary care physician.    Please check-out at the front desk before leaving the clinic.    Take Care,   Dr. Cyndia Skeeters  Portion Size   Choose healthier foods such as 100% whole grains, vegetables, fruits, beans, nut seeds, olive oil, most vegetable oils, fat-free dietary, wild game and fish.   Avoid sweet tea, other sweetened beverages, soda, fruit juice, cold cereal and milk and trans fat.   Eat at least 3 meals and 1-2 snacks per day.  Aim for no more than 5 hours between eating.  Eat breakfast within one hour of getting up.    Exercise at least 150 minutes per week, including weight resistance exercises 3 or 4 times per week.   Try to lose at least 7-10% of your current body  weight.   Limit your salt (Sodium) intake to less than 2 gm (2000 mg) a day if you have conditions such as  elevated blood pressure, heart failure...    You may also read about DASH and/or Mediterranean diet at the following web site if you have blood pressure or heart condition. [email or web address].se LACodes.co.za

## 2017-05-04 NOTE — Progress Notes (Signed)
  Type of Service: Integrated Behavioral Health warm handoff  Estimate time:20 minutes : Interpreter:No.  [name] Rowe is a 56 y.o. female referred by Dr. Cyndia Skeeters for assistance with life style changes and lack of motivation. Patient also experiencing symptoms of  anxiety and depression.  Patient reports difficulty with doing exercise due to pain.  Also shared she has a difficult time managing family stress. Patient wants to walk more but has difficult with pain when she walks 20 min or more.   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  Life changes: lack of motivation and managing emotions with new medical diagnosis for family member.  GOALS: Patient will reduce symptoms of: anxiety and depression, and increase  ability SW:HQPRFF skills, healthy habits and self-management skills, will also Increase motivation to adhere to plan of care. INTERVENTION: , Behavioral Therapy (Relaxed breathing), Advocacy/Education ; Motivational Interviewing and Solution-Focused Strategies, pacing  PHQ 9=14, indication of: moderate depression. GAD-7=20,indication of : severe anxiety.  ISSUES DISCUSSED: Integrated care services, support system, previous and current coping skills, community resources , things patient enjoy or use to enjoy doing, review and education on Pacing and concerns with her family's health.   ASSESSMENT:Patient currently experiencing lack of motivation to walk due to chronic pain.  She also has symptoms of depression and anxiety.  Symptoms exacerbated by chronic pain and new information received about her family member's medical diagnosis. Patient may benefit from,  further assessment and brief therapeutic interventions to assist with managing her symptoms, however patient has not committed to a follow up visit.  PLAN:    1.Patient will F/U with [facility] when she returns to see PCP in a few weeks  2. Behavioral recommendations: relaxed breathing and Pacing  3. Referral:None at this time   Warm Hand Off Completed.     [name] Lanius, LCSW Licensed Clinical Social Worker [facility]   [phone] 2:56 PM

## 2017-05-05 ENCOUNTER — Other Ambulatory Visit: Payer: Self-pay | Admitting: Student

## 2017-05-05 DIAGNOSIS — E559 Vitamin D deficiency, unspecified: Secondary | ICD-10-CM

## 2017-05-05 LAB — VITAMIN D 25 HYDROXY (VIT D DEFICIENCY, FRACTURES): Vit D, 25-Hydroxy: 15.5 ng/mL — ABNORMAL LOW (ref 30.0–100.0)

## 2017-05-05 MED ORDER — MULTIVITAMIN WOMEN 50+ PO TABS: 1.0000 | ORAL_TABLET | Freq: Every day | ORAL | 3 refills | Status: DC

## 2017-05-05 NOTE — Progress Notes (Signed)
Discussed her vitamin D result with the patient and sent Rx for Women 50+ multivitamin.

## 2017-05-10 ENCOUNTER — Ambulatory Visit (HOSPITAL_COMMUNITY)
Admission: RE | Admit: 2017-05-10 | Discharge: 2017-05-10 | Disposition: A | Discharge status: Home / Self Care | Payer: Medicaid Other | Source: Ambulatory Visit | Attending: Family Medicine | Admitting: Family Medicine

## 2017-05-10 DIAGNOSIS — R1310 Dysphagia, unspecified: Secondary | ICD-10-CM | POA: Diagnosis present

## 2017-05-10 DIAGNOSIS — M79601 Pain in right arm: Secondary | ICD-10-CM | POA: Insufficient documentation

## 2017-05-10 DIAGNOSIS — R7989 Other specified abnormal findings of blood chemistry: Secondary | ICD-10-CM | POA: Diagnosis not present

## 2017-05-22 NOTE — Progress Notes (Signed)
Cardiology Office Note   Date:  12/05/1599   ID:  [name] Rowe, DOB [birth year], MRN [id number]  PCP:  [name], MD  Cardiologist:   Skeet Latch, MD   No chief complaint on file.     History of Present Illness: [name] Rowe is a 56 y.o. female with prior stroke, hypertension, hyperlipidemia, fibromuscular dysplasia, coronary vasospasm, and inappropriate sinus tachycardia who presents for follow up.  [name] Rowe was previously a patient of Dr. Aundra Dubin.  She had a stent placed in 2009 for renal artery stenosis/fibromuscular dysplasia.  She underwent a LHC at that time in [place] that showed catheter-induced right coronary spasm.  In 01/2015 she had another cath that reportedly showed 40-50% LAD disease.  She has been evaluated by her PCP and Dr. Aundra Dubin for atypical chest pain.  Cardiac enzymes and EKG have been unremarkable.  She saw Kerin Ransom 06/2016 and her blood pressure was poorly-controlled.  Nifedipine was switched to diltiazem due to palpitaitons.  She then saw him 10/2016 and reported awakening gasping for sleep.  She was referred for a sleep study but hasn't yet completed it due to travel.  She was recently visiting her sister who noted that it sounds like someone is trying to choke her when she sleeps.  She stops breathing and gasps for air.   At her last appointment [name] Rowe reported labile hypertension and palpitations.  She reported episodes of tachycardia with presyncope.  Her heart rate went up to the 130s or 140s.  At that appointment carvedilol was discontinued and she was started on metoprolol.  Given that her blood pressure was also elevated, amlodipine was added.  She was referred for sleep study due to report of apnea.  Since her last appointment [name] Rowe has been feeling well.  She noted an improvement in her palpitations.  She has then less frequently but still has episodes approximately 2 times per week.  She also notes that since stopping diltiazem and  switching to metoprolol she no longer has headaches.  She denies chest pain or shortness of breath.  Last week she had to shovel snow and then had a lot of soreness in her neck and shoulders.  She has been otherwise well.  She denies lower extremity edema, orthopnea, or PND.  She tries to exercise by walking 10 minutes most days of the week.  She has no exertional symptoms.  Her blood pressure at home has been running in the 270W-237S systolic.     Past Medical History:  Diagnosis Date  . Adenomyosis   . Anginal pain ([place])   . Anxiety 2001  . Chest pain    a. No angiographic CAD by cath 2009 in [place]. There was catheter-induced RCA vasospasm versus microvascular obstruction. bCarlton Adam myoview 04/2010 - EF 79%, normal perfusion.;  c. ETT-Myoview 7/13: no ischemia, EF 77%, submax exercise  . Chronic back pain    had 3 epidural injections on 02-14-13  . Complication of anesthesia    has difficulty voiding after surgery  . Coronary vasospasm ([place])   . CVA (cerebral vascular accident) ([place]) 2006   lost hearing on left  . Depression 2001  . Dysrhythmia   . Fibromuscular dysplasia ([place]) 2009   a. Renal artery stenosis due to this. b. Carotid dopplers 07/2011 c/w FMD but no significant stenosis  . Fibromyalgia 1999  . H/O Graves' disease 2003   s/p radio-iodine treatment in 2003   . Hearing loss of left ear  s/p CVA 2006  . History of stent insertion of renal artery 2009   right renal stent  . HLD (hyperlipidemia) 2000  . HTN (hypertension) 1988   a. Likely related to RAS; urinary metanephrines and plasma renin/aldosterone ratio normal. b. H/o HTN urgency 06/2011 after being off BP meds for a number of days  . Hx of echocardiogram    a. Echo (1/13): EF 25-42%, grade 1 diastolic dysfunction  . Hypertensive crisis 06/29/2011  . Hypothyroidism 2003   Graves disease s/p radio-iodine treatment in 2003   . Irritable bowel syndrome   . Leiomyoma   . Migraine headache   . Myocardial  infarction ([place]) 2009  . Personal history of colonic polyps-sessile serrated adenoma 02/23/2013  . Renal artery stenosis ([place])    a. Due to fibromuscular dysplasia. b. Renal stent 2009. c. Studies:  CTA abdomen (1/13) with evidence for fibromuscular dysplasia, right renal artery stent appears patent. Renal artery dopplers (2/13) with FMD but no evidence for signficant stenosis.   . Tachycardia 2000   10/27/10-Holter -NSR with occ sinus tachy-rare PACs, no sig arrhythmia    Past Surgical History:  Procedure Laterality Date  . BACK SURGERY  2010 and 2011   In CT.  11/19/2008 (possibly a laminectomy) and in May of 2011 she underwent a L2-L3 fusion.  . cardiac cath    . CARDIAC CATHETERIZATION  2009   states it was normal  . CARDIAC CATHETERIZATION N/A 02/03/2015   Procedure: Left Heart Cath and Coronary Angiography;  Surgeon: Larey Dresser, MD;  Location: [facility];  Service: Cardiovascular;  Laterality: N/A;  . COLONOSCOPY    . COLONOSCOPY    . HIP SURGERY Bilateral 1988 and 1989   "scraped head of femur"  . KNEE ARTHROSCOPY Left   . LUMBAR LAMINECTOMY/DECOMPRESSION MICRODISCECTOMY Left 05/25/2013   Procedure: LUMBAR LAMINECTOMY/DECOMPRESSION MICRODISCECTOMY 1 LEVEL;  Surgeon: Winfield Cunas, MD;  Location: [facility];  Service: Neurosurgery;  Laterality: Left;  LEFT L5S1 microdiskectomy  . RENAL ARTERY STENT  2009  . ROBOTIC ASSISTED LAP VAGINAL HYSTERECTOMY  05/13/2008   [facility] hysterectomy  for myomatous uterus   . SPINE SURGERY    . VAGINAL HYSTERECTOMY       Current Outpatient Medications  Medication Sig Dispense Refill  . amLODipine (NORVASC) 2.5 MG tablet Take 1 tablet (2.5 mg total) daily by mouth. 30 tablet 5  . aspirin EC 81 MG tablet Take 81 mg by mouth daily.    . baclofen (LIORESAL) 10 MG tablet Take 1 tablet (10 mg total) by mouth 3 (three) times daily. 30 each 0  . diltiazem (DILACOR XR) 180 MG 24 hr capsule Take 2 capsules (360 mg total) by mouth  daily. (Patient taking differently: Take 180 mg by mouth daily. ) 60 capsule 0  . levothyroxine (SYNTHROID, LEVOTHROID) 150 MCG tablet Take 1 tablet (150 mcg total) daily by mouth. 90 tablet 0  . linaclotide (LINZESS) 290 MCG CAPS capsule Take 1 capsule (290 mcg total) by mouth daily. 30 capsule 11  . metoprolol succinate (TOPROL XL) 50 MG 24 hr tablet Take 1 tablet (50 mg total) at bedtime by mouth. Take with or immediately following a meal. 30 tablet 5  . Multiple Vitamins-Minerals (MULTIVITAMIN WOMEN 50+) TABS Take 1 tablet by mouth daily. 90 tablet 3  . nitroGLYCERIN (NITROSTAT) 0.4 MG SL tablet Place 1 tablet (0.4 mg total) under the tongue every 5 (five) minutes as needed. x3 doses as needed for chest  pain 100 tablet 3  . QUEtiapine (SEROQUEL) 200 MG tablet Take by mouth.    . rosuvastatin (CRESTOR) 40 MG tablet Take 1 tablet (40 mg total) by mouth daily. 30 tablet 0  . sertraline (ZOLOFT) 100 MG tablet Take 1 tablet (100 mg total) by mouth daily. 30 tablet 3  . traZODone (DESYREL) 150 MG tablet Take 150 mg by mouth at bedtime.      No current facility-administered medications for this visit.     Allergies:   Hydromorphone hcl; Prochlorperazine edisylate; Sumatriptan; Atorvastatin; and Tramadol    Social History:  The patient  reports that  has never smoked. she has never used smokeless tobacco. She reports that she drinks alcohol. She reports that she does not use drugs.   Family History:  The patient's family history includes Asthma in her mother; Depression in her sister; Diabetes in her maternal aunt; Early death (age of onset: 72) in her brother; Heart attack in her father and unknown relative; Heart attack (age of onset: 64) in her brother; Hyperlipidemia in her brother, father, and mother; Hypertension in her brother, father, mother, and sister; Metabolic syndrome in her sister; Prostate cancer in her father; Stroke in her father, mother, and unknown relative.    ROS:  Please see  the history of present illness.   Otherwise, review of systems are positive for cramps in toes.   All other systems are reviewed and negative.    PHYSICAL EXAM: VS:  BP 121/84   Pulse 92   Ht 5\' 4"  (1.626 m)   Wt 144 lb 6.4 oz (65.5 kg)   BMI 24.79 kg/m  , BMI Body mass index is 24.79 kg/m. GENERAL:  Well appearing HEENT: Pupils equal round and reactive, fundi not visualized, oral mucosa unremarkable NECK:  No jugular venous distention, waveform within normal limits, carotid upstroke brisk and symmetric, no bruits, no thyromegaly LUNGS:  Clear to auscultation bilaterally HEART:  RRR.  PMI not displaced or sustained,S1 and S2 within normal limits, no S3, no S4, no clicks, no rubs, no murmurs ABD:  Flat, positive bowel sounds normal in frequency in pitch, no bruits, no rebound, no guarding, no midline pulsatile mass, no hepatomegaly, no splenomegaly EXT:  2 plus pulses throughout, no edema, no cyanosis no clubbing SKIN:  No rashes no nodules NEURO:  Cranial nerves II through XII grossly intact, motor grossly intact throughout PSYCH:  Cognitively intact, oriented to person place and time   EKG:  EKG is not ordered today. The ekg ordered 04/21/17 demonstrates sinus rhythm.  Rate 100 bpm.  QTc 479 ms.    Recent Labs: 08/30/2016: B Natriuretic Peptide 14.9 01/23/2017: ALT 36; Hemoglobin 13.8; Platelets 265 04/06/2017: BUN 14; Creatinine, Ser 1.18; Potassium 4.8; Sodium 141; TSH 36.850    Lipid Panel    Component Value Date/Time   CHOL 268 (H) 08/30/2016 0858   TRIG 202 (H) 08/30/2016 0858   HDL 46 08/30/2016 0858   CHOLHDL 5.8 08/30/2016 0858   VLDL 40 08/30/2016 0858   LDLCALC 182 (H) 08/30/2016 0858   LDLDIRECT 147.4 06/12/2013 0943      Wt Readings from Last 3 Encounters:  05/23/17 144 lb 6.4 oz (65.5 kg)  05/04/17 145 lb 6.4 oz (66 kg)  04/20/17 147 lb 3.2 oz (66.8 kg)      ASSESSMENT AND PLAN:  # Sinus tachycardia: [name] Rowe has episodes of palpitations and  lightheadedness that may be related to inappropriate sinus tachycardia.  Symptoms are better-controlled on metoprolol but  still occur twice per week.  We will increase metoprolol to 100 mg daily.  We will also give her metoprolol tartrate 12.5 mg to be taken as needed for palpitations.  # Hypertension:  # Renal artery stenosis:  BP well-controlled.  Increase metoprolol as above.  # Non-obstructive CAD: # Coronary spasm: Continue aspirin, rosuvastatin and beta blocker.    # Hyperlipidemia: Continue rosuvastatin.  Repeat lipids at follow up.   # Apnea: Sleep study pending.     Current medicines are reviewed at length with the patient today.  The patient does not have concerns regarding medicines.  The following changes have been made: Increase metoprolol Labs/ tests ordered today include:   No orders of the defined types were placed in this encounter.    Disposition:   FU with Jerris Fleer C. Oval Linsey, MD, [facility] in 4 months   This note was written with the assistance of speech recognition software.  Please excuse any transcriptional errors.  Signed, Mady Oubre C. Oval Linsey, MD, [facility]  05/23/2017 9:45 AM    [facility]

## 2017-05-23 ENCOUNTER — Ambulatory Visit: Payer: Medicaid Other | Admitting: Cardiovascular Disease

## 2017-05-23 ENCOUNTER — Encounter: Payer: Self-pay | Admitting: Cardiovascular Disease

## 2017-05-23 VITALS — BP 121/84 | HR 92 | Ht 64.0 in | Wt 144.4 lb

## 2017-05-23 DIAGNOSIS — R000 Tachycardia, unspecified: Secondary | ICD-10-CM | POA: Diagnosis not present

## 2017-05-23 DIAGNOSIS — E782 Mixed hyperlipidemia: Secondary | ICD-10-CM

## 2017-05-23 DIAGNOSIS — I10 Essential (primary) hypertension: Secondary | ICD-10-CM | POA: Diagnosis not present

## 2017-05-23 DIAGNOSIS — I773 Arterial fibromuscular dysplasia: Secondary | ICD-10-CM | POA: Diagnosis not present

## 2017-05-23 DIAGNOSIS — Z8673 Personal history of transient ischemic attack (TIA), and cerebral infarction without residual deficits: Secondary | ICD-10-CM

## 2017-05-23 MED ORDER — METOPROLOL SUCCINATE ER 100 MG PO TB24: 100.0000 mg | ORAL_TABLET | Freq: Every day | ORAL | 3 refills | Status: DC

## 2017-05-23 MED ORDER — METOPROLOL TARTRATE 25 MG PO TABS: ORAL_TABLET | ORAL | 3 refills | Status: DC

## 2017-05-23 NOTE — Patient Instructions (Signed)
Medication Instructions:  INCREASE YOUR METOPROLOL SUCC TO 100 MG DAILY  START METOPROLOL TART 25 MG 1/2 TABLET DAILY AS NEEDED FOR PALPITATIONS   Labwork: NONE  Testing/Procedures: NONE  Follow-Up: Your physician wants you to follow-up in: [place] will receive a reminder letter in the mail two months in advance. If you don't receive a letter, please call our office to schedule the follow-up appointment.  If you need a refill on your cardiac medications before your next appointment, please call your pharmacy.

## 2017-05-23 NOTE — Addendum Note (Signed)
Addended by: Alvina Filbert B on: 05/23/2017 10:02 AM   Modules accepted: Orders

## 2017-06-09 ENCOUNTER — Ambulatory Visit (HOSPITAL_BASED_OUTPATIENT_CLINIC_OR_DEPARTMENT_OTHER): Payer: Medicaid Other | Attending: Cardiovascular Disease | Admitting: Cardiovascular Disease

## 2017-06-09 VITALS — Ht 64.0 in | Wt 143.0 lb

## 2017-06-09 DIAGNOSIS — Z79899 Other long term (current) drug therapy: Secondary | ICD-10-CM | POA: Insufficient documentation

## 2017-06-09 DIAGNOSIS — R0681 Apnea, not elsewhere classified: Secondary | ICD-10-CM | POA: Insufficient documentation

## 2017-06-09 DIAGNOSIS — Z7982 Long term (current) use of aspirin: Secondary | ICD-10-CM | POA: Diagnosis not present

## 2017-06-09 DIAGNOSIS — Z79890 Hormone replacement therapy (postmenopausal): Secondary | ICD-10-CM | POA: Diagnosis not present

## 2017-06-09 DIAGNOSIS — R0683 Snoring: Secondary | ICD-10-CM

## 2017-06-10 ENCOUNTER — Other Ambulatory Visit: Payer: Self-pay | Admitting: Student

## 2017-06-10 DIAGNOSIS — M542 Cervicalgia: Secondary | ICD-10-CM

## 2017-06-10 MED ORDER — BACLOFEN 10 MG PO TABS: 10.0000 mg | ORAL_TABLET | Freq: Three times a day (TID) | ORAL | 0 refills | Status: DC

## 2017-07-01 ENCOUNTER — Encounter (HOSPITAL_BASED_OUTPATIENT_CLINIC_OR_DEPARTMENT_OTHER): Payer: Self-pay | Admitting: Cardiovascular Disease

## 2017-07-01 NOTE — Procedures (Signed)
Patient Name: [name] Rowe, [place] Study Date: 25/42/7062 Gender: Female D.O.B: June 18, 1960 Age (years): 56 Referring Provider: Skeet Latch Height (inches): 64 Interpreting Physician: Shelva Majestic MD, ABSM Weight (lbs): 143 RPSGT: Baxter Flattery BMI: 25 MRN: [id number] Neck Size: 14.00  CLINICAL INFORMATION Sleep Study Type: NPSG  Indication for sleep study: Fatigue, Snoring, Witnessed Apneas ??????? Epworth Sleepiness Score: 2  SLEEP STUDY TECHNIQUE As per the AASM Manual for the Scoring of Sleep and Associated Events v2.3 (April 2016) with a hypopnea requiring 4% desaturations.  The channels recorded and monitored were frontal, central and occipital EEG, electrooculogram (EOG), submentalis EMG (chin), nasal and oral airflow, thoracic and abdominal wall motion, anterior tibialis EMG, snore microphone, electrocardiogram, and pulse oximetry.  MEDICATIONS     amLODipine (NORVASC) 2.5 MG tablet         aspirin EC 81 MG tablet         baclofen (LIORESAL) 10 MG tablet         levothyroxine (SYNTHROID, LEVOTHROID) 150 MCG tablet         linaclotide (LINZESS) 290 MCG CAPS capsule         metoprolol succinate (TOPROL-XL) 100 MG 24 hr tablet         metoprolol tartrate (LOPRESSOR) 25 MG tablet         Multiple Vitamins-Minerals (MULTIVITAMIN WOMEN 50+) TABS         nitroGLYCERIN (NITROSTAT) 0.4 MG SL tablet         QUEtiapine (SEROQUEL) 200 MG tablet         rosuvastatin (CRESTOR) 40 MG tablet         sertraline (ZOLOFT) 100 MG tablet         traZODone (DESYREL) 150 MG tablet      Medications self-administered by patient taken the night of the study : N/A  SLEEP ARCHITECTURE The study was initiated at 10:49:25 PM and ended at 5:07:25 AM.  Sleep onset time was 3.4 minutes and the sleep efficiency was 85.5%. The total sleep time was 323.1 minutes. ???????Wake after sleep onset (WASO) was 51.5 minutes  Stage REM latency was 167.5 minutes.  The patient spent 7.58% of the  night in stage N1 sleep, 81.43% in stage N2 sleep, 4.18% in stage N3 and 6.81% in REM.  Alpha intrusion was absent.  Supine sleep was 52.74%.  RESPIRATORY PARAMETERS The overall apnea/hypopnea index (AHI) was 2.0 per hour. There were 9 total apneas, including 3 obstructive, 6 central and 0 mixed apneas. There were 2 hypopneas and 0 RERAs.  The AHI during Stage REM sleep was 0.0 per hour.  AHI while supine was 3.5 per hour.  The mean oxygen saturation was 95.97%. The minimum SpO2 during sleep was 92.00%.  Soft snoring was noted during this study.  CARDIAC DATA The 2 lead EKG demonstrated sinus rhythm. The mean heart rate was 68.91 beats per minute. Other EKG findings include: None.  LEG MOVEMENT DATA The total PLMS were 21 with a resulting PLMS index of 3.90. Associated arousal with leg movement index was 0.0 .  IMPRESSIONS - No significant obstructive sleep apnea occurred during this study (AHI 2.0/h; RDI 2.9/h). - No significant central sleep apnea occurred during this study (CAI = 1.1/h). - No significant oxygen desaturation during the study (O2 nadir 92%). - Abnormal sleep architecture with a reduction in REM sleep and prolonged latency to REM sleep. - The patient snored with soft snoring volume. - No cardiac abnormalities were noted during this study. - Clinically  significant periodic limb movements did not occur during sleep. No significant associated arousals.  DIAGNOSIS - Snoring  RECOMMENDATIONS - At present there is no indication for CPAP therapy. - Efforts should be made to optimize nbasal and oropharyngeal patency.  - Avoid alcohol, sedatives and other CNS depressants that may worsen sleep apnea and disrupt normal sleep architecture. - Sleep hygiene should be reviewed to assess factors that may improve sleep quality. - Weight management and regular exercise should be initiated or continued if appropriate.  [Electronically signed] 07/01/2017 11:09 AM  Shelva Majestic MD, [facility], [place], American Board of Sleep Medicine   NPI: [id number] [place] PH: [phone]   FX: [phone] [place]

## 2017-07-06 ENCOUNTER — Telehealth: Payer: Self-pay | Admitting: *Deleted

## 2017-07-06 NOTE — Telephone Encounter (Addendum)
-----   Message from Troy Sine, MD sent at 07/01/2017 11:15 AM EST ----- Hayley; please notify pt results;  No sleep apnea.    pt aware of results

## 2017-07-15 NOTE — Progress Notes (Signed)
Telephone note 1/30

## 2017-08-09 ENCOUNTER — Other Ambulatory Visit: Payer: Self-pay | Admitting: *Deleted

## 2017-08-09 DIAGNOSIS — E038 Other specified hypothyroidism: Secondary | ICD-10-CM

## 2017-08-09 MED ORDER — LEVOTHYROXINE SODIUM 150 MCG PO TABS: 150.0000 ug | ORAL_TABLET | Freq: Every day | ORAL | 0 refills | Status: DC

## 2017-08-10 NOTE — Telephone Encounter (Signed)
Pt called again requesting this refill, I informed her this was sent in yesterday and to check with [facility]. Wallace Cullens, RN

## 2017-10-22 ENCOUNTER — Other Ambulatory Visit: Payer: Self-pay | Admitting: Physical Medicine and Rehabilitation

## 2017-10-22 DIAGNOSIS — M5412 Radiculopathy, cervical region: Secondary | ICD-10-CM

## 2017-11-01 ENCOUNTER — Ambulatory Visit
Admission: RE | Admit: 2017-11-01 | Discharge: 2017-11-01 | Disposition: A | Discharge status: Home / Self Care | Payer: Medicaid Other | Source: Ambulatory Visit | Attending: Physical Medicine and Rehabilitation | Admitting: Physical Medicine and Rehabilitation

## 2017-11-01 DIAGNOSIS — M5412 Radiculopathy, cervical region: Secondary | ICD-10-CM

## 2017-11-08 ENCOUNTER — Other Ambulatory Visit: Payer: Self-pay | Admitting: Student

## 2017-11-08 DIAGNOSIS — E038 Other specified hypothyroidism: Secondary | ICD-10-CM

## 2018-01-13 ENCOUNTER — Other Ambulatory Visit: Payer: Self-pay | Admitting: *Deleted

## 2018-01-16 MED ORDER — LINACLOTIDE 290 MCG PO CAPS: 290.0000 ug | ORAL_CAPSULE | Freq: Every day | ORAL | 11 refills | Status: DC

## 2018-04-03 ENCOUNTER — Encounter: Payer: Self-pay | Admitting: Internal Medicine

## 2018-05-15 ENCOUNTER — Encounter: Payer: Medicaid Other | Admitting: Internal Medicine

## 2018-05-22 ENCOUNTER — Ambulatory Visit (AMBULATORY_SURGERY_CENTER): Payer: Self-pay | Admitting: *Deleted

## 2018-05-22 VITALS — Ht 64.0 in | Wt 153.0 lb

## 2018-05-22 DIAGNOSIS — Z86010 Personal history of colonic polyps: Secondary | ICD-10-CM

## 2018-05-22 MED ORDER — NA SULFATE-K SULFATE-MG SULF 17.5-3.13-1.6 GM/177ML PO SOLN: ORAL | 0 refills | Status: DC

## 2018-05-22 NOTE — Progress Notes (Signed)
Patient denies any allergies to eggs or soy. Patient denies any problems with anesthesia/sedation. Patient denies any oxygen use at home. Patient denies taking any diet/weight loss medications or blood thinners. EMMI education offered, pt declined. Patient was given Suprep, she has medicaid. Patient states she has a BM daily as long as she takes Linzess, encouraged patient to keep taking this as directed. Patient c/o "heart burn for the past 2 months", encouraged patient to speak with Dr.Gessner about this day of procedure,also her son had stomach tumor cancer removed per pt.

## 2018-05-25 ENCOUNTER — Other Ambulatory Visit: Payer: Self-pay | Admitting: Cardiovascular Disease

## 2018-05-29 ENCOUNTER — Emergency Department (HOSPITAL_COMMUNITY): Payer: Medicaid Other

## 2018-05-29 ENCOUNTER — Other Ambulatory Visit: Payer: Self-pay

## 2018-05-29 ENCOUNTER — Encounter (HOSPITAL_COMMUNITY): Payer: Self-pay | Admitting: Emergency Medicine

## 2018-05-29 ENCOUNTER — Emergency Department (HOSPITAL_COMMUNITY)
Admission: EM | Admit: 2018-05-29 | Discharge: 2018-05-30 | Disposition: A | Discharge status: Home / Self Care | Payer: Medicaid Other | Attending: Emergency Medicine | Admitting: Emergency Medicine

## 2018-05-29 DIAGNOSIS — R112 Nausea with vomiting, unspecified: Secondary | ICD-10-CM | POA: Diagnosis not present

## 2018-05-29 DIAGNOSIS — N189 Chronic kidney disease, unspecified: Secondary | ICD-10-CM | POA: Diagnosis not present

## 2018-05-29 DIAGNOSIS — R51 Headache: Secondary | ICD-10-CM | POA: Diagnosis not present

## 2018-05-29 DIAGNOSIS — Z79899 Other long term (current) drug therapy: Secondary | ICD-10-CM | POA: Diagnosis not present

## 2018-05-29 DIAGNOSIS — I10 Essential (primary) hypertension: Secondary | ICD-10-CM

## 2018-05-29 DIAGNOSIS — R079 Chest pain, unspecified: Secondary | ICD-10-CM

## 2018-05-29 DIAGNOSIS — R0789 Other chest pain: Secondary | ICD-10-CM | POA: Insufficient documentation

## 2018-05-29 DIAGNOSIS — I252 Old myocardial infarction: Secondary | ICD-10-CM | POA: Diagnosis not present

## 2018-05-29 DIAGNOSIS — E039 Hypothyroidism, unspecified: Secondary | ICD-10-CM | POA: Insufficient documentation

## 2018-05-29 DIAGNOSIS — I129 Hypertensive chronic kidney disease with stage 1 through stage 4 chronic kidney disease, or unspecified chronic kidney disease: Secondary | ICD-10-CM | POA: Diagnosis not present

## 2018-05-29 DIAGNOSIS — R519 Headache, unspecified: Secondary | ICD-10-CM

## 2018-05-29 NOTE — ED Triage Notes (Addendum)
Forgot to take BP meds last night.  Reports BP elevated today.  Called PCP this morning and instructed to take 1 1/2 pills of Metoprolol 100 mg.  States BP initially went down but she took a nap and woke up with a headache and BP was 189/126 and 200/133.  C/o posterior headache and neck pain since 11:30am.  No neuro deficits noted.  Also reports tightness to center of chest, nausea, and vomiting x 3 since 6pm.

## 2018-05-30 ENCOUNTER — Emergency Department (HOSPITAL_COMMUNITY): Payer: Medicaid Other

## 2018-05-30 LAB — BASIC METABOLIC PANEL
ANION GAP: 8 (ref 5–15)
BUN: 11 mg/dL (ref 6–20)
CALCIUM: 10.2 mg/dL (ref 8.9–10.3)
CO2: 27 mmol/L (ref 22–32)
CREATININE: 1.07 mg/dL — AB (ref 0.44–1.00)
Chloride: 104 mmol/L (ref 98–111)
GFR, EST NON AFRICAN AMERICAN: 58 mL/min — AB (ref >60)
Glucose, Bld: 105 mg/dL — ABNORMAL HIGH (ref 70–99)
Potassium: 4.3 mmol/L (ref 3.5–5.1)
SODIUM: 139 mmol/L (ref 135–145)

## 2018-05-30 LAB — CBC
HCT: 41.7 % (ref 36.0–46.0)
Hemoglobin: 14.3 g/dL (ref 12.0–15.0)
MCH: 30.1 pg (ref 26.0–34.0)
MCHC: 34.3 g/dL (ref 30.0–36.0)
MCV: 87.8 fL (ref 80.0–100.0)
NRBC: 0 % (ref 0.0–0.2)
PLATELETS: 411 10*3/uL — AB (ref 150–400)
RBC: 4.75 MIL/uL (ref 3.87–5.11)
RDW: 12.2 % (ref 11.5–15.5)
WBC: 6.7 10*3/uL (ref 4.0–10.5)

## 2018-05-30 LAB — HEPATIC FUNCTION PANEL
ALK PHOS: 92 U/L (ref 38–126)
ALT: 27 U/L (ref 0–44)
AST: 26 U/L (ref 15–41)
Albumin: 4 g/dL (ref 3.5–5.0)
BILIRUBIN INDIRECT: 0.4 mg/dL (ref 0.3–0.9)
Bilirubin, Direct: 0.1 mg/dL (ref 0.0–0.2)
TOTAL PROTEIN: 7.3 g/dL (ref 6.5–8.1)
Total Bilirubin: 0.5 mg/dL (ref 0.3–1.2)

## 2018-05-30 LAB — LIPASE, BLOOD: LIPASE: 26 U/L (ref 11–51)

## 2018-05-30 LAB — I-STAT TROPONIN, ED: TROPONIN I, POC: 0.01 ng/mL (ref 0.00–0.08)

## 2018-05-30 MED ORDER — SODIUM CHLORIDE 0.9 % IV BOLUS
1000.0000 mL | Freq: Once | INTRAVENOUS | Status: AC
  Administered 2018-05-30: 1000 mL via INTRAVENOUS

## 2018-05-30 MED ORDER — DIPHENHYDRAMINE HCL 50 MG/ML IJ SOLN
12.5000 mg | Freq: Once | INTRAMUSCULAR | Status: AC
  Administered 2018-05-30: 12.5 mg via INTRAVENOUS
  Filled 2018-05-30: qty 1

## 2018-05-30 MED ORDER — METOCLOPRAMIDE HCL 5 MG/ML IJ SOLN
10.0000 mg | Freq: Once | INTRAMUSCULAR | Status: AC
  Administered 2018-05-30: 10 mg via INTRAVENOUS
  Filled 2018-05-30: qty 2

## 2018-05-30 MED ORDER — ONDANSETRON HCL 4 MG/2ML IJ SOLN
4.0000 mg | Freq: Once | INTRAMUSCULAR | Status: AC
  Administered 2018-05-30: 4 mg via INTRAVENOUS
  Filled 2018-05-30: qty 2

## 2018-05-30 MED ORDER — KETOROLAC TROMETHAMINE 30 MG/ML IJ SOLN
30.0000 mg | Freq: Once | INTRAMUSCULAR | Status: AC
  Administered 2018-05-30: 30 mg via INTRAVENOUS
  Filled 2018-05-30: qty 1

## 2018-05-30 MED ORDER — HYDRALAZINE HCL 20 MG/ML IJ SOLN
10.0000 mg | INTRAMUSCULAR | Status: AC
  Administered 2018-05-30: 10 mg via INTRAVENOUS
  Filled 2018-05-30: qty 1

## 2018-05-30 MED ORDER — ALUM & MAG HYDROXIDE-SIMETH 200-200-20 MG/5ML PO SUSP
30.0000 mL | Freq: Once | ORAL | Status: AC
  Administered 2018-05-30: 30 mL via ORAL
  Filled 2018-05-30: qty 30

## 2018-05-30 MED ORDER — ASPIRIN 81 MG PO CHEW
324.0000 mg | CHEWABLE_TABLET | Freq: Once | ORAL | Status: AC
  Administered 2018-05-30: 324 mg via ORAL
  Filled 2018-05-30: qty 4

## 2018-05-30 NOTE — ED Notes (Signed)
Reviewed d/c instructions with pt, who verbalized understanding and had no outstanding questions. Pt departed in NAD.   

## 2018-05-30 NOTE — ED Notes (Signed)
Pt ambulatory to the restroom with a steady gait.

## 2018-05-30 NOTE — ED Notes (Signed)
Patient ambulated to the bathroom with 1 assist. Complains of slight dizziness and nausea with ambulation.

## 2018-05-30 NOTE — ED Provider Notes (Signed)
[facility] EMERGENCY DEPARTMENT Provider Note   CSN: [id number] Arrival date & time: 05/29/18  2131     History   Chief Complaint Chief Complaint  Patient presents with  . Hypertension  . Chest Pain  . Headache    HPI [name] Rowe is a 57 y.o. female with a hx of chronic kidney disease, coronary vasospasm, CVA, fibromyalgia, hypertension secondary to renal artery stenosis, hyperlipidemia, MI presents to the Emergency Department complaining of gradual, persistent, progressively worsening hypertension onset this morning around 11:30am.  Pt reports about that time she developed a gradual onset headache.  She reports she realized that she had not taken her HTN medications the night before and therefore took 1 tablet of metoprolol at 11:30am. She normally takes her medications at night.  Pt reports the headache persisted therefore she took her BP around noon and found it to be high.  She called her PCP who advised her to take an additional 1/2 tablet.  Pt reports she did this arounf 2:30pm and then took a nap.  When she awoke, her SBP was in the 150s and her headache was improved.  Pt reports her headache returned around 6pm and she developed chest pressure, nausea and 1 episode of NBNB emesis.  Pt reports her SBP at that time was in the 180s.  Pt denies cocaine or other drug usage.  She denies new or very salty foods.  Pt denies numbness, weakness, tingling, vision changes, diaphoresis, syncope or near syncope.  No know aggravating factors.    The history is provided by the patient and medical records. No language interpreter was used.    Past Medical History:  Diagnosis Date  . Adenomyosis   . Anginal pain ([place])   . Anxiety 2001  . Chest pain    a. No angiographic CAD by cath 2009 in [place]. There was catheter-induced RCA vasospasm versus microvascular obstruction. bCarlton Adam myoview 04/2010 - EF 79%, normal perfusion.;  c. ETT-Myoview 7/13: no ischemia, EF  77%, submax exercise  . Chronic back pain    had 3 epidural injections on 02-14-13  . Chronic kidney disease   . Complication of anesthesia    has difficulty voiding after surgery  . Coronary vasospasm ([place])   . CVA (cerebral vascular accident) ([place]) 2006   lost hearing on left  . Depression 2001  . Dysrhythmia   . Fibromuscular dysplasia ([place]) 2009   a. Renal artery stenosis due to this. b. Carotid dopplers 07/2011 c/w FMD but no significant stenosis  . Fibromyalgia 1999  . H/O Graves' disease 2003   s/p radio-iodine treatment in 2003   . Hearing loss of left ear    s/p CVA 2006  . History of stent insertion of renal artery 2009   right renal stent  . HLD (hyperlipidemia) 2000  . HTN (hypertension) 1988   a. Likely related to RAS; urinary metanephrines and plasma renin/aldosterone ratio normal. b. H/o HTN urgency 06/2011 after being off BP meds for a number of days  . Hx of echocardiogram    a. Echo (1/13): EF 35-57%, grade 1 diastolic dysfunction  . Hypertensive crisis 06/29/2011  . Hypothyroidism 2003   Graves disease s/p radio-iodine treatment in 2003   . Irritable bowel syndrome   . Leiomyoma   . Migraine headache   . Myocardial infarction ([place]) 2009  . Personal history of colonic polyps-sessile serrated adenoma 02/23/2013  . Renal artery stenosis ([place])    a. Due to fibromuscular dysplasia.  b. Renal stent 2009. c. Studies:  CTA abdomen (1/13) with evidence for fibromuscular dysplasia, right renal artery stent appears patent. Renal artery dopplers (2/13) with FMD but no evidence for signficant stenosis.   . Tachycardia 2000   10/27/10-Holter -NSR with occ sinus tachy-rare PACs, no sig arrhythmia    Patient Active Problem List   Diagnosis Date Noted  . At risk for sleep apnea 05/04/2017  . Tachycardia 04/05/2017  . Poor sleep pattern 10/20/2016  . Fatigue 04/07/2016  . Neck pain on left side 03/16/2016  . Acute left-sided thoracic back pain 03/16/2016  . Pain of left  breast 10/30/2015  . Lichen sclerosus 79/07/4095  . Extremity edema 06/24/2014  . Diplopia   . History of coronary vasospasm 04/12/2014  . Bursitis, trochanteric 11/22/2013  . Essential hypertension 11/19/2013  . Acute low back pain 08/10/2013  . Left-sided weakness 08/07/2013  . Personal history of colonic polyps-sessile serrated adenoma 02/23/2013  . HNP (herniated nucleus pulposus), lumbar 12/01/2012  . Lumbar radicular syndrome 12/01/2012  . Seasonal allergies 11/30/2012  . Intervertebral disc protrusion 11/06/2012  . Situational anxiety 09/05/2012  . Vitamin D insufficiency 06/30/2012  . Unilateral hearing loss 06/30/2012  . Vestibular dizziness 06/30/2012  . Routine adult health maintenance 03/31/2012  . Fibromuscular dysplasia ([place]) 08/13/2011  . Dizziness and palpitations, episodic 07/06/2011  . Poorly-controlled hypertension   . History of CVA (cerebrovascular accident)   . Hypothyroidism 04/10/2010  . Dyslipidemia 04/10/2010  . Anxiety 04/10/2010  . Depression 04/10/2010  . Migraine headache 04/10/2010  . RENAL ARTERY STENOSIS 04/10/2010  . Fibromyalgia 04/10/2010  . Status post lumbar surgery 04/10/2010  . Backache 04/09/2010    Past Surgical History:  Procedure Laterality Date  . BACK SURGERY  2010 and 2011   In CT.  11/19/2008 (possibly a laminectomy) and in May of 2011 she underwent a L2-L3 fusion.  . cardiac cath    . CARDIAC CATHETERIZATION  2009   states it was normal  . CARDIAC CATHETERIZATION N/A 02/03/2015   Procedure: Left Heart Cath and Coronary Angiography;  Surgeon: Larey Dresser, MD;  Location: [facility];  Service: Cardiovascular;  Laterality: N/A;  . COLONOSCOPY  last 02/16/2013  . COLONOSCOPY    . HIP SURGERY Bilateral 1988 and 1989   "scraped head of femur"  . KNEE ARTHROSCOPY Left   . LUMBAR LAMINECTOMY/DECOMPRESSION MICRODISCECTOMY Left 05/25/2013   Procedure: LUMBAR LAMINECTOMY/DECOMPRESSION MICRODISCECTOMY 1 LEVEL;  Surgeon:  Winfield Cunas, MD;  Location: [place] [facility];  Service: Neurosurgery;  Laterality: Left;  LEFT L5S1 microdiskectomy  . POLYPECTOMY    . RENAL ARTERY STENT  2009  . ROBOTIC ASSISTED LAP VAGINAL HYSTERECTOMY  05/13/2008   [facility] hysterectomy  for myomatous uterus   . SPINE SURGERY    . VAGINAL HYSTERECTOMY       OB History   No obstetric history on file.      Home Medications    Prior to Admission medications   Medication Sig Start Date End Date Taking? Authorizing Provider  hydrOXYzine (ATARAX/VISTARIL) 25 MG tablet Take 25 mg by mouth every 4 (four) hours as needed for anxiety.    Yes [provider]  levothyroxine (SYNTHROID, LEVOTHROID) 150 MCG tablet TAKE 1 TABLET BY MOUTH ONCE DAILY Patient taking differently: Take 150 mcg by mouth daily before breakfast.  11/08/17  Yes Gonfa, Charlesetta Ivory, MD  linaclotide (LINZESS) 290 MCG CAPS capsule Take 1 capsule (290 mcg total) by mouth daily. 01/16/18  Yes Burr Medico,  Lauren, MD  metoprolol succinate (TOPROL-XL) 100 MG 24 hr tablet Take 1 tablet (100 mg total) by mouth at bedtime. ** DO NOT CRUSH **    (BETA BLOCKER)  Needs appointment 05/26/18  Yes Skeet Latch, MD  metoprolol tartrate (LOPRESSOR) 25 MG tablet 1/2 TABLET BY MOUTH AS NEEDED FOR PALPITATIONS Patient taking differently: Take 12.5 mg by mouth daily as needed (Palpitations).  05/23/17  Yes Skeet Latch, MD  nitroGLYCERIN (NITROSTAT) 0.4 MG SL tablet Place 1 tablet (0.4 mg total) under the tongue every 5 (five) minutes as needed. x3 doses as needed for chest pain 03/22/16  Yes Gonfa, Taye T, MD  QUEtiapine (SEROQUEL) 200 MG tablet Take 200 mg by mouth at bedtime.    Yes [provider]  sertraline (ZOLOFT) 100 MG tablet Take 1 tablet (100 mg total) by mouth daily. 04/06/17  Yes [name], MD  traZODone (DESYREL) 150 MG tablet Take 150 mg by mouth at bedtime.    Yes [provider]  Na Sulfate-K Sulfate-Mg Sulf 17.5-3.13-1.6 GM/177ML SOLN Suprep (no  substitutions)-TAKE AS DIRECTED. Patient not taking: Reported on 05/30/2018 05/22/18   Gatha Mayer, MD    Family History Family History  Problem Relation Age of Onset  . Hypertension Mother   . Asthma Mother   . Hyperlipidemia Mother   . Stroke Mother   . Hypertension Father   . Prostate cancer Father   . Heart attack Father   . Hyperlipidemia Father   . Stroke Father   . Heart attack Other   . Stroke Other   . Hypertension Brother   . Heart attack Brother 79  . Early death Brother 47       massive stroke   . Hyperlipidemia Brother   . Metabolic syndrome Sister   . Depression Sister   . Hypertension Sister   . Diabetes Maternal Aunt   . Stomach cancer Maternal Aunt   . Stomach cancer Son   . Colon cancer Neg Hx   . Colon polyps Neg Hx   . Esophageal cancer Neg Hx   . Rectal cancer Neg Hx     Social History Social History   Tobacco Use  . Smoking status: Never Smoker  . Smokeless tobacco: Never Used  Substance Use Topics  . Alcohol use: Not Currently    Comment: rarely  . Drug use: No     Allergies   Hydromorphone hcl; Prochlorperazine edisylate; Sumatriptan; Atorvastatin; and Tramadol   Review of Systems Review of Systems  Constitutional: Negative for appetite change, diaphoresis, fatigue, fever and unexpected weight change.  HENT: Negative for mouth sores.   Eyes: Negative for visual disturbance.  Respiratory: Negative for cough, chest tightness, shortness of breath and wheezing.   Cardiovascular: Positive for chest pain.  Gastrointestinal: Positive for nausea and vomiting. Negative for abdominal pain, constipation and diarrhea.  Endocrine: Negative for polydipsia, polyphagia and polyuria.  Genitourinary: Negative for dysuria, frequency, hematuria and urgency.  Musculoskeletal: Negative for back pain and neck stiffness.  Skin: Negative for rash.  Allergic/Immunologic: Negative for immunocompromised state.  Neurological: Positive for headaches.  Negative for syncope and light-headedness.  Hematological: Does not bruise/bleed easily.  Psychiatric/Behavioral: Negative for sleep disturbance. The patient is not nervous/anxious.      Physical Exam Updated Vital Signs BP (!) 210/118 (BP Location: Right Arm)   Pulse 72   Temp 98.3 F (36.8 C) (Oral)   Resp 18   Ht 5\' 4"  (1.626 m)   Wt 69.4 kg  SpO2 100%   BMI 26.26 kg/m   Physical Exam Vitals signs and nursing note reviewed.  Constitutional:      General: She is not in acute distress.    Appearance: She is well-developed. She is not diaphoretic.  HENT:     Head: Normocephalic and atraumatic.  Eyes:     General: No scleral icterus.    Conjunctiva/sclera: Conjunctivae normal.     Pupils: Pupils are equal, round, and reactive to light.     Comments: No horizontal, vertical or rotational nystagmus  Neck:     Musculoskeletal: Normal range of motion and neck supple.     Comments: Full active and passive ROM without pain No midline or paraspinal tenderness No nuchal rigidity or meningeal signs Cardiovascular:     Rate and Rhythm: Normal rate and regular rhythm.  Pulmonary:     Effort: Pulmonary effort is normal. No respiratory distress.     Breath sounds: Normal breath sounds. No wheezing or rales.  Abdominal:     General: Bowel sounds are normal.     Palpations: Abdomen is soft.     Tenderness: There is no abdominal tenderness. There is no guarding or rebound.  Musculoskeletal: Normal range of motion.  Lymphadenopathy:     Cervical: No cervical adenopathy.  Skin:    General: Skin is warm and dry.     Findings: No rash.  Neurological:     Mental Status: She is alert and oriented to person, place, and time.     Cranial Nerves: No cranial nerve deficit.     Motor: No abnormal muscle tone.     Coordination: Coordination normal.     Comments: Mental Status:  Alert, oriented, thought content appropriate. Speech fluent without evidence of aphasia. Able to follow 2 step  commands without difficulty.  Cranial Nerves:  II:  Peripheral visual fields grossly normal, pupils equal, round, reactive to light III,IV, VI: ptosis not present, extra-ocular motions intact bilaterally  V,VII: smile symmetric, facial light touch sensation equal VIII: hearing grossly normal bilaterally  IX,X: midline uvula rise  XI: bilateral shoulder shrug equal and strong XII: midline tongue extension  Motor:  5/5 in upper and lower extremities bilaterally including strong and equal grip strength and dorsiflexion/plantar flexion Sensory: Pinprick and light touch normal in all extremities.  Cerebellar: normal finger-to-nose with bilateral upper extremities Gait: normal gait and balance CV: distal pulses palpable throughout   Psychiatric:        Behavior: Behavior normal.        Thought Content: Thought content normal.        Judgment: Judgment normal.      ED Treatments / Results  Labs (all labs ordered are listed, but only abnormal results are displayed) Labs Reviewed  BASIC METABOLIC PANEL - Abnormal; Notable for the following components:      Result Value   Glucose, Bld 105 (*)    Creatinine, Ser 1.07 (*)    GFR calc non Af Amer 58 (*)    All other components within normal limits  CBC - Abnormal; Notable for the following components:   Platelets 411 (*)    All other components within normal limits  HEPATIC FUNCTION PANEL  LIPASE, BLOOD  I-STAT TROPONIN, ED    EKG EKG Interpretation  Date/Time:  Monday May 29 2018 23:56:27 EST Ventricular Rate:  75 PR Interval:  198 QRS Duration: 80 QT Interval:  392 QTC Calculation: 437 R Axis:   17 Text Interpretation:  Normal sinus rhythm  Normal ECG No significant change since last tracing Confirmed by Ripley Fraise [phone]) on 05/30/2018 12:33:07 AM     Radiology Dg Chest 2 View  Result Date: 05/29/2018 CLINICAL DATA:  Hypertension and chest pain on and off. EXAM: CHEST - 2 VIEW COMPARISON:  None. FINDINGS:  The heart size and mediastinal contours are within normal limits. Both lungs are clear. Partially included pedicle screws at L3. Acute nor suspicious osseous abnormality. Degenerative change along the thoracic spine. IMPRESSION: No active cardiopulmonary disease. Electronically Signed   By: Ashley Royalty M.D.   On: 05/29/2018 23:46   Ct Head Wo Contrast  Result Date: 05/30/2018 CLINICAL DATA:  Acute onset of severe headache. Nausea and vomiting. High blood pressure. EXAM: CT HEAD WITHOUT CONTRAST TECHNIQUE: Contiguous axial images were obtained from the base of the skull through the vertex without intravenous contrast. COMPARISON:  CT of the head performed 03/04/2015 FINDINGS: Brain: No evidence of acute infarction, hemorrhage, hydrocephalus, extra-axial collection or mass lesion/mass effect. Mild subcortical white matter change is noted at the right frontal lobe. The posterior fossa, including the cerebellum, brainstem and fourth ventricle, is within normal limits. The third and lateral ventricles, and basal ganglia are unremarkable in appearance. The cerebral hemispheres are symmetric in appearance, with normal gray-white differentiation. No mass effect or midline shift is seen. Vascular: No hyperdense vessel or unexpected calcification. Skull: There is no evidence of fracture; visualized osseous structures are unremarkable in appearance. Sinuses/Orbits: The orbits are within normal limits. The paranasal sinuses and mastoid air cells are well-aerated. Other: No significant soft tissue abnormalities are seen. IMPRESSION: 1. No acute intracranial pathology seen on CT. 2. Mild subcortical white matter change at the right frontal lobe, grossly stable from 2016. Electronically Signed   By: Garald Balding M.D.   On: 05/30/2018 02:00    Procedures Procedures (including critical care time)  Medications Ordered in ED Medications  alum & mag hydroxide-simeth (MAALOX/MYLANTA) 200-200-20 MG/5ML suspension 30 mL  (has no administration in time range)  sodium chloride 0.9 % bolus 1,000 mL (0 mLs Intravenous Stopped 05/30/18 0204)  hydrALAZINE (APRESOLINE) injection 10 mg (10 mg Intravenous Given 05/30/18 0210)  aspirin chewable tablet 324 mg (324 mg Oral Given 05/30/18 0211)  ondansetron (ZOFRAN) injection 4 mg (4 mg Intravenous Given 05/30/18 0210)  ketorolac (TORADOL) 30 MG/ML injection 30 mg (30 mg Intravenous Given 05/30/18 0254)  metoCLOPramide (REGLAN) injection 10 mg (10 mg Intravenous Given 05/30/18 0254)  diphenhydrAMINE (BENADRYL) injection 12.5 mg (12.5 mg Intravenous Given 05/30/18 0254)     Initial Impression / Assessment and Plan / ED Course  I have reviewed the triage vital signs and the nursing notes.  Pertinent labs & imaging results that were available during my care of the patient were reviewed by me and considered in my medical decision making (see chart for details).  Clinical Course as of May 31 415  Tue May 30, 2018  0200 Pt remains hypertensive with headache and chest pressure.  Will give hydralazine  BP(!): 183/116 [HM]  0204 Pt reports she feels lightheaded after walking to the bathroom and then vomited again.  Emesis is NBNB   [HM]  2426 Pt reports improved headache, but no resolution.  Pt reports complete resolution of chest pain.  She has some burning in her throat from the emesis.  She reports she is tired and ready for discharge.     [HM]    Clinical Course User Index [HM] Kathleen Tamm, Gwenlyn Perking    Presents with hypertension,  headache, chest pressure, nausea and vomiting.  Normal neurologic exam on my evaluation.  EKG is without acute ischemia.  Chest x-ray without evidence of pulmonary edema, pneumothorax or pneumonia.  Labs are reassuring and initial troponin is negative.  We will give fluids, obtain CT scan and treat headache.  Vitals:   05/30/18 0300 05/30/18 0315 05/30/18 0345 05/30/18 0400  BP:  (!) 157/96 (!) 151/85 (!) 148/96  Pulse:  98 98 95    Resp: 18 (!) 24 (!) 22 14  Temp:      TempSrc:      SpO2:  100% 100% 97%  Weight:      Height:       CT scan without acute abnormality including no cranial hemorrhage.  Patient ambulates with steady gait in the emergency department. cLabs are reassuring without evidence of endorgan damage.  Slightly elevated creatinine however this appears to be baseline.    4:17 AM Pt reports her headache is some better and her chest pain is resolved.  No evidence of meningitis, no nuchal rigidity.  No fever.  Troponin negative, EKG without ischemia.  Highly doubt acute coronary syndrome.  Patient's blood pressure has decreased significantly.  No evidence of hypertensive emergency at this time.  Patient wishes for discharge home.  I feel this is reasonable however I have given strict return precautions for new or persistent symptoms.  Patient states understanding and is in agreement with this plan.  The patient was discussed with and evaluated by Dr. Christy Gentles who agrees with the treatment plan.   Final Clinical Impressions(s) / ED Diagnoses   Final diagnoses:  Hypertension, unspecified type  Generalized headache  Non-intractable vomiting with nausea, unspecified vomiting type  Central chest pain    ED Discharge Orders    None       Loni Muse Gwenlyn Perking 05/30/18 Lucile Shutters, MD 05/30/18 [phone]

## 2018-05-30 NOTE — Discharge Instructions (Addendum)
1. Medications: usual home medications -your metoprolol at your regularly scheduled time 2. Treatment: rest, drink plenty of fluids 3. Follow Up: Please followup with your primary doctor in 2 days for discussion of your diagnoses and further evaluation after today's visit; if you do not have a primary care doctor use the resource guide provided to find one; Please return to the ER for chest pain, persistent headache, vision changes, persistent vomiting or any other concerns.

## 2018-05-30 NOTE — ED Notes (Addendum)
Pt ambulatory to and from hallway bathroom with only standby assist. Pt had incr'd nausea with retching after return to room.

## 2018-06-06 ENCOUNTER — Encounter: Payer: Self-pay | Admitting: Internal Medicine

## 2018-06-06 ENCOUNTER — Ambulatory Visit (AMBULATORY_SURGERY_CENTER): Payer: Medicaid Other | Admitting: Internal Medicine

## 2018-06-06 VITALS — BP 145/91 | HR 74 | Temp 98.0°F | Resp 10 | Ht 64.0 in | Wt 153.0 lb

## 2018-06-06 DIAGNOSIS — Z86010 Personal history of colonic polyps: Secondary | ICD-10-CM

## 2018-06-06 DIAGNOSIS — D123 Benign neoplasm of transverse colon: Secondary | ICD-10-CM

## 2018-06-06 DIAGNOSIS — R531 Weakness: Secondary | ICD-10-CM

## 2018-06-06 MED ORDER — FAMOTIDINE 20 MG PO TABS: 20.0000 mg | ORAL_TABLET | Freq: Two times a day (BID) | ORAL | 1 refills | Status: DC

## 2018-06-06 MED ORDER — SODIUM CHLORIDE 0.9 % IV SOLN: 500.0000 mL | Freq: Once | INTRAVENOUS | Status: DC

## 2018-06-06 NOTE — Op Note (Signed)
[place] Patient Name: [name] Rowe Procedure Date: 06/06/2018 1:46 PM MRN: [id number] Endoscopist: Gatha Mayer , MD Age: 57 Referring MD:  Date of Birth: June 21, 1960 Gender: Female Account #: [id number] Procedure:                Colonoscopy Indications:              High risk colon cancer surveillance: Personal                            history of sessile serrated colon polyp (less than                            10 mm in size) with no dysplasia, Last colonoscopy:                            2014 Medicines:                Propofol per Anesthesia, Monitored Anesthesia Care Procedure:                Pre-Anesthesia Assessment:                           - Prior to the procedure, a History and Physical                            was performed, and patient medications and                            allergies were reviewed. The patient's tolerance of                            previous anesthesia was also reviewed. The risks                            and benefits of the procedure and the sedation                            options and risks were discussed with the patient.                            All questions were answered, and informed consent                            was obtained. Prior Anticoagulants: The patient has                            taken no previous anticoagulant or antiplatelet                            agents. ASA Grade Assessment: III - A patient with                            severe systemic disease. After reviewing the risks  and benefits, the patient was deemed in                            satisfactory condition to undergo the procedure.                           After obtaining informed consent, the colonoscope                            was passed under direct vision. Throughout the                            procedure, the patient's blood pressure, pulse, and                            oxygen saturations were  monitored continuously. The                            Colonoscope was introduced through the anus and                            advanced to the the cecum, identified by                            appendiceal orifice and ileocecal valve. The                            colonoscopy was performed without difficulty. The                            patient tolerated the procedure well. The quality                            of the bowel preparation was excellent. The                            ileocecal valve, appendiceal orifice, and rectum                            were photographed. Scope In: 2:07:05 PM Scope Out: 2:20:13 PM Scope Withdrawal Time: 0 hours 10 minutes 14 seconds  Total Procedure Duration: 0 hours 13 minutes 8 seconds  Findings:                 The perianal and digital rectal examinations were                            normal.                           A diminutive polyp was found in the transverse                            colon. The polyp was flat. The polyp was removed  with a cold snare. Resection and retrieval were                            complete. Verification of patient identification                            for the specimen was done. Estimated blood loss was                            minimal.                           The exam was otherwise without abnormality on                            direct and retroflexion views. Complications:            No immediate complications. Estimated Blood Loss:     Estimated blood loss was minimal. Impression:               - One diminutive polyp in the transverse colon,                            removed with a cold snare. Resected and retrieved.                           - The examination was otherwise normal on direct                            and retroflexion views.                           - Personal history of colonic polyp sessile                            serrated polyp  2014. Recommendation:           - Patient has a contact number available for                            emergencies. The signs and symptoms of potential                            delayed complications were discussed with the                            patient. Return to normal activities tomorrow.                            Written discharge instructions were provided to the                            patient.                           - Resume previous diet.                           -  Continue present medications.                           - Repeat colonoscopy is recommended. The                            colonoscopy date will be determined after pathology                            results from today's exam become available for                            review.                           - Having heartburn sxs                           GERD diet                           Famotidine 20 mg bid x 2 mos                           f/u if persistent problems Gatha Mayer, MD 06/06/2018 2:31:40 PM This report has been signed electronically.

## 2018-06-06 NOTE — Patient Instructions (Addendum)
I found one tiny colon polyp and removed it - like last time.  I will let you know pathology results and when to have another routine colonoscopy by mail and/or My Chart.   For heartburn please be sure to follow a GERD or reflux diet - we will give you a handout. I prescribed famotidine to take twice a day for 2 months. After that if still having problems then make an appointment with my office or discuss with primary care provider.  I appreciate the opportunity to care for you. Gatha Mayer, MD, [facility]   Information on polyps and GERD  given to you today.  Await pathology results.  YOU HAD AN ENDOSCOPIC PROCEDURE TODAY AT [facility]:   Refer to the procedure report that was given to you for any specific questions about what was found during the examination.  If the procedure report does not answer your questions, please call your gastroenterologist to clarify.  If you requested that your care partner not be given the details of your procedure findings, then the procedure report has been included in a sealed envelope for you to review at your convenience later.  YOU SHOULD EXPECT: Some feelings of bloating in the abdomen. Passage of more gas than usual.  Walking can help get rid of the air that was put into your GI tract during the procedure and reduce the bloating. If you had a lower endoscopy (such as a colonoscopy or flexible sigmoidoscopy) you may notice spotting of blood in your stool or on the toilet paper. If you underwent a bowel prep for your procedure, you may not have a normal bowel movement for a few days.  Please Note:  You might notice some irritation and congestion in your nose or some drainage.  This is from the oxygen used during your procedure.  There is no need for concern and it should clear up in a day or so.  SYMPTOMS TO REPORT IMMEDIATELY:   Following lower endoscopy (colonoscopy or flexible sigmoidoscopy):  Excessive amounts of blood in  the stool  Significant tenderness or worsening of abdominal pains  Swelling of the abdomen that is new, acute  Fever of 100F or higher   For urgent or emergent issues, a gastroenterologist can be reached at any hour by calling [phone].   DIET:  We do recommend a small meal at first, but then you may proceed to your regular diet.  Drink plenty of fluids but you should avoid alcoholic beverages for 24 hours.  ACTIVITY:  You should plan to take it easy for the rest of today and you should NOT DRIVE or use heavy machinery until tomorrow (because of the sedation medicines used during the test).    FOLLOW UP: Our staff will call the number listed on your records the next business day following your procedure to check on you and address any questions or concerns that you may have regarding the information given to you following your procedure. If we do not reach you, we will leave a message.  However, if you are feeling well and you are not experiencing any problems, there is no need to return our call.  We will assume that you have returned to your regular daily activities without incident.  If any biopsies were taken you will be contacted by phone or by letter within the next 1-3 weeks.  Please call us at [phone] if you have not heard about the biopsies in 3 weeks.  SIGNATURES/CONFIDENTIALITY: You and/or your care partner have signed paperwork which will be entered into your electronic medical record.  These signatures attest to the fact that that the information above on your After Visit Summary has been reviewed and is understood.  Full responsibility of the confidentiality of this discharge information lies with you and/or your care-partner.

## 2018-06-06 NOTE — Progress Notes (Signed)
Pt's states no medical or surgical changes since previsit or office visit.Pt's states no medical or surgical changes since previsit or office visit.  Pt verbalize last Monday going to ER because her blood pressure was 211/ 126 treated and release in the ER. Has a appointment with primary care 06-19-18. Cardiologist on 06/25/18.  Dr. Carlean Purl and J. Monday CRNA aware of patient recent visit to ER.

## 2018-06-06 NOTE — Progress Notes (Signed)
Called to room to assist during endoscopic procedure.  Patient ID and intended procedure confirmed with present staff. Received instructions for my participation in the procedure from the performing physician.  

## 2018-06-06 NOTE — Progress Notes (Signed)
Report to PACU, RN, vss, BBS= Clear.  

## 2018-06-08 ENCOUNTER — Telehealth: Payer: Self-pay

## 2018-06-08 NOTE — Telephone Encounter (Signed)
  Follow up Call-  Call back number 06/06/2018  Post procedure Call Back phone  # [phone]  Permission to leave phone message Yes  Some recent data might be hidden     Patient questions:  Do you have a fever, pain , or abdominal swelling? No. Pain Score  0 *  Have you tolerated food without any problems? Yes.    Have you been able to return to your normal activities? Yes.    Do you have any questions about your discharge instructions: Diet   No. Medications  No. Follow up visit  No.  Do you have questions or concerns about your Care? No.  Actions: * If pain score is 4 or above: No action needed, pain <4.

## 2018-06-08 NOTE — Telephone Encounter (Signed)
  Follow up Call-  Call back number 06/06/2018  Post procedure Call Back phone  # [phone]  Permission to leave phone message Yes  Some recent data might be hidden     Patient questions:  Do you have a fever, pain , or abdominal swelling? No. Pain Score  0 *  Have you tolerated food without any problems? Yes.    Have you been able to return to your normal activities? Yes.    Do you have any questions about your discharge instructions: Diet   No. Medications  No. Follow up visit  No.  Do you have questions or concerns about your Care? No.  Actions: * If pain score is 4 or above: No action needed, pain <4.

## 2018-06-14 ENCOUNTER — Encounter: Payer: Self-pay | Admitting: Internal Medicine

## 2018-06-14 NOTE — Progress Notes (Signed)
Benign mucosal polyp Hx subcm ssp 2014 Recall 7 years My Chart

## 2018-06-19 ENCOUNTER — Ambulatory Visit (INDEPENDENT_AMBULATORY_CARE_PROVIDER_SITE_OTHER): Payer: Medicaid Other | Admitting: Cardiology

## 2018-06-19 ENCOUNTER — Encounter: Payer: Self-pay | Admitting: Cardiology

## 2018-06-19 ENCOUNTER — Other Ambulatory Visit: Payer: Self-pay | Admitting: Cardiology

## 2018-06-19 VITALS — BP 138/94 | HR 72 | Ht 64.0 in | Wt 153.2 lb

## 2018-06-19 DIAGNOSIS — R079 Chest pain, unspecified: Secondary | ICD-10-CM

## 2018-06-19 DIAGNOSIS — Z9189 Other specified personal risk factors, not elsewhere classified: Secondary | ICD-10-CM | POA: Diagnosis not present

## 2018-06-19 DIAGNOSIS — I773 Arterial fibromuscular dysplasia: Secondary | ICD-10-CM

## 2018-06-19 DIAGNOSIS — I2510 Atherosclerotic heart disease of native coronary artery without angina pectoris: Secondary | ICD-10-CM | POA: Diagnosis not present

## 2018-06-19 DIAGNOSIS — I10 Essential (primary) hypertension: Secondary | ICD-10-CM | POA: Diagnosis not present

## 2018-06-19 DIAGNOSIS — E785 Hyperlipidemia, unspecified: Secondary | ICD-10-CM | POA: Diagnosis not present

## 2018-06-19 DIAGNOSIS — Z8673 Personal history of transient ischemic attack (TIA), and cerebral infarction without residual deficits: Secondary | ICD-10-CM

## 2018-06-19 LAB — LIPID PANEL
Chol/HDL Ratio: 6.9 ratio — ABNORMAL HIGH (ref 0.0–4.4)
Cholesterol, Total: 295 mg/dL — ABNORMAL HIGH (ref 100–199)
HDL: 43 mg/dL (ref >39)
LDL Calculated: 212 mg/dL — ABNORMAL HIGH (ref 0–99)
Triglycerides: 198 mg/dL — ABNORMAL HIGH (ref 0–149)
VLDL Cholesterol Cal: 40 mg/dL (ref 5–40)

## 2018-06-19 MED ORDER — NITROGLYCERIN 0.4 MG SL SUBL: 0.4000 mg | SUBLINGUAL_TABLET | SUBLINGUAL | 3 refills | Status: DC | PRN

## 2018-06-19 MED ORDER — AMLODIPINE BESYLATE 2.5 MG PO TABS: 2.5000 mg | ORAL_TABLET | Freq: Every day | ORAL | 3 refills | Status: DC

## 2018-06-19 NOTE — Progress Notes (Signed)
[birth year] [name] Rowe   [birth year]  [id number]  Primary Physician Everrett Coombe, MD Primary Cardiologist: Dr Oval Linsey  HPI:  58 y/o F with hx of HTN, CVA 2006, fibromuscular dysplasia/RAS s/p RA stent 2009,  cath in 2009 in [place] showing no angiographic CAD, but evidence of catheter induced RCA spasm, and cath in 2016 showing 40-50% LAD. She has had chronic, intermittent chest. that has been felt due to possibly be due to coronary vasospasm vs microsvascular obstruction. She was in the hospital in March 2018 with chest pain that was felt to be atypical. Her Troponin were negative and her echo showed normal LVF. She has had problems with palpitations. Her medications were adjusted by Dr Oval Linsey in Nov 2018 and this seemed to improve. There was concern she had sleep apnea but a sleep study Jan 2019 was negative. She has had hyperlipidemia but was intolerant to Lipitor (elbow and knee pain) and she says Medicare wouldn't pay for Crestor which she did tolerate.   Since we saw her last she was in the ED 05/29/18 with accelerated HTN after she missed one dose of Toprol. It also came ou that she had misplaced her Amlodipine and wasn't taking it.  She tells me she found it last night, the bottle had fallen under her bed.  She has occasional sharp, brief chest pain that's not exertional but no symptoms that sound like angina.    Current Outpatient Medications  Medication Sig Dispense Refill  . amLODipine (NORVASC) 2.5 MG tablet Take 1 tablet (2.5 mg total) by mouth daily. 90 tablet 3  . famotidine (PEPCID) 20 MG tablet Take 1 tablet (20 mg total) by mouth 2 (two) times daily. 60 tablet 1  . hydrOXYzine (ATARAX/VISTARIL) 25 MG tablet Take 25 mg by mouth every 4 (four) hours as needed for anxiety.     Marland Kitchen levothyroxine (SYNTHROID, LEVOTHROID) 150 MCG tablet TAKE 1 TABLET BY MOUTH ONCE DAILY 90 tablet 0  . linaclotide (LINZESS) 290 MCG CAPS capsule Take 1 capsule (290 mcg total) by mouth daily. 30  capsule 11  . metoprolol succinate (TOPROL-XL) 100 MG 24 hr tablet Take 1 tablet (100 mg total) by mouth at bedtime. ** DO NOT CRUSH **    (BETA BLOCKER)  Needs appointment 60 tablet 0  . metoprolol tartrate (LOPRESSOR) 25 MG tablet 1/2 TABLET BY MOUTH AS NEEDED FOR PALPITATIONS (Patient taking differently: Take 12.5 mg by mouth daily as needed (Palpitations). ) 15 tablet 3  . nitroGLYCERIN (NITROSTAT) 0.4 MG SL tablet Place 1 tablet (0.4 mg total) under the tongue every 5 (five) minutes as needed. x3 doses as needed for chest pain 25 tablet 3  . QUEtiapine (SEROQUEL) 200 MG tablet Take 200 mg by mouth at bedtime.     . sertraline (ZOLOFT) 100 MG tablet Take 1 tablet (100 mg total) by mouth daily. 30 tablet 3  . traZODone (DESYREL) 150 MG tablet Take 150 mg by mouth at bedtime.      No current facility-administered medications for this visit.     Allergies  Allergen Reactions  . Hydromorphone Hcl Other (See Comments)    Palpitations  . Prochlorperazine Edisylate Other (See Comments)    Causes Seizures   . Sumatriptan Palpitations  . Atorvastatin Other (See Comments)    Joint pain  . Tramadol Anxiety    Past Medical History:  Diagnosis Date  . Adenomyosis   . Anginal pain ([place])   . Anxiety 2001  . Chest pain  a. No angiographic CAD by cath 2009 in [place]. There was catheter-induced RCA vasospasm versus microvascular obstruction. bCarlton Adam myoview 04/2010 - EF 79%, normal perfusion.;  c. ETT-Myoview 7/13: no ischemia, EF 77%, submax exercise  . Chronic back pain    had 3 epidural injections on 02-14-13  . Chronic kidney disease   . Complication of anesthesia    has difficulty voiding after surgery  . Coronary vasospasm ([place])   . CVA (cerebral vascular accident) ([place]) 2006   lost hearing on left  . Depression 2001  . Dysrhythmia   . Fibromuscular dysplasia ([place]) 2009   a. Renal artery stenosis due to this. b. Carotid dopplers 07/2011 c/w FMD but no significant stenosis    . Fibromyalgia 1999  . H/O Graves' disease 2003   s/p radio-iodine treatment in 2003   . Hearing loss of left ear    s/p CVA 2006  . History of stent insertion of renal artery 2009   right renal stent  . HLD (hyperlipidemia) 2000  . HTN (hypertension) 1988   a. Likely related to RAS; urinary metanephrines and plasma renin/aldosterone ratio normal. b. H/o HTN urgency 06/2011 after being off BP meds for a number of days  . Hx of echocardiogram    a. Echo (1/13): EF 54-27%, grade 1 diastolic dysfunction  . Hypertensive crisis 06/29/2011  . Hypothyroidism 2003   Graves disease s/p radio-iodine treatment in 2003   . Irritable bowel syndrome   . Leiomyoma   . Migraine headache   . Myocardial infarction ([place]) 2009  . Personal history of colonic polyps-sessile serrated adenoma 02/23/2013  . Renal artery stenosis ([place])    a. Due to fibromuscular dysplasia. b. Renal stent 2009. c. Studies:  CTA abdomen (1/13) with evidence for fibromuscular dysplasia, right renal artery stent appears patent. Renal artery dopplers (2/13) with FMD but no evidence for signficant stenosis.   . Tachycardia 2000   10/27/10-Holter -NSR with occ sinus tachy-rare PACs, no sig arrhythmia    Social History   Socioeconomic History  . Marital status: Divorced    Spouse name: Not on file  . Number of children: 2  . Years of education: Not on file  . Highest education level: Not on file  Occupational History  . Occupation: worked in a Development worker, international aid.    Employer: UNEMPLOYED    Comment: Quit about a yr ago because fo work related injury to left knee  Social Needs  . Financial resource strain: Not on file  . Food insecurity:    Worry: Not on file    Inability: Not on file  . Transportation needs:    Medical: Not on file    Non-medical: Not on file  Tobacco Use  . Smoking status: Never Smoker  . Smokeless tobacco: Never Used  Substance and Sexual Activity  . Alcohol use: Not Currently    Comment: rarely  .  Drug use: No  . Sexual activity: Not on file  Lifestyle  . Physical activity:    Days per week: Not on file    Minutes per session: Not on file  . Stress: Not on file  Relationships  . Social connections:    Talks on phone: Not on file    Gets together: Not on file    Attends religious service: Not on file    Active member of club or organization: Not on file    Attends meetings of clubs or organizations: Not on file    Relationship status:  Not on file  . Intimate partner violence:    Fear of current or ex partner: Not on file    Emotionally abused: Not on file    Physically abused: Not on file    Forced sexual activity: Not on file  Other Topics Concern  . Not on file  Social History Narrative   Second Husband is currently in jail for an offense committed in [place] and he is awaiting extradition.   Lives alone.    Jehovah's witness.    Walks 20-30 minutes.                  Family History  Problem Relation Age of Onset  . Hypertension Mother   . Asthma Mother   . Hyperlipidemia Mother   . Stroke Mother   . Hypertension Father   . Prostate cancer Father   . Heart attack Father   . Hyperlipidemia Father   . Stroke Father   . Heart attack Other   . Stroke Other   . Hypertension Brother   . Heart attack Brother 27  . Early death Brother 34       massive stroke   . Hyperlipidemia Brother   . Metabolic syndrome Sister   . Depression Sister   . Hypertension Sister   . Diabetes Maternal Aunt   . Stomach cancer Maternal Aunt   . Stomach cancer Son   . Colon cancer Neg Hx   . Colon polyps Neg Hx   . Esophageal cancer Neg Hx   . Rectal cancer Neg Hx      Review of Systems: General: negative for chills, fever, night sweats or weight changes.  Cardiovascular: negative for chest pain, dyspnea on exertion, edema, orthopnea, palpitations, paroxysmal nocturnal dyspnea or shortness of breath Dermatological: negative for rash Respiratory: negative for cough or  wheezing Urologic: negative for hematuria Abdominal: negative for nausea, vomiting, diarrhea, bright red blood per rectum, melena, or hematemesis Neurologic: negative for visual changes, syncope, or dizziness Negative sleep study Jan 2019 All other systems reviewed and are otherwise negative except as noted above.    Blood pressure (!) 138/94, pulse 72, height 5\' 4"  (1.626 m), weight 153 lb 4 oz (69.5 kg), SpO2 99 %.  General appearance: alert, cooperative and no distress Neck: no carotid bruit and no JVD Lungs: clear to auscultation bilaterally Heart: regular rate and rhythm Extremities: no edema Skin: Skin color, texture, turgor normal. No rashes or lesions Neurologic: Grossly normal   ASSESSMENT AND PLAN:   CAD (coronary artery disease) 40-50% LAD 2016 H/O coronary spasm at cath 2009  Dyslipidemia LDL 187 March 2018- Crestor added  But pt says insurance wouldn't pay for it  Essential hypertension Resume Amlodipine  History of CVA (cerebrovascular accident) Incidental finding of remote lacunar infarct on MIRI  Fibromuscular dysplasia ([place]) History of renal artery stenting in 2009   PLAN  I asked her to resume Amlodipine. Check fasting lipids today- consider referral to lipid clinic.  Kerin Ransom PA-C 06/19/2018 8:17 AM

## 2018-06-19 NOTE — Patient Instructions (Signed)
Medication Instructions:  Your Physician recommend you continue on your current medication as directed.    If you need a refill on your cardiac medications before your next appointment, please call your pharmacy.   Lab work: Your physician recommends that you return for lab work today (Lipid)  If you have labs (blood work) drawn today and your tests are completely normal, you will receive your results only by: Marland Kitchen MyChart Message (if you have MyChart) OR . A paper copy in the mail If you have any lab test that is abnormal or we need to change your treatment, we will call you to review the results.  Testing/Procedures: None  Follow-Up: At [facility], you and your health needs are our priority.  As part of our continuing mission to provide you with exceptional heart care, we have created designated Provider Care Teams.  These Care Teams include your primary Cardiologist (physician) and Advanced Practice Providers (APPs -  Physician Assistants and Nurse Practitioners) who all work together to provide you with the care you need, when you need it. You will need a follow up appointment in 6 months.  Please call our office 2 months in advance to schedule this appointment.  You may see Dr. Oval Linsey or one of the following Advanced Practice Providers on your designated Care Team:   Kerin Ransom, PA-C Roby Lofts, [place] . Sande Rives, PA-C

## 2018-06-19 NOTE — Assessment & Plan Note (Signed)
LDL 187 March 2018- Crestor added  But pt says insurance wouldn't pay for it

## 2018-06-19 NOTE — Assessment & Plan Note (Signed)
Resume Amlodipine

## 2018-06-19 NOTE — Assessment & Plan Note (Signed)
Incidental finding of remote lacunar infarct on MIRI

## 2018-06-19 NOTE — Assessment & Plan Note (Signed)
History of renal artery stenting in 2009

## 2018-06-19 NOTE — Assessment & Plan Note (Signed)
40-50% LAD 2016 H/O coronary spasm at cath 2009

## 2018-06-20 ENCOUNTER — Telehealth: Payer: Self-pay

## 2018-06-20 NOTE — Telephone Encounter (Signed)
Message sent to schedulers. Patient has been notified directly and voiced understanding.

## 2018-06-20 NOTE — Telephone Encounter (Signed)
-----   Message from Erlene Quan, [place] sent at 06/19/2018 12:32 PM EST ----- Please let this patient know Dr Oval Linsey would like her to see one of our pharmacist for hyperlipidemia and arrange that follow up.  Thanks  Kerin Ransom PA-C 06/19/2018 12:36 PM

## 2018-06-21 ENCOUNTER — Telehealth: Payer: Self-pay | Admitting: Cardiovascular Disease

## 2018-06-21 NOTE — Telephone Encounter (Signed)
-----   Message from Harold Hedge, [place] sent at 06/20/2018  3:17 PM EST ----- Regarding: needs pharmacist appt Please contact patient and schedule appt with Pharmacist to discuss hyperlipidemia. Patient aware and expecting a call.  Thanks,  [employer]

## 2018-06-21 NOTE — Telephone Encounter (Signed)
Patient is scheduled to Scheduled to see  Pharmacist on 1/28 at 74 am for hyperlipidemia.

## 2018-07-04 ENCOUNTER — Ambulatory Visit (INDEPENDENT_AMBULATORY_CARE_PROVIDER_SITE_OTHER): Payer: Medicaid Other | Admitting: Pharmacist

## 2018-07-04 VITALS — BP 120/64 | HR 70 | Resp 15 | Wt 150.8 lb

## 2018-07-04 DIAGNOSIS — E785 Hyperlipidemia, unspecified: Secondary | ICD-10-CM

## 2018-07-04 NOTE — Progress Notes (Signed)
Patient ID: [name] Rowe                 DOB: 1960-11-17                    MRN: [id number]     HPI: [name] Rowe is a 58 y.o. female patient referred to lipid clinic by Dr Oval Linsey. PMH is significant for hypertension, CVA 2006, fibromuscular dysplasia, 50% stenosis of LAD on cath performed 2016, coronary vasospasms, strong family history of cardiovascular problems, intolerance to atorvastatin ,and unable to afford rosuvastatin. She is able to tolerate rosuvastatin 40mg  daily but stopped taking after PA expired more than a year ago.   Current Medications: none  Intolerances:  atorvastatin 80mg  daily Atorvastatin 40mg  daily Rosuvastatin 40mg  daily Pravastatin 80mg  daily Pravastatin 40mg  daily simvsatatin 20mg  daily  LDL goal: < 70mg /dL  Diet: low fat, avoid rice, lots of salad, mainly baked   Exercise: activities of daily living  Family History: MI x 2 and stroke in father; stroke in mother at age 2, stroke in brother at 35yo  Social History: no tobacco use, alcohol socially (maybe 2-3 times per year)  Labs: 06/19/2018: CHO 295; TG 198; HDL 43; LDL-c 212 (no medication)  Past Medical History:  Diagnosis Date  . Adenomyosis   . Anginal pain ([place])   . Anxiety 2001  . Chest pain    a. No angiographic CAD by cath 2009 in [place]. There was catheter-induced RCA vasospasm versus microvascular obstruction. bCarlton Adam myoview 04/2010 - EF 79%, normal perfusion.;  c. ETT-Myoview 7/13: no ischemia, EF 77%, submax exercise  . Chronic back pain    had 3 epidural injections on 02-14-13  . Chronic kidney disease   . Complication of anesthesia    has difficulty voiding after surgery  . Coronary vasospasm ([place])   . CVA (cerebral vascular accident) ([place]) 2006   lost hearing on left  . Depression 2001  . Dysrhythmia   . Fibromuscular dysplasia ([place]) 2009   a. Renal artery stenosis due to this. b. Carotid dopplers 07/2011 c/w FMD but no significant stenosis  . Fibromyalgia 1999    . H/O Graves' disease 2003   s/p radio-iodine treatment in 2003   . Hearing loss of left ear    s/p CVA 2006  . History of stent insertion of renal artery 2009   right renal stent  . HLD (hyperlipidemia) 2000  . HTN (hypertension) 1988   a. Likely related to RAS; urinary metanephrines and plasma renin/aldosterone ratio normal. b. H/o HTN urgency 06/2011 after being off BP meds for a number of days  . Hx of echocardiogram    a. Echo (1/13): EF 92-42%, grade 1 diastolic dysfunction  . Hypertensive crisis 06/29/2011  . Hypothyroidism 2003   Graves disease s/p radio-iodine treatment in 2003   . Irritable bowel syndrome   . Leiomyoma   . Migraine headache   . Myocardial infarction ([place]) 2009  . Personal history of colonic polyps-sessile serrated adenoma 02/23/2013  . Renal artery stenosis ([place])    a. Due to fibromuscular dysplasia. b. Renal stent 2009. c. Studies:  CTA abdomen (1/13) with evidence for fibromuscular dysplasia, right renal artery stent appears patent. Renal artery dopplers (2/13) with FMD but no evidence for signficant stenosis.   . Tachycardia 2000   10/27/10-Holter -NSR with occ sinus tachy-rare PACs, no sig arrhythmia    Current Outpatient Medications on File Prior to Visit  Medication Sig Dispense Refill  . amLODipine (NORVASC)  2.5 MG tablet Take 1 tablet (2.5 mg total) by mouth daily. 90 tablet 3  . famotidine (PEPCID) 20 MG tablet Take 1 tablet (20 mg total) by mouth 2 (two) times daily. 60 tablet 1  . hydrOXYzine (ATARAX/VISTARIL) 25 MG tablet Take 25 mg by mouth every 4 (four) hours as needed for anxiety.     Marland Kitchen levothyroxine (SYNTHROID, LEVOTHROID) 150 MCG tablet TAKE 1 TABLET BY MOUTH ONCE DAILY 90 tablet 0  . linaclotide (LINZESS) 290 MCG CAPS capsule Take 1 capsule (290 mcg total) by mouth daily. 30 capsule 11  . metoprolol succinate (TOPROL-XL) 100 MG 24 hr tablet Take 1 tablet (100 mg total) by mouth at bedtime. ** DO NOT CRUSH **    (BETA BLOCKER)  Needs  appointment 60 tablet 0  . metoprolol tartrate (LOPRESSOR) 25 MG tablet 1/2 TABLET BY MOUTH AS NEEDED FOR PALPITATIONS (Patient taking differently: Take 12.5 mg by mouth daily as needed (Palpitations). ) 15 tablet 3  . nitroGLYCERIN (NITROSTAT) 0.4 MG SL tablet Place 1 tablet (0.4 mg total) under the tongue every 5 (five) minutes as needed. x3 doses as needed for chest pain 25 tablet 3  . QUEtiapine (SEROQUEL) 200 MG tablet Take 200 mg by mouth at bedtime.     . sertraline (ZOLOFT) 100 MG tablet Take 1 tablet (100 mg total) by mouth daily. 30 tablet 3  . traZODone (DESYREL) 150 MG tablet Take 150 mg by mouth at bedtime.      No current facility-administered medications on file prior to visit.     Allergies  Allergen Reactions  . Hydromorphone Hcl Other (See Comments)    Palpitations  . Prochlorperazine Edisylate Other (See Comments)    Causes Seizures   . Sumatriptan Palpitations  . Atorvastatin Other (See Comments)    Joint pain  . Tramadol Anxiety    Dyslipidemia LDL-c extremely elevated for secondary prevention. Patient was taking rosuvastatin 40mg  daily but stopped taking after PA expired. We discussed how important is for her to continue lipid management medication due to prior stroke and strong family history.   Will start PA process for rosuvastatin 40mg  daily (Medicaid non-prefer statin) and repeat Lipid panel 2-3 months. If rosuvsatatin DENIED or LDL remains above goal, will start repatha 140mg  every 14 days. Patient agrees with plan and willing to start PCSK9i if needed.    [name] Rowe PharmD, BCPS, [place] [address] 07/05/2018 8:24 AM

## 2018-07-04 NOTE — Patient Instructions (Signed)
Lipid Clinic (pharmacist) [phone]  *START rosuvastatin 40mg  daily as soon as PA approved* *Repeat blood work in 3 months*   Cholesterol  Cholesterol is a fat. Your body needs a small amount of cholesterol. Cholesterol (plaque) may build up in your blood vessels (arteries). That makes you more likely to have a heart attack or stroke. You cannot feel your cholesterol level. Having a blood test is the only way to find out if your level is high. Keep your test results. Work with your doctor to keep your cholesterol at a good level. What do the results mean?  Total cholesterol is how much cholesterol is in your blood.  LDL is bad cholesterol. This is the type that can build up. Try to have low LDL.  HDL is good cholesterol. It cleans your blood vessels and carries LDL away. Try to have high HDL.  Triglycerides are fat that the body can store or burn for energy. What are good levels of cholesterol?  Total cholesterol below 200.  LDL below 100 is good for people who have health risks. LDL below 70 is good for people who have very high risks.  HDL above 40 is good. It is best to have HDL of 60 or higher.  Triglycerides below 150. How can I lower my cholesterol? Diet Follow your diet program as told by your doctor.  Choose fish, white meat chicken, or [place] that is roasted or baked. Try not to eat red meat, fried foods, sausage, or lunch meats.  Eat lots of fresh fruits and vegetables.  Choose whole grains, beans, pasta, potatoes, and cereals.  Choose olive oil, corn oil, or canola oil. Only use small amounts.  Try not to eat butter, mayonnaise, shortening, or palm kernel oils.  Try not to eat foods with trans fats.  Choose low-fat or nonfat dairy foods. ? Drink skim or nonfat milk. ? Eat low-fat or nonfat yogurt and cheeses. ? Try not to drink whole milk or cream. ? Try not to eat ice cream, egg yolks, or full-fat cheeses.  Healthy desserts include angel food cake,  ginger snaps, animal crackers, hard candy, popsicles, and low-fat or nonfat frozen yogurt. Try not to eat pastries, cakes, pies, and cookies.  Exercise Follow your exercise program as told by your doctor.  Be more active. Try gardening, walking, and taking the stairs.  Ask your doctor about ways that you can be more active. Medicine  Take over-the-counter and prescription medicines only as told by your doctor. This information is not intended to replace advice given to you by your health care provider. Make sure you discuss any questions you have with your health care provider. Document Released: 08/20/2008 Document Revised: 12/24/2015 Document Reviewed: 12/04/2015 Elsevier Interactive Patient Education  2019 [employer].

## 2018-07-05 NOTE — Assessment & Plan Note (Addendum)
LDL-c extremely elevated for secondary prevention. Patient was taking rosuvastatin 40mg  daily but stopped taking after PA expired. We discussed how important is for her to continue lipid management medication due to prior stroke and strong family history.   Will start PA process for rosuvastatin 40mg  daily (Medicaid non-prefer statin) and repeat Lipid panel 2-3 months. If rosuvsatatin DENIED or LDL remains above goal, will start repatha 140mg  every 14 days. Patient agrees with plan and willing to start PCSK9i if needed.

## 2018-07-06 ENCOUNTER — Other Ambulatory Visit: Payer: Self-pay | Admitting: Pharmacist

## 2018-07-06 MED ORDER — ROSUVASTATIN CALCIUM 40 MG PO TABS: 40.0000 mg | ORAL_TABLET | Freq: Every day | ORAL | 11 refills | Status: DC

## 2018-07-06 NOTE — Telephone Encounter (Signed)
Medicaid PA for Crestor 40mg  approved.  Rx sent to prefer pharmacy and patient notified.

## 2018-07-14 ENCOUNTER — Other Ambulatory Visit: Payer: Self-pay | Admitting: Cardiovascular Disease

## 2018-07-28 ENCOUNTER — Encounter: Payer: Self-pay | Admitting: Student in an Organized Health Care Education/Training Program

## 2018-08-14 ENCOUNTER — Other Ambulatory Visit: Payer: Self-pay | Admitting: Cardiovascular Disease

## 2018-08-15 NOTE — Telephone Encounter (Signed)
Rx(s) sent to pharmacy electronically.  

## 2018-11-23 ENCOUNTER — Other Ambulatory Visit: Payer: Self-pay | Admitting: Internal Medicine

## 2018-11-28 ENCOUNTER — Telehealth: Payer: Self-pay | Admitting: Cardiovascular Disease

## 2018-11-28 ENCOUNTER — Telehealth: Payer: Self-pay | Admitting: *Deleted

## 2018-11-28 NOTE — Telephone Encounter (Signed)
Confirmed 11/30/18 appointment with [name], DNP---please bring insurance cards, co payment, me3dication listing and mask--no visitors or family members allowed at this time---patient voiced her understanding

## 2018-11-28 NOTE — Telephone Encounter (Signed)
New Message             Pt c/o of Chest Pain: 1. Are you having CP right now? Yesw 2. Are you experiencing any other symptoms (ex. SOB, nausea, vomiting, sweating)? SOB 3. How long have you been experiencing CP? For month 4. Is your CP continuous or coming and going? Comes and goes 5. Have you taken Nitroglycerin? Yes

## 2018-11-28 NOTE — Telephone Encounter (Signed)
Spoke with patient and she has been having intermittent chest pains for about a month. Patient requesting office visit. Chest pains usually only last a few seconds but have lasted longer. At times radiates to back. Scheduled patient appointment with Arnold Long DNP for 11/30/18. Advised patient to avoid strenuous activity until after visit and if chest pain returns without relief call 911. Patient verbalizes understanding.   Per patient she had a COVID 19 mail off test Tuesday and received results of negative yesterday. She denies any cough, fever, or known exposure

## 2018-11-29 NOTE — Progress Notes (Signed)
Cardiology Office Note   Date:  8/54/6270   ID:  [name] Rowe, DOB [birth year], MRN [id number]  PCP:  Everrett Coombe, MD  Cardiologist:  Dr.[place]  CC: Hypertension hypercholesterolemia follow-up   History of Present Illness: [name] Rowe is a 58 y.o. female who presents for ongoing assessment and management of CAD which was found to be nonobstructive per cardiac catheterization in 2016 revealing 40 to 50% LAD, with chronic chest pain thought to be related to vasospasm versus microvascular obstruction.   Other history includes hypertension with admission to the ED on 05/29/2018 with a significantly elevated levels after she missed a dose of Toprol.  She also had misplaced her amlodipine and was not taking that either.  She also has a history of a CVA with remote lacunar infarct on MRI, dyslipidemia, RAS with stenting in 2009.   She was referred to the lipid clinic and was documented as intolerant to atorvastatin, rosuvastatin, pravastatin, and simvastatin.  She was considered to be a candidate for Repatha if restarting rosuvastatin was unsuccessful.  She was in agreement to start PCSK9 inhibition if necessary.  She was seen last by the lipid clinic on 07/04/2018.  The patient states that she had one episode of squeezing chest pain that felt like a "pop" substernally and she felt relief after the "pop", no associated diaphoresis or dizziness but she said she did breathe hard after it happened as it surprised her.  She has been compliant with her medications with the exception of one time, and her blood pressure did go up substantially.  She called her office and was told to take an additional dose of her medications which allowed her blood pressure to normalize.  She continues to have complaints of GERD symptoms and is followed by GI physician.  She is on Pepcid but it does not seem to help her as much.  Past Medical History:  Diagnosis Date  . Adenomyosis   . Anginal pain ([place])   . Anxiety  2001  . Chest pain    a. No angiographic CAD by cath 2009 in [place]. There was catheter-induced RCA vasospasm versus microvascular obstruction. bCarlton Adam myoview 04/2010 - EF 79%, normal perfusion.;  c. ETT-Myoview 7/13: no ischemia, EF 77%, submax exercise  . Chronic back pain    had 3 epidural injections on 02-14-13  . Chronic kidney disease   . Complication of anesthesia    has difficulty voiding after surgery  . Coronary vasospasm ([place])   . CVA (cerebral vascular accident) ([place]) 2006   lost hearing on left  . Depression 2001  . Dysrhythmia   . Fibromuscular dysplasia ([place]) 2009   a. Renal artery stenosis due to this. b. Carotid dopplers 07/2011 c/w FMD but no significant stenosis  . Fibromyalgia 1999  . H/O Graves' disease 2003   s/p radio-iodine treatment in 2003   . Hearing loss of left ear    s/p CVA 2006  . History of stent insertion of renal artery 2009   right renal stent  . HLD (hyperlipidemia) 2000  . HTN (hypertension) 1988   a. Likely related to RAS; urinary metanephrines and plasma renin/aldosterone ratio normal. b. H/o HTN urgency 06/2011 after being off BP meds for a number of days  . Hx of echocardiogram    a. Echo (1/13): EF 69-67%, grade 1 diastolic dysfunction  . Hypertensive crisis 06/29/2011  . Hypothyroidism 2003   Graves disease s/p radio-iodine treatment in 2003   . Irritable bowel syndrome   .  Leiomyoma   . Migraine headache   . Myocardial infarction ([place]) 2009  . Personal history of colonic polyps-sessile serrated adenoma 02/23/2013  . Renal artery stenosis ([place])    a. Due to fibromuscular dysplasia. b. Renal stent 2009. c. Studies:  CTA abdomen (1/13) with evidence for fibromuscular dysplasia, right renal artery stent appears patent. Renal artery dopplers (2/13) with FMD but no evidence for signficant stenosis.   . Tachycardia 2000   10/27/10-Holter -NSR with occ sinus tachy-rare PACs, no sig arrhythmia    Past Surgical History:  Procedure  Laterality Date  . BACK SURGERY  2010 and 2011   In CT.  11/19/2008 (possibly a laminectomy) and in May of 2011 she underwent a L2-L3 fusion.  . cardiac cath    . CARDIAC CATHETERIZATION  2009   states it was normal  . CARDIAC CATHETERIZATION N/A 02/03/2015   Procedure: Left Heart Cath and Coronary Angiography;  Surgeon: Larey Dresser, MD;  Location: [facility];  Service: Cardiovascular;  Laterality: N/A;  . COLONOSCOPY  last 02/16/2013  . COLONOSCOPY    . HIP SURGERY Bilateral 1988 and 1989   "scraped head of femur"  . KNEE ARTHROSCOPY Left   . LUMBAR LAMINECTOMY/DECOMPRESSION MICRODISCECTOMY Left 05/25/2013   Procedure: LUMBAR LAMINECTOMY/DECOMPRESSION MICRODISCECTOMY 1 LEVEL;  Surgeon: Winfield Cunas, MD;  Location: [facility];  Service: Neurosurgery;  Laterality: Left;  LEFT L5S1 microdiskectomy  . POLYPECTOMY    . RENAL ARTERY STENT  2009  . ROBOTIC ASSISTED LAP VAGINAL HYSTERECTOMY  05/13/2008   [facility] hysterectomy  for myomatous uterus   . SPINE SURGERY    . VAGINAL HYSTERECTOMY       Current Outpatient Medications  Medication Sig Dispense Refill  . amLODipine (NORVASC) 2.5 MG tablet Take 1 tablet (2.5 mg total) by mouth daily. 90 tablet 3  . famotidine (PEPCID) 20 MG tablet Take 1 tablet by mouth twice daily 60 tablet 0  . hydrOXYzine (ATARAX/VISTARIL) 25 MG tablet Take 25 mg by mouth every 4 (four) hours as needed for anxiety.     Marland Kitchen levothyroxine (SYNTHROID, LEVOTHROID) 150 MCG tablet TAKE 1 TABLET BY MOUTH ONCE DAILY 90 tablet 0  . linaclotide (LINZESS) 290 MCG CAPS capsule Take 1 capsule (290 mcg total) by mouth daily. 30 capsule 11  . metoprolol succinate (TOPROL-XL) 100 MG 24 hr tablet TAKE 1 TABLET BY MOUTH AT BEDTIME (DO  NOT  CRUSH)  (BETA  BLOCKER)  NEEDS  APPOINTMENT 90 tablet 1  . metoprolol tartrate (LOPRESSOR) 25 MG tablet Take 0.5 tablets (12.5 mg total) by mouth daily as needed (palpitations). TAKE ONE-HALF TABLET BY MOUTH AS NEEDED FOR  PALPITATIONS. 30 tablet 3  . nitroGLYCERIN (NITROSTAT) 0.4 MG SL tablet Place 1 tablet (0.4 mg total) under the tongue every 5 (five) minutes as needed. x3 doses as needed for chest pain 25 tablet 3  . QUEtiapine (SEROQUEL) 200 MG tablet Take 200 mg by mouth at bedtime.     . rosuvastatin (CRESTOR) 40 MG tablet Take 1 tablet (40 mg total) by mouth daily. 30 tablet 11  . sertraline (ZOLOFT) 100 MG tablet Take 1 tablet (100 mg total) by mouth daily. 30 tablet 3  . traZODone (DESYREL) 150 MG tablet Take 150 mg by mouth at bedtime.     . pantoprazole (PROTONIX) 20 MG tablet Take 1 tablet (20 mg total) by mouth daily. 30 tablet 6   No current facility-administered medications for this visit.     Allergies:  Hydromorphone hcl, Prochlorperazine edisylate, Sumatriptan, Atorvastatin, and Tramadol    Social History:  The patient  reports that she has never smoked. She has never used smokeless tobacco. She reports previous alcohol use. She reports that she does not use drugs.   Family History:  The patient's family history includes Asthma in her mother; Depression in her sister; Diabetes in her maternal aunt; Early death (age of onset: 29) in her brother; Heart attack in her father and another family member; Heart attack (age of onset: 25) in her brother; Hyperlipidemia in her brother, father, and mother; Hypertension in her brother, father, mother, and sister; Metabolic syndrome in her sister; Prostate cancer in her father; Stomach cancer in her maternal aunt and son; Stroke in her father, mother, and another family member.    ROS: All other systems are reviewed and negative. Unless otherwise mentioned in H&P    PHYSICAL EXAM: VS:  BP (!) 132/98   Pulse 82   Ht 5\' 4"  (1.626 m)   Wt 156 lb 12.8 oz (71.1 kg)   BMI 26.91 kg/m  , BMI Body mass index is 26.91 kg/m. GEN: Well nourished, well developed, in no acute distress HEENT: normal Neck: no JVD, carotid bruits, or masses Cardiac: RRR; no  murmurs, rubs, or gallops,no edema  Respiratory:  Clear to auscultation bilaterally, normal work of breathing GI: soft, nontender, nondistended, + BS MS: no deformity or atrophy Skin: warm and dry, no rash Neuro:  Strength and sensation are intact Psych: euthymic mood, full affect   EKG:  EKG is ordered today. The ekg ordered today demonstrates normal sinus rhythm heart of 82 bpm with evidence of mild LVH poor R wave progression.   Recent Labs: 05/29/2018: BUN 11; Creatinine, Ser 1.07; Hemoglobin 14.3; Platelets 411; Potassium 4.3; Sodium 139 05/30/2018: ALT 27    Lipid Panel    Component Value Date/Time   CHOL 295 (H) 06/19/2018 0838   TRIG 198 (H) 06/19/2018 0838   HDL 43 06/19/2018 0838   CHOLHDL 6.9 (H) 06/19/2018 0838   CHOLHDL 5.8 08/30/2016 0858   VLDL 40 08/30/2016 0858   LDLCALC 212 (H) 06/19/2018 0838   LDLDIRECT 147.4 06/12/2013 0943      Wt Readings from Last 3 Encounters:  11/30/18 156 lb 12.8 oz (71.1 kg)  07/04/18 150 lb 12.8 oz (68.4 kg)  06/19/18 153 lb 4 oz (69.5 kg)      Other studies Reviewed: Echocardiogram Sep 28, 2016  Left ventricle: The cavity size was normal. Wall thickness was   normal. Systolic function was normal. The estimated ejection   fraction was in the range of 60% to 65%. Wall motion was normal;   there were no regional wall motion abnormalities. Doppler   parameters are consistent with abnormal left ventricular   relaxation (grade 1 diastolic dysfunction). - Pericardium, extracardiac: A trivial pericardial effusion was   identified.  Cardiac Cath 02/03/2015 Left Main  No significant disease.  Left Anterior Descending  There was a 40-50% stenosis in the proximal LAD just distal to the take-off of a moderate D2.  Left Circumflex  No significant disease.  Right Coronary Artery  No significant disease.     ASSESSMENT AND PLAN:  1.  Hypertension: Blood pressure is well controlled when she remembers to take her medicine.  On  rare occasions when she does not remember her blood pressure reflects this becoming very elevated.  I have reinforced the need to be medically compliant and be on a schedule concerning her blood pressure  medications to help her not to forget to take them.  She has no evidence of volume overload at this time.  She will continue the amlodipine and metoprolol as directed.  2.  Substernal chest discomfort: I think that she may be having some esophageal spasms related to GERD.  I will start her on Protonix 20 mg daily but she is advised to follow-up with her GI physician with whom she is already established to consider EGD.  3.  Hypercholesterolemia: So far she is tolerating rosuvastatin 40 mg daily.  I will check fasting lipids and LFTs along with a BMET.  The patient is already in today and therefore she will come back to the office to have labs drawn on a day when she is fasting.   Current medicines are reviewed at length with the patient today.    Labs/ tests ordered today include: Fasting lipids LFTs and BMET Phill Myron. West Pugh, ANP, [facility]   11/30/2018 11:23 AM    [facility] [address] Office [phone] Fax [phone]

## 2018-11-30 ENCOUNTER — Other Ambulatory Visit: Payer: Self-pay

## 2018-11-30 ENCOUNTER — Encounter: Payer: Self-pay | Admitting: Adult Health

## 2018-11-30 ENCOUNTER — Ambulatory Visit (INDEPENDENT_AMBULATORY_CARE_PROVIDER_SITE_OTHER): Payer: Medicaid Other | Admitting: Adult Health

## 2018-11-30 VITALS — BP 132/98 | HR 82 | Ht 64.0 in | Wt 156.8 lb

## 2018-11-30 DIAGNOSIS — Z79899 Other long term (current) drug therapy: Secondary | ICD-10-CM

## 2018-11-30 DIAGNOSIS — I10 Essential (primary) hypertension: Secondary | ICD-10-CM | POA: Diagnosis not present

## 2018-11-30 DIAGNOSIS — K219 Gastro-esophageal reflux disease without esophagitis: Secondary | ICD-10-CM

## 2018-11-30 DIAGNOSIS — E785 Hyperlipidemia, unspecified: Secondary | ICD-10-CM

## 2018-11-30 MED ORDER — PANTOPRAZOLE SODIUM 20 MG PO TBEC: 20.0000 mg | DELAYED_RELEASE_TABLET | Freq: Every day | ORAL | 6 refills | Status: DC

## 2018-11-30 NOTE — Patient Instructions (Signed)
Medication Instructions:  PLEASE START PANTOPRAZOLE 20MG  DAILY. If you need a refill on your cardiac medications before your next appointment, please call your pharmacy.  Labwork: BMET, LFT AND FASTING LIPIDS HERE IN OUR OFFICE AT [facility]    You will need to fast. DO NOT EAT OR DRINK PAST MIDNIGHT.       Take the provided lab slips with you to the lab for your blood draw.   When you have your labs (blood work) drawn today and your tests are completely normal, you will receive your results only by MyChart Message (if you have MyChart) -OR-  A paper copy in the mail.  If you have any lab test that is abnormal or we need to change your treatment, we will call you to review these results.  Special Instructions: PLEASE CALL PCP/GI DOCTORS TO SCHEDULE APPOINTMENTS TO DISCUSS A PLAN FOR YOUR REFLUX  Follow-Up: You will need a follow up appointment in 6 months.  Please call our office 2 months in advance, October 2020, to schedule this, December 2020, appointment.  You may see Skeet Latch, MD, [name], DNP, AACC  or one of the following Advanced Practice Providers on your designated Care Team:  Kerin Ransom, PA-C  Roby Lofts, PA-C  Sande Rives, [place]      At [facility], you and your health needs are our priority.  As part of our continuing mission to provide you with exceptional heart care, we have created designated Provider Care Teams.  These Care Teams include your primary Cardiologist (physician) and Advanced Practice Providers (APPs -  Physician Assistants and Nurse Practitioners) who all work together to provide you with the care you need, when you need it.  Thank you for choosing [facility] at [facility]!!

## 2018-12-01 LAB — BASIC METABOLIC PANEL
BUN/Creatinine Ratio: 16 (ref 9–23)
BUN: 18 mg/dL (ref 6–24)
CO2: 22 mmol/L (ref 20–29)
Calcium: 9.6 mg/dL (ref 8.7–10.2)
Chloride: 102 mmol/L (ref 96–106)
Creatinine, Ser: 1.12 mg/dL — ABNORMAL HIGH (ref 0.57–1.00)
GFR calc Af Amer: 63 mL/min/{1.73_m2} (ref >59)
GFR calc non Af Amer: 55 mL/min/{1.73_m2} — ABNORMAL LOW (ref >59)
Glucose: 98 mg/dL (ref 65–99)
Potassium: 4.7 mmol/L (ref 3.5–5.2)
Sodium: 138 mmol/L (ref 134–144)

## 2018-12-01 LAB — HEPATIC FUNCTION PANEL
ALT: 40 IU/L — ABNORMAL HIGH (ref 0–32)
AST: 29 IU/L (ref 0–40)
Albumin: 4.5 g/dL (ref 3.8–4.9)
Alkaline Phosphatase: 112 IU/L (ref 39–117)
Bilirubin Total: 0.4 mg/dL (ref 0.0–1.2)
Bilirubin, Direct: 0.12 mg/dL (ref 0.00–0.40)
Total Protein: 6.7 g/dL (ref 6.0–8.5)

## 2018-12-01 LAB — LIPID PANEL
Chol/HDL Ratio: 3.4 ratio (ref 0.0–4.4)
Cholesterol, Total: 161 mg/dL (ref 100–199)
HDL: 48 mg/dL (ref >39)
LDL Calculated: 84 mg/dL (ref 0–99)
Triglycerides: 146 mg/dL (ref 0–149)
VLDL Cholesterol Cal: 29 mg/dL (ref 5–40)

## 2018-12-01 NOTE — Addendum Note (Signed)
Addended by: Vennie Homans on: 12/01/2018 12:01 PM   Modules accepted: Orders

## 2018-12-04 ENCOUNTER — Telehealth: Payer: Self-pay | Admitting: Adult Health

## 2018-12-04 NOTE — Telephone Encounter (Signed)
Patient informed of lab results. She is not currently taking praluent injections for her cholesterol levels. Patient confirms that she is taking rosuvastatin 40 mg daily and has been taking it since April 2020. Will have [name], NP advise if further medication changes need to be made.

## 2018-12-04 NOTE — Telephone Encounter (Signed)
Patient returned call for lab results.  

## 2018-12-05 NOTE — Telephone Encounter (Signed)
My mistake. She is to be referred to lipid clinic please for discussion of need for PCKS-9. Inhibitior due to elevated lipids not controlled by creator.

## 2018-12-05 NOTE — Telephone Encounter (Signed)
Please arrange as I am back in the [facility] office today. Thanks!

## 2018-12-05 NOTE — Telephone Encounter (Signed)
MESSAGE SENT TO SCHEDULING 

## 2018-12-05 NOTE — Telephone Encounter (Signed)
Left detailed message of referral to lipid clinic

## 2018-12-06 NOTE — Telephone Encounter (Signed)
Appt schedule 07/27 with Dr Debara Pickett

## 2018-12-29 ENCOUNTER — Telehealth: Payer: Self-pay | Admitting: Internal Medicine

## 2018-12-29 NOTE — Telephone Encounter (Signed)
LVM for pt to call back so she can be pre-screened for COVID-19.

## 2019-01-01 ENCOUNTER — Ambulatory Visit: Payer: Medicaid Other | Admitting: Internal Medicine

## 2019-02-02 ENCOUNTER — Other Ambulatory Visit: Payer: Self-pay | Admitting: Student in an Organized Health Care Education/Training Program

## 2019-02-02 ENCOUNTER — Other Ambulatory Visit: Payer: Self-pay | Admitting: Cardiology

## 2021-11-10 ENCOUNTER — Encounter: Payer: Self-pay | Admitting: *Deleted

## 2022-09-11 ENCOUNTER — Emergency Department (HOSPITAL_COMMUNITY): Payer: Medicaid Other

## 2022-09-11 ENCOUNTER — Emergency Department (HOSPITAL_COMMUNITY)
Admission: EM | Admit: 2022-09-11 | Discharge: 2022-09-11 | Disposition: A | Discharge status: Home / Self Care | Payer: Medicaid Other | Attending: Emergency Medicine | Admitting: Emergency Medicine

## 2022-09-11 ENCOUNTER — Other Ambulatory Visit: Payer: Self-pay

## 2022-09-11 DIAGNOSIS — R0789 Other chest pain: Secondary | ICD-10-CM | POA: Diagnosis not present

## 2022-09-11 DIAGNOSIS — I10 Essential (primary) hypertension: Secondary | ICD-10-CM | POA: Diagnosis not present

## 2022-09-11 DIAGNOSIS — I2510 Atherosclerotic heart disease of native coronary artery without angina pectoris: Secondary | ICD-10-CM

## 2022-09-11 DIAGNOSIS — R079 Chest pain, unspecified: Secondary | ICD-10-CM | POA: Diagnosis present

## 2022-09-11 DIAGNOSIS — R519 Headache, unspecified: Secondary | ICD-10-CM | POA: Insufficient documentation

## 2022-09-11 DIAGNOSIS — R531 Weakness: Secondary | ICD-10-CM

## 2022-09-11 LAB — BASIC METABOLIC PANEL
Anion gap: 9 (ref 5–15)
BUN: 22 mg/dL (ref 8–23)
CO2: 23 mmol/L (ref 22–32)
Calcium: 9.3 mg/dL (ref 8.9–10.3)
Chloride: 106 mmol/L (ref 98–111)
Creatinine, Ser: 1.29 mg/dL — ABNORMAL HIGH (ref 0.44–1.00)
GFR, Estimated: 47 mL/min — ABNORMAL LOW (ref >60)
Glucose, Bld: 113 mg/dL — ABNORMAL HIGH (ref 70–99)
Potassium: 4.1 mmol/L (ref 3.5–5.1)
Sodium: 138 mmol/L (ref 135–145)

## 2022-09-11 LAB — CBC WITH DIFFERENTIAL/PLATELET
Abs Immature Granulocytes: 0.01 10*3/uL (ref 0.00–0.07)
Basophils Absolute: 0 10*3/uL (ref 0.0–0.1)
Basophils Relative: 0 %
Eosinophils Absolute: 0.1 10*3/uL (ref 0.0–0.5)
Eosinophils Relative: 2 %
HCT: 42.8 % (ref 36.0–46.0)
Hemoglobin: 14.7 g/dL (ref 12.0–15.0)
Immature Granulocytes: 0 %
Lymphocytes Relative: 28 %
Lymphs Abs: 1.7 10*3/uL (ref 0.7–4.0)
MCH: 30.6 pg (ref 26.0–34.0)
MCHC: 34.3 g/dL (ref 30.0–36.0)
MCV: 89.2 fL (ref 80.0–100.0)
Monocytes Absolute: 0.4 10*3/uL (ref 0.1–1.0)
Monocytes Relative: 6 %
Neutro Abs: 4 10*3/uL (ref 1.7–7.7)
Neutrophils Relative %: 64 %
Platelets: 274 10*3/uL (ref 150–400)
RBC: 4.8 MIL/uL (ref 3.87–5.11)
RDW: 12.2 % (ref 11.5–15.5)
WBC: 6.2 10*3/uL (ref 4.0–10.5)
nRBC: 0 % (ref 0.0–0.2)

## 2022-09-11 LAB — TROPONIN I (HIGH SENSITIVITY)
Troponin I (High Sensitivity): 4 ng/L (ref <18)
Troponin I (High Sensitivity): 4 ng/L (ref <18)

## 2022-09-11 MED ORDER — IOHEXOL 350 MG/ML SOLN
75.0000 mL | Freq: Once | INTRAVENOUS | Status: AC | PRN
  Administered 2022-09-11: 75 mL via INTRAVENOUS

## 2022-09-11 NOTE — ED Provider Notes (Signed)
Patient was initially seen by Dr. Rhunette Croft.  Please see his note.  Patient was evaluated for chest pressure as well as weakness.  ED workup was reassuring.  Patient was still waiting for an MRI at shift change.  MRI does not show any evidence of any acute abnormality.  There is evidence of a chronic lacunar infarct.  Patient also has some hyperintense signal abnormalities mild but greater than expected age.  Nonspecific but could be related to chronic small vessel disease as well as chronic migraines, demyelinating process.  Referrals to  cardiology ordered.  Patient is already followed by a neurologist affiliated with [facility]   Linwood Dibbles, MD 09/11/22 1723

## 2022-09-11 NOTE — Discharge Instructions (Signed)
You were seen in the emergency room for chest discomfort, upper extremity heaviness, generalized fatigue and weakness.  Her cardiac workup is reassuring.  EKG is normal and the heart enzymes did not show any evidence of heart damage.  We recommend that you get outpatient cardiology follow-up.  The MRI of the brain and the CT angiogram are negative for any stroke, brain bleed or aneurysm.  We recommend that you continue follow-up with your neurologist.

## 2022-09-11 NOTE — ED Provider Notes (Signed)
Waiting on mri, if negative dc

## 2022-09-11 NOTE — ED Notes (Signed)
Back from mri

## 2022-09-11 NOTE — ED Provider Notes (Signed)
[facility] EMERGENCY DEPARTMENT AT [facility] Provider Note   CSN: [id number] Arrival date & time: 09/11/22  1021     History {Add pertinent medical, surgical, social history, OB history to HPI:1} Chief Complaint  Patient presents with   Chest Pain   Weakness   Fatigue    [name] Rowe is a 62 y.o. female.  HPI    62 year old female with history of CAD, stroke, hypertension, hyperlipidemia comes in with chief complaint of chest pain, weakness.  Patient states that yesterday afternoon she started noticing heaviness in both of her extremities and some chest discomfort.  She had similar symptoms in the past in [place] when she was told she had heart attack.  At that time she was not stented.  In 2021 however and while she was in [place], she was having chest discomfort and heaviness that did lead to PCI placement.   Patient is also complaining of dizziness, headache and neck pain.  She has history of stroke with left-sided weakness.  Patient states that she was told that on on her angiogram they saw " holes in her blood vessel".  Patient is back in [place].  She sees a neurologist at [facility]  She is requesting that we give her cardiology follow-up.  Home Medications Prior to Admission medications   Medication Sig Start Date End Date Taking? Authorizing Provider  amLODipine (NORVASC) 2.5 MG tablet Take 1 tablet (2.5 mg total) by mouth daily. 06/19/18   Abelino Derrick, PA-C  famotidine (PEPCID) 20 MG tablet Take 1 tablet by mouth twice daily 11/23/18   Iva Boop, MD  hydrOXYzine (ATARAX/VISTARIL) 25 MG tablet Take 25 mg by mouth every 4 (four) hours as needed for anxiety.     [provider]  levothyroxine (SYNTHROID, LEVOTHROID) 150 MCG tablet TAKE 1 TABLET BY MOUTH ONCE DAILY 11/08/17   Almon Hercules, MD  LINZESS 290 MCG CAPS capsule TAKE 1 CAPSULE BY MOUTH DAILY 02/02/19   Lennox Solders, MD  metoprolol succinate (TOPROL-XL) 100 MG 24 hr  tablet TAKE 1 TABLET BY MOUTH AT BEDTIME (DO  NOT  CRUSH)(BETA  BLOCKER)  NEEDS  APPOINTMENT 02/02/19   Abelino Derrick, PA-C  metoprolol tartrate (LOPRESSOR) 25 MG tablet Take 0.5 tablets (12.5 mg total) by mouth daily as needed (palpitations). TAKE ONE-HALF TABLET BY MOUTH AS NEEDED FOR PALPITATIONS. 08/15/18   Chilton Si, MD  nitroGLYCERIN (NITROSTAT) 0.4 MG SL tablet Place 1 tablet (0.4 mg total) under the tongue every 5 (five) minutes as needed. x3 doses as needed for chest pain 06/19/18   Corine Shelter K, PA-C  pantoprazole (PROTONIX) 20 MG tablet Take 1 tablet (20 mg total) by mouth daily. 11/30/18   Jodelle Gross, NP  QUEtiapine (SEROQUEL) 200 MG tablet Take 200 mg by mouth at bedtime.     [provider]  rosuvastatin (CRESTOR) 40 MG tablet Take 1 tablet (40 mg total) by mouth daily. 07/06/18   Chilton Si, MD  sertraline (ZOLOFT) 100 MG tablet Take 1 tablet (100 mg total) by mouth daily. 04/06/17   Almon Hercules, MD  traZODone (DESYREL) 150 MG tablet Take 150 mg by mouth at bedtime.     [provider]      Allergies    Hydromorphone hcl, Prochlorperazine edisylate, Sumatriptan, Atorvastatin, and Tramadol    Review of Systems   Review of Systems  All other systems reviewed and are negative.   Physical Exam Updated Vital Signs BP (!) 147/92  Pulse 64   Temp 98.2 F (36.8 C) (Oral)   Resp 15   Ht 5\' 4"  (1.626 m)   Wt 60.8 kg   SpO2 99%   BMI 23.00 kg/m  Physical Exam Vitals and nursing note reviewed.  Constitutional:      Appearance: She is well-developed.  HENT:     Head: Normocephalic and atraumatic.  Eyes:     Extraocular Movements: Extraocular movements intact.  Cardiovascular:     Rate and Rhythm: Normal rate.  Pulmonary:     Effort: Pulmonary effort is normal.  Musculoskeletal:     Cervical back: Normal range of motion and neck supple.  Skin:    General: Skin is dry.  Neurological:     Mental Status: She is alert and oriented  to person, place, and time.     Motor: Weakness present.     Comments: On exam patient noted to have subtle nasolabial flattening on the right side of the face along with the drift of the right upper extremity and right lower extremity.  Strength in the upper extremity and lower extremity still 4 out of 5.     ED Results / Procedures / Treatments   Labs (all labs ordered are listed, but only abnormal results are displayed) Labs Reviewed  BASIC METABOLIC PANEL - Abnormal; Notable for the following components:      Result Value   Glucose, Bld 113 (*)    Creatinine, Ser 1.29 (*)    GFR, Estimated 47 (*)    All other components within normal limits  CBC WITH DIFFERENTIAL/PLATELET  TROPONIN I (HIGH SENSITIVITY)  TROPONIN I (HIGH SENSITIVITY)    EKG EKG Interpretation  Date/Time:  Saturday September 11 2022 10:34:54 EDT Ventricular Rate:  62 PR Interval:  208 QRS Duration: 82 QT Interval:  397 QTC Calculation: 404 R Axis:   32 Text Interpretation: Sinus rhythm No acute changes No significant change since last tracing Confirmed by Derwood KaplanNanavati, Kimiah Hibner [phone]([id number]) on 09/11/2022 11:52:48 AM  Radiology CT ANGIO HEAD NECK W WO CM  Result Date: 09/11/2022 CLINICAL DATA:  Provided history: Neuro deficit, acute, stroke suspected. EXAM: CT ANGIOGRAPHY HEAD AND NECK TECHNIQUE: Multidetector CT imaging of the head and neck was performed using the standard protocol during bolus administration of intravenous contrast. Multiplanar CT image reconstructions and MIPs were obtained to evaluate the vascular anatomy. Carotid stenosis measurements (when applicable) are obtained utilizing NASCET criteria, using the distal internal carotid diameter as the denominator. RADIATION DOSE REDUCTION: This exam was performed according to the departmental dose-optimization program which includes automated exposure control, adjustment of the mA and/or kV according to patient size and/or use of iterative reconstruction technique.  CONTRAST:  75mL OMNIPAQUE IOHEXOL 350 MG/ML SOLN COMPARISON:  Prior head CT examinations 05/30/2018 and earlier. Report from brain MRI 01/12/2022 (images unavailable). FINDINGS: CT HEAD FINDINGS Brain: Cerebral volume is normal. Mild patchy and ill-defined hypoattenuation within the cerebral white matter, nonspecific. There is no acute intracranial hemorrhage. No demarcated cortical infarct. No extra-axial fluid collection. No evidence of an intracranial mass. No midline shift. Vascular: No hyperdense vessel. Skull: No fracture or aggressive osseous lesion. Sinuses/Orbits: No mass or acute finding within the imaged orbits. 2.2 cm mucous retention cyst within the left maxillary sinus. Review of the MIP images confirms the above findings CTA NECK FINDINGS Aortic arch: Standard aortic branching. Mild atherosclerotic plaque within the visualized aortic arch and proximal major branch vessels of the neck. Streak and beam hardening artifact arising from a dense right-sided  contrast bolus partially obscures the right subclavian artery. Within this limitation, there is no appreciable hemodynamically significant innominate or proximal subclavian artery stenosis. Right carotid system: CCA and ICA patent within the neck without stenosis or significant atherosclerotic disease. Mild nonspecific fusiform dilation of the very distal cervical ICA. Left carotid system: CCA and ICA patent within the neck without stenosis. Minimal atherosclerotic plaque at the CCA origin. Vertebral arteries: Vertebral arteries codominant and patent within the neck without stenosis or significant atherosclerotic disease. Skeleton: Cervical spine findings separately reported on same day cervical spine CT. Other neck: No neck mass or cervical lymphadenopathy. Upper chest: No consolidation within the imaged lung apices. Review of the MIP images confirms the above findings CTA HEAD FINDINGS Anterior circulation: The intracranial internal carotid arteries  are patent. The M1 middle cerebral arteries are patent. No M2 proximal branch occlusion or high-grade proximal stenosis. The anterior cerebral arteries are patent. No intracranial aneurysm is identified. Posterior circulation: The intracranial vertebral arteries are patent. The basilar artery is patent. The posterior cerebral arteries are patent. The right PCA is fetal in origin. The left posterior communicating artery is diminutive or absent. Venous sinuses: No thrombus. Anatomic variants: As described. Review of the MIP images confirms the above findings IMPRESSION: CT head: 1.  No evidence of an acute intracranial abnormality. 2. Mild, nonspecific cerebral white matter disease. 3. 2.2 cm mucous retention cyst within the left maxillary sinus. CTA neck: 1. The common carotid and internal carotid arteries are patent within the neck without stenosis. Minimal atherosclerotic plaque at the left common carotid artery origin. Mild nonspecific fusiform dilation of the very distal cervical right ICA. 2. Vertebral arteries patent within the neck without stenosis or significant atherosclerotic disease. 3.  Aortic Atherosclerosis (ICD10-I70.0). CTA head: No intracranial large vessel occlusion or proximal high-grade arterial stenosis. Electronically Signed   By: Jackey LogeKyle  Golden D.O.   On: 09/11/2022 13:53   CT C-SPINE NO CHARGE  Result Date: 09/11/2022 CLINICAL DATA:  Weakness EXAM: CT CERVICAL SPINE WITHOUT CONTRAST TECHNIQUE: Multidetector CT imaging of the cervical spine was performed without intravenous contrast. Multiplanar CT image reconstructions were also generated. RADIATION DOSE REDUCTION: This exam was performed according to the departmental dose-optimization program which includes automated exposure control, adjustment of the mA and/or kV according to patient size and/or use of iterative reconstruction technique. COMPARISON:  07/30/2022 FINDINGS: Alignment: Facet joints are aligned without dislocation or traumatic  listhesis. Dens and lateral masses are aligned. Skull base and vertebrae: No acute fracture. No primary bone lesion or focal pathologic process. Soft tissues and spinal canal: No prevertebral fluid or swelling. No visible canal hematoma. Disc levels: Mild degenerative disc disease, similar to the previous MRI. Upper chest: Negative. Other: None. IMPRESSION: No acute fracture or traumatic listhesis of the cervical spine. Electronically Signed   By: Duanne GuessNicholas  Plundo D.O.   On: 09/11/2022 13:53    Procedures Procedures  {Document cardiac monitor, telemetry assessment procedure when appropriate:1}  Medications Ordered in ED Medications  iohexol (OMNIPAQUE) 350 MG/ML injection 75 mL (75 mLs Intravenous Contrast Given 09/11/22 1325)    ED Course/ Medical Decision Making/ A&P   {   Click here for ABCD2, HEART and other calculatorsREFRESH Note before signing :1}                          Medical Decision Making Amount and/or Complexity of Data Reviewed Labs: ordered. Radiology: ordered.  Risk Prescription drug management.   62 year old female with  metabolic syndrome history and history of CAD and stroke comes in with chief complaint of bilateral upper extremity weakness, chest discomfort.  On exam she is noted to have right-sided EXTR remedy weakness both upper and lower.  MRI of the brain ordered.  CT angiogram head and neck ordered to ensure there is no evidence of aneurysms, dissection and we will also get CT cervical spine to ensure that there is no evidence of significant arthritis that can explain bilateral upper extremity heaviness.  I have reviewed care everywhere, it appears that patient has seen [facility] in January and she had some similar complaints of upper extremity heaviness and tunnel vision while taking a shower.  She had same type of symptoms last night as well.  She is having atypical chest pain, however with atypical presentation she has had coronary artery disease/ACS.   We will get delta troponin.  If her troponins are negative however, she will be advised to follow-up with outpatient cardiology team.  Final Clinical Impression(s) / ED Diagnoses Final diagnoses:  None    Rx / DC Orders ED Discharge Orders     None

## 2022-09-11 NOTE — ED Triage Notes (Signed)
Pt I started having chest pressure with my arms feeling so weak I, also SOB  I was shopping and felt like I couldn't even pick up something  I feel like Lavenia Atlas ran a marathon.

## 2022-09-11 NOTE — ED Notes (Signed)
ED Provider at bedside. 

## 2022-10-09 NOTE — Progress Notes (Unsigned)
Cardiology Office Note:    Date:  10/12/2022   ID:  [name], DOB [birth year], MRN [id number]  PCP:  Bailey Mech, PA-C   [facility] Providers Cardiologist:  None     Referring MD: Derwood Kaplan, MD   Chief Complaint  Patient presents with   Chest Pain   Coronary Artery Disease    History of Present Illness:    [name] Rowe is a 62 y.o. female seen at the request of Dr Rhunette Croft for evaluation of CAD. She was seen previously in our office by Dr Duke Salvia. Last seen by Joni Reining NP in June 2020. She has a history of prior stroke, hypertension, hyperlipidemia, fibromuscular dysplasia, coronary vasospasm, and inappropriate sinus tachycardia.   She had a stent placed in 2009 for renal artery stenosis/fibromuscular dysplasia.  She underwent a LHC at that time in [place] that showed catheter-induced right coronary spasm.  In 01/2015 she had another cath that reportedly showed 40-50% LAD disease. Stress Myoview in 2015 was normal. Echo in 2018 was unremarkable.   She reports she moved to [place] in 2021. She states she was experiencing a closing up in her throat and a sudden rush to her head. She had a Myoview study indicating anterior ischemia. She underwent cardiac cath in Feb 2022 showing  90% mid LAD stenosis that did not improve with Ntg. She had stenting using a 2.5 x 12 mm Onyx stent. She also had significant catheter induced spasm in the RCA. Her last cardiac evaluation was with a stress Myoview in [facility] in August 2023 which was normal with EF 87%.   She reports she had 3 back surgeries in 2021 and had a stroke following an epidural injection. 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 She moved back to [place] to be near her sister.   She reports that for the last 2-3 months she has felt very tired/exhausted all the time. Worse with exertion. She also has chest tightness that is fairly constant but also worse with exertion.  She does use Ntg spray sometimes but this causes a HA and doesn't really help. Activity is limited. She does get leg pain with exertion. She did have normal upper and lower EGD in Dec. She is a TEFL teacher witness.   Past Medical History:  Diagnosis Date   Adenomyosis    Anginal pain (HCC)    Anxiety 2001   Chest pain    a. No angiographic CAD by cath 2009 in [place]. There was catheter-induced RCA vasospasm versus microvascular obstruction. bEugenie Birks myoview 04/2010 - EF 79%, normal perfusion.;  c. ETT-Myoview 7/13: no ischemia, EF 77%, submax exercise   Chronic back pain    had 3 epidural injections on 02-14-13   Chronic kidney disease    Complication of anesthesia    has difficulty voiding after surgery   Coronary vasospasm (HCC)    CVA (cerebral vascular accident) (HCC) 2006   lost hearing on left   Depression 2001   Dysrhythmia    Fibromuscular dysplasia (HCC) 2009   a. Renal artery stenosis due to this. b. Carotid dopplers 07/2011 c/w FMD but no significant stenosis   Fibromyalgia 1999   H/O Graves' disease 2003   s/p radio-iodine treatment in 2003    Hearing loss of left ear    s/p CVA 2006   History of stent insertion of renal artery 2009   right renal stent   HLD (hyperlipidemia) 2000   HTN (hypertension) 1988  a. Likely related to RAS; urinary metanephrines and plasma renin/aldosterone ratio normal. b. H/o HTN urgency 06/2011 after being off BP meds for a number of days   Hx of echocardiogram    a. Echo (1/13): EF 55-60%, grade 1 diastolic dysfunction   Hypertensive crisis 06/29/2011   Hypothyroidism 2003   Graves disease s/p radio-iodine treatment in 2003    Irritable bowel syndrome    Leiomyoma    Migraine headache    Myocardial infarction [facility]) 2009   Personal history of colonic polyps-sessile serrated adenoma 02/23/2013   Renal artery stenosis (HCC)    a. Due to fibromuscular dysplasia. b. Renal stent 2009. c. Studies:  CTA abdomen (1/13) with evidence for  fibromuscular dysplasia, right renal artery stent appears patent. Renal artery dopplers (2/13) with FMD but no evidence for signficant stenosis.    Tachycardia 2000   10/27/10-Holter -NSR with occ sinus tachy-rare PACs, no sig arrhythmia    Past Surgical History:  Procedure Laterality Date   BACK SURGERY  2010 and 2011   In CT.  11/19/2008 (possibly a laminectomy) and in May of 2011 she underwent a L2-L3 fusion.   cardiac cath     CARDIAC CATHETERIZATION  2009   states it was normal   CARDIAC CATHETERIZATION N/A 02/03/2015   Procedure: Left Heart Cath and Coronary Angiography;  Surgeon: Laurey Morale, MD;  Location: [facility] INVASIVE CV LAB;  Service: Cardiovascular;  Laterality: N/A;   COLONOSCOPY  last 02/16/2013   COLONOSCOPY     HIP SURGERY Bilateral 1988 and 1989   "scraped head of femur"   KNEE ARTHROSCOPY Left    LUMBAR LAMINECTOMY/DECOMPRESSION MICRODISCECTOMY Left 05/25/2013   Procedure: LUMBAR LAMINECTOMY/DECOMPRESSION MICRODISCECTOMY 1 LEVEL;  Surgeon: Carmela Hurt, MD;  Location: [facility] NEURO ORS;  Service: Neurosurgery;  Laterality: Left;  LEFT L5S1 microdiskectomy   POLYPECTOMY     RENAL ARTERY STENT  2009   ROBOTIC ASSISTED LAP VAGINAL HYSTERECTOMY  05/13/2008   [facility] da Vinci hysterectomy  for myomatous uterus    SPINE SURGERY     VAGINAL HYSTERECTOMY      Current Medications: Current Meds  Medication Sig   amLODipine (NORVASC) 2.5 MG tablet Take 1 tablet (2.5 mg total) by mouth daily.   ezetimibe (ZETIA) 10 MG tablet Take 10 mg by mouth daily.   gabapentin (NEURONTIN) 100 MG capsule Take 100 mg by mouth 4 (four) times daily.   hydrOXYzine (ATARAX/VISTARIL) 25 MG tablet Take 25 mg by mouth every 4 (four) hours as needed for anxiety.    levothyroxine (SYNTHROID, LEVOTHROID) 150 MCG tablet TAKE 1 TABLET BY MOUTH ONCE DAILY   LINZESS 290 MCG CAPS capsule TAKE 1 CAPSULE BY MOUTH DAILY   metoprolol succinate (TOPROL-XL) 100 MG 24 hr tablet TAKE 1 TABLET BY MOUTH AT BEDTIME (DO   NOT  CRUSH)(BETA  BLOCKER)  NEEDS  APPOINTMENT   nitroGLYCERIN (NITROSTAT) 0.4 MG SL tablet Place 1 tablet (0.4 mg total) under the tongue every 5 (five) minutes as needed. x3 doses as needed for chest pain   rosuvastatin (CRESTOR) 40 MG tablet Take 1 tablet (40 mg total) by mouth daily.   sertraline (ZOLOFT) 100 MG tablet Take 1 tablet (100 mg total) by mouth daily.   topiramate (TOPAMAX) 100 MG tablet Take 100 mg by mouth daily.   traZODone (DESYREL) 150 MG tablet Take 150 mg by mouth at bedtime.    Vitamin D, Ergocalciferol, (DRISDOL) 1.25 MG (50000 UNIT) CAPS capsule Take by mouth every 7 (seven) days.   [  DISCONTINUED] famotidine (PEPCID) 20 MG tablet Take 1 tablet by mouth twice daily   [DISCONTINUED] metoprolol tartrate (LOPRESSOR) 25 MG tablet Take 0.5 tablets (12.5 mg total) by mouth daily as needed (palpitations). TAKE ONE-HALF TABLET BY MOUTH AS NEEDED FOR PALPITATIONS.   [DISCONTINUED] pantoprazole (PROTONIX) 20 MG tablet Take 1 tablet (20 mg total) by mouth daily.   [DISCONTINUED] QUEtiapine (SEROQUEL) 200 MG tablet Take 200 mg by mouth at bedtime.      Allergies:   Hydromorphone hcl, Prochlorperazine edisylate, Sumatriptan, Atorvastatin, and Tramadol   Social History   Socioeconomic History   Marital status: Divorced    Spouse name: Not on file   Number of children: 2   Years of education: Not on file   Highest education level: Not on file  Occupational History   Occupation: worked in a Museum/gallery conservator.    Employer: UNEMPLOYED    Comment: Quit about a yr ago because fo work related injury to left knee  Tobacco Use   Smoking status: Never   Smokeless tobacco: Never  Vaping Use   Vaping Use: Never used  Substance and Sexual Activity   Alcohol use: Not Currently    Comment: rarely   Drug use: No   Sexual activity: Not on file  Other Topics Concern   Not on file  Social History Narrative   Second Husband is currently in jail for an offense committed in [place]  and he is awaiting extradition.   Lives alone.    Jehovah's witness.    Walks 20-30 minutes.                Social Determinants of Health   Financial Resource Strain: Not on file  Food Insecurity: Not on file  Transportation Needs: Not on file  Physical Activity: Not on file  Stress: Not on file  Social Connections: Not on file     Family History: The patient's family history includes Asthma in her mother; Depression in her sister; Diabetes in her maternal aunt; Early death (age of onset: 14) in her brother; Heart attack in her father and another family member; Heart attack (age of onset: 10) in her brother; Heart disease in her father and mother; Hyperlipidemia in her brother, father, and mother; Hypertension in her brother, father, mother, and sister; Metabolic syndrome in her sister; Prostate cancer in her father; Stomach cancer in her maternal aunt and son; Stroke in her father, mother, and another family member. There is no history of Colon cancer, Colon polyps, Esophageal cancer, or Rectal cancer.  ROS:   Please see the history of present illness.     All other systems reviewed and are negative.  EKGs/Labs/Other Studies Reviewed:    The following studies were reviewed today: Myoview 04/14/14: IMPRESSION: 1. No scintigraphic evidence of prior infarction or pharmacologically induced ischemia.   2. Normal left ventricular wall motion.   3. Left ventricular ejection fraction 79%   4. Low-risk stress test findings*.    Cardiac cath 02/03/15:  Left Heart Cath and Coronary Angiography   Conclusion  1. 40-50% proximal LAD stenosis after take-off of moderate D2.  By itself, I do not think this lesion should be causing her symptoms.  We gave her NTG IA and the lesion did not change.  Superimposed vasoconstriction in this area could certainly cause her symptoms.  2. Radial artery spasm during the procedure necessitated robust sedation and use of intra-arterial NTG.    Would  treat for vasospasm.  She is already  on CCB.  I will add Imdur 15 mg daily to see if she can tolerate a low dose of chronic nitrate (had headache in the past with Imdur).   Echo 08/31/16: Study Conclusions   - Left ventricle: The cavity size was normal. Wall thickness was    normal. Systolic function was normal. The estimated ejection    fraction was in the range of 60% to 65%. Wall motion was normal;    there were no regional wall motion abnormalities. Doppler    parameters are consistent with abnormal left ventricular    relaxation (grade 1 diastolic dysfunction).  - Pericardium, extracardiac: A trivial pericardial effusion was    identified.   Impressions:   - Normal LV systolic function; grade 1 diastolic dysfunction;    mildly dilated ascending aorta (3.8 cm).   Holter monitor 12/14/16: Study Highlights  Minimum HR: 62 BPM at 4:19:32 AM(2) Maximum HR: 147 BPM at 12:01:01 PM Average HR: 92 BPM Sinus rhythm and sinus tachycardia seen Dizziness, palpitations, chest pain associated with sinus tachycardia   Will Camnitz, MD  EKG:  EKG is not  ordered today.  The ekg ordered 09/11/22 demonstrates NSR rate 62. Normal. I have personally reviewed and interpreted this study.   Recent Labs: 09/11/2022: BUN 22; Creatinine, Ser 1.29; Hemoglobin 14.7; Platelets 274; Potassium 4.1; Sodium 138  Recent Lipid Panel    Component Value Date/Time   CHOL 161 12/01/2018 1008   TRIG 146 12/01/2018 1008   HDL 48 12/01/2018 1008   CHOLHDL 3.4 12/01/2018 1008   CHOLHDL 5.8 08/30/2016 0858   VLDL 40 08/30/2016 0858   LDLCALC 84 12/01/2018 1008   LDLDIRECT 147.4 06/12/2013 0943     Risk Assessment/Calculations:                Physical Exam:    VS:  BP 112/88 (BP Location: Left Arm, Patient Position: Sitting, Cuff Size: Normal)   Pulse 66   Ht 5\' 4"  (1.626 m)   Wt 134 lb 12.8 oz (61.1 kg)   SpO2 97%   BMI 23.14 kg/m     Wt Readings from Last 3 Encounters:  10/12/22 134 lb 12.8 oz  (61.1 kg)  09/11/22 134 lb (60.8 kg)  11/30/18 156 lb 12.8 oz (71.1 kg)     GEN:  Well nourished, well developed in no acute distress HEENT: Normal NECK: No JVD; No carotid bruits LYMPHATICS: No lymphadenopathy CARDIAC: RRR, no murmurs, rubs, gallops. Radial and pedal pulses are poor.  RESPIRATORY:  Clear to auscultation without rales, wheezing or rhonchi  ABDOMEN: Soft, non-tender, non-distended MUSCULOSKELETAL:  No edema; No deformity  SKIN: Warm and dry NEUROLOGIC:  Alert and oriented x 3 PSYCHIATRIC:  Normal affect   ASSESSMENT:    1. Coronary artery disease of native artery of native heart with stable angina pectoris (HCC)   2. Pain in both lower extremities   3. Renal artery stenosis (HCC)   4. Arterial fibromuscular dysplasia (HCC)   5. Stage 3a chronic kidney disease (HCC)   6. Hypercholesteremia   7. Primary hypertension    PLAN:    In order of problems listed above:  CAD with prior stent of mid LAD in Feb 2022 with 2.5 x 12 Onyx stent. Has history of catheter induced spasm of RCA on more than one occasion. Now with symptoms of chest tightness similar to prior angina. Also significant fatigue on exertion. I think since these symptoms are recent in onset we need to repeat ischemic evaluation. She  is not able to ambulate adequately. Will schedule for PET CT stress. Continue Rx with ASA, statin, Toprol and amlodipine Leg pain. Poor pulses- will check LE arterial dopplers RAS s/p remote stent. She is normotensive HLD on Crestor and Zetia. Last LDL 66 HTN controlled.  History of CVA.  Fibromuscular dysplasia Jehovah's witness.       Shared Decision Making/Informed Consent The risks [chest pain, shortness of breath, cardiac arrhythmias, dizziness, blood pressure fluctuations, myocardial infarction, stroke/transient ischemic attack, nausea, vomiting, allergic reaction, radiation exposure, metallic taste sensation and life-threatening complications (estimated to be 1 in  10,000)], benefits (risk stratification, diagnosing coronary artery disease, treatment guidance) and alternatives of a cardiac PET stress test were discussed in detail with Ms. Smalls and she agrees to proceed.    Medication Adjustments/Labs and Tests Ordered: Current medicines are reviewed at length with the patient today.  Concerns regarding medicines are outlined above.  Orders Placed This Encounter  Procedures   NM PET CT CARDIAC PERFUSION MULTI W/ABSOLUTE BLOODFLOW   Cardiac Stress Test: Informed Consent Details: Physician/Practitioner Attestation; Transcribe to consent form and obtain patient signature   VAS [place] LOWER EXTREMITY ARTERIAL DUPLEX   No orders of the defined types were placed in this encounter.   Patient Instructions  Medication Instructions:  Continue same medications *If you need a refill on your cardiac medications before your next appointment, please call your pharmacy*   Lab Work: None ordered   Testing/Procedures: Lower Ext Arterial Dopplers  Cardiac Pet will be scheduled after approved by insurance   Follow-Up: At [facility], you and your health needs are our priority.  As part of our continuing mission to provide you with exceptional heart care, we have created designated Provider Care Teams.  These Care Teams include your primary Cardiologist (physician) and Advanced Practice Providers (APPs -  Physician Assistants and Nurse Practitioners) who all work together to provide you with the care you need, when you need it.  We recommend signing up for the patient portal called "MyChart".  Sign up information is provided on this After Visit Summary.  MyChart is used to connect with patients for Virtual Visits (Telemedicine).  Patients are able to view lab/test results, encounter notes, upcoming appointments, etc.  Non-urgent messages can be sent to your provider as well.   To learn more about what you can do with MyChart, go to [email or web address].     Your next appointment:  After test    Provider:  Bernadene Person NP   How to Prepare for Your Cardiac PET/CT Stress Test:  1. Please do not take these medications before your test:   Medications that may interfere with the cardiac pharmacological stress agent (ex. nitrates - including erectile dysfunction medications, isosorbide mononitrate, tamulosin or beta-blockers) the day of the exam. (Erectile dysfunction medication should be held for at least 72 hrs prior to test) Theophylline containing medications for 12 hours. Dipyridamole 48 hours prior to the test. Your remaining medications may be taken with water.  2. Nothing to eat or drink, except water, 3 hours prior to arrival time.   NO caffeine/decaffeinated products, or chocolate 12 hours prior to arrival.  3. NO perfume, cologne or lotion  4. Total time is 1 to 2 hours; you may want to bring reading material for the waiting time.  5. Please report to Radiology at the [facility] Main Entrance 30 minutes early for your test.  [address] [place], [place] [address]    In preparation  for your appointment, medication and supplies will be purchased.  Appointment availability is limited, so if you need to cancel or reschedule, please call the Radiology Department at [phone]  24 hours in advance to avoid a cancellation fee of $100.00  What to Expect After you Arrive:  Once you arrive and check in for your appointment, you will be taken to a preparation room within the Radiology Department.  A technologist or Nurse will obtain your medical history, verify that you are correctly prepped for the exam, and explain the procedure.  Afterwards,  an IV will be started in your arm and electrodes will be placed on your skin for EKG monitoring during the stress portion of the exam. Then you will be escorted to the PET/CT scanner.  There, staff will get you positioned on the scanner and obtain a blood pressure and EKG.  During  the exam, you will continue to be connected to the EKG and blood pressure machines.  A small, safe amount of a radioactive tracer will be injected in your IV to obtain a series of pictures of your heart along with an injection of a stress agent.    After your Exam:  It is recommended that you eat a meal and drink a caffeinated beverage to counter act any effects of the stress agent.  Drink plenty of fluids for the remainder of the day and urinate frequently for the first couple of hours after the exam.  Your doctor will inform you of your test results within 7-10 business days.  For questions about your test or how to prepare for your test, please call: Rockwell Alexandria, Cardiac Imaging Nurse Navigator  Larey Brick, Cardiac Imaging Nurse Navigator Office: [phone]      Signed, Kehlani Vancamp [place], MD  10/12/2022 3:02 PM    [facility]

## 2022-10-12 ENCOUNTER — Encounter: Payer: Self-pay | Admitting: Cardiology

## 2022-10-12 ENCOUNTER — Other Ambulatory Visit (HOSPITAL_COMMUNITY): Payer: Self-pay | Admitting: Cardiology

## 2022-10-12 ENCOUNTER — Ambulatory Visit: Payer: Medicaid Other | Attending: Cardiology | Admitting: Cardiology

## 2022-10-12 VITALS — BP 112/88 | HR 66 | Ht 64.0 in | Wt 134.8 lb

## 2022-10-12 DIAGNOSIS — M79604 Pain in right leg: Secondary | ICD-10-CM | POA: Diagnosis not present

## 2022-10-12 DIAGNOSIS — I25118 Atherosclerotic heart disease of native coronary artery with other forms of angina pectoris: Secondary | ICD-10-CM

## 2022-10-12 DIAGNOSIS — I773 Arterial fibromuscular dysplasia: Secondary | ICD-10-CM | POA: Diagnosis not present

## 2022-10-12 DIAGNOSIS — E7800 Pure hypercholesterolemia, unspecified: Secondary | ICD-10-CM

## 2022-10-12 DIAGNOSIS — I10 Essential (primary) hypertension: Secondary | ICD-10-CM

## 2022-10-12 DIAGNOSIS — I701 Atherosclerosis of renal artery: Secondary | ICD-10-CM

## 2022-10-12 DIAGNOSIS — M79605 Pain in left leg: Secondary | ICD-10-CM

## 2022-10-12 DIAGNOSIS — N1831 Chronic kidney disease, stage 3a: Secondary | ICD-10-CM

## 2022-10-12 DIAGNOSIS — I739 Peripheral vascular disease, unspecified: Secondary | ICD-10-CM

## 2022-10-12 NOTE — Patient Instructions (Addendum)
Medication Instructions:  Continue same medications *If you need a refill on your cardiac medications before your next appointment, please call your pharmacy*   Lab Work: None ordered   Testing/Procedures: Lower Ext Arterial Dopplers  Cardiac Pet will be scheduled after approved by insurance   Follow-Up: At [facility], you and your health needs are our priority.  As part of our continuing mission to provide you with exceptional heart care, we have created designated Provider Care Teams.  These Care Teams include your primary Cardiologist (physician) and Advanced Practice Providers (APPs -  Physician Assistants and Nurse Practitioners) who all work together to provide you with the care you need, when you need it.  We recommend signing up for the patient portal called "MyChart".  Sign up information is provided on this After Visit Summary.  MyChart is used to connect with patients for Virtual Visits (Telemedicine).  Patients are able to view lab/test results, encounter notes, upcoming appointments, etc.  Non-urgent messages can be sent to your provider as well.   To learn more about what you can do with MyChart, go to [email or web address].    Your next appointment:  After test    Provider:  Bernadene Person NP   How to Prepare for Your Cardiac PET/CT Stress Test:  1. Please do not take these medications before your test:   Medications that may interfere with the cardiac pharmacological stress agent (ex. nitrates - including erectile dysfunction medications, isosorbide mononitrate, tamulosin or beta-blockers) the day of the exam. (Erectile dysfunction medication should be held for at least 72 hrs prior to test) Theophylline containing medications for 12 hours. Dipyridamole 48 hours prior to the test. Your remaining medications may be taken with water.  2. Nothing to eat or drink, except water, 3 hours prior to arrival time.   NO caffeine/decaffeinated products, or chocolate  12 hours prior to arrival.  3. NO perfume, cologne or lotion  4. Total time is 1 to 2 hours; you may want to bring reading material for the waiting time.  5. Please report to Radiology at the [facility] Main Entrance 30 minutes early for your test.  [address] [place], [place] [address]    In preparation for your appointment, medication and supplies will be purchased.  Appointment availability is limited, so if you need to cancel or reschedule, please call the Radiology Department at [phone]  24 hours in advance to avoid a cancellation fee of $100.00  What to Expect After you Arrive:  Once you arrive and check in for your appointment, you will be taken to a preparation room within the Radiology Department.  A technologist or Nurse will obtain your medical history, verify that you are correctly prepped for the exam, and explain the procedure.  Afterwards,  an IV will be started in your arm and electrodes will be placed on your skin for EKG monitoring during the stress portion of the exam. Then you will be escorted to the PET/CT scanner.  There, staff will get you positioned on the scanner and obtain a blood pressure and EKG.  During the exam, you will continue to be connected to the EKG and blood pressure machines.  A small, safe amount of a radioactive tracer will be injected in your IV to obtain a series of pictures of your heart along with an injection of a stress agent.    After your Exam:  It is recommended that you eat a meal and drink a caffeinated beverage  to counter act any effects of the stress agent.  Drink plenty of fluids for the remainder of the day and urinate frequently for the first couple of hours after the exam.  Your doctor will inform you of your test results within 7-10 business days.  For questions about your test or how to prepare for your test, please call: Rockwell Alexandria, Cardiac Imaging Nurse Navigator  Larey Brick, Cardiac Imaging Nurse  Navigator Office: [phone]

## 2022-10-15 ENCOUNTER — Ambulatory Visit (HOSPITAL_COMMUNITY)
Admission: RE | Admit: 2022-10-15 | Discharge: 2022-10-15 | Disposition: A | Discharge status: Home / Self Care | Payer: Medicaid Other | Source: Ambulatory Visit | Attending: Cardiology | Admitting: Cardiology

## 2022-10-15 DIAGNOSIS — I25118 Atherosclerotic heart disease of native coronary artery with other forms of angina pectoris: Secondary | ICD-10-CM

## 2022-10-15 DIAGNOSIS — I773 Arterial fibromuscular dysplasia: Secondary | ICD-10-CM | POA: Diagnosis present

## 2022-10-15 DIAGNOSIS — I739 Peripheral vascular disease, unspecified: Secondary | ICD-10-CM | POA: Diagnosis present

## 2022-10-15 DIAGNOSIS — I701 Atherosclerosis of renal artery: Secondary | ICD-10-CM | POA: Insufficient documentation

## 2022-10-15 DIAGNOSIS — N1831 Chronic kidney disease, stage 3a: Secondary | ICD-10-CM | POA: Insufficient documentation

## 2022-10-15 DIAGNOSIS — I10 Essential (primary) hypertension: Secondary | ICD-10-CM | POA: Diagnosis present

## 2022-10-15 DIAGNOSIS — M79605 Pain in left leg: Secondary | ICD-10-CM | POA: Insufficient documentation

## 2022-10-15 DIAGNOSIS — M79604 Pain in right leg: Secondary | ICD-10-CM | POA: Diagnosis present

## 2022-10-15 DIAGNOSIS — E7800 Pure hypercholesterolemia, unspecified: Secondary | ICD-10-CM | POA: Insufficient documentation

## 2022-10-15 LAB — VAS US ABI WITH/WO TBI
Left ABI: 1.06
Right ABI: 1.14

## 2022-12-01 ENCOUNTER — Ambulatory Visit: Payer: Medicaid Other | Attending: Nurse Practitioner | Admitting: Nurse Practitioner

## 2022-12-01 NOTE — Progress Notes (Deleted)
Office Visit    Patient Name: [name] Rowe Date of Encounter: 12/01/2022  Primary Care Provider:  Bailey Mech, PA-C Primary Cardiologist:  Peter Swaziland, MD  Chief Complaint    62 year old female with a history of CAD s/p DES-mLAD in 2022, coronary vasospasm, inappropriate sinus tachycardia, hypertension, hyperlipidemia, CVA, renal artery stenosis, CKD stage IIIa, and fibromuscular dysplasia who presents for follow-up related to CAD.  Past Medical History    Past Medical History:  Diagnosis Date   Adenomyosis    Anginal pain (HCC)    Anxiety 2001   Chest pain    a. No angiographic CAD by cath 2009 in [place]. There was catheter-induced RCA vasospasm versus microvascular obstruction. bEugenie Birks myoview 04/2010 - EF 79%, normal perfusion.;  c. ETT-Myoview 7/13: no ischemia, EF 77%, submax exercise   Chronic back pain    had 3 epidural injections on 02-14-13   Chronic kidney disease    Complication of anesthesia    has difficulty voiding after surgery   Coronary vasospasm (HCC)    CVA (cerebral vascular accident) (HCC) 2006   lost hearing on left   Depression 2001   Dysrhythmia    Fibromuscular dysplasia (HCC) 2009   a. Renal artery stenosis due to this. b. Carotid dopplers 07/2011 c/w FMD but no significant stenosis   Fibromyalgia 1999   H/O Graves' disease 2003   s/p radio-iodine treatment in 2003    Hearing loss of left ear    s/p CVA 2006   History of stent insertion of renal artery 2009   right renal stent   HLD (hyperlipidemia) 2000   HTN (hypertension) 1988   a. Likely related to RAS; urinary metanephrines and plasma renin/aldosterone ratio normal. b. H/o HTN urgency 06/2011 after being off BP meds for a number of days   Hx of echocardiogram    a. Echo (1/13): EF 55-60%, grade 1 diastolic dysfunction   Hypertensive crisis 06/29/2011   Hypothyroidism 2003   Graves disease s/p radio-iodine treatment in 2003    Irritable bowel syndrome     Leiomyoma    Migraine headache    Myocardial infarction [facility]) 2009   Personal history of colonic polyps-sessile serrated adenoma 02/23/2013   Renal artery stenosis (HCC)    a. Due to fibromuscular dysplasia. b. Renal stent 2009. c. Studies:  CTA abdomen (1/13) with evidence for fibromuscular dysplasia, right renal artery stent appears patent. Renal artery dopplers (2/13) with FMD but no evidence for signficant stenosis.    Tachycardia 2000   10/27/10-Holter -NSR with occ sinus tachy-rare PACs, no sig arrhythmia   Past Surgical History:  Procedure Laterality Date   BACK SURGERY  2010 and 2011   In CT.  11/19/2008 (possibly a laminectomy) and in May of 2011 she underwent a L2-L3 fusion.   cardiac cath     CARDIAC CATHETERIZATION  2009   states it was normal   CARDIAC CATHETERIZATION N/A 02/03/2015   Procedure: Left Heart Cath and Coronary Angiography;  Surgeon: Laurey Morale, MD;  Location: [facility] INVASIVE CV LAB;  Service: Cardiovascular;  Laterality: N/A;   COLONOSCOPY  last 02/16/2013   COLONOSCOPY     HIP SURGERY Bilateral 1988 and 1989   "scraped head of femur"   KNEE ARTHROSCOPY Left    LUMBAR LAMINECTOMY/DECOMPRESSION MICRODISCECTOMY Left 05/25/2013   Procedure: LUMBAR LAMINECTOMY/DECOMPRESSION MICRODISCECTOMY 1 LEVEL;  Surgeon: Carmela Hurt, MD;  Location: [facility] NEURO ORS;  Service: Neurosurgery;  Laterality: Left;  LEFT L5S1 microdiskectomy  POLYPECTOMY     RENAL ARTERY STENT  2009   ROBOTIC ASSISTED LAP VAGINAL HYSTERECTOMY  05/13/2008   [facility] Vinc[facility] hysterectomy  for myomatous uterus    SPINE SURGERY     VAGINAL HYSTERECTOMY      Allergies  Allergies  Allergen Reactions   Hydromorphone Hcl Other (See Comments)    Palpitations   Prochlorperazine Edisylate Other (See Comments)    Causes Seizures    Sumatriptan Palpitations   Atorvastatin Other (See Comments)    Joint pain   Tramadol Anxiety     Labs/Other Studies Reviewed    The following studies were reviewed  today:  Cardiac Studies & Procedures   CARDIAC CATHETERIZATION  CARDIAC CATHETERIZATION 02/03/2015  Narrative 1. 40-50% proximal LAD stenosis after take-off of moderate D2.  By itself, I do not think this lesion should be causing her symptoms.  We gave her NTG IA and the lesion did not change.  Superimposed vasoconstriction in this area could certainly cause her symptoms. 2. Radial artery spasm during the procedure necessitated robust sedation and use of intra-arterial NTG.  Would treat for vasospasm.  She is already on CCB.  I will add Imdur 15 mg daily to see if she can tolerate a low dose of chronic nitrate (had headache in the past with Imdur).  Findings Coronary Findings Diagnostic  Dominance: Right  Left Main No significant disease.  Left Anterior Descending There was a 40-50% stenosis in the proximal LAD just distal to the take-off of a moderate D2.  Left Circumflex No significant disease.  Right Coronary Artery No significant disease.  Intervention  No interventions have been documented.   STRESS TESTS  NM MYOCAR MULTI W/SPECT W 04/14/2014  Narrative CLINICAL DATA:  Chest pain, tachycardia, hyperlipidemia, hypertension, fibromuscular dysplasia, coronary vasospasm, CVA  EXAM: MYOCARDIAL IMAGING WITH SPECT (REST AND EXERCISE)  GATED LEFT VENTRICULAR WALL MOTION STUDY  LEFT VENTRICULAR EJECTION FRACTION  TECHNIQUE: Standard myocardial SPECT imaging was performed after resting intravenous injection of 10 mCi Tc-29m sestamibi. Subsequently, exercise tolerance test was performed by the patient under the supervision of the Cardiology staff. At peak-stress, 30 mCi Tc-50m sestamibi was injected intravenously and standard myocardial SPECT imaging was performed. Quantitative gated imaging was also performed to evaluate left ventricular wall motion, and estimate left ventricular ejection fraction.  COMPARISON:  Chest radiograph -04/11/2014; chest CT-  04/12/2014  FINDINGS: Were all Ing: No significant patient motion artifact or chest wall attenuation.  Perfusion: There is homogeneous distribution of injected radiotracer without matched or mismatched defect to suggest prior infarction or pharmacologically induced ischemia.  Wall Motion: Normal left ventricular wall motion. No left ventricular dilation.  Left Ventricular Ejection Fraction: 79 %  End diastolic volume 46 ml  End systolic volume 10 ml  IMPRESSION: 1. No scintigraphic evidence of prior infarction or pharmacologically induced ischemia.  2. Normal left ventricular wall motion.  3. Left ventricular ejection fraction 79%  4. Low-risk stress test findings*.  *2012 Appropriate Use Criteria for Coronary Revascularization Focused Update: J Am Coll Cardiol. 2012;59(9):857-881. http://content.dementiazones.com.aspx?articleid=1201161   Electronically Signed By: Simonne Come M.D. On: 04/14/2014 13:01   ECHOCARDIOGRAM  ECHOCARDIOGRAM COMPLETE 08/31/2016  Narrative *[place]* *[facility]* [address] [place], [place] [address] [phone]  ------------------------------------------------------------------- Transthoracic Echocardiography  Patient:    Tajha, Sammarco MR #:       [id number] Study Date: 08/31/2016 Gender:     F Age:        55 Height:  162.6 cm Weight:     66.9 kg BSA:        1.75 m^2 Pt. Status: Room:       3W23C  ATTENDING    Zadie Rhine [id number] SONOGRAPHER  Delcie Roch, RDCS, CCT ADMITTING    Pollie Meyer, [place], [place] J PERFORMING   [facility], Inpatient  cc:  ------------------------------------------------------------------- LV EF: 60% -   65%  ------------------------------------------------------------------- Indications:      Chest pain 786.51.  ------------------------------------------------------------------- History:   Risk factors:  History of stroke.  Hypertension.  ------------------------------------------------------------------- Study Conclusions  - Left ventricle: The cavity size was normal. Wall thickness was normal. Systolic function was normal. The estimated ejection fraction was in the range of 60% to 65%. Wall motion was normal; there were no regional wall motion abnormalities. Doppler parameters are consistent with abnormal left ventricular relaxation (grade 1 diastolic dysfunction). - Pericardium, extracardiac: A trivial pericardial effusion was identified.  Impressions:  - Normal LV systolic function; grade 1 diastolic dysfunction; mildly dilated ascending aorta (3.8 cm).  ------------------------------------------------------------------- Study data:  Comparison was made to the study of 06/29/2011.  Study status:  Routine.  Procedure:  The patient reported no pain pre or post test. Transthoracic echocardiography. Image quality was fair. Study completion:  There were no complications. Transthoracic echocardiography.  M-mode, complete 2D, spectral Doppler, and color Doppler.  Birthdate:  Patient birthdate: 05-26-1961.  Age:  Patient is 61 yr old.  Sex:  Gender: female. BMI: 25.3 kg/m^2.  Blood pressure:     152/94  Patient status: Inpatient.  Study date:  Study date: 08/31/2016. Study time: 03:58 PM.  Location:  Bedside.  -------------------------------------------------------------------  ------------------------------------------------------------------- Left ventricle:  The cavity size was normal. Wall thickness was normal. Systolic function was normal. The estimated ejection fraction was in the range of 60% to 65%. Wall motion was normal; there were no regional wall motion abnormalities. Doppler parameters are consistent with abnormal left ventricular relaxation (grade 1 diastolic dysfunction).  ------------------------------------------------------------------- Aortic valve:   Trileaflet; normal thickness  leaflets. Mobility was not restricted.  Doppler:  Transvalvular velocity was within the normal range. There was no stenosis. There was no regurgitation.  ------------------------------------------------------------------- Aorta:  Aortic root: The aortic root was normal in size.  ------------------------------------------------------------------- Mitral valve:   Structurally normal valve.   Mobility was not restricted.  Doppler:  Transvalvular velocity was within the normal range. There was no evidence for stenosis. There was no regurgitation.    Peak gradient (D): 3 mm Hg.  ------------------------------------------------------------------- Left atrium:  The atrium was normal in size.  ------------------------------------------------------------------- Right ventricle:  The cavity size was normal. Systolic function was normal.  ------------------------------------------------------------------- Pulmonic valve:    Doppler:  Transvalvular velocity was within the normal range. There was no evidence for stenosis.  ------------------------------------------------------------------- Tricuspid valve:   Structurally normal valve.    Doppler: Transvalvular velocity was within the normal range. There was trivial regurgitation.  ------------------------------------------------------------------- Right atrium:  The atrium was normal in size.  ------------------------------------------------------------------- Pericardium:  A trivial pericardial effusion was identified.  ------------------------------------------------------------------- Systemic veins: Inferior vena cava: The vessel was normal in size.  ------------------------------------------------------------------- Measurements  Left ventricle                         Value        Reference LV ID, ED, PLAX chordal        (L)     39.3  mm     43 -  52 LV ID, ES, PLAX chordal                27.3  mm     23 - 38 LV fx shortening,  PLAX chordal         31    %      >=29 LV PW thickness, ED                    9.26  mm     ---------- IVS/LV PW ratio, ED                    0.92         <=1.3 Stroke volume, 2D                      54    ml     ---------- Stroke volume/bsa, 2D                  31    ml/m^2 ---------- LV e&', lateral                         4.12  cm/s   ---------- LV E/e&', lateral                       19.9         ---------- LV e&', medial                          5.22  cm/s   ---------- LV E/e&', medial                        15.71        ---------- LV e&', average                         4.67  cm/s   ---------- LV E/e&', average                       17.56        ----------  Ventricular septum                     Value        Reference IVS thickness, ED                      8.56  mm     ----------  LVOT                                   Value        Reference LVOT ID, S                             19    mm     ---------- LVOT area                              2.84  cm^2   ---------- LVOT peak velocity, S                  88.3  cm/s   ---------- LVOT mean  velocity, S                  60.5  cm/s   ---------- LVOT VTI, S                            19    cm     ----------  Aorta                                  Value        Reference Aortic root ID, ED                     29    mm     ----------  Left atrium                            Value        Reference LA ID, A-P, ES                         33    mm     ---------- LA ID/bsa, A-P                         1.88  cm/m^2 <=2.2 LA volume, S                           41.4  ml     ---------- LA volume/bsa, S                       23.6  ml/m^2 ---------- LA volume, ES, 1-p A4C                 42.5  ml     ---------- LA volume/bsa, ES, 1-p A4C             24.3  ml/m^2 ---------- LA volume, ES, 1-p A2C                 39.6  ml     ---------- LA volume/bsa, ES, 1-p A2C             22.6  ml/m^2 ----------  Mitral valve                           Value         Reference Mitral E-wave peak velocity            82    cm/s   ---------- Mitral A-wave peak velocity            127   cm/s   ---------- Mitral peak gradient, D                3     mm Hg  ---------- Mitral E/A ratio, peak                 0.6          ----------  Right atrium                           Value        Reference RA ID,  S-I, ES, A4C                    43.6  mm     34 - 49 RA area, ES, A4C                       10.8  cm^2   8.3 - 19.5 RA volume, ES, A/L                     22.3  ml     ---------- RA volume/bsa, ES, A/L                 12.7  ml/m^2 ----------  Systemic veins                         Value        Reference Estimated CVP                          3     mm Hg  ----------  Right ventricle                        Value        Reference TAPSE                                  24.8  mm     ---------- RV s&', lateral, S                      14.3  cm/s   ----------  Legend: (L)  and  (H)  mark values outside specified reference range.  ------------------------------------------------------------------- Prepared and Electronically Authenticated by  Olga Millers 2018-03-27T16:56:01            Recent Labs: 09/11/2022: BUN 22; Creatinine, Ser 1.29; Hemoglobin 14.7; Platelets 274; Potassium 4.1; Sodium 138  Recent Lipid Panel    Component Value Date/Time   CHOL 161 12/01/2018 1008   TRIG 146 12/01/2018 1008   HDL 48 12/01/2018 1008   CHOLHDL 3.4 12/01/2018 1008   CHOLHDL 5.8 08/30/2016 0858   VLDL 40 08/30/2016 0858   LDLCALC 84 12/01/2018 1008   LDLDIRECT 147.4 06/12/2013 0943    History of Present Illness    62 year old female with with the above past medical history including CAD s/p DES-mLAD in 2022, coronary vasospasm, inappropriate sinus tachycardia, hypertension, hyperlipidemia, CVA, renal artery stenosis, CKD stage IIIa, and fibromuscular dysplasia.  Previously followed by Dr. Duke Salvia.  Of note, she is a Scientist, product/process development.  She has a history of renal  artery stenosis with fibromuscular dysplasia s/p stent placement in 2009.  Cardiac catheterization at the time showed catheter induced right coronary spasm.  Repeat cardiac catheterization in 2016 reportedly showed 40 to 50% LAD stenosis.  Stress Myoview in 2015 was normal.  Echo in 2018 was unremarkable.  Most recent cardiac catheterization February 2022 showed 90% mid LAD stenosis s/p DES.  She was noted to have significant catheter induced spasm in the RCA.  Stress Myoview in [facility] in August 2023 was normal, EF 87%.  She was last seen in the office on 10/12/2022 and noted a 2 to 66-month history of significant fatigue, chest tightness with exertion.  She also noted bilateral leg pain and was noted to have  diminished pulses.  ABIs were unremarkable.  Cardiac PET stress test was ordered and is pending.  She presents today for follow-up.  Since her last visit  CAD/coronary vasospasm: Inappropriate sinus tachycardia: Hypertension: Hyperlipidemia: CKD stage IIIa: History of renal artery stenosis: History of CVA: Bilateral leg pain: Disposition:  Home Medications    Current Outpatient Medications  Medication Sig Dispense Refill   amLODipine (NORVASC) 2.5 MG tablet Take 1 tablet (2.5 mg total) by mouth daily. 90 tablet 3   ezetimibe (ZETIA) 10 MG tablet Take 10 mg by mouth daily.     gabapentin (NEURONTIN) 100 MG capsule Take 100 mg by mouth 4 (four) times daily.     hydrOXYzine (ATARAX/VISTARIL) 25 MG tablet Take 25 mg by mouth every 4 (four) hours as needed for anxiety.      levothyroxine (SYNTHROID, LEVOTHROID) 150 MCG tablet TAKE 1 TABLET BY MOUTH ONCE DAILY 90 tablet 0   LINZESS 290 MCG CAPS capsule TAKE 1 CAPSULE BY MOUTH DAILY 30 capsule 2   metoprolol succinate (TOPROL-XL) 100 MG 24 hr tablet TAKE 1 TABLET BY MOUTH AT BEDTIME (DO  NOT  CRUSH)(BETA  BLOCKER)  NEEDS  APPOINTMENT 30 tablet 0   nitroGLYCERIN (NITROSTAT) 0.4 MG SL tablet Place 1 tablet (0.4 mg total) under the tongue every  5 (five) minutes as needed. x3 doses as needed for chest pain 25 tablet 3   rosuvastatin (CRESTOR) 40 MG tablet Take 1 tablet (40 mg total) by mouth daily. 30 tablet 11   sertraline (ZOLOFT) 100 MG tablet Take 1 tablet (100 mg total) by mouth daily. 30 tablet 3   topiramate (TOPAMAX) 100 MG tablet Take 100 mg by mouth daily.     traZODone (DESYREL) 150 MG tablet Take 150 mg by mouth at bedtime.      Vitamin D, Ergocalciferol, (DRISDOL) 1.25 MG (50000 UNIT) CAPS capsule Take by mouth every 7 (seven) days.     No current facility-administered medications for this visit.     Review of Systems    ***.  All other systems reviewed and are otherwise negative except as noted above.    Physical Exam    VS:  There were no vitals taken for this visit. , BMI There is no height or weight on file to calculate BMI.     GEN: Well nourished, well developed, in no acute distress. HEENT: normal. Neck: Supple, no JVD, carotid bruits, or masses. Cardiac: RRR, no murmurs, rubs, or gallops. No clubbing, cyanosis, edema.  Radials/DP/PT 2+ and equal bilaterally.  Respiratory:  Respirations regular and unlabored, clear to auscultation bilaterally. GI: Soft, nontender, nondistended, BS + x 4. MS: no deformity or atrophy. Skin: warm and dry, no rash. Neuro:  Strength and sensation are intact. Psych: Normal affect.  Accessory Clinical Findings    ECG personally reviewed by me today -    - no acute changes.   Lab Results  Component Value Date   WBC 6.2 09/11/2022   HGB 14.7 09/11/2022   HCT 42.8 09/11/2022   MCV 89.2 09/11/2022   PLT 274 09/11/2022   Lab Results  Component Value Date   CREATININE 1.29 (H) 09/11/2022   BUN 22 09/11/2022   NA 138 09/11/2022   K 4.1 09/11/2022   CL 106 09/11/2022   CO2 23 09/11/2022   Lab Results  Component Value Date   ALT 40 (H) 12/01/2018   AST 29 12/01/2018   ALKPHOS 112 12/01/2018   BILITOT 0.4 12/01/2018   Lab Results  Component Value Date   CHOL 161  12/01/2018   HDL 48 12/01/2018   LDLCALC 84 12/01/2018   LDLDIRECT 147.4 06/12/2013   TRIG 146 12/01/2018   CHOLHDL 3.4 12/01/2018    Lab Results  Component Value Date   HGBA1C 5.4 08/30/2016    Assessment & Plan    1.  ***  No BP recorded.  {Refresh Note OR Click here to enter BP  :1}***   Joylene Grapes, NP 12/01/2022, 6:09 AM

## 2022-12-31 ENCOUNTER — Telehealth (HOSPITAL_COMMUNITY): Payer: Self-pay | Admitting: *Deleted

## 2022-12-31 NOTE — Telephone Encounter (Signed)
Patient returning call about her upcoming cardiac imaging study; pt verbalizes understanding of appt date/time, parking situation and where to check in, pre-test NPO status and verified current allergies; name and call back number provided for further questions should they arise  Merle Prescott RN Navigator Cardiac Imaging [facility] [phone] office [phone] cell  Patient aware to avoid caffeine 12 hours prior to her cardiac PET scan. 

## 2022-12-31 NOTE — Telephone Encounter (Signed)
Attempted to call patient regarding upcoming cardiac PET appointment. Left message on voicemail with name and callback number  Larey Brick RN Navigator Cardiac Imaging [facility] [phone] Office [phone] Cell  Reminder to avoid caffeine 12 hours prior to her cardiac PET scan.

## 2023-01-04 ENCOUNTER — Ambulatory Visit (HOSPITAL_COMMUNITY)
Admission: RE | Admit: 2023-01-04 | Discharge: 2023-01-04 | Disposition: A | Discharge status: Home / Self Care | Payer: Medicaid Other | Source: Ambulatory Visit | Attending: Cardiology | Admitting: Cardiology

## 2023-01-04 DIAGNOSIS — N1831 Chronic kidney disease, stage 3a: Secondary | ICD-10-CM | POA: Diagnosis present

## 2023-01-04 DIAGNOSIS — E7800 Pure hypercholesterolemia, unspecified: Secondary | ICD-10-CM

## 2023-01-04 DIAGNOSIS — I701 Atherosclerosis of renal artery: Secondary | ICD-10-CM

## 2023-01-04 DIAGNOSIS — I10 Essential (primary) hypertension: Secondary | ICD-10-CM

## 2023-01-04 DIAGNOSIS — M79604 Pain in right leg: Secondary | ICD-10-CM | POA: Diagnosis present

## 2023-01-04 DIAGNOSIS — I773 Arterial fibromuscular dysplasia: Secondary | ICD-10-CM

## 2023-01-04 DIAGNOSIS — I25118 Atherosclerotic heart disease of native coronary artery with other forms of angina pectoris: Secondary | ICD-10-CM

## 2023-01-04 DIAGNOSIS — M79605 Pain in left leg: Secondary | ICD-10-CM | POA: Insufficient documentation

## 2023-01-04 MED ORDER — RUBIDIUM RB82 GENERATOR (RUBYFILL)
25.0000 | PACK | Freq: Once | INTRAVENOUS | Status: AC
  Administered 2023-01-04: 15.87 via INTRAVENOUS

## 2023-01-04 MED ORDER — REGADENOSON 0.4 MG/5ML IV SOLN
0.4000 mg | Freq: Once | INTRAVENOUS | Status: AC
  Administered 2023-01-04: 0.4 mg via INTRAVENOUS

## 2023-01-04 MED ORDER — REGADENOSON 0.4 MG/5ML IV SOLN
INTRAVENOUS | Status: AC
  Filled 2023-01-04: qty 5

## 2023-01-04 MED ORDER — RUBIDIUM RB82 GENERATOR (RUBYFILL)
25.0000 | PACK | Freq: Once | INTRAVENOUS | Status: AC
  Administered 2023-01-04: 16.14 via INTRAVENOUS

## 2023-01-04 NOTE — Progress Notes (Signed)
Tolerated scan well 

## 2023-03-08 ENCOUNTER — Encounter (HOSPITAL_COMMUNITY): Payer: Self-pay

## 2023-03-08 ENCOUNTER — Ambulatory Visit (HOSPITAL_COMMUNITY)
Admission: EM | Admit: 2023-03-08 | Discharge: 2023-03-08 | Disposition: A | Discharge status: Home / Self Care | Payer: Medicaid Other | Attending: Emergency Medicine | Admitting: Emergency Medicine

## 2023-03-08 DIAGNOSIS — G8929 Other chronic pain: Secondary | ICD-10-CM

## 2023-03-08 MED ORDER — KETOROLAC TROMETHAMINE 30 MG/ML IJ SOLN
30.0000 mg | Freq: Once | INTRAMUSCULAR | Status: AC
  Administered 2023-03-08: 30 mg via INTRAMUSCULAR

## 2023-03-08 MED ORDER — KETOROLAC TROMETHAMINE 30 MG/ML IJ SOLN
INTRAMUSCULAR | Status: AC
  Filled 2023-03-08: qty 1

## 2023-03-08 NOTE — ED Triage Notes (Signed)
Patient states she has had right hip pain x 1 month and right lower back pain for years Patient states she has been going to Ortho and receiving injections. Patient denies any recent injuries.  Patient states she has been taking muscle relaxants and states she quit taking them because it caused pain I her legs. Patiaent states she takes Tylenol PM for sleep.

## 2023-03-08 NOTE — ED Provider Notes (Signed)
[facility]    CSN: [id number] Arrival date & time: 03/08/23  1013      History   Chief Complaint Chief Complaint  Patient presents with   Hip Pain   Back Pain    HPI [name] Rowe is a 62 y.o. female. chronic back pain. R anterior hip pain for almost a year - providers all know about these pains. Unhappy with pain management - injections don't work. PT not helping, feels worse. Is in pain all the time. Doesn't like narcotics as they don't help with pain and make her feel weird. Is here because was here once several years ago for pain and was given some kind of IM injection that helped her pain temporarily. Would like that injection.    Hip Pain  Back Pain   Past Medical History:  Diagnosis Date   Adenomyosis    Anginal pain (HCC)    Anxiety 2001   Chest pain    a. No angiographic CAD by cath 2009 in [place]. There was catheter-induced RCA vasospasm versus microvascular obstruction. bEugenie Birks myoview 04/2010 - EF 79%, normal perfusion.;  c. ETT-Myoview 7/13: no ischemia, EF 77%, submax exercise   Chronic back pain    had 3 epidural injections on 02-14-13   Chronic kidney disease    Complication of anesthesia    has difficulty voiding after surgery   Coronary vasospasm (HCC)    CVA (cerebral vascular accident) (HCC) 2006   lost hearing on left   Depression 2001   Dysrhythmia    Fibromuscular dysplasia (HCC) 2009   a. Renal artery stenosis due to this. b. Carotid dopplers 07/2011 c/w FMD but no significant stenosis   Fibromyalgia 1999   H/O Graves' disease 2003   s/p radio-iodine treatment in 2003    Hearing loss of left ear    s/p CVA 2006   History of stent insertion of renal artery 2009   right renal stent   HLD (hyperlipidemia) 2000   HTN (hypertension) 1988   a. Likely related to RAS; urinary metanephrines and plasma renin/aldosterone ratio normal. b. H/o HTN urgency 06/2011 after being off BP meds for a number of days   Hx of echocardiogram     a. Echo (1/13): EF 55-60%, grade 1 diastolic dysfunction   Hypertensive crisis 06/29/2011   Hypothyroidism 2003   Graves disease s/p radio-iodine treatment in 2003    Irritable bowel syndrome    Leiomyoma    Migraine headache    Myocardial infarction [facility]) 2009   Personal history of colonic polyps-sessile serrated adenoma 02/23/2013   Renal artery stenosis (HCC)    a. Due to fibromuscular dysplasia. b. Renal stent 2009. c. Studies:  CTA abdomen (1/13) with evidence for fibromuscular dysplasia, right renal artery stent appears patent. Renal artery dopplers (2/13) with FMD but no evidence for signficant stenosis.    Tachycardia 2000   10/27/10-Holter -NSR with occ sinus tachy-rare PACs, no sig arrhythmia    Patient Active Problem List   Diagnosis Date Noted   CAD (coronary artery disease) 06/19/2018   At risk for sleep apnea 05/04/2017   Tachycardia 04/05/2017   Poor sleep pattern 10/20/2016   Fatigue 04/07/2016   Neck pain on left side 03/16/2016   Acute left-sided thoracic back pain 03/16/2016   Pain of left breast 10/30/2015   Lichen sclerosus 08/15/2014   Extremity edema 06/24/2014   Diplopia    History of coronary vasospasm 04/12/2014   Bursitis, trochanteric 11/22/2013   Essential hypertension  11/19/2013   Acute low back pain 08/10/2013   Personal history of colonic polyps-sessile serrated adenoma 02/23/2013   HNP (herniated nucleus pulposus), lumbar 12/01/2012   Lumbar radicular syndrome 12/01/2012   Seasonal allergies 11/30/2012   Intervertebral disc protrusion 11/06/2012   Situational anxiety 09/05/2012   Vitamin D insufficiency 06/30/2012   Unilateral hearing loss 06/30/2012   Vestibular dizziness 06/30/2012   Routine adult health maintenance 03/31/2012   Fibromuscular dysplasia (HCC) 08/13/2011   Dizziness and palpitations, episodic 07/06/2011   Poorly-controlled hypertension    History of CVA (cerebrovascular accident)    Hypothyroidism 04/10/2010    Dyslipidemia 04/10/2010   Anxiety 04/10/2010   Depression 04/10/2010   Migraine headache 04/10/2010   Renal artery stenosis (HCC) 04/10/2010   Fibromyalgia 04/10/2010   Status post lumbar surgery 04/10/2010   Backache 04/09/2010    Past Surgical History:  Procedure Laterality Date   BACK SURGERY  2010 and 2011   In CT.  11/19/2008 (possibly a laminectomy) and in May of 2011 she underwent a L2-L3 fusion.   cardiac cath     CARDIAC CATHETERIZATION  2009   states it was normal   CARDIAC CATHETERIZATION N/A 02/03/2015   Procedure: Left Heart Cath and Coronary Angiography;  Surgeon: Laurey Morale, MD;  Location: [facility] INVASIVE CV LAB;  Service: Cardiovascular;  Laterality: N/A;   COLONOSCOPY  last 02/16/2013   COLONOSCOPY     HIP SURGERY Bilateral 1988 and 1989   "scraped head of femur"   KNEE ARTHROSCOPY Left    LUMBAR LAMINECTOMY/DECOMPRESSION MICRODISCECTOMY Left 05/25/2013   Procedure: LUMBAR LAMINECTOMY/DECOMPRESSION MICRODISCECTOMY 1 LEVEL;  Surgeon: Carmela Hurt, MD;  Location: [facility];  Service: Neurosurgery;  Laterality: Left;  LEFT L5S1 microdiskectomy   POLYPECTOMY     RENAL ARTERY STENT  2009   ROBOTIC ASSISTED LAP VAGINAL HYSTERECTOMY  05/13/2008   [facility] Vinci hysterectomy  for myomatous uterus    SPINE SURGERY     VAGINAL HYSTERECTOMY      OB History   No obstetric history on file.      Home Medications    Prior to Admission medications   Medication Sig Start Date End Date Taking? Authorizing Provider  amLODipine (NORVASC) 2.5 MG tablet Take 1 tablet (2.5 mg total) by mouth daily. 06/19/18   Abelino Derrick, PA-C  ezetimibe (ZETIA) 10 MG tablet Take 10 mg by mouth daily. 07/29/22 07/29/23  [provider]  gabapentin (NEURONTIN) 100 MG capsule Take 100 mg by mouth 4 (four) times daily.    [provider]  hydrOXYzine (ATARAX/VISTARIL) 25 MG tablet Take 25 mg by mouth every 4 (four) hours as needed for anxiety.     [provider]   levothyroxine (SYNTHROID, LEVOTHROID) 150 MCG tablet TAKE 1 TABLET BY MOUTH ONCE DAILY 11/08/17   Almon Hercules, MD  LINZESS 290 MCG CAPS capsule TAKE 1 CAPSULE BY MOUTH DAILY 02/02/19   Lennox Solders, MD  metoprolol succinate (TOPROL-XL) 100 MG 24 hr tablet TAKE 1 TABLET BY MOUTH AT BEDTIME (DO  NOT  CRUSH)(BETA  BLOCKER)  NEEDS  APPOINTMENT 02/02/19   Abelino Derrick, PA-C  nitroGLYCERIN (NITROSTAT) 0.4 MG SL tablet Place 1 tablet (0.4 mg total) under the tongue every 5 (five) minutes as needed. x3 doses as needed for chest pain 06/19/18   Corine Shelter K, PA-C  rosuvastatin (CRESTOR) 40 MG tablet Take 1 tablet (40 mg total) by mouth daily. 07/06/18   Chilton Si, MD  sertraline (ZOLOFT)  100 MG tablet Take 1 tablet (100 mg total) by mouth daily. 04/06/17   Almon Hercules, MD  topiramate (TOPAMAX) 100 MG tablet Take 100 mg by mouth daily. 03/03/22   [provider]  traZODone (DESYREL) 150 MG tablet Take 150 mg by mouth at bedtime.     [provider]  Vitamin D, Ergocalciferol, (DRISDOL) 1.25 MG (50000 UNIT) CAPS capsule Take by mouth every 7 (seven) days. 04/02/22   [provider]    Family History Family History  Problem Relation Age of Onset   Heart disease Mother    Hypertension Mother    Asthma Mother    Hyperlipidemia Mother    Stroke Mother    Heart disease Father    Hypertension Father    Prostate cancer Father    Heart attack Father    Hyperlipidemia Father    Stroke Father    Metabolic syndrome Sister    Depression Sister    Hypertension Sister    Hypertension Brother    Heart attack Brother 27   Early death Brother 69       massive stroke    Hyperlipidemia Brother    Diabetes Maternal Aunt    Stomach cancer Maternal Aunt    Stomach cancer Son    Heart attack Other    Stroke Other    Colon cancer Neg Hx    Colon polyps Neg Hx    Esophageal cancer Neg Hx    Rectal cancer Neg Hx     Social History Social History   Tobacco Use    Smoking status: Never   Smokeless tobacco: Never  Vaping Use   Vaping status: Never Used  Substance Use Topics   Alcohol use: Not Currently    Comment: rarely   Drug use: No     Allergies   Hydromorphone hcl, Prochlorperazine edisylate, Sumatriptan, Atorvastatin, and Tramadol   Review of Systems Review of Systems  Musculoskeletal:  Positive for back pain.     Physical Exam Triage Vital Signs ED Triage Vitals  Encounter Vitals Group     BP 03/08/23 1105 130/79     Systolic BP Percentile --      Diastolic BP Percentile --      Pulse Rate 03/08/23 1105 66     Resp 03/08/23 1105 16     Temp 03/08/23 1105 98 F (36.7 C)     Temp Source 03/08/23 1105 Oral     SpO2 03/08/23 1105 98 %     Weight --      Height --      Head Circumference --      Peak Flow --      Pain Score 03/08/23 1104 8     Pain Loc --      Pain Education --      Exclude from Growth Chart --    No data found.  Updated Vital Signs BP 130/79 (BP Location: Right Arm)   Pulse 66   Temp 98 F (36.7 C) (Oral)   Resp 16   SpO2 98%   Visual Acuity Right Eye Distance:   Left Eye Distance:   Bilateral Distance:    Right Eye Near:   Left Eye Near:    Bilateral Near:     Physical Exam   UC Treatments / Results  Labs (all labs ordered are listed, but only abnormal results are displayed) Labs Reviewed - No data to display  EKG   Radiology No results found.  Procedures Procedures (including critical care time)  Medications Ordered in UC Medications  ketorolac (TORADOL) 30 MG/ML injection 30 mg (30 mg Intramuscular Given 03/08/23 1152)    Initial Impression / Assessment and Plan / UC Course  I have reviewed the triage vital signs and the nursing notes.  Pertinent labs & imaging results that were available during my care of the patient were reviewed by me and considered in my medical decision making (see chart for details).     *** Final Clinical Impressions(s) / UC Diagnoses    Final diagnoses:  Other chronic pain     Discharge Instructions      Please talk with your regular health care provider to get a referral to another pain management provider to get a second opinion about how to manage your pain   ED Prescriptions   None    PDMP not reviewed this encounter.

## 2023-03-08 NOTE — Discharge Instructions (Signed)
Please talk with your regular health care provider to get a referral to another pain management provider to get a second opinion about how to manage your pain

## 2023-03-21 ENCOUNTER — Emergency Department (HOSPITAL_COMMUNITY)
Admission: EM | Admit: 2023-03-21 | Discharge: 2023-03-22 | Disposition: A | Discharge status: Home / Self Care | Payer: Medicaid Other | Attending: Emergency Medicine | Admitting: Emergency Medicine

## 2023-03-21 ENCOUNTER — Encounter (HOSPITAL_COMMUNITY): Payer: Self-pay | Admitting: Emergency Medicine

## 2023-03-21 ENCOUNTER — Emergency Department (HOSPITAL_COMMUNITY): Payer: Medicaid Other

## 2023-03-21 ENCOUNTER — Other Ambulatory Visit: Payer: Self-pay

## 2023-03-21 DIAGNOSIS — I2510 Atherosclerotic heart disease of native coronary artery without angina pectoris: Secondary | ICD-10-CM | POA: Diagnosis not present

## 2023-03-21 DIAGNOSIS — R079 Chest pain, unspecified: Secondary | ICD-10-CM

## 2023-03-21 DIAGNOSIS — Z79899 Other long term (current) drug therapy: Secondary | ICD-10-CM | POA: Diagnosis not present

## 2023-03-21 DIAGNOSIS — R0789 Other chest pain: Secondary | ICD-10-CM | POA: Diagnosis present

## 2023-03-21 DIAGNOSIS — I10 Essential (primary) hypertension: Secondary | ICD-10-CM | POA: Insufficient documentation

## 2023-03-21 LAB — COMPREHENSIVE METABOLIC PANEL
ALT: 56 U/L — ABNORMAL HIGH (ref 0–44)
AST: 38 U/L (ref 15–41)
Albumin: 3.7 g/dL (ref 3.5–5.0)
Alkaline Phosphatase: 86 U/L (ref 38–126)
Anion gap: 9 (ref 5–15)
BUN: 15 mg/dL (ref 8–23)
CO2: 20 mmol/L — ABNORMAL LOW (ref 22–32)
Calcium: 8.9 mg/dL (ref 8.9–10.3)
Chloride: 110 mmol/L (ref 98–111)
Creatinine, Ser: 1.28 mg/dL — ABNORMAL HIGH (ref 0.44–1.00)
GFR, Estimated: 48 mL/min — ABNORMAL LOW (ref >60)
Glucose, Bld: 112 mg/dL — ABNORMAL HIGH (ref 70–99)
Potassium: 4.3 mmol/L (ref 3.5–5.1)
Sodium: 139 mmol/L (ref 135–145)
Total Bilirubin: 0.6 mg/dL (ref 0.3–1.2)
Total Protein: 6.4 g/dL — ABNORMAL LOW (ref 6.5–8.1)

## 2023-03-21 LAB — CBC WITH DIFFERENTIAL/PLATELET
Abs Immature Granulocytes: 0.02 10*3/uL (ref 0.00–0.07)
Basophils Absolute: 0 10*3/uL (ref 0.0–0.1)
Basophils Relative: 1 %
Eosinophils Absolute: 0.1 10*3/uL (ref 0.0–0.5)
Eosinophils Relative: 1 %
HCT: 37.4 % (ref 36.0–46.0)
Hemoglobin: 13 g/dL (ref 12.0–15.0)
Immature Granulocytes: 0 %
Lymphocytes Relative: 17 %
Lymphs Abs: 1.3 10*3/uL (ref 0.7–4.0)
MCH: 30.5 pg (ref 26.0–34.0)
MCHC: 34.8 g/dL (ref 30.0–36.0)
MCV: 87.8 fL (ref 80.0–100.0)
Monocytes Absolute: 0.5 10*3/uL (ref 0.1–1.0)
Monocytes Relative: 7 %
Neutro Abs: 5.8 10*3/uL (ref 1.7–7.7)
Neutrophils Relative %: 74 %
Platelets: 257 10*3/uL (ref 150–400)
RBC: 4.26 MIL/uL (ref 3.87–5.11)
RDW: 12.1 % (ref 11.5–15.5)
WBC: 7.8 10*3/uL (ref 4.0–10.5)
nRBC: 0 % (ref 0.0–0.2)

## 2023-03-21 LAB — BRAIN NATRIURETIC PEPTIDE: B Natriuretic Peptide: 83.1 pg/mL (ref 0.0–100.0)

## 2023-03-21 LAB — TROPONIN I (HIGH SENSITIVITY): Troponin I (High Sensitivity): 3 ng/L (ref <18)

## 2023-03-21 MED ORDER — ALUM & MAG HYDROXIDE-SIMETH 200-200-20 MG/5ML PO SUSP
30.0000 mL | Freq: Once | ORAL | Status: AC
  Administered 2023-03-21: 30 mL via ORAL
  Filled 2023-03-21: qty 30

## 2023-03-21 MED ORDER — ACETAMINOPHEN 500 MG PO TABS
1000.0000 mg | ORAL_TABLET | Freq: Once | ORAL | Status: AC
  Administered 2023-03-21: 1000 mg via ORAL
  Filled 2023-03-21: qty 2

## 2023-03-21 MED ORDER — FAMOTIDINE 20 MG PO TABS
20.0000 mg | ORAL_TABLET | Freq: Once | ORAL | Status: AC
  Administered 2023-03-21: 20 mg via ORAL
  Filled 2023-03-21: qty 1

## 2023-03-21 NOTE — ED Provider Notes (Signed)
[facility] AT [facility] Provider Note   CSN: [id number] Arrival date & time: 03/21/23  2131     History  Chief Complaint  Patient presents with   Chest Pain    [name] Rowe is a 62 y.o. female.   Chest Pain 62 year old female with past medical history of hypertension, hyperlipidemia, CAD status post stenting, history of Graves' disease now on thyroid medication presenting for evaluation of chest pain.  Patient states that she had a tooth pulled under anesthesia earlier today.  She was at home lying down when she developed left-sided chest pain, which she describes as pressure and stabbing.  She says that this was very severe for so she took 381 mg aspirin.  When symptoms had not resolved she called EMS for transport to the hospital.  Patient currently reports that her pain comes and goes.  Denies any recent fevers, cough, congestion, shortness of breath, abdominal pain, pain with urination, urinary frequency.  Patient does not think that this feels similar to her prior heart attack, but she says that she does not remember much from that episode and she was in the ICU.     Home Medications Prior to Admission medications   Medication Sig Start Date End Date Taking? Authorizing Provider  acetaminophen-codeine (TYLENOL #3) 300-30 MG tablet Take 1 tablet by mouth every 4 (four) hours as needed for moderate pain (pain score 4-6). 03/18/23  Yes [provider]  amLODipine (NORVASC) 5 MG tablet Take 5 mg by mouth daily. 01/18/23  Yes [provider]  ezetimibe (ZETIA) 10 MG tablet Take 10 mg by mouth daily. 07/29/22 07/29/23 Yes [provider]  gabapentin (NEURONTIN) 100 MG capsule Take 300 mg by mouth at bedtime.   Yes [provider]  hydrOXYzine (VISTARIL) 25 MG capsule Take 25 mg by mouth at bedtime.   Yes [provider]  levothyroxine (SYNTHROID) 125 MCG tablet Take 125 mcg by mouth every other day. 02/10/23   Yes [provider]  levothyroxine (SYNTHROID) 137 MCG tablet Take 137 mcg by mouth See admin instructions. Take 1 tablet by mouth on alternating days of the 02/10/23  Yes [provider]  LINZESS 290 MCG CAPS capsule TAKE 1 CAPSULE BY MOUTH DAILY 02/02/19  Yes Winfrey, Harlen Labs, MD  methocarbamol (ROBAXIN) 750 MG tablet Take 750 mg by mouth at bedtime.   Yes [provider]  metoprolol succinate (TOPROL-XL) 100 MG 24 hr tablet TAKE 1 TABLET BY MOUTH AT BEDTIME (DO  NOT  CRUSH)(BETA  BLOCKER)  NEEDS  APPOINTMENT 02/02/19  Yes Kilroy, Luke K, PA-C  nitroGLYCERIN (NITROSTAT) 0.4 MG SL tablet Place 1 tablet (0.4 mg total) under the tongue every 5 (five) minutes as needed. x3 doses as needed for chest pain 06/19/18  Yes Kilroy, Eda Paschal, PA-C  rosuvastatin (CRESTOR) 40 MG tablet Take 1 tablet (40 mg total) by mouth daily. 07/06/18  Yes Chilton Si, MD  topiramate (TOPAMAX) 100 MG tablet Take 100 mg by mouth daily. 03/03/22  Yes [provider]  venlafaxine XR (EFFEXOR-XR) 75 MG 24 hr capsule Take 75 mg by mouth daily. 12/30/22  Yes [provider]  Vitamin D, Ergocalciferol, (DRISDOL) 1.25 MG (50000 UNIT) CAPS capsule Take 50,000 Units by mouth every Sunday. 04/02/22  Yes [provider]      Allergies    Hydromorphone hcl, Prochlorperazine edisylate, Sumatriptan, Atorvastatin, Lisinopril, and Tramadol    Review of Systems   Review of Systems  Cardiovascular:  Positive for chest pain.    Physical Exam Updated Vital Signs BP 107/89   Pulse 65   Temp 98 F (36.7 C) (Oral)   Resp 18   Ht 5\' 4"  (1.626 m)   Wt 63 kg   SpO2 100%   BMI 23.86 kg/m  Physical Exam Constitutional:      General: She is not in acute distress.    Appearance: She is not ill-appearing.  HENT:     Head: Normocephalic and atraumatic.  Cardiovascular:     Rate and Rhythm: Normal rate.     Pulses:          Radial pulses are 2+ on the right side and 2+ on the  left side.       Dorsalis pedis pulses are 2+ on the right side and 2+ on the left side.     Heart sounds: Normal heart sounds.  Pulmonary:     Effort: Pulmonary effort is normal. No respiratory distress.     Breath sounds: No decreased breath sounds.  Chest:     Chest wall: No mass or tenderness.  Abdominal:     Palpations: Abdomen is soft.     Tenderness: There is no abdominal tenderness.  Musculoskeletal:     Cervical back: Normal range of motion.     Right lower leg: No edema.     Left lower leg: No edema.  Skin:    General: Skin is warm and dry.     Findings: No rash.  Neurological:     General: No focal deficit present.     Mental Status: She is alert.     Cranial Nerves: No cranial nerve deficit.     Motor: No weakness.     ED Results / Procedures / Treatments   Labs (all labs ordered are listed, but only abnormal results are displayed) Labs Reviewed  COMPREHENSIVE METABOLIC PANEL - Abnormal; Notable for the following components:      Result Value   CO2 20 (*)    Glucose, Bld 112 (*)    Creatinine, Ser 1.28 (*)    Total Protein 6.4 (*)    ALT 56 (*)    GFR, Estimated 48 (*)    All other components within normal limits  CBC WITH DIFFERENTIAL/PLATELET  BRAIN NATRIURETIC PEPTIDE  TROPONIN I (HIGH SENSITIVITY)  TROPONIN I (HIGH SENSITIVITY)    EKG EKG Interpretation Date/Time:  Monday March 21 2023 21:32:00 EDT Ventricular Rate:  68 PR Interval:  196 QRS Duration:  100 QT Interval:  421 QTC Calculation: 448 R Axis:   43  Text Interpretation: Sinus rhythm Nonspecific T wave abnormality Confirmed by Cathren Laine ([id number]) on 03/21/2023 10:15:10 PM  Radiology No results found.  Procedures Procedures    Medications Ordered in ED Medications  acetaminophen (TYLENOL) tablet 1,000 mg (1,000 mg Oral Given 03/21/23 2328)  famotidine (PEPCID) tablet 20 mg (20 mg Oral Given 03/21/23 2328)  alum & mag hydroxide-simeth (MAALOX/MYLANTA) 200-200-20 MG/5ML  suspension 30 mL (30 mLs Oral Given 03/21/23 2327)    ED Course/ Medical Decision Making/ A&P Clinical Course as of 03/21/23 2354  Mon Mar 21, 2023  2322 Delta trop, reassess. HEART score 4. Likely DC if delta negative  [AS]    Clinical Course User Index [AS] Jodi Mourning' Criteria for Pulmonary Embolism from [email or web address]  on 03/21/2023 ** All calculations should be rechecked by clinician prior to use **  RESULT SUMMARY: 0.0 points Low  risk group: 1.3% chance of PE in an ED population.   Another study assigned scores <= 4 as "PE Unlikely" and had a 3% incidence of PE.   INPUTS: Clinical signs and symptoms of DVT --> 0 = No PE is #1 diagnosis OR equally likely --> 0 = No Heart rate > 100 --> 0 = No Immobilization at least 3 days OR surgery in the previous 4 weeks --> 0 = No Previous, objectively diagnosed PE or DVT --> 0 = No Hemoptysis --> 0 = No Malignancy w/ treatment within 6 months or palliative --> 0 = No          HEART Score: 4                    Medical Decision Making Amount and/or Complexity of Data Reviewed Labs: ordered. Radiology: ordered.  Risk OTC drugs.   Pt is a 62 y.o. female with pertinent PMHX of hypertension, hyperlipidemia, CAD status post stenting, history of Graves' disease now on thyroid medication who presents w/ several hours of left-sided chest pain described as pressure and stabbing.  Because of the age and risk factors of the patient, ACS will be ruled out with troponin x 2.  Patient took three 81 mg aspirin prior to arrival, therefore would not repeat while in the emergency department.  Is largely asymptomatic on my evaluation, nitro was not given. Chest XR and EKG performed. EKG: Sinus rhythm without concerning ST segment elevation or depression.  No new T wave inversions.  Not a STEMI.  Low concern for acute ischemia.  Initial troponin 3. BNP normal at 83, not suggestive of CHF.   HEAR score calculation: 4    Unlikely PNA as CXR without focal consolidation, no leukocytosis, no cough, no fever. Unlikely PE as atypical presentation, low risk per Wells. Presentation not consistent with Aortic Dissection, Pancreatitis, Arrhythmia, Pneumothorax, Endo/Myo/Pericarditis, Shingles, Emergent complications of an Ulcer, Esophageal pathology, or other emergent pathology. Other differential considered includes: GERD, Gastritis, Trauma, Viral Infection, MSK Pain, Costochondritis, Chest wall pain, Myalgia, COPD, CHF, Biliary Disease  At time of signout, patient second troponin remains pending.  Do feel this is necessary given her history of CAD with stenting.  If unremarkable, anticipate the patient will be safe for discharge home with primary care and cardiology follow-up.  Oncoming ED team is aware of patient's ED course and plan to follow-up remaining troponin.    Labs and imaging reviewed by myself and considered in medical decision making if ordered.  Imaging interpreted by radiology.  The plan for this patient was discussed with Dr. Denton Lank, who voiced agreement and who oversaw evaluation and treatment of this patient.     Final Clinical Impression(s) / ED Diagnoses Final diagnoses:  Chest pain in adult    Rx / DC Orders ED Discharge Orders     None         Lyman Speller, MD 03/21/23 2354    Cathren Laine, MD 03/22/23 1257

## 2023-03-21 NOTE — ED Triage Notes (Signed)
Pt BIB EMS for chest pain that began approx 1 hour ago, pt has hx of MI in2009, HTN, CVA, pt reports that pain is center of chest, with SHOB at onset and sweating, pt reports that she had a tooth pulled today. EMS gave aspirin, nitroglycerin and Zofran.

## 2023-03-22 LAB — TROPONIN I (HIGH SENSITIVITY): Troponin I (High Sensitivity): 4 ng/L (ref <18)

## 2023-03-22 NOTE — Discharge Instructions (Signed)
You have been seen today for your complaint of chest pain. Your lab work was reassuring. Your imaging was reassuring. Follow up with: Your primary cardiologist.  Call to schedule a follow-up appointment within the next week Please seek immediate medical care if you develop any of the following symptoms: Your chest pain gets worse. You have a cough that gets worse, or you cough up blood. You have severe pain in your abdomen. You faint. You have sudden, unexplained chest discomfort. You have sudden, unexplained discomfort in your arms, back, neck, or jaw. You have shortness of breath at any time. You suddenly start to sweat, or your skin gets clammy. You feel nausea or you vomit. You suddenly feel lightheaded or dizzy. You have severe weakness, or unexplained weakness or fatigue. Your heart begins to beat quickly, or it feels like it is skipping beats. At this time there does not appear to be the presence of an emergent medical condition, however there is always the potential for conditions to change. Please read and follow the below instructions.  Do not take your medicine if  develop an itchy rash, swelling in your mouth or lips, or difficulty breathing; call 911 and seek immediate emergency medical attention if this occurs.  You may review your lab tests and imaging results in their entirety on your MyChart account.  Please discuss all results of fully with your primary care provider and other specialist at your follow-up visit.  Note: Portions of this text may have been transcribed using voice recognition software. Every effort was made to ensure accuracy; however, inadvertent computerized transcription errors may still be present.

## 2023-03-22 NOTE — ED Provider Notes (Signed)
  Physical Exam  BP 107/89   Pulse 65   Temp 98 F (36.7 C) (Oral)   Resp 18   Ht 5\' 4"  (1.626 m)   Wt 63 kg   SpO2 100%   BMI 23.86 kg/m   Physical Exam Vitals and nursing note reviewed.  Constitutional:      General: She is not in acute distress.    Appearance: Normal appearance. She is normal weight. She is not ill-appearing.  HENT:     Head: Normocephalic and atraumatic.  Pulmonary:     Effort: Pulmonary effort is normal. No respiratory distress.  Abdominal:     General: Abdomen is flat.  Musculoskeletal:        General: Normal range of motion.     Cervical back: Neck supple.  Skin:    General: Skin is warm and dry.  Neurological:     Mental Status: She is alert and oriented to person, place, and time.  Psychiatric:        Mood and Affect: Mood normal.        Behavior: Behavior normal.     Procedures  Procedures  ED Course / MDM   Clinical Course as of 03/22/23 0051  Mon Mar 21, 2023  2322 Delta trop, reassess. HEART score 4. Likely DC if delta negative  [AS]    Clinical Course User Index [AS] Ester Mabe, Edsel Petrin, PA-C   Medical Decision Making Amount and/or Complexity of Data Reviewed Labs: ordered. Radiology: ordered.  Risk OTC drugs.  Assumed care at shift change from previous provider.  Please see previous note for full HPI.  In short, 62 year old female with a history of CAD presenting for evaluation of chest pain.  Pain comes and goes.  Localized to the left side of the chest.  No shortness of breath.  Plan at the time shift change is pending delta troponin given patient's high risk status.  Has a heart score of 4.  Previous provider has very low concern for ACS if delta troponin is negative.  0049-delta troponin negative.  Reassessed patient.  She states she no longer has any pain.  Discussed the findings with the patient.  She is agreeable to discharge home.  She states she will be able to follow-up with her primary cardiologist within the next  week.  She was given strict return precautions.  Stable at discharge.  At this time there does not appear to be any evidence of an acute emergency medical condition and the patient appears stable for discharge with appropriate outpatient follow up. Diagnosis was discussed with patient who verbalizes understanding of care plan and is agreeable to discharge. I have discussed return precautions with patient who verbalizes understanding. Patient encouraged to follow-up with their PCP within 1 week. All questions answered.  Note: Portions of this report may have been transcribed using voice recognition software. Every effort was made to ensure accuracy; however, inadvertent computerized transcription errors may still be present.    Michelle Piper, PA-C 03/22/23 0051    Dione Booze, MD 03/22/23 [phone]

## 2023-06-10 ENCOUNTER — Telehealth: Payer: Self-pay | Admitting: Cardiology

## 2023-06-10 NOTE — Telephone Encounter (Signed)
   Pt c/o of Chest Pain: STAT if active CP, including tightness, pressure, jaw pain, radiating pain to shoulder/upper arm/back, CP unrelieved by Nitro. Symptoms reported of SOB, nausea, vomiting, sweating.  1. Are you having CP right now? Wednesday    2. Are you experiencing any other symptoms (ex. SOB, nausea, vomiting, sweating)? SOB; radiating pain to back; lightheadedness; pre-syncope   3. Is your CP continuous or coming and going? Coming and going   4. Have you taken Nitroglycerin ? Yes    5. How long have you been experiencing CP? Wednesday    6. If NO CP at time of call then end call with telling Pt to call back or call 911 if Chest pain returns prior to return call from triage team.

## 2023-06-10 NOTE — Telephone Encounter (Signed)
 Patient identification verified by 2 forms. Bertina Cooks, RN    Called and spoke to patient  Patient states:   -chest pain on and off for a few weeks   -not currently having chest pain   -last episode of chest pain was Wednesday, resolved after taking NTG   -during Wednesday episode of chest pain felt like she was going to pass out  Patient denies at time of call:   -chest pain   -SOB/difficulty breathing  Patient scheduled for OV 06/13/23 at 3:20pm with Dr. Pietro  Reviewed ED warning signs/precautions  Patient verbalized understanding, no questions at this time

## 2023-06-13 ENCOUNTER — Ambulatory Visit: Payer: Medicaid Other | Attending: Cardiology | Admitting: Cardiology

## 2023-06-13 ENCOUNTER — Encounter: Payer: Self-pay | Admitting: Cardiology

## 2023-06-13 VITALS — BP 124/82 | HR 63 | Ht 64.0 in | Wt 140.0 lb

## 2023-06-13 DIAGNOSIS — I10 Essential (primary) hypertension: Secondary | ICD-10-CM

## 2023-06-13 DIAGNOSIS — R072 Precordial pain: Secondary | ICD-10-CM | POA: Diagnosis not present

## 2023-06-13 DIAGNOSIS — I25118 Atherosclerotic heart disease of native coronary artery with other forms of angina pectoris: Secondary | ICD-10-CM | POA: Diagnosis not present

## 2023-06-13 DIAGNOSIS — R002 Palpitations: Secondary | ICD-10-CM

## 2023-06-13 NOTE — Patient Instructions (Signed)
    Follow-Up: At [facility], you and your health needs are our priority.  As part of our continuing mission to provide you with exceptional heart care, we have created designated Provider Care Teams.  These Care Teams include your primary Cardiologist (physician) and Advanced Practice Providers (APPs -  Physician Assistants and Nurse Practitioners) who all work together to provide you with the care you need, when you need it.  We recommend signing up for the patient portal called MyChart.  Sign up information is provided on this After Visit Summary.  MyChart is used to connect with patients for Virtual Visits (Telemedicine).  Patients are able to view lab/test results, encounter notes, upcoming appointments, etc.  Non-urgent messages can be sent to your provider as well.   To learn more about what you can do with MyChart, go to [email or web address].    Your next appointment:   3 month(s)  Provider:   Peter Jordan, MD

## 2023-06-13 NOTE — Progress Notes (Signed)
 HPI: Follow-up prior CVA, hypertension, hyperlipidemia, fibromuscular dysplasia, coronary vasospasm, inappropriate sinus tachycardia.  Patient is followed by Dr. Maryanna Jordan.  Patient apparently had a left heart catheterization in 2009 in [place]  that showed catheter induced right coronary spasm.  She has had previous stent in 2009 for right renal artery stenosis/fibromuscular dysplasia.  Cardiac catheterization August 2016 showed 40 to 50% proximal LAD.  CTA March 2018 showed patent stent in the right renal artery and patent left renal artery.  Echocardiogram March 2018 showed normal LV function, grade 1 diastolic dysfunction.  Patient apparently had another cardiac catheterization February 2022 in [place] .  She was found to have a 90% mid LAD and had PCI.  There was note of significant catheter induced vasospasm in the right coronary artery.  Holter monitor July 2018 showed sinus rhythm to sinus tachycardia.  CTA of the head and neck April 2024 showed patent carotid arteries.  ABIs May 2024 showed abnormal toe-brachial index bilaterally.  Stress PET July 2024 showed ejection fraction 68% and normal perfusion.  She has had difficulties with palpitations and labile hypertension. Called the office with chest pain and added to my schedule today.  Patient states that last week (5 days ago) she had an episode where she felt her heart racing for 1-1/2 minutes associated with chest pain.  There was dizziness but no syncope.  She occasionally feels her heart squeeze for 1 to 2 seconds while sitting.  She has some dyspnea on exertion.  She complains of fatigue in her legs.  Current Outpatient Medications  Medication Sig Dispense Refill   acetaminophen -codeine  (TYLENOL  #3) 300-30 MG tablet Take 1 tablet by mouth every 4 (four) hours as needed for moderate pain (pain score 4-6).     amLODipine  (NORVASC ) 5 MG tablet Take 5 mg by mouth daily.     ezetimibe  (ZETIA ) 10 MG tablet Take 10 mg by mouth  daily.     gabapentin  (NEURONTIN ) 100 MG capsule Take 300 mg by mouth at bedtime.     hydrOXYzine  (VISTARIL ) 25 MG capsule Take 25 mg by mouth at bedtime.     levothyroxine  (SYNTHROID ) 125 MCG tablet Take 125 mcg by mouth every other day.     levothyroxine  (SYNTHROID ) 137 MCG tablet Take 137 mcg by mouth See admin instructions. Take 1 tablet by mouth on alternating days of the 125mcg     LINZESS  290 MCG CAPS capsule TAKE 1 CAPSULE BY MOUTH DAILY 30 capsule 2   metoprolol  succinate (TOPROL -XL) 100 MG 24 hr tablet TAKE 1 TABLET BY MOUTH AT BEDTIME (DO  NOT  CRUSH)(BETA  BLOCKER)  NEEDS  APPOINTMENT 30 tablet 0   nitroGLYCERIN  (NITROSTAT ) 0.4 MG SL tablet Place 1 tablet (0.4 mg total) under the tongue every 5 (five) minutes as needed. x3 doses as needed for chest pain 25 tablet 3   rosuvastatin  (CRESTOR ) 40 MG tablet Take 1 tablet (40 mg total) by mouth daily. 30 tablet 11   topiramate (TOPAMAX) 100 MG tablet Take 100 mg by mouth daily.     venlafaxine XR (EFFEXOR-XR) 75 MG 24 hr capsule Take 75 mg by mouth daily.     Vitamin D , Ergocalciferol , (DRISDOL ) 1.25 MG (50000 UNIT) CAPS capsule Take 50,000 Units by mouth every Sunday.     methocarbamol  (ROBAXIN ) 750 MG tablet Take 750 mg by mouth at bedtime. (Patient not taking: Reported on 06/13/2023)     No current facility-administered medications for this visit.     Past Medical History:  Diagnosis Date   Adenomyosis    Anginal pain (HCC)    Anxiety 2001   Chest pain    a. No angiographic CAD by cath 2009 in [place]. There was catheter-induced RCA vasospasm versus microvascular obstruction. b. Lexiscan  myoview  04/2010 - EF 79%, normal perfusion.;  c. ETT-Myoview  7/13: no ischemia, EF 77%, submax exercise   Chronic back pain    had 3 epidural injections on 02-14-13   Chronic kidney disease    Complication of anesthesia    has difficulty voiding after surgery   Coronary vasospasm (HCC)    CVA (cerebral vascular accident) (HCC) 2006   lost  hearing on left   Depression 2001   Dysrhythmia    Fibromuscular dysplasia (HCC) 2009   a. Renal artery stenosis due to this. b. Carotid dopplers 07/2011 c/w FMD but no significant stenosis   Fibromyalgia 1999   H/O Graves' disease 2003   s/p radio-iodine treatment in 2003    Hearing loss of left ear    s/p CVA 2006   History of stent insertion of renal artery 2009   right renal stent   HLD (hyperlipidemia) 2000   HTN (hypertension) 1988   a. Likely related to RAS; urinary metanephrines and plasma renin/aldosterone ratio normal. b. H/o HTN urgency 06/2011 after being off BP meds for a number of days   Hx of echocardiogram    a. Echo (1/13): EF 55-60%, grade 1 diastolic dysfunction   Hypertensive crisis 06/29/2011   Hypothyroidism 2003   Graves disease s/p radio-iodine treatment in 2003    Irritable bowel syndrome    Leiomyoma    Migraine headache    Myocardial infarction [facility]) 2009   Personal history of colonic polyps-sessile serrated adenoma 02/23/2013   Renal artery stenosis (HCC)    a. Due to fibromuscular dysplasia. b. Renal stent 2009. c. Studies:  CTA abdomen (1/13) with evidence for fibromuscular dysplasia, right renal artery stent appears patent. Renal artery dopplers (2/13) with FMD but no evidence for signficant stenosis.    Tachycardia 2000   10/27/10-Holter -NSR with occ sinus tachy-rare PACs, no sig arrhythmia    Past Surgical History:  Procedure Laterality Date   BACK SURGERY  2010 and 2011   In [place].  11/19/2008 (possibly a laminectomy) and in May of 2011 she underwent a L2-L3 fusion.   cardiac cath     CARDIAC CATHETERIZATION  2009   states it was normal   CARDIAC CATHETERIZATION N/A 02/03/2015   Procedure: Left Heart Cath and Coronary Angiography;  Surgeon: Ezra GORMAN Shuck, MD;  Location: [facility] INVASIVE CV LAB;  Service: Cardiovascular;  Laterality: N/A;   COLONOSCOPY  last 02/16/2013   COLONOSCOPY     HIP SURGERY Bilateral 1988 and 1989   scraped head of femur    KNEE ARTHROSCOPY Left    LUMBAR LAMINECTOMY/DECOMPRESSION MICRODISCECTOMY Left 05/25/2013   Procedure: LUMBAR LAMINECTOMY/DECOMPRESSION MICRODISCECTOMY 1 LEVEL;  Surgeon: Rockey LITTIE Peru, MD;  Location: [facility] NEURO ORS;  Service: Neurosurgery;  Laterality: Left;  LEFT L5S1 microdiskectomy   POLYPECTOMY     RENAL ARTERY STENT  2009   ROBOTIC ASSISTED LAP VAGINAL HYSTERECTOMY  05/13/2008   [facility] Vinc[facility] hysterectomy  for myomatous uterus    SPINE SURGERY     VAGINAL HYSTERECTOMY      Social History   Socioeconomic History   Marital status: Divorced    Spouse name: Not on file   Number of children: 2   Years of education: Not on file  Highest education level: Not on file  Occupational History   Occupation: worked in a museum/gallery conservator.    Employer: UNEMPLOYED    Comment: Quit about a yr ago because fo work related injury to left knee  Tobacco Use   Smoking status: Never   Smokeless tobacco: Never  Vaping Use   Vaping status: Never Used  Substance and Sexual Activity   Alcohol use: Not Currently    Comment: rarely   Drug use: No   Sexual activity: Not on file  Other Topics Concern   Not on file  Social History Narrative   Second Husband is currently in jail for an offense committed in [place] and he is awaiting extradition.   Lives alone.    Jehovah's witness.    Walks 20-30 minutes.                Social Drivers of Health   Financial Resource Strain: Low Risk  (12/07/2021)   [facility], [facility] visits prior to 08/07/2022., [facility] visits prior to 08/07/2022., [facility]   Overall Financial Resource Strain (CARDIA)    Difficulty of Paying Living Expenses: Not very hard  Food Insecurity: Medium Risk (08/03/2022)   [facility]   Hunger Vital Sign    Worried About Running Out of Food in the Last Year: Sometimes true    Within the past 12 months, the food you bought just  didn't last and you didn't have money to get more: Not on file  Transportation Needs: No Transportation Needs (08/03/2022)   Received from [facility] visits prior to 08/07/2022., [facility] visits prior to 08/07/2022.   Transportation    In the past 12 months, has lack of reliable transportation kept you from medical appointments, meetings, work or from getting things needed for daily living?: No  Physical Activity: Inactive (12/07/2021)   [facility], [facility] prior to 08/07/2022., [facility] visits prior to 08/07/2022., [facility]   Exercise Vital Sign    Days of Exercise per Week: 0 days    Minutes of Exercise per Session: 10 min  Stress: Stress Concern Present (12/07/2021)   Received from [facility], [facility] visits prior to 08/07/2022., [facility] visits prior to 08/07/2022., [facility]   [employer] of Occupational Health - Occupational Stress Questionnaire    Feeling of Stress : Very much  Social Connections: Moderately Integrated (12/07/2021)   [facility], [facility] prior to 08/07/2022., [facility] visits prior to 08/07/2022.   Social Connection and Isolation Panel [NHANES]    Frequency of Communication with Friends and Family: More than three times a week    Frequency of Social Gatherings with Friends and Family: Three times a week    Attends Religious Services: More than 4 times per year    Active Member of Clubs or Organizations: Yes    Attends Banker Meetings: More than 4 times per year    Marital Status: Divorced  Intimate Partner Violence: Not At Risk (12/07/2021)   [facility] prior to 08/07/2022., [facility] visits prior to 08/07/2022.   Humiliation, Afraid, Rape,  and Kick questionnaire    Fear of Current or Ex-Partner: No  Emotionally Abused: No    Physically Abused: No    Sexually Abused: No    Family History  Problem Relation Age of Onset   Heart disease Mother    Hypertension Mother    Asthma Mother    Hyperlipidemia Mother    Stroke Mother    Heart disease Father    Hypertension Father    Prostate cancer Father    Heart attack Father    Hyperlipidemia Father    Stroke Father    Metabolic syndrome Sister    Depression Sister    Hypertension Sister    Hypertension Brother    Heart attack Brother 78   Early death Brother 37       massive stroke    Hyperlipidemia Brother    Diabetes Maternal Aunt    Stomach cancer Maternal Aunt    Stomach cancer Son    Heart attack Other    Stroke Other    Colon cancer Neg Hx    Colon polyps Neg Hx    Esophageal cancer Neg Hx    Rectal cancer Neg Hx     ROS: no fevers or chills, productive cough, hemoptysis, dysphasia, odynophagia, melena, hematochezia, dysuria, hematuria, rash, seizure activity, orthopnea, PND, pedal edema, claudication. Remaining systems are negative.  Physical Exam: Well-developed well-nourished in no acute distress.  Skin is warm and dry.  HEENT is normal.  Neck is supple.  Chest is clear to auscultation with normal expansion.  Cardiovascular exam is regular rate and rhythm.  Abdominal exam nontender or distended. No masses palpated. Extremities show no edema. neuro grossly intact  EKG Interpretation Date/Time:  Monday June 13 2023 15:14:46 EST Ventricular Rate:  63 PR Interval:  180 QRS Duration:  68 QT Interval:  398 QTC Calculation: 407 R Axis:   18  Text Interpretation: Normal sinus rhythm Normal ECG When compared with ECG of 21-Mar-2023 21:32, PREVIOUS ECG IS PRESENT Confirmed by Pietro Rogue ([id number]) on 06/13/2023 3:20:25 PM    A/P  1 chest pain-symptoms are atypical and occurred only with palpitations.  Recent stress PET normal.  ECG shows no  acute ST changes.  Will not pursue further ischemia evaluation.  2 history of palpitations-continue Toprol .  She did have a symptomatic episode of palpitations 5 days ago.  We discussed a smart watch with the ability record strips associated with any symptoms and forward for our review.  3 hypertension-blood pressure controlled.  Continue present medical regimen.  4 history of fibromuscular dysplasia/renal artery stenosis-previous right renal stent.  5 coronary artery disease-continue aspirin  and statin.   6 hyperlipidemia-continue Zetia  and statin.  7 Jehovah's Witness  Rogue Pietro, MD

## 2023-07-22 ENCOUNTER — Telehealth: Payer: Self-pay | Admitting: Cardiology

## 2023-07-22 NOTE — Telephone Encounter (Signed)
Pt had labs done at PCP and he told her that her Potassium was too high. She wants Dr [place] to look at them. I told her we do not have the labs. She is going to call Elyn Aquas, PA office to have them faxed him.

## 2023-07-22 NOTE — Telephone Encounter (Signed)
Spoke to patient Dr.Jordan's advice given.

## 2023-07-22 NOTE — Telephone Encounter (Signed)
Received a call from patient she stated she had potassium checked with PCP yesterday.Stated potassium was elevated.She was told to drink more water.She wanted Dr.Jordan's advice.Advised Dr.Jordan is out of office today.Advised she should avoid potassium enriched foods.She should call PCP to have repeat potassium.I will make Dr.Jordan aware.

## 2023-08-27 ENCOUNTER — Emergency Department (HOSPITAL_COMMUNITY)

## 2023-08-27 ENCOUNTER — Emergency Department (HOSPITAL_COMMUNITY)
Admission: EM | Admit: 2023-08-27 | Discharge: 2023-08-27 | Disposition: A | Discharge status: Home / Self Care | Attending: Emergency Medicine | Admitting: Emergency Medicine

## 2023-08-27 ENCOUNTER — Encounter (HOSPITAL_COMMUNITY): Payer: Self-pay

## 2023-08-27 DIAGNOSIS — R109 Unspecified abdominal pain: Secondary | ICD-10-CM | POA: Diagnosis present

## 2023-08-27 DIAGNOSIS — Z79899 Other long term (current) drug therapy: Secondary | ICD-10-CM | POA: Insufficient documentation

## 2023-08-27 DIAGNOSIS — N390 Urinary tract infection, site not specified: Secondary | ICD-10-CM

## 2023-08-27 DIAGNOSIS — R10A0 Flank pain, unspecified side: Secondary | ICD-10-CM

## 2023-08-27 DIAGNOSIS — I129 Hypertensive chronic kidney disease with stage 1 through stage 4 chronic kidney disease, or unspecified chronic kidney disease: Secondary | ICD-10-CM | POA: Insufficient documentation

## 2023-08-27 DIAGNOSIS — N189 Chronic kidney disease, unspecified: Secondary | ICD-10-CM | POA: Diagnosis not present

## 2023-08-27 LAB — URINALYSIS, ROUTINE W REFLEX MICROSCOPIC
Bilirubin Urine: NEGATIVE
Glucose, UA: NEGATIVE mg/dL
Ketones, ur: NEGATIVE mg/dL
Nitrite: NEGATIVE
Protein, ur: NEGATIVE mg/dL
Specific Gravity, Urine: 1.025 (ref 1.005–1.030)
pH: 6 (ref 5.0–8.0)

## 2023-08-27 LAB — BASIC METABOLIC PANEL
Anion gap: 9 (ref 5–15)
BUN: 15 mg/dL (ref 8–23)
CO2: 21 mmol/L — ABNORMAL LOW (ref 22–32)
Calcium: 9.8 mg/dL (ref 8.9–10.3)
Chloride: 111 mmol/L (ref 98–111)
Creatinine, Ser: 1.11 mg/dL — ABNORMAL HIGH (ref 0.44–1.00)
GFR, Estimated: 57 mL/min — ABNORMAL LOW (ref >60)
Glucose, Bld: 114 mg/dL — ABNORMAL HIGH (ref 70–99)
Potassium: 4.8 mmol/L (ref 3.5–5.1)
Sodium: 141 mmol/L (ref 135–145)

## 2023-08-27 LAB — URINALYSIS, MICROSCOPIC (REFLEX)

## 2023-08-27 LAB — CBC
HCT: 45.1 % (ref 36.0–46.0)
Hemoglobin: 15.2 g/dL — ABNORMAL HIGH (ref 12.0–15.0)
MCH: 30.9 pg (ref 26.0–34.0)
MCHC: 33.7 g/dL (ref 30.0–36.0)
MCV: 91.7 fL (ref 80.0–100.0)
Platelets: 344 10*3/uL (ref 150–400)
RBC: 4.92 MIL/uL (ref 3.87–5.11)
RDW: 13.9 % (ref 11.5–15.5)
WBC: 8.4 10*3/uL (ref 4.0–10.5)
nRBC: 0 % (ref 0.0–0.2)

## 2023-08-27 LAB — HEPATIC FUNCTION PANEL
ALT: 120 U/L — ABNORMAL HIGH (ref 0–44)
AST: 57 U/L — ABNORMAL HIGH (ref 15–41)
Albumin: 4.1 g/dL (ref 3.5–5.0)
Alkaline Phosphatase: 99 U/L (ref 38–126)
Bilirubin, Direct: <0.1 mg/dL (ref 0.0–0.2)
Total Bilirubin: 0.6 mg/dL (ref 0.0–1.2)
Total Protein: 7.3 g/dL (ref 6.5–8.1)

## 2023-08-27 MED ORDER — MORPHINE SULFATE (PF) 4 MG/ML IV SOLN
4.0000 mg | Freq: Once | INTRAVENOUS | Status: AC
  Administered 2023-08-27: 4 mg via INTRAVENOUS
  Filled 2023-08-27: qty 1

## 2023-08-27 MED ORDER — LACTATED RINGERS IV BOLUS
1000.0000 mL | Freq: Once | INTRAVENOUS | Status: AC
  Administered 2023-08-27: 1000 mL via INTRAVENOUS

## 2023-08-27 MED ORDER — ONDANSETRON 4 MG PO TBDP
4.0000 mg | ORAL_TABLET | Freq: Once | ORAL | Status: AC
  Administered 2023-08-27: 4 mg via ORAL
  Filled 2023-08-27: qty 1

## 2023-08-27 MED ORDER — LIDOCAINE 5 % EX PTCH
1.0000 | MEDICATED_PATCH | CUTANEOUS | Status: DC
  Administered 2023-08-27: 1 via TRANSDERMAL
  Filled 2023-08-27: qty 1

## 2023-08-27 MED ORDER — SODIUM CHLORIDE 0.9 % IV SOLN
1.0000 g | Freq: Once | INTRAVENOUS | Status: AC
  Administered 2023-08-27: 1 g via INTRAVENOUS
  Filled 2023-08-27: qty 10

## 2023-08-27 MED ORDER — METHOCARBAMOL 500 MG PO TABS: 500.0000 mg | ORAL_TABLET | Freq: Two times a day (BID) | ORAL | 0 refills | Status: DC

## 2023-08-27 MED ORDER — ONDANSETRON HCL 4 MG/2ML IJ SOLN
4.0000 mg | Freq: Once | INTRAMUSCULAR | Status: AC
  Administered 2023-08-27: 4 mg via INTRAVENOUS
  Filled 2023-08-27: qty 2

## 2023-08-27 MED ORDER — OXYCODONE-ACETAMINOPHEN 5-325 MG PO TABS
1.0000 | ORAL_TABLET | Freq: Once | ORAL | Status: AC
  Administered 2023-08-27: 1 via ORAL
  Filled 2023-08-27: qty 1

## 2023-08-27 MED ORDER — DIPHENHYDRAMINE HCL 50 MG/ML IJ SOLN
25.0000 mg | Freq: Once | INTRAMUSCULAR | Status: AC
  Administered 2023-08-27: 25 mg via INTRAVENOUS
  Filled 2023-08-27: qty 1

## 2023-08-27 MED ORDER — CEPHALEXIN 500 MG PO CAPS
500.0000 mg | ORAL_CAPSULE | Freq: Three times a day (TID) | ORAL | 0 refills | Status: AC
Start: 2023-08-27 — End: 2023-09-03

## 2023-08-27 MED ORDER — LIDOCAINE 5 % EX PTCH
1.0000 | MEDICATED_PATCH | CUTANEOUS | 0 refills | Status: DC
Start: 2023-08-27 — End: 2024-04-27
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
## 2023-08-27 NOTE — Discharge Instructions (Addendum)
 You might have a UTI depending on your urine.  We will send a urine culture to confirm.  Otherwise have sent antibiotic, muscle relaxer and Lidoderm patch into the pharmacy.  Muscle relaxer can make you drowsy.  Do not drive or do anything else dangerous after taking this medication.  For any worsening symptoms return to the emergency room.
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
## 2023-08-28 LAB — URINE CULTURE: Culture: <10000 — AB

## 2023-09-06 ENCOUNTER — Other Ambulatory Visit (INDEPENDENT_AMBULATORY_CARE_PROVIDER_SITE_OTHER): Payer: Self-pay

## 2023-09-06 ENCOUNTER — Encounter: Payer: Self-pay | Admitting: Orthopaedic Surgery

## 2023-09-06 ENCOUNTER — Ambulatory Visit: Admitting: Orthopaedic Surgery

## 2023-09-06 DIAGNOSIS — M25551 Pain in right hip: Secondary | ICD-10-CM

## 2023-09-06 NOTE — Progress Notes (Signed)
 Office Visit Note   Patient: [name] Rowe           Date of Birth: Oct 06, 1960           MRN: [id number] Visit Date: 09/06/2023              Requested by: Coletta Memos, MD [address] [place],  [place] [address] PCP: Bailey Mech, PA-C   Assessment & Plan: Visit Diagnoses:  1. Pain in right hip     Plan: Patient is a 63 year old female with right hip pain.  Recent MRI shows a torn anterior superior labrum.  She has had an intra-articular hip injection which did not provide much relief.  Based on her age hip arthroscopy is not recommended.  I feel that a total hip arthroplasty is too aggressive for her at this time.  I would recommend physical therapy for now.  I will make the referral.  Will see her back as needed.  Follow-Up Instructions: No follow-ups on file.   Orders:  Orders Placed This Encounter  Procedures   XR Lumbar Spine 2-3 Views   XR HIP UNILAT W OR W/O PELVIS 2-3 VIEWS RIGHT   Ambulatory referral to Physical Therapy   No orders of the defined types were placed in this encounter.     Procedures: No procedures performed   Clinical Data: No additional findings.   Subjective: Chief Complaint  Patient presents with   Right Hip - Pain    HPI Patient is a 63 year old female who comes in for right hip pain since November 2023.  States that she had surgery on both hips 30 years ago.  Feels like she has a knife in her hip.  She has had back and hip injections and physical therapy.  Has a history of lower back surgeries by Dr. Franky Macho who she saw her recently and referred her to me. Review of Systems   Objective: Vital Signs: There were no vitals taken for this visit.  Physical Exam  Ortho Exam Examination of right hip shows negative logroll and negative trochanteric tenderness.  She has pain with hip range of motion. Specialty Comments:  No specialty comments available.  Imaging: XR HIP UNILAT W OR W/O PELVIS 2-3 VIEWS  RIGHT Result Date: 09/06/2023 X-rays of the right hip show no acute or structural abnormalities  XR Lumbar Spine 2-3 Views Result Date: 09/06/2023 X-rays of the lumbar spine show prior lumbar fusion with hardware.  No acute abnormalities.    PMFS History: Patient Active Problem List   Diagnosis Date Noted   CAD (coronary artery disease) 06/19/2018   At risk for sleep apnea 05/04/2017   Tachycardia 04/05/2017   Poor sleep pattern 10/20/2016   Fatigue 04/07/2016   Neck pain on left side 03/16/2016   Acute left-sided thoracic back pain 03/16/2016   Pain of left breast 10/30/2015   Lichen sclerosus 08/15/2014   Extremity edema 06/24/2014   Diplopia    History of coronary vasospasm 04/12/2014   Bursitis, trochanteric 11/22/2013   Essential hypertension 11/19/2013   Acute low back pain 08/10/2013   History of colonic polyps 02/23/2013   HNP (herniated nucleus pulposus), lumbar 12/01/2012   Lumbar radicular syndrome 12/01/2012   Seasonal allergies 11/30/2012   Intervertebral disc protrusion 11/06/2012   Situational anxiety 09/05/2012   Vitamin D insufficiency 06/30/2012   Unilateral hearing loss 06/30/2012   Vestibular dizziness 06/30/2012   Routine adult health maintenance 03/31/2012   Fibromuscular dysplasia (HCC) 08/13/2011  Dizziness and palpitations, episodic 07/06/2011   Poorly-controlled hypertension    History of CVA (cerebrovascular accident)    Hypothyroidism 04/10/2010   Dyslipidemia 04/10/2010   Anxiety 04/10/2010   Depression 04/10/2010   Migraine headache 04/10/2010   Renal artery stenosis (HCC) 04/10/2010   Fibromyalgia 04/10/2010   Status post lumbar surgery 04/10/2010   Backache 04/09/2010   Past Medical History:  Diagnosis Date   Adenomyosis    Anginal pain (HCC)    Anxiety 2001   Chest pain    a. No angiographic CAD by cath 2009 in [place]. There was catheter-induced RCA vasospasm versus microvascular obstruction. bEugenie Birks myoview 04/2010 - EF  79%, normal perfusion.;  c. ETT-Myoview 7/13: no ischemia, EF 77%, submax exercise   Chronic back pain    had 3 epidural injections on 02-14-13   Chronic kidney disease    Complication of anesthesia    has difficulty voiding after surgery   Coronary vasospasm (HCC)    CVA (cerebral vascular accident) (HCC) 2006   lost hearing on left   Depression 2001   Dysrhythmia    Fibromuscular dysplasia (HCC) 2009   a. Renal artery stenosis due to this. b. Carotid dopplers 07/2011 c/w FMD but no significant stenosis   Fibromyalgia 1999   H/O Graves' disease 2003   s/p radio-iodine treatment in 2003    Hearing loss of left ear    s/p CVA 2006   History of stent insertion of renal artery 2009   right renal stent   HLD (hyperlipidemia) 2000   HTN (hypertension) 1988   a. Likely related to RAS; urinary metanephrines and plasma renin/aldosterone ratio normal. b. H/o HTN urgency 06/2011 after being off BP meds for a number of days   Hx of echocardiogram    a. Echo (1/13): EF 55-60%, grade 1 diastolic dysfunction   Hypertensive crisis 06/29/2011   Hypothyroidism 2003   Graves disease s/p radio-iodine treatment in 2003    Irritable bowel syndrome    Leiomyoma    Migraine headache    Myocardial infarction [facility]) 2009   Personal history of colonic polyps-sessile serrated adenoma 02/23/2013   Renal artery stenosis (HCC)    a. Due to fibromuscular dysplasia. b. Renal stent 2009. c. Studies:  CTA abdomen (1/13) with evidence for fibromuscular dysplasia, right renal artery stent appears patent. Renal artery dopplers (2/13) with FMD but no evidence for signficant stenosis.    Tachycardia 2000   10/27/10-Holter -NSR with occ sinus tachy-rare PACs, no sig arrhythmia    Family History  Problem Relation Age of Onset   Heart disease Mother    Hypertension Mother    Asthma Mother    Hyperlipidemia Mother    Stroke Mother    Heart disease Father    Hypertension Father    Prostate cancer Father    Heart  attack Father    Hyperlipidemia Father    Stroke Father    Metabolic syndrome Sister    Depression Sister    Hypertension Sister    Hypertension Brother    Heart attack Brother 59   Early death Brother 35       massive stroke    Hyperlipidemia Brother    Diabetes Maternal Aunt    Stomach cancer Maternal Aunt    Stomach cancer Son    Heart attack Other    Stroke Other    Colon cancer Neg Hx    Colon polyps Neg Hx    Esophageal cancer Neg Hx  Rectal cancer Neg Hx     Past Surgical History:  Procedure Laterality Date   BACK SURGERY  2010 and 2011   In CT.  11/19/2008 (possibly a laminectomy) and in May of 2011 she underwent a L2-L3 fusion.   cardiac cath     CARDIAC CATHETERIZATION  2009   states it was normal   CARDIAC CATHETERIZATION N/A 02/03/2015   Procedure: Left Heart Cath and Coronary Angiography;  Surgeon: Laurey Morale, MD;  Location: [facility] INVASIVE CV LAB;  Service: Cardiovascular;  Laterality: N/A;   COLONOSCOPY  last 02/16/2013   COLONOSCOPY     HIP SURGERY Bilateral 1988 and 1989   "scraped head of femur"   KNEE ARTHROSCOPY Left    LUMBAR LAMINECTOMY/DECOMPRESSION MICRODISCECTOMY Left 05/25/2013   Procedure: LUMBAR LAMINECTOMY/DECOMPRESSION MICRODISCECTOMY 1 LEVEL;  Surgeon: Carmela Hurt, MD;  Location: [facility] NEURO ORS;  Service: Neurosurgery;  Laterality: Left;  LEFT L5S1 microdiskectomy   POLYPECTOMY     RENAL ARTERY STENT  2009   ROBOTIC ASSISTED LAP VAGINAL HYSTERECTOMY  05/13/2008   [facility]. da Vinci hysterectomy  for myomatous uterus    SPINE SURGERY     VAGINAL HYSTERECTOMY     Social History   Occupational History   Occupation: worked in a Museum/gallery conservator.    Employer: UNEMPLOYED    Comment: Quit about a yr ago because fo work related injury to left knee  Tobacco Use   Smoking status: Never   Smokeless tobacco: Never  Vaping Use   Vaping status: Never Used  Substance and Sexual Activity   Alcohol use: Not Currently    Comment: rarely   Drug use:  No   Sexual activity: Not on file

## 2023-09-11 NOTE — Progress Notes (Unsigned)
 Cardiology Office Note:    Date:  09/14/2023   ID:  [name] Rowe, DOB 1961/03/28, MRN [id number]  PCP:  Bailey Mech, PA-C   [facility] Providers Cardiologist:  Bowdy Bair [place], MD     Referring MD: Bernerd Pho*   Chief Complaint  Patient presents with   Coronary Artery Disease    History of Present Illness:    [name] Rowe is a 63 y.o. female seen at the request of Dr Rhunette Croft for evaluation of CAD. She was seen previously in our office by Dr Duke Salvia. Last seen by Joni Reining NP in June 2020. She has a history of prior stroke, hypertension, hyperlipidemia, fibromuscular dysplasia, coronary vasospasm, and inappropriate sinus tachycardia.   She had a stent placed in 2009 for renal artery stenosis/fibromuscular dysplasia.  She underwent a LHC at that time in [place] that showed catheter-induced right coronary spasm.  In 01/2015 she had another cath that reportedly showed 40-50% LAD disease. Stress Myoview in 2015 was normal. Echo in 2018 was unremarkable.   She reports she moved to [place] in 2021. She states she was experiencing a closing up in her throat and a sudden rush to her head. She had a Myoview study indicating anterior ischemia. She underwent cardiac cath in Feb 2022 showing  90% mid LAD stenosis that did not improve with Ntg. She had stenting using a 2.5 x 12 mm Onyx stent. She also had significant catheter induced spasm in the RCA. She had  a stress Myoview in [facility] in August 2023 which was normal with EF 87%.   She reports she had 3 back surgeries in 2021 and had a stroke following an epidural injection. 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 She moved back to [place] to be near her sister.   When last seen she complained of being very tired/exhausted all the time. Worse with exertion. She also has chest tightness that is fairly constant but also worse with exertion. She also had  leg pain with exertion. She  did have normal upper and lower EGD in Dec. She is a TEFL teacher witness. Lower extremity dopplers were normal. Cardiac PET CT was done last July and was normal.   On follow up today she is doing well from a cardiac standpoint. No chest pain or dyspnea. Still dealing with low back and hip pain. Planning on starting PT program today.   Past Medical History:  Diagnosis Date   Adenomyosis    Anginal pain (HCC)    Anxiety 2001   Chest pain    a. No angiographic CAD by cath 2009 in [place]. There was catheter-induced RCA vasospasm versus microvascular obstruction. bEugenie Birks myoview 04/2010 - EF 79%, normal perfusion.;  c. ETT-Myoview 7/13: no ischemia, EF 77%, submax exercise   Chronic back pain    had 3 epidural injections on 02-14-13   Chronic kidney disease    Complication of anesthesia    has difficulty voiding after surgery   Coronary vasospasm (HCC)    CVA (cerebral vascular accident) (HCC) 2006   lost hearing on left   Depression 2001   Dysrhythmia    Fibromuscular dysplasia (HCC) 2009   a. Renal artery stenosis due to this. b. Carotid dopplers 07/2011 c/w FMD but no significant stenosis   Fibromyalgia 1999   H/O Graves' disease 2003   s/p radio-iodine treatment in 2003    Hearing loss of left ear    s/p CVA 2006   History  of stent insertion of renal artery 2009   right renal stent   HLD (hyperlipidemia) 2000   HTN (hypertension) 1988   a. Likely related to RAS; urinary metanephrines and plasma renin/aldosterone ratio normal. b. H/o HTN urgency 06/2011 after being off BP meds for a number of days   Hx of echocardiogram    a. Echo (1/13): EF 55-60%, grade 1 diastolic dysfunction   Hypertensive crisis 06/29/2011   Hypothyroidism 2003   Graves disease s/p radio-iodine treatment in 2003    Irritable bowel syndrome    Leiomyoma    Migraine headache    Myocardial infarction [facility]) 2009   Personal history of colonic polyps-sessile serrated adenoma 02/23/2013   Renal artery  stenosis (HCC)    a. Due to fibromuscular dysplasia. b. Renal stent 2009. c. Studies:  CTA abdomen (1/13) with evidence for fibromuscular dysplasia, right renal artery stent appears patent. Renal artery dopplers (2/13) with FMD but no evidence for signficant stenosis.    Tachycardia 2000   10/27/10-Holter -NSR with occ sinus tachy-rare PACs, no sig arrhythmia    Past Surgical History:  Procedure Laterality Date   BACK SURGERY  2010 and 2011   In CT.  11/19/2008 (possibly a laminectomy) and in May of 2011 she underwent a L2-L3 fusion.   cardiac cath     CARDIAC CATHETERIZATION  2009   states it was normal   CARDIAC CATHETERIZATION N/A 02/03/2015   Procedure: Left Heart Cath and Coronary Angiography;  Surgeon: Laurey Morale, MD;  Location: [facility] INVASIVE CV LAB;  Service: Cardiovascular;  Laterality: N/A;   COLONOSCOPY  last 02/16/2013   COLONOSCOPY     HIP SURGERY Bilateral 1988 and 1989   "scraped head of femur"   KNEE ARTHROSCOPY Left    LUMBAR LAMINECTOMY/DECOMPRESSION MICRODISCECTOMY Left 05/25/2013   Procedure: LUMBAR LAMINECTOMY/DECOMPRESSION MICRODISCECTOMY 1 LEVEL;  Surgeon: Carmela Hurt, MD;  Location: [facility] NEURO ORS;  Service: Neurosurgery;  Laterality: Left;  LEFT L5S1 microdiskectomy   POLYPECTOMY     RENAL ARTERY STENT  2009   ROBOTIC ASSISTED LAP VAGINAL HYSTERECTOMY  05/13/2008   [facility] Vinc[facility] hysterectomy  for myomatous uterus    SPINE SURGERY     VAGINAL HYSTERECTOMY      Current Medications: Current Meds  Medication Sig   ezetimibe (ZETIA) 10 MG tablet Take 1 tablet (10 mg total) by mouth daily.   gabapentin (NEURONTIN) 100 MG capsule Take 300 mg by mouth at bedtime.   hydrOXYzine (VISTARIL) 25 MG capsule Take 25 mg by mouth at bedtime.   levothyroxine (SYNTHROID) 125 MCG tablet Take 125 mcg by mouth every other day.   LINZESS 290 MCG CAPS capsule TAKE 1 CAPSULE BY MOUTH DAILY   topiramate (TOPAMAX) 100 MG tablet Take 100 mg by mouth daily.   venlafaxine XR  (EFFEXOR-XR) 75 MG 24 hr capsule Take 75 mg by mouth daily.   Vitamin D, Ergocalciferol, (DRISDOL) 1.25 MG (50000 UNIT) CAPS capsule Take 50,000 Units by mouth every Sunday.   [DISCONTINUED] amLODipine (NORVASC) 5 MG tablet Take 5 mg by mouth daily.   [DISCONTINUED] metoprolol succinate (TOPROL-XL) 100 MG 24 hr tablet TAKE 1 TABLET BY MOUTH AT BEDTIME (DO  NOT  CRUSH)(BETA  BLOCKER)  NEEDS  APPOINTMENT   [DISCONTINUED] nitroGLYCERIN (NITROSTAT) 0.4 MG SL tablet Place 1 tablet (0.4 mg total) under the tongue every 5 (five) minutes as needed. x3 doses as needed for chest pain   [DISCONTINUED] rosuvastatin (CRESTOR) 40 MG tablet Take 1 tablet (40 mg  total) by mouth daily.     Allergies:   Hydromorphone hcl, Prochlorperazine edisylate, Sumatriptan, Atorvastatin, Lisinopril, and Tramadol   Social History   Socioeconomic History   Marital status: Divorced    Spouse name: Not on file   Number of children: 2   Years of education: Not on file   Highest education level: Not on file  Occupational History   Occupation: worked in a Museum/gallery conservator.    Employer: UNEMPLOYED    Comment: Quit about a yr ago because fo work related injury to left knee  Tobacco Use   Smoking status: Never   Smokeless tobacco: Never  Vaping Use   Vaping status: Never Used  Substance and Sexual Activity   Alcohol use: Not Currently    Comment: rarely   Drug use: No   Sexual activity: Not on file  Other Topics Concern   Not on file  Social History Narrative   Second Husband is currently in jail for an offense committed in [place] and he is awaiting extradition.   Lives alone.    Jehovah's witness.    Walks 20-30 minutes.                Social Drivers of Health   Financial Resource Strain: Low Risk  (12/07/2021)   Received from [facility], [facility] visits prior to 08/07/2022., [facility] visits prior to 08/07/2022[facility]   Overall Financial  Resource Strain (CARDIA)    Difficulty of Paying Living Expenses: Not very hard  Food Insecurity: Medium Risk (08/03/2022)   [facility]   Hunger Vital Sign    Worried About Running Out of Food in the Last Year: Sometimes true    Within the past 12 months, the food you bought just didn't last and you didn't have money to get more: Not on file  Transportation Needs: No Transportation Needs (08/03/2022)   Received from [facility] visits prior to 08/07/2022., [facility] visits prior to 08/07/2022.   Transportation    In the past 12 months, has lack of reliable transportation kept you from medical appointments, meetings, work or from getting things needed for daily living?: No  Physical Activity: Inactive (12/07/2021)   [facility], [facility] visits prior to 08/07/2022., [facility] visits prior to 08/07/2022., [facility]   Exercise Vital Sign    Days of Exercise per Week: 0 days    Minutes of Exercise per Session: 10 min  Stress: Stress Concern Present (12/07/2021)   Received from [facility], [facility] visits prior to 08/07/2022., [facility] visits prior to 08/07/2022[facility]   [employer] of Occupational Health - Occupational Stress Questionnaire    Feeling of Stress : Very much  Social Connections: Moderately Integrated (12/07/2021)   [facility], [facility] prior to 08/07/2022[facility] visits prior to 08/07/2022.   Social Connection and Isolation Panel [NHANES]    Frequency of Communication with Friends and Family: More than three times a week    Frequency of Social Gatherings with Friends and Family: Three times a week    Attends Religious Services: More than 4 times per year    Active Member of Clubs or Organizations: Yes     Attends Banker Meetings: More than  4 times per year    Marital Status: Divorced     Family History: The patient's family history includes Asthma in her mother; Depression in her sister; Diabetes in her maternal aunt; Early death (age of onset: 50) in her brother; Heart attack in her father and another family member; Heart attack (age of onset: 34) in her brother; Heart disease in her father and mother; Hyperlipidemia in her brother, father, and mother; Hypertension in her brother, father, mother, and sister; Metabolic syndrome in her sister; Prostate cancer in her father; Stomach cancer in her maternal aunt and son; Stroke in her father, mother, and another family member. There is no history of Colon cancer, Colon polyps, Esophageal cancer, or Rectal cancer.  ROS:   Please see the history of present illness.     All other systems reviewed and are negative.  EKGs/Labs/Other Studies Reviewed:    The following studies were reviewed today: Myoview 04/14/14: IMPRESSION: 1. No scintigraphic evidence of prior infarction or pharmacologically induced ischemia.   2. Normal left ventricular wall motion.   3. Left ventricular ejection fraction 79%   4. Low-risk stress test findings*.    Cardiac cath 02/03/15:  Left Heart Cath and Coronary Angiography   Conclusion  1. 40-50% proximal LAD stenosis after take-off of moderate D2.  By itself, I do not think this lesion should be causing her symptoms.  We gave her NTG IA and the lesion did not change.  Superimposed vasoconstriction in this area could certainly cause her symptoms.  2. Radial artery spasm during the procedure necessitated robust sedation and use of intra-arterial NTG.    Would treat for vasospasm.  She is already on CCB.  I will add Imdur 15 mg daily to see if she can tolerate a low dose of chronic nitrate (had headache in the past with Imdur).   Echo 08/31/16: Study Conclusions   - Left ventricle: The cavity size was  normal. Wall thickness was    normal. Systolic function was normal. The estimated ejection    fraction was in the range of 60% to 65%. Wall motion was normal;    there were no regional wall motion abnormalities. Doppler    parameters are consistent with abnormal left ventricular    relaxation (grade 1 diastolic dysfunction).  - Pericardium, extracardiac: A trivial pericardial effusion was    identified.   Impressions:   - Normal LV systolic function; grade 1 diastolic dysfunction;    mildly dilated ascending aorta (3.8 cm).   Holter monitor 12/14/16: Study Highlights  Minimum HR: 62 BPM at 4:19:32 AM(2) Maximum HR: 147 BPM at 12:01:01 PM Average HR: 92 BPM Sinus rhythm and sinus tachycardia seen Dizziness, palpitations, chest pain associated with sinus tachycardia   Will Elberta Fortis, MD  PET CT 01/04/23:   The study is normal. The study is low risk.   LV perfusion is normal.   Stress left ventricular function is normal. Stress EF: 68%. End diastolic cavity size is normal. End systolic cavity size is normal. Rest EF not reported due to significant blood pool artifact on imaging rendering calculation inaccurate. TID also inaccurate due to blood pool activity at rest making measurement inaccurate. Given reassuring stress perfusion and normal flow, this study is deemed normal.   Myocardial blood flow was computed to be 0.47ml/g/min at rest and 2.54ml/g/min at stress. Global myocardial blood flow reserve was 2.90 and was normal.   Coronary calcium assessment not performed due to prior revascularization.   Electronically signed: Laurance Flatten,  MD  EKG:  EKG done 06/13/23 is normal. I have personally reviewed and interpreted this study.    Recent Labs: 03/21/2023: B Natriuretic Peptide 83.1 08/27/2023: ALT 120; BUN 15; Creatinine, Ser 1.11; Hemoglobin 15.2; Platelets 344; Potassium 4.8; Sodium 141  Recent Lipid Panel    Component Value Date/Time   CHOL 161 12/01/2018 1008   TRIG 146  12/01/2018 1008   HDL 48 12/01/2018 1008   CHOLHDL 3.4 12/01/2018 1008   CHOLHDL 5.8 08/30/2016 0858   VLDL 40 08/30/2016 0858   LDLCALC 84 12/01/2018 1008   LDLDIRECT 147.4 06/12/2013 0943     Risk Assessment/Calculations:       Physical Exam:    VS:  BP (!) 120/98 (BP Location: Right Arm, Patient Position: Sitting, Cuff Size: Normal)   Pulse 72   Ht 5\' 4"  (1.626 m)   Wt 138 lb 6.4 oz (62.8 kg)   SpO2 97%   BMI 23.76 kg/m     Wt Readings from Last 3 Encounters:  09/14/23 138 lb 6.4 oz (62.8 kg)  08/27/23 140 lb (63.5 kg)  06/13/23 140 lb (63.5 kg)     GEN:  Well nourished, well developed in no acute distress HEENT: Normal NECK: No JVD; No carotid bruits LYMPHATICS: No lymphadenopathy CARDIAC: RRR, no murmurs, rubs, gallops. Radial and pedal pulses are poor.  RESPIRATORY:  Clear to auscultation without rales, wheezing or rhonchi  ABDOMEN: Soft, non-tender, non-distended MUSCULOSKELETAL:  No edema; No deformity  SKIN: Warm and dry NEUROLOGIC:  Alert and oriented x 3 PSYCHIATRIC:  Normal affect   ASSESSMENT:    1. Coronary artery disease of native artery of native heart with stable angina pectoris (HCC)   2. Renal artery stenosis (HCC)   3. Hypercholesteremia   4. Primary hypertension     PLAN:    In order of problems listed above:  CAD with prior stent of mid LAD in Feb 2022 with 2.5 x 12 Onyx stent. Has history of catheter induced spasm of RCA on more than one occasion. PET CT in July 2024 was normal.  Continue Rx with ASA, statin, Toprol and amlodipine. Medications refilled today Leg pain. Normal LE arterial dopplers. Suspect more related to orthopedic issues.  RAS s/p remote stent. BP improved.  HLD on high dose Crestor and Zetia. Last LDL 69 HTN controlled.  History of CVA.  Fibromuscular dysplasia Jehovah's witness.     Medication Adjustments/Labs and Tests Ordered: Current medicines are reviewed at length with the patient today.  Concerns regarding  medicines are outlined above.  No orders of the defined types were placed in this encounter.  Meds ordered this encounter  Medications   metoprolol succinate (TOPROL-XL) 100 MG 24 hr tablet    Sig: Take 1 tablet (100 mg total) by mouth daily. Take with or immediately following a meal.    Dispense:  90 tablet    Refill:  3   nitroGLYCERIN (NITROSTAT) 0.4 MG SL tablet    Sig: Place 1 tablet (0.4 mg total) under the tongue every 5 (five) minutes as needed. x3 doses as needed for chest pain    Dispense:  25 tablet    Refill:  3   rosuvastatin (CRESTOR) 40 MG tablet    Sig: Take 1 tablet (40 mg total) by mouth daily.    Dispense:  90 tablet    Refill:  3   amLODipine (NORVASC) 5 MG tablet    Sig: Take 1 tablet (5 mg total) by mouth daily.  Dispense:  90 tablet    Refill:  3   ezetimibe (ZETIA) 10 MG tablet    Sig: Take 1 tablet (10 mg total) by mouth daily.    Dispense:  90 tablet    Refill:  3    There are no Patient Instructions on file for this visit.   Will follow up in one year  Signed, Tyjai Matuszak [place], MD  09/14/2023 9:38 AM    [facility]

## 2023-09-14 ENCOUNTER — Ambulatory Visit: Payer: Medicaid Other | Attending: Cardiology | Admitting: Cardiology

## 2023-09-14 ENCOUNTER — Encounter: Payer: Self-pay | Admitting: Cardiology

## 2023-09-14 VITALS — BP 120/98 | HR 72 | Ht 64.0 in | Wt 138.4 lb

## 2023-09-14 DIAGNOSIS — I10 Essential (primary) hypertension: Secondary | ICD-10-CM

## 2023-09-14 DIAGNOSIS — I25118 Atherosclerotic heart disease of native coronary artery with other forms of angina pectoris: Secondary | ICD-10-CM

## 2023-09-14 DIAGNOSIS — I701 Atherosclerosis of renal artery: Secondary | ICD-10-CM

## 2023-09-14 DIAGNOSIS — E7800 Pure hypercholesterolemia, unspecified: Secondary | ICD-10-CM | POA: Diagnosis not present

## 2023-09-14 MED ORDER — EZETIMIBE 10 MG PO TABS: 10.0000 mg | ORAL_TABLET | Freq: Every day | ORAL | 3 refills | Status: DC

## 2023-09-14 MED ORDER — NITROGLYCERIN 0.4 MG SL SUBL: 0.4000 mg | SUBLINGUAL_TABLET | SUBLINGUAL | 3 refills | Status: AC | PRN

## 2023-09-14 MED ORDER — AMLODIPINE BESYLATE 5 MG PO TABS: 5.0000 mg | ORAL_TABLET | Freq: Every day | ORAL | 3 refills | Status: AC

## 2023-09-14 MED ORDER — ROSUVASTATIN CALCIUM 40 MG PO TABS: 40.0000 mg | ORAL_TABLET | Freq: Every day | ORAL | 3 refills | Status: AC

## 2023-09-14 MED ORDER — METOPROLOL SUCCINATE ER 100 MG PO TB24: 100.0000 mg | ORAL_TABLET | Freq: Every day | ORAL | 3 refills | Status: AC

## 2023-09-14 NOTE — Patient Instructions (Signed)
 Medication Instructions:  Continue same medications *If you need a refill on your cardiac medications before your next appointment, please call your pharmacy*  Lab Work: None ordered  Testing/Procedures: None ordered  Follow-Up: At [facility], you and your health needs are our priority.  As part of our continuing mission to provide you with exceptional heart care, our providers are all part of one team.  This team includes your primary Cardiologist (physician) and Advanced Practice Providers or APPs (Physician Assistants and Nurse Practitioners) who all work together to provide you with the care you need, when you need it.  Your next appointment:  1 year    Call in Dec to schedule April appointment     Provider:  Dr.Jordan   We recommend signing up for the patient portal called "MyChart".  Sign up information is provided on this After Visit Summary.  MyChart is used to connect with patients for Virtual Visits (Telemedicine).  Patients are able to view lab/test results, encounter notes, upcoming appointments, etc.  Non-urgent messages can be sent to your provider as well.   To learn more about what you can do with MyChart, go to [email or web address].       1st Floor: - Lobby - Registration  - Pharmacy  - Lab - Cafe  2nd Floor: - PV Lab - Diagnostic Testing (echo, CT, nuclear med)  3rd Floor: - Vacant  4th Floor: - TCTS (cardiothoracic surgery) - AFib Clinic - Structural Heart Clinic - Vascular Surgery  - Vascular Ultrasound  5th Floor: - HeartCare Cardiology (general and EP) - Clinical Pharmacy for coumadin, hypertension, lipid, weight-loss medications, and med management appointments    Valet parking services will be available as well.

## 2023-09-15 ENCOUNTER — Other Ambulatory Visit: Payer: Self-pay

## 2023-09-15 ENCOUNTER — Encounter: Payer: Self-pay | Admitting: Physical Therapy

## 2023-09-15 ENCOUNTER — Ambulatory Visit: Attending: Orthopaedic Surgery | Admitting: Physical Therapy

## 2023-09-15 DIAGNOSIS — R2681 Unsteadiness on feet: Secondary | ICD-10-CM | POA: Insufficient documentation

## 2023-09-15 DIAGNOSIS — M25551 Pain in right hip: Secondary | ICD-10-CM | POA: Insufficient documentation

## 2023-09-15 DIAGNOSIS — M6281 Muscle weakness (generalized): Secondary | ICD-10-CM | POA: Diagnosis present

## 2023-09-15 NOTE — Therapy (Signed)
 OUTPATIENT PHYSICAL THERAPY LOWER EXTREMITY EVALUATION  Patient Name: [name] Rowe MRN: [id number] DOB:September 19, 1960, 63 y.o., female Today's Date: 09/15/2023   Rowe End of Session - 09/15/23 1103     Visit Number 1    Number of Visits --   1-2x/week   Date for Rowe Re-Evaluation 11/10/23    Authorization Type [employer] - LEFS    Rowe Start Time 0930    Rowe Stop Time 1007    Rowe Time Calculation (min) 37 min             Past Medical History:  Diagnosis Date   Adenomyosis    Anginal pain (HCC)    Anxiety 2001   Chest pain    a. No angiographic CAD by cath 2009 in [place]. There was catheter-induced RCA vasospasm versus microvascular obstruction. bEugenie Birks myoview 04/2010 - EF 79%, normal perfusion.;  c. ETT-Myoview 7/13: no ischemia, EF 77%, submax exercise   Chronic back pain    had 3 epidural injections on 02-14-13   Chronic kidney disease    Complication of anesthesia    has difficulty voiding after surgery   Coronary vasospasm (HCC)    CVA (cerebral vascular accident) (HCC) 2006   lost hearing on left   Depression 2001   Dysrhythmia    Fibromuscular dysplasia (HCC) 2009   a. Renal artery stenosis due to this. b. Carotid dopplers 07/2011 c/w FMD but no significant stenosis   Fibromyalgia 1999   H/O Graves' disease 2003   s/p radio-iodine treatment in 2003    Hearing loss of left ear    s/p CVA 2006   History of stent insertion of renal artery 2009   right renal stent   HLD (hyperlipidemia) 2000   HTN (hypertension) 1988   a. Likely related to RAS; urinary metanephrines and plasma renin/aldosterone ratio normal. b. H/o HTN urgency 06/2011 after being off BP meds for a number of days   Hx of echocardiogram    a. Echo (1/13): EF 55-60%, grade 1 diastolic dysfunction   Hypertensive crisis 06/29/2011   Hypothyroidism 2003   Graves disease s/p radio-iodine treatment in 2003    Irritable bowel syndrome    Leiomyoma    Migraine headache    Myocardial infarction [facility])  2009   Personal history of colonic polyps-sessile serrated adenoma 02/23/2013   Renal artery stenosis (HCC)    a. Due to fibromuscular dysplasia. b. Renal stent 2009. c. Studies:  CTA abdomen (1/13) with evidence for fibromuscular dysplasia, right renal artery stent appears patent. Renal artery dopplers (2/13) with FMD but no evidence for signficant stenosis.    Tachycardia 2000   10/27/10-Holter -NSR with occ sinus tachy-rare PACs, no sig arrhythmia   Past Surgical History:  Procedure Laterality Date   BACK SURGERY  2010 and 2011   In CT.  11/19/2008 (possibly a laminectomy) and in May of 2011 she underwent a L2-L3 fusion.   cardiac cath     CARDIAC CATHETERIZATION  2009   states it was normal   CARDIAC CATHETERIZATION N/A 02/03/2015   Procedure: Left Heart Cath and Coronary Angiography;  Surgeon: Laurey Morale, MD;  Location: [facility] INVASIVE CV LAB;  Service: Cardiovascular;  Laterality: N/A;   COLONOSCOPY  last 02/16/2013   COLONOSCOPY     HIP SURGERY Bilateral 1988 and 1989   "scraped head of femur"   KNEE ARTHROSCOPY Left    LUMBAR LAMINECTOMY/DECOMPRESSION MICRODISCECTOMY Left 05/25/2013   Procedure: LUMBAR LAMINECTOMY/DECOMPRESSION MICRODISCECTOMY 1 LEVEL;  Surgeon: Ronaldo Miyamoto  Aviva Kluver, MD;  Location: [facility] NEURO ORS;  Service: Neurosurgery;  Laterality: Left;  LEFT L5S1 microdiskectomy   POLYPECTOMY     RENAL ARTERY STENT  2009   ROBOTIC ASSISTED LAP VAGINAL HYSTERECTOMY  05/13/2008   [facility] da Vinci hysterectomy  for myomatous uterus    SPINE SURGERY     VAGINAL HYSTERECTOMY     Patient Active Problem List   Diagnosis Date Noted   CAD (coronary artery disease) 06/19/2018   At risk for sleep apnea 05/04/2017   Tachycardia 04/05/2017   Poor sleep pattern 10/20/2016   Fatigue 04/07/2016   Neck pain on left side 03/16/2016   Acute left-sided thoracic back pain 03/16/2016   Pain of left breast 10/30/2015   Lichen sclerosus 08/15/2014   Extremity edema 06/24/2014   Diplopia    History  of coronary vasospasm 04/12/2014   Bursitis, trochanteric 11/22/2013   Essential hypertension 11/19/2013   Acute low back pain 08/10/2013   History of colonic polyps 02/23/2013   HNP (herniated nucleus pulposus), lumbar 12/01/2012   Lumbar radicular syndrome 12/01/2012   Seasonal allergies 11/30/2012   Intervertebral disc protrusion 11/06/2012   Situational anxiety 09/05/2012   Vitamin D insufficiency 06/30/2012   Unilateral hearing loss 06/30/2012   Vestibular dizziness 06/30/2012   Routine adult health maintenance 03/31/2012   Fibromuscular dysplasia (HCC) 08/13/2011   Dizziness and palpitations, episodic 07/06/2011   Poorly-controlled hypertension    History of CVA (cerebrovascular accident)    Hypothyroidism 04/10/2010   Dyslipidemia 04/10/2010   Anxiety 04/10/2010   Depression 04/10/2010   Migraine headache 04/10/2010   Renal artery stenosis (HCC) 04/10/2010   Fibromyalgia 04/10/2010   Status post lumbar surgery 04/10/2010   Backache 04/09/2010    PCP: Bailey Mech, PA-C  REFERRING PROVIDER: Tarry Kos, MD  THERAPY DIAG:  Pain in right hip - Plan: Rowe plan of care cert/re-cert  Muscle weakness - Plan: Rowe plan of care cert/re-cert  Unsteadiness on feet - Plan: Rowe plan of care cert/re-cert  REFERRING DIAG: Pain in right hip [M25.551]   Rationale for Evaluation and Treatment:  Rehabilitation  SUBJECTIVE:  PERTINENT PAST HISTORY:  Anxiety, depression, fibromyalgia, CVA (2007, 2022), CAD, lumbar fusion, MI 2009, OCD      PRECAUTIONS: None  WEIGHT BEARING RESTRICTIONS No  FALLS:  Has patient fallen in last 6 months? No, Number of falls: 0  MOI/History of condition:  Onset date: November 2023  SUBJECTIVE STATEMENT  [name] Rowe is a 63 y.o. female who presents to clinic with chief complaint of R hip pain.  This started in suddenly with no acute pain or injury.  Recent MRI shows torn anterior superior labrum.     Red flags:  denies    Pain:  Are you having pain? Yes Pain location: R hip pain NPRS scale:  4/10 to 10/10 Aggravating factors: sitting, standing, walking Relieving factors: hip flexion Pain description: sharp, stabbing, and aching Stage: Chronic   Occupation: NA  Assistive Device: na  Hand Dominance: na  Patient Goals/Specific Activities: reduce pain, avoid surgery if possible, clean   OBJECTIVE:   DIAGNOSTIC FINDINGS:  Recent MRI shows torn anterior superior labrum  GENERAL OBSERVATION/GAIT: Reduced time in stance R hip, antalgic gait  SENSATION: Light touch: Appears intact  LE MMT:  MMT Right (Eval) Left (Eval)  Hip flexion (L2, L3) 3* 4  Knee extension (L3) 3+* 4  Knee flexion 3+ 4  Hip abduction 3-* 3+  Hip extension    Hip external rotation  3*   Hip internal rotation 3*   Hip adduction    Ankle dorsiflexion (L4)    Ankle plantarflexion (S1)    Ankle inversion    Ankle eversion    Great Toe ext (L5)    Grossly     (Blank rows = not tested, score listed is out of 5 possible points.  N = WNL, D = diminished, C = clear for gross weakness with myotome testing, * = concordant pain with testing)  LE ROM:  ROM Right (Eval) Left (Eval)  Hip flexion    Hip extension    Hip abduction    Hip adduction    Hip internal rotation Limited painful   Hip external rotation N painful   Knee extension    Knee flexion    Ankle dorsiflexion    Ankle plantarflexion    Ankle inversion    Ankle eversion     (Blank rows = not tested, N = WNL, * = concordant pain with testing)  Functional Tests  Eval    30'' STS: 13x  UE used? N                                                          PATIENT SURVEYS:  LEFS: 45/80   TODAY'S TREATMENT: Therapeutic Exercise: Creating, reviewing, and completing below HEP  PATIENT EDUCATION ([place]/HM):  POC, diagnosis, prognosis, HEP, and outcome measures.  Rowe educated via explanation, demonstration, and handout (HEP).  Rowe  confirms understanding verbally.   HOME EXERCISE PROGRAM: Access Code: [email or web address] URL: [email or web address] Date: 09/15/2023 Prepared by: Alphonzo Severance  Exercises - Supine Hip Adduction Isometric with Ball  - 1 x daily - 7 x weekly - 2 sets - 10 reps - 10'' hold - Hooklying Clamshell with Resistance  - 1 x daily - 7 x weekly - 3 sets - 10 reps - Bridge  - 1 x daily - 7 x weekly - 3 sets - 10 reps - 5-10'' hold  Treatment priorities   Eval        Gentle progressive hip strengthening        Aquatic therapy        Look at balance                          ASSESSMENT:  CLINICAL IMPRESSION: [name] Rowe is a 63 y.o. female who presents to clinic with signs and sxs consistent with R hip pain with known acetabular labral tear.  This is chronic and has been going on for years.  MD recommends THA but Rowe is too young.  Given high levels of pain and hx of fibromyalgia we will try a combination of land and aquatic exercise.  [name] Rowe to address relevant deficits and improve comfort in daily tasks including cleaning.   OBJECTIVE IMPAIRMENTS: Pain, R hip ROM, bil hip and LE strength, gait  ACTIVITY LIMITATIONS: bending, squatting, tranfers, housework, lifting  PERSONAL FACTORS: See medical history and pertinent history   REHAB POTENTIAL: Fair chronic with severe pain; see medical history  CLINICAL DECISION MAKING: Evolving/moderate complexity  EVALUATION COMPLEXITY: Moderate   GOALS:   SHORT TERM GOALS: Target date: 10/13/2023   [name] Rowe will be >75% HEP compliant to improve carryover between sessions and facilitate independent management  of condition  Evaluation: ongoing Goal status: INITIAL   LONG TERM GOALS: Target date: 10/13/2023   [name] Rowe will self report >/= 50% decrease in pain from evaluation to improve function in daily tasks  Evaluation/Baseline: 10/10 max pain Goal status: INITIAL   2.  [name] Rowe will improve the  following MMTs to >/= 4/5 to show improvement in strength:     Evaluation/Baseline:   LE MMT:  MMT Right (Eval) Left (Eval)  Hip flexion (L2, L3) 3* 4  Knee extension (L3) 3+* 4  Knee flexion 3+ 4  Hip abduction 3-* 3+  Hip extension    Hip external rotation 3*   Hip internal rotation 3*   Hip adduction    Ankle dorsiflexion (L4)    Ankle plantarflexion (S1)    Ankle inversion    Ankle eversion    Great Toe ext (L5)    Grossly     (Blank rows = not tested, score listed is out of 5 possible points.  N = WNL, D = diminished, C = clear for gross weakness with myotome testing, * = concordant pain with testing)  Goal status: INITIAL   3.  [name] Rowe will be able to clean her house, not limited by pain  Evaluation/Baseline: limited Goal status: INITIAL   4.  [name] Rowe will show a >/= 18 Rowe improvement in LEFS score (MCID is ~11% or 9 pts) as a proxy for functional improvement   Evaluation/Baseline: 45 pts Goal status: INITIAL   5.  [name] Rowe will be able to walk outside for exercise and take pictures, not limited by pain  Evaluation/Baseline: limited Goal status: INITIAL   PLAN: Rowe FREQUENCY: 1-2x/week  Rowe DURATION: 8 weeks  PLANNED INTERVENTIONS:  97164- Rowe Re-evaluation, 97110-Therapeutic exercises, 97530- Therapeutic activity, [id number]- Neuromuscular re-education, 97535- Self Care, [id number]- Manual therapy, [id number]- Gait training, [id number]- Aquatic Therapy, [phone]- Electrical stimulation (manual), [id number]- Vasopneumatic device, [id number]- Traction (mechanical), [id number]- Ionotophoresis 4mg /ml Dexamethasone, Taping, Dry Needling, Joint manipulation, and Spinal manipulation.   Alphonzo Severance Rowe, DPT 09/15/2023, 11:12 AM

## 2023-09-15 NOTE — Patient Instructions (Signed)

## 2023-09-27 ENCOUNTER — Ambulatory Visit: Admitting: Physical Therapy

## 2023-09-27 ENCOUNTER — Encounter: Payer: Self-pay | Admitting: Physical Therapy

## 2023-09-27 DIAGNOSIS — M25551 Pain in right hip: Secondary | ICD-10-CM

## 2023-09-27 DIAGNOSIS — M6281 Muscle weakness (generalized): Secondary | ICD-10-CM

## 2023-09-27 DIAGNOSIS — R2681 Unsteadiness on feet: Secondary | ICD-10-CM

## 2023-09-27 NOTE — Therapy (Signed)
 OUTPATIENT PHYSICAL THERAPY LOWER EXTREMITY TREATMENT  Patient Name: [name] Rowe MRN: [id number] DOB:[birth year], 63 y.o., female Today's Date: 09/27/2023   PT End of Session - 09/27/23 1144     Visit Number 2    Number of Visits 17    Date for PT Re-Evaluation 11/10/23    PT Start Time 1143    PT Stop Time 1221    PT Time Calculation (min) 38 min             Past Medical History:  Diagnosis Date   Adenomyosis    Anginal pain (HCC)    Anxiety 2001   Chest pain    a. No angiographic CAD by cath 2009 in [place]. There was catheter-induced RCA vasospasm versus microvascular obstruction. b. Lexiscan  myoview  04/2010 - EF 79%, normal perfusion.;  c. ETT-Myoview  7/13: no ischemia, EF 77%, submax exercise   Chronic back pain    had 3 epidural injections on 02-14-13   Chronic kidney disease    Complication of anesthesia    has difficulty voiding after surgery   Coronary vasospasm (HCC)    CVA (cerebral vascular accident) (HCC) 2006   lost hearing on left   Depression 2001   Dysrhythmia    Fibromuscular dysplasia (HCC) 2009   a. Renal artery stenosis due to this. b. Carotid dopplers 07/2011 c/w FMD but no significant stenosis   Fibromyalgia 1999   H/O Graves' disease 2003   s/p radio-iodine treatment in 2003    Hearing loss of left ear    s/p CVA 2006   History of stent insertion of renal artery 2009   right renal stent   HLD (hyperlipidemia) 2000   HTN (hypertension) 1988   a. Likely related to RAS; urinary metanephrines and plasma renin/aldosterone ratio normal. b. H/o HTN urgency 06/2011 after being off BP meds for a number of days   Hx of echocardiogram    a. Echo (1/13): EF 55-60%, grade 1 diastolic dysfunction   Hypertensive crisis 06/29/2011   Hypothyroidism 2003   Graves disease s/p radio-iodine treatment in 2003    Irritable bowel syndrome    Leiomyoma    Migraine headache    Myocardial infarction [facility]) 2009   Personal history of colonic polyps-sessile  serrated adenoma 02/23/2013   Renal artery stenosis (HCC)    a. Due to fibromuscular dysplasia. b. Renal stent 2009. c. Studies:  CTA abdomen (1/13) with evidence for fibromuscular dysplasia, right renal artery stent appears patent. Renal artery dopplers (2/13) with FMD but no evidence for signficant stenosis.    Tachycardia 2000   10/27/10-Holter -NSR with occ sinus tachy-rare PACs, no sig arrhythmia   Past Surgical History:  Procedure Laterality Date   BACK SURGERY  2010 and 2011   In CT.  11/19/2008 (possibly a laminectomy) and in May of 2011 she underwent a L2-L3 fusion.   cardiac cath     CARDIAC CATHETERIZATION  2009   states it was normal   CARDIAC CATHETERIZATION N/A 02/03/2015   Procedure: Left Heart Cath and Coronary Angiography;  Surgeon: Darlis Eisenmenger, MD;  Location: [facility] INVASIVE CV LAB;  Service: Cardiovascular;  Laterality: N/A;   COLONOSCOPY  last 02/16/2013   COLONOSCOPY     HIP SURGERY Bilateral 1988 and 1989   "scraped head of femur"   KNEE ARTHROSCOPY Left    LUMBAR LAMINECTOMY/DECOMPRESSION MICRODISCECTOMY Left 05/25/2013   Procedure: LUMBAR LAMINECTOMY/DECOMPRESSION MICRODISCECTOMY 1 LEVEL;  Surgeon: Pasty Bongo, MD;  Location: [facility] NEURO ORS;  Service: Neurosurgery;  Laterality: Left;  LEFT L5S1 microdiskectomy   POLYPECTOMY     RENAL ARTERY STENT  2009   ROBOTIC ASSISTED LAP VAGINAL HYSTERECTOMY  05/13/2008   [facility] Vinci hysterectomy  for myomatous uterus    SPINE SURGERY     VAGINAL HYSTERECTOMY     Patient Active Problem List   Diagnosis Date Noted   CAD (coronary artery disease) 06/19/2018   At risk for sleep apnea 05/04/2017   Tachycardia 04/05/2017   Poor sleep pattern 10/20/2016   Fatigue 04/07/2016   Neck pain on left side 03/16/2016   Acute left-sided thoracic back pain 03/16/2016   Pain of left breast 10/30/2015   Lichen sclerosus 08/15/2014   Extremity edema 06/24/2014   Diplopia    History of coronary vasospasm 04/12/2014   Bursitis,  trochanteric 11/22/2013   Essential hypertension 11/19/2013   Acute low back pain 08/10/2013   History of colonic polyps 02/23/2013   HNP (herniated nucleus pulposus), lumbar 12/01/2012   Lumbar radicular syndrome 12/01/2012   Seasonal allergies 11/30/2012   Intervertebral disc protrusion 11/06/2012   Situational anxiety 09/05/2012   Vitamin D  insufficiency 06/30/2012   Unilateral hearing loss 06/30/2012   Vestibular dizziness 06/30/2012   Routine adult health maintenance 03/31/2012   Fibromuscular dysplasia (HCC) 08/13/2011   Dizziness and palpitations, episodic 07/06/2011   Poorly-controlled hypertension    History of CVA (cerebrovascular accident)    Hypothyroidism 04/10/2010   Dyslipidemia 04/10/2010   Anxiety 04/10/2010   Depression 04/10/2010   Migraine headache 04/10/2010   Renal artery stenosis (HCC) 04/10/2010   Fibromyalgia 04/10/2010   Status post lumbar surgery 04/10/2010   Backache 04/09/2010    PCP: Arch Beans, PA-C  REFERRING PROVIDER: Wes Hamman, MD  THERAPY DIAG:  Pain in right hip  Muscle weakness  Unsteadiness on feet  REFERRING DIAG: Pain in right hip [M25.551]   Rationale for Evaluation and Treatment:  Rehabilitation  SUBJECTIVE:  PERTINENT PAST HISTORY:  Anxiety, depression, fibromyalgia, CVA (2007, 2022), CAD, lumbar fusion, MI 2009, OCD      PRECAUTIONS: None  WEIGHT BEARING RESTRICTIONS No  FALLS:  Has patient fallen in last 6 months? No, Number of falls: 0  MOI/History of condition:  Onset date: November 2023  SUBJECTIVE STATEMENT Pt reports constant pain. Feels like a knife sometimes.    [name] Rowe is a 63 y.o. female who presents to clinic with chief complaint of R hip pain.  This started in suddenly with no acute pain or injury.  Recent MRI shows torn anterior superior labrum.     Red flags:  denies   Pain:  Are you having pain? Yes Pain location: R hip pain NPRS scale:  8/10 to  10/10 Aggravating factors: sitting, standing, walking Relieving factors: hip flexion Pain description: sharp, stabbing, and aching Stage: Chronic   Occupation: NA  Assistive Device: na  Hand Dominance: na  Patient Goals/Specific Activities: reduce pain, avoid surgery if possible, clean   OBJECTIVE:   DIAGNOSTIC FINDINGS:  Recent MRI shows torn anterior superior labrum  GENERAL OBSERVATION/GAIT: Reduced time in stance R hip, antalgic gait  SENSATION: Light touch: Appears intact  LE MMT:  MMT Right (Eval) Left (Eval)  Hip flexion (L2, L3) 3* 4  Knee extension (L3) 3+* 4  Knee flexion 3+ 4  Hip abduction 3-* 3+  Hip extension    Hip external rotation 3*   Hip internal rotation 3*   Hip adduction    Ankle dorsiflexion (L4)    Ankle plantarflexion (  S1)    Ankle inversion    Ankle eversion    Great Toe ext (L5)    Grossly     (Blank rows = not tested, score listed is out of 5 possible points.  N = WNL, D = diminished, C = clear for gross weakness with myotome testing, * = concordant pain with testing)  LE ROM:  ROM Right (Eval) Left (Eval)  Hip flexion    Hip extension    Hip abduction    Hip adduction    Hip internal rotation Limited painful   Hip external rotation N painful   Knee extension    Knee flexion    Ankle dorsiflexion    Ankle plantarflexion    Ankle inversion    Ankle eversion     (Blank rows = not tested, N = WNL, * = concordant pain with testing)  Functional Tests  Eval    30'' STS: 13x  UE used? N                                                          PATIENT SURVEYS:  LEFS: 45/80   TODAY'S TREATMENT: 09/27/23: Supine clam red band bilateral and alternating Banded bridge red band 10 x 2  Hip adduction ball squeeze  PPT x 10- mod cues  PPT with ball squeeze- mod cues STS Red band at thighs 10 x 2  Standing hip abduction 10 x 2 each  Tandem stance 25 sec x 2 with RLE back    09/15/23:  Therapeutic  Exercise: Creating, reviewing, and completing below HEP  PATIENT EDUCATION ([place]/HM):  POC, diagnosis, prognosis, HEP, and outcome measures.  Pt educated via explanation, demonstration, and handout (HEP).  Pt confirms understanding verbally.   HOME EXERCISE PROGRAM: Access Code: [email or web address] URL: [email or web address] Date: 09/15/2023 Prepared by: Lesleigh Rash  Exercises - Supine Hip Adduction Isometric with Ball  - 1 x daily - 7 x weekly - 2 sets - 10 reps - 10'' hold - Hooklying Clamshell with Resistance  - 1 x daily - 7 x weekly - 3 sets - 10 reps - Bridge  - 1 x daily - 7 x weekly - 3 sets - 10 reps - 5-10'' hold  Treatment priorities   Eval        Gentle progressive hip strengthening        Aquatic therapy        Look at balance                          ASSESSMENT:  CLINICAL IMPRESSION: Pt reports compliance with HEP and some lumbar discomfort. She reports continued constant pain and sharp pain sometimes when walking. Reviewed HEP and progressed with lumbo pelvic stabilization. Pt required significant cues for PPT and reports increased lumbar discomfort. Tandem stance held 25 sec without hip pain. No increased pain at end of session. Will plan to update HEP next session depending on tolerance to this session. Pt reported no change in hip pain at end of session.    EVAL: [name] Rowe is a 63 y.o. female who presents to clinic with signs and sxs consistent with R hip pain with known acetabular labral tear.  This is chronic and has been going on for years.  MD recommends THA but  pt is too young.  Given high levels of pain and hx of fibromyalgia we will try a combination of land and aquatic exercise.  [name] Rowe will benefit from skilled PT to address relevant deficits and improve comfort in daily tasks including cleaning.   OBJECTIVE IMPAIRMENTS: Pain, R hip ROM, bil hip and LE strength, gait  ACTIVITY LIMITATIONS: bending, squatting, tranfers, housework,  lifting  PERSONAL FACTORS: See medical history and pertinent history   REHAB POTENTIAL: Fair chronic with severe pain; see medical history  CLINICAL DECISION MAKING: Evolving/moderate complexity  EVALUATION COMPLEXITY: Moderate   GOALS:   SHORT TERM GOALS: Target date: 10/13/2023   [place] will be >75% HEP compliant to improve carryover between sessions and facilitate independent management of condition  Evaluation: ongoing Goal status: INITIAL   LONG TERM GOALS: Target date: 10/13/2023   [name] Rowe will self report >/= 50% decrease in pain from evaluation to improve function in daily tasks  Evaluation/Baseline: 10/10 max pain Goal status: INITIAL   2.  [name] Rowe will improve the following MMTs to >/= 4/5 to show improvement in strength:     Evaluation/Baseline:   LE MMT:  MMT Right (Eval) Left (Eval)  Hip flexion (L2, L3) 3* 4  Knee extension (L3) 3+* 4  Knee flexion 3+ 4  Hip abduction 3-* 3+  Hip extension    Hip external rotation 3*   Hip internal rotation 3*   Hip adduction    Ankle dorsiflexion (L4)    Ankle plantarflexion (S1)    Ankle inversion    Ankle eversion    Great Toe ext (L5)    Grossly     (Blank rows = not tested, score listed is out of 5 possible points.  N = WNL, D = diminished, C = clear for gross weakness with myotome testing, * = concordant pain with testing)  Goal status: INITIAL   3.  [name] Rowe will be able to clean her house, not limited by pain  Evaluation/Baseline: limited Goal status: INITIAL   4.  [name] Rowe will show a >/= 18 pt improvement in LEFS score (MCID is ~11% or 9 pts) as a proxy for functional improvement   Evaluation/Baseline: 45 pts Goal status: INITIAL   5.  [name] Rowe will be able to walk outside for exercise and take pictures, not limited by pain  Evaluation/Baseline: limited Goal status: INITIAL   PLAN: PT FREQUENCY: 1-2x/week  PT DURATION: 8 weeks  PLANNED INTERVENTIONS:  97164- PT Re-evaluation,  97110-Therapeutic exercises, 97530- Therapeutic activity, [id number]- Neuromuscular re-education, 97535- Self Care, 29562- Manual therapy, [id number]- Gait training, [id number]- Aquatic Therapy, [phone]- Electrical stimulation (manual), [id number]- Vasopneumatic device, [id number]- Traction (mechanical), [id number]- Ionotophoresis 4mg /ml Dexamethasone, Taping, Dry Needling, Joint manipulation, and Spinal manipulation.  Gasper Karst, PTA 09/27/23 12:24 PM Phone: [phone] Fax: [phone]

## 2023-09-28 NOTE — Therapy (Signed)
 OUTPATIENT PHYSICAL THERAPY LOWER EXTREMITY TREATMENT  Patient Name: [name] Rowe MRN: [id number] DOB:Jul 08, 1960, 62 y.o., female Today's Date: 09/29/2023   PT End of Session - 09/29/23 1145     Visit Number 3    Number of Visits 17    Date for PT Re-Evaluation 11/10/23    Authorization Type [employer] - LEFS    PT Start Time 1101    PT Stop Time 1145    PT Time Calculation (min) 44 min    Activity Tolerance Patient tolerated treatment well;Patient limited by pain    Behavior During Therapy [facility] for tasks assessed/performed              Past Medical History:  Diagnosis Date   Adenomyosis    Anginal pain (HCC)    Anxiety 2001   Chest pain    a. No angiographic CAD by cath 2009 in [place]. There was catheter-induced RCA vasospasm versus microvascular obstruction. b. Lexiscan  myoview  04/2010 - EF 79%, normal perfusion.;  c. ETT-Myoview  7/13: no ischemia, EF 77%, submax exercise   Chronic back pain    had 3 epidural injections on 02-14-13   Chronic kidney disease    Complication of anesthesia    has difficulty voiding after surgery   Coronary vasospasm (HCC)    CVA (cerebral vascular accident) (HCC) 2006   lost hearing on left   Depression 2001   Dysrhythmia    Fibromuscular dysplasia (HCC) 2009   a. Renal artery stenosis due to this. b. Carotid dopplers 07/2011 c/w FMD but no significant stenosis   Fibromyalgia 1999   H/O Graves' disease 2003   s/p radio-iodine treatment in 2003    Hearing loss of left ear    s/p CVA 2006   History of stent insertion of renal artery 2009   right renal stent   HLD (hyperlipidemia) 2000   HTN (hypertension) 1988   a. Likely related to RAS; urinary metanephrines and plasma renin/aldosterone ratio normal. b. H/o HTN urgency 06/2011 after being off BP meds for a number of days   Hx of echocardiogram    a. Echo (1/13): EF 55-60%, grade 1 diastolic dysfunction   Hypertensive crisis 06/29/2011   Hypothyroidism 2003   Graves disease  s/p radio-iodine treatment in 2003    Irritable bowel syndrome    Leiomyoma    Migraine headache    Myocardial infarction [facility]) 2009   Personal history of colonic polyps-sessile serrated adenoma 02/23/2013   Renal artery stenosis (HCC)    a. Due to fibromuscular dysplasia. b. Renal stent 2009. c. Studies:  CTA abdomen (1/13) with evidence for fibromuscular dysplasia, right renal artery stent appears patent. Renal artery dopplers (2/13) with FMD but no evidence for signficant stenosis.    Tachycardia 2000   10/27/10-Holter -NSR with occ sinus tachy-rare PACs, no sig arrhythmia   Past Surgical History:  Procedure Laterality Date   BACK SURGERY  2010 and 2011   In CT.  11/19/2008 (possibly a laminectomy) and in May of 2011 she underwent a L2-L3 fusion.   cardiac cath     CARDIAC CATHETERIZATION  2009   states it was normal   CARDIAC CATHETERIZATION N/A 02/03/2015   Procedure: Left Heart Cath and Coronary Angiography;  Surgeon: Darlis Eisenmenger, MD;  Location: [facility] INVASIVE CV LAB;  Service: Cardiovascular;  Laterality: N/A;   COLONOSCOPY  last 02/16/2013   COLONOSCOPY     HIP SURGERY Bilateral 1988 and 1989   "scraped head of femur"   KNEE  ARTHROSCOPY Left    LUMBAR LAMINECTOMY/DECOMPRESSION MICRODISCECTOMY Left 05/25/2013   Procedure: LUMBAR LAMINECTOMY/DECOMPRESSION MICRODISCECTOMY 1 LEVEL;  Surgeon: Pasty Bongo, MD;  Location: [facility] NEURO ORS;  Service: Neurosurgery;  Laterality: Left;  LEFT L5S1 microdiskectomy   POLYPECTOMY     RENAL ARTERY STENT  2009   ROBOTIC ASSISTED LAP VAGINAL HYSTERECTOMY  05/13/2008   [facility] da Vinci hysterectomy  for myomatous uterus    SPINE SURGERY     VAGINAL HYSTERECTOMY     Patient Active Problem List   Diagnosis Date Noted   CAD (coronary artery disease) 06/19/2018   At risk for sleep apnea 05/04/2017   Tachycardia 04/05/2017   Poor sleep pattern 10/20/2016   Fatigue 04/07/2016   Neck pain on left side 03/16/2016   Acute left-sided thoracic back pain  03/16/2016   Pain of left breast 10/30/2015   Lichen sclerosus 08/15/2014   Extremity edema 06/24/2014   Diplopia    History of coronary vasospasm 04/12/2014   Bursitis, trochanteric 11/22/2013   Essential hypertension 11/19/2013   Acute low back pain 08/10/2013   History of colonic polyps 02/23/2013   HNP (herniated nucleus pulposus), lumbar 12/01/2012   Lumbar radicular syndrome 12/01/2012   Seasonal allergies 11/30/2012   Intervertebral disc protrusion 11/06/2012   Situational anxiety 09/05/2012   Vitamin D  insufficiency 06/30/2012   Unilateral hearing loss 06/30/2012   Vestibular dizziness 06/30/2012   Routine adult health maintenance 03/31/2012   Fibromuscular dysplasia (HCC) 08/13/2011   Dizziness and palpitations, episodic 07/06/2011   Poorly-controlled hypertension    History of CVA (cerebrovascular accident)    Hypothyroidism 04/10/2010   Dyslipidemia 04/10/2010   Anxiety 04/10/2010   Depression 04/10/2010   Migraine headache 04/10/2010   Renal artery stenosis (HCC) 04/10/2010   Fibromyalgia 04/10/2010   Status post lumbar surgery 04/10/2010   Backache 04/09/2010    PCP: Arch Beans, PA-C  REFERRING PROVIDER: Wes Hamman, MD  THERAPY DIAG:  Pain in right hip  Muscle weakness  Unsteadiness on feet  REFERRING DIAG: Pain in right hip [M25.551]   Rationale for Evaluation and Treatment:  Rehabilitation  SUBJECTIVE:  PERTINENT PAST HISTORY:  Anxiety, depression, fibromyalgia, CVA (2007, 2022), CAD, lumbar fusion, MI 2009, OCD      PRECAUTIONS: None  WEIGHT BEARING RESTRICTIONS No  FALLS:  Has patient fallen in last 6 months? No, Number of falls: 0  MOI/History of condition:  Onset date: November 2023  SUBJECTIVE STATEMENT Yesterday my hip was so painful. Today my pain is 9/10 pain   EVAL -[name] Rowe is a 63 y.o. female who presents to clinic with chief complaint of R hip pain.  This started in suddenly with no acute pain or  injury.  Recent MRI shows torn anterior superior labrum.     Red flags:  denies   Pain:  Are you having pain? Yes Pain location: R hip pain NPRS scale:  8/10 to 10/10 Aggravating factors: sitting, standing, walking Relieving factors: hip flexion Pain description: sharp, stabbing, and aching Stage: Chronic   Occupation: NA  Assistive Device: na  Hand Dominance: na  Patient Goals/Specific Activities: reduce pain, avoid surgery if possible, clean   OBJECTIVE:   DIAGNOSTIC FINDINGS:  Recent MRI shows torn anterior superior labrum  GENERAL OBSERVATION/GAIT: Reduced time in stance R hip, antalgic gait  SENSATION: Light touch: Appears intact  LE MMT:  MMT Right (Eval) Left (Eval)  Hip flexion (L2, L3) 3* 4  Knee extension (L3) 3+* 4  Knee flexion 3+ 4  Hip abduction 3-* 3+  Hip extension    Hip external rotation 3*   Hip internal rotation 3*   Hip adduction    Ankle dorsiflexion (L4)    Ankle plantarflexion (S1)    Ankle inversion    Ankle eversion    Great Toe ext (L5)    Grossly     (Blank rows = not tested, score listed is out of 5 possible points.  N = WNL, D = diminished, C = clear for gross weakness with myotome testing, * = concordant pain with testing)  LE ROM:  ROM Right (Eval) Left (Eval)  Hip flexion    Hip extension    Hip abduction    Hip adduction    Hip internal rotation Limited painful   Hip external rotation N painful   Knee extension    Knee flexion    Ankle dorsiflexion    Ankle plantarflexion    Ankle inversion    Ankle eversion     (Blank rows = not tested, N = WNL, * = concordant pain with testing)  Functional Tests  Eval    30'' STS: 13x  UE used? N                                                          PATIENT SURVEYS:  LEFS: 45/80   TODAY'S TREATMENT: [facility] Adult PT Treatment:                                                DATE: 09-29-23 Therapeutic Exercise: PPT x 10- mod cues VC for correct  execution Hip adduction ball squeeze  Supine clam GTB bilateral  Supine alternating march with GTB Banded bridge GTB 10 x 2  Manual Therapy: STW Right hip piriformis and gluteal LAD Right hip with reduced pain afterwards  Trigger Point Dry Needling  Initial Treatment: Pt instructed on Dry Needling rational, procedures, and possible side effects. Pt instructed to expect mild to moderate muscle soreness later in the day and/or into the next day.  Pt instructed in methods to reduce muscle soreness. Pt instructed to continue prescribed HEP. Because Dry Needling was performed over or adjacent to a lung field, pt was educated on S/S of pneumothorax and to seek immediate medical attention should they occur.  Patient was educated on signs and symptoms of infection and other risk factors and advised to seek medical attention should they occur.  Patient verbalized understanding of these instructions and education.   Patient Verbal Consent Given: Yes Education Handout Provided: Yes Muscles Treated: Piriformis . Gluteals all on Right Electrical Stimulation Performed: No Treatment Response/Outcome: twitch response, decrease in pain/muscle tension   Therapeutic Activity: STS Red band at thighs 10 x 2  Standing hip abduction 10 x 2 each  09/27/23: Supine clam red band bilateral and alternating Banded bridge red band 10 x 2  Hip adduction ball squeeze  PPT x 10- mod cues  PPT with ball squeeze- mod cues STS Red band at thighs 10 x 2  Standing hip abduction 10 x 2 each  Tandem stance 25 sec x 2 with RLE back    09/15/23:  Therapeutic Exercise: Creating, reviewing,  and completing below HEP  PATIENT EDUCATION (/HM):  POC, diagnosis, prognosis, HEP, and outcome measures.  Pt educated via explanation, demonstration, and handout (HEP).  Pt confirms understanding verbally.   HOME EXERCISE PROGRAM: Access Code: [email or web address] URL: [email or web address] Date: 09/15/2023 Prepared by:  Lesleigh Rash  Exercises - Supine Hip Adduction Isometric with Ball  - 1 x daily - 7 x weekly - 2 sets - 10 reps - 10'' hold - Hooklying Clamshell with Resistance  - 1 x daily - 7 x weekly - 3 sets - 10 reps - Bridge  - 1 x daily - 7 x weekly - 3 sets - 10 reps - 5-10'' hold  Treatment priorities   Eval        Gentle progressive hip strengthening        Aquatic therapy        Look at balance                          ASSESSMENT:  CLINICAL IMPRESSION: Pt reports she is going to neuro MD to check her CVA in 2021 today.  Pt with 9/10 pain in Right hip. She was having difficulty walking into clinic and consented to TPDN and was closely monitored throughout session.  Pt had immediate decrease in pain and muscle tension.  Pt was then able to continue exercises with green theraband resistance. Pt continues to need significant cues for PPT and but did report less lumbar and hip discomfort today. Will plan to update HEP next session depending on tolerance to this session. Pt reported 5/10 pain at end of session   EVAL: Jamya is a 63 y.o. female who presents to clinic with signs and sxs consistent with R hip pain with known acetabular labral tear.  This is chronic and has been going on for years.  MD recommends THA but pt is too young.  Given high levels of pain and hx of fibromyalgia we will try a combination of land and aquatic exercise.  Yanel will benefit from skilled PT to address relevant deficits and improve comfort in daily tasks including cleaning.   OBJECTIVE IMPAIRMENTS: Pain, R hip ROM, bil hip and LE strength, gait  ACTIVITY LIMITATIONS: bending, squatting, tranfers, housework, lifting  PERSONAL FACTORS: See medical history and pertinent history   REHAB POTENTIAL: Fair chronic with severe pain; see medical history  CLINICAL DECISION MAKING: Evolving/moderate complexity  EVALUATION COMPLEXITY: Moderate   GOALS:   SHORT TERM GOALS: Target date:  10/13/2023   Grainne will be >75% HEP compliant to improve carryover between sessions and facilitate independent management of condition  Evaluation: ongoing Goal status: INITIAL   LONG TERM GOALS: Target date: 10/13/2023   Jaydy will self report >/= 50% decrease in pain from evaluation to improve function in daily tasks  Evaluation/Baseline: 10/10 max pain Goal status: INITIAL   2.  Tonika will improve the following MMTs to >/= 4/5 to show improvement in strength:     Evaluation/Baseline:   LE MMT:  MMT Right (Eval) Left (Eval)  Hip flexion (L2, L3) 3* 4  Knee extension (L3) 3+* 4  Knee flexion 3+ 4  Hip abduction 3-* 3+  Hip extension    Hip external rotation 3*   Hip internal rotation 3*   Hip adduction    Ankle dorsiflexion (L4)    Ankle plantarflexion (S1)    Ankle inversion    Ankle eversion    Great Toe ext (L5)    Grossly     (  Blank rows = not tested, score listed is out of 5 possible points.  N = WNL, D = diminished, C = clear for gross weakness with myotome testing, * = concordant pain with testing)  Goal status: INITIAL   3.  Waneta will be able to clean her house, not limited by pain  Evaluation/Baseline: limited Goal status: INITIAL   4.  Yalda will show a >/= 18 pt improvement in LEFS score (MCID is ~11% or 9 pts) as a proxy for functional improvement   Evaluation/Baseline: 45 pts Goal status: INITIAL   5.  Loren will be able to walk outside for exercise and take pictures, not limited by pain  Evaluation/Baseline: limited Goal status: INITIAL   PLAN: PT FREQUENCY: 1-2x/week  PT DURATION: 8 weeks  PLANNED INTERVENTIONS:  97164- PT Re-evaluation, 97110-Therapeutic exercises, 97530- Therapeutic activity, [id number]- Neuromuscular re-education, 97535- Self Care, 16109- Manual therapy, [id number]- Gait training, [id number]- Aquatic Therapy, [phone]- Electrical stimulation (manual), [id number]- Vasopneumatic device, [id number]- Traction (mechanical),  [id number]- Ionotophoresis 4mg /ml Dexamethasone, Taping, Dry Needling, Joint manipulation, and Spinal manipulation.  Sharlet Dawson, PT, [facility] Certified Exercise Expert for the Aging Adult  09/29/23 12:04 PM Phone: [phone] Fax: [phone]

## 2023-09-29 ENCOUNTER — Ambulatory Visit: Admitting: Physical Therapy

## 2023-09-29 DIAGNOSIS — M25551 Pain in right hip: Secondary | ICD-10-CM | POA: Diagnosis not present

## 2023-09-29 DIAGNOSIS — M6281 Muscle weakness (generalized): Secondary | ICD-10-CM

## 2023-09-29 DIAGNOSIS — R2681 Unsteadiness on feet: Secondary | ICD-10-CM

## 2023-09-29 NOTE — Patient Instructions (Signed)
 Trigger Point Dry Needling  What is Trigger Point Dry Needling (DN)? DN is a physical therapy technique used to treat muscle pain and dysfunction. Specifically, DN helps deactivate muscle trigger points (muscle knots).  A thin filiform needle is used to penetrate the skin and stimulate the underlying trigger point. The goal is for a local twitch response (LTR) to occur and for the trigger point to relax. No medication of any kind is injected during the procedure.   What Does Trigger Point Dry Needling Feel Like?  The procedure feels different for each individual patient. Some patients report that they do not actually feel the needle enter the skin and overall the process is not painful. Very mild bleeding may occur. However, many patients feel a deep cramping in the muscle in which the needle was inserted. This is the local twitch response.   How Will I feel after the treatment? Soreness is normal, and the onset of soreness may not occur for a few hours. Typically this soreness does not last longer than two days.  Bruising is uncommon, however; ice can be used to decrease any possible bruising.  In rare cases feeling tired or nauseous after the treatment is normal. In addition, your symptoms may get worse before they get better, this period will typically not last longer than 24 hours.   What Can I do After My Treatment? Increase your hydration by drinking more water for the next 24 hours.  You may place ice or heat on the areas treated that have become sore, however, do not use heat on inflamed or bruised areas. Heat often brings more relief post needling. You can continue your regular activities, but vigorous activity is not recommended initially after the treatment for 24 hours. DN is best combined with other physical therapy such as strengthening, stretching, and other therapies.   What are the complications? While your therapist has had extensive training in minimizing the risks of trigger  point dry needling, it is important to understand the risks of any procedure.  Risks include bleeding, pain, fatigue, hematoma, infection, vertigo, nausea or nerve involvement. Monitor for any changes to your skin or sensation. Contact your therapist or MD with concerns.  A rare but serious complication is a pneumothorax over or near your middle and upper chest and back If you have dry needling in this area, monitor for the following symptoms: Shortness of breath on exertion and/or Difficulty taking a deep breath and/or Chest Pain and/or A dry cough If any of the above symptoms develop, please go to the nearest emergency room or call 911. Tell them you had dry needling over your thorax and report any symptoms you are having. Please follow-up with your treating therapist after you complete the medical evaluation.  Sharlet Dawson, PT, [facility] Certified Exercise Expert for the Aging Adult  09/29/23 11:11 AM Phone: [phone] Fax: [phone]

## 2023-10-03 ENCOUNTER — Ambulatory Visit: Admitting: Physical Therapy

## 2023-10-03 ENCOUNTER — Encounter: Payer: Self-pay | Admitting: Physical Therapy

## 2023-10-03 DIAGNOSIS — M6281 Muscle weakness (generalized): Secondary | ICD-10-CM

## 2023-10-03 DIAGNOSIS — M25551 Pain in right hip: Secondary | ICD-10-CM | POA: Diagnosis not present

## 2023-10-03 DIAGNOSIS — R2681 Unsteadiness on feet: Secondary | ICD-10-CM

## 2023-10-03 NOTE — Patient Instructions (Signed)
 IONTOPHORESIS PATIENT PRECAUTIONS & CONTRAINDICATIONS:  Redness under one or both electrodes can occur.  This characterized by a uniform redness that usually disappears within 12 hours of treatment. Small pinhead size blisters may result in response to the drug.  Contact your physician if the problem persists more than 24 hours. On rare occasions, iontophoresis therapy can result in temporary skin reactions such as rash, inflammation, irritation or burns.  The skin reactions may be the result of individual sensitivity to the ionic solution used, the condition of the skin at the start of treatment, reaction to the materials in the electrodes, allergies or sensitivity to dexamethasone, or a poor connection between the patch and your skin.  Discontinue using iontophoresis if you have any of these reactions and report to your therapist. Remove the Patch or electrodes if you have any undue sensation of pain or burning during the treatment and report discomfort to your therapist. Tell your Therapist if you have had known adverse reactions to the application of electrical current. If using the Patch, the LED light will turn off when treatment is complete and the patch can be removed.  Approximate treatment time is 1-3 hours.  Remove the patch when light goes off or after 6 hours. The Patch can be worn during normal activity, however excessive motion where the electrodes have been placed can cause poor contact between the skin and the electrode or uneven electrical current resulting in greater risk of skin irritation. Keep out of the reach of children.   DO NOT use if you have a cardiac pacemaker or any other electrically sensitive implanted device. DO NOT use if you have a known sensitivity to dexamethasone. DO NOT use during Magnetic Resonance Imaging (MRI). DO NOT use over broken or compromised skin (e.g. sunburn, cuts, or acne) due to the increased risk of skin reaction. DO NOT SHAVE over the area to  be treated:  To establish good contact between the Patch and the skin, excessive hair may be clipped. DO NOT place the Patch or electrodes on or over your eyes, directly over your heart, or brain. DO NOT reuse the Patch or electrodes as this may cause burns to occur.    TENS UNIT  This is helpful for muscle pain and spasm.   Search and Purchase a TENS 7000 2nd edition at www.tenspros.com or www.amazon.com  (It should be less than $30)     TENS unit instructions:  Do not shower or bathe with the unit on Turn the unit off before removing electrodes or batteries If the electrodes lose stickiness add a drop of water to the electrodes after they are disconnected from the unit and place on plastic sheet. If you continued to have difficulty, call the TENS unit company to purchase more electrodes. Do not apply lotion on the skin area prior to use. Make sure the skin is clean and dry as this will help prolong the life of the electrodes. After use, always check skin for unusual red areas, rash or other skin difficulties. If there are any skin problems, does not apply electrodes to the same area. Never remove the electrodes from the unit by pulling the wires. Do not use the TENS unit or electrodes other than as directed. Do not change electrode placement without consulting your therapist or physician. Keep 2 fingers with between each electrode.

## 2023-10-03 NOTE — Therapy (Signed)
 OUTPATIENT PHYSICAL THERAPY LOWER EXTREMITY TREATMENT  Patient Name: [name] Rowe MRN: [id number] DOB:1961-04-16, 63 y.o., female Today's Date: 10/03/2023   PT End of Session - 10/03/23 1017     Visit Number 4    Number of Visits 17    Date for PT Re-Evaluation 11/10/23    Authorization Type [employer] - LEFS    PT Start Time 1015    PT Stop Time 1102    PT Time Calculation (min) 47 min              Past Medical History:  Diagnosis Date   Adenomyosis    Anginal pain (HCC)    Anxiety 2001   Chest pain    a. No angiographic CAD by cath 2009 in [place]. There was catheter-induced RCA vasospasm versus microvascular obstruction. b. Lexiscan  myoview  04/2010 - EF 79%, normal perfusion.;  c. ETT-Myoview  7/13: no ischemia, EF 77%, submax exercise   Chronic back pain    had 3 epidural injections on 02-14-13   Chronic kidney disease    Complication of anesthesia    has difficulty voiding after surgery   Coronary vasospasm (HCC)    CVA (cerebral vascular accident) (HCC) 2006   lost hearing on left   Depression 2001   Dysrhythmia    Fibromuscular dysplasia (HCC) 2009   a. Renal artery stenosis due to this. b. Carotid dopplers 07/2011 c/w FMD but no significant stenosis   Fibromyalgia 1999   H/O Graves' disease 2003   s/p radio-iodine treatment in 2003    Hearing loss of left ear    s/p CVA 2006   History of stent insertion of renal artery 2009   right renal stent   HLD (hyperlipidemia) 2000   HTN (hypertension) 1988   a. Likely related to RAS; urinary metanephrines and plasma renin/aldosterone ratio normal. b. H/o HTN urgency 06/2011 after being off BP meds for a number of days   Hx of echocardiogram    a. Echo (1/13): EF 55-60%, grade 1 diastolic dysfunction   Hypertensive crisis 06/29/2011   Hypothyroidism 2003   Graves disease s/p radio-iodine treatment in 2003    Irritable bowel syndrome    Leiomyoma    Migraine headache    Myocardial infarction [facility]) 2009    Personal history of colonic polyps-sessile serrated adenoma 02/23/2013   Renal artery stenosis (HCC)    a. Due to fibromuscular dysplasia. b. Renal stent 2009. c. Studies:  CTA abdomen (1/13) with evidence for fibromuscular dysplasia, right renal artery stent appears patent. Renal artery dopplers (2/13) with FMD but no evidence for signficant stenosis.    Tachycardia 2000   10/27/10-Holter -NSR with occ sinus tachy-rare PACs, no sig arrhythmia   Past Surgical History:  Procedure Laterality Date   BACK SURGERY  2010 and 2011   In CT.  11/19/2008 (possibly a laminectomy) and in May of 2011 she underwent a L2-L3 fusion.   cardiac cath     CARDIAC CATHETERIZATION  2009   states it was normal   CARDIAC CATHETERIZATION N/A 02/03/2015   Procedure: Left Heart Cath and Coronary Angiography;  Surgeon: Darlis Eisenmenger, MD;  Location: [facility] INVASIVE CV LAB;  Service: Cardiovascular;  Laterality: N/A;   COLONOSCOPY  last 02/16/2013   COLONOSCOPY     HIP SURGERY Bilateral 1988 and 1989   "scraped head of femur"   KNEE ARTHROSCOPY Left    LUMBAR LAMINECTOMY/DECOMPRESSION MICRODISCECTOMY Left 05/25/2013   Procedure: LUMBAR LAMINECTOMY/DECOMPRESSION MICRODISCECTOMY 1 LEVEL;  Surgeon: Garnell Jurist  Michale Age, MD;  Location: [facility] NEURO ORS;  Service: Neurosurgery;  Laterality: Left;  LEFT L5S1 microdiskectomy   POLYPECTOMY     RENAL ARTERY STENT  2009   ROBOTIC ASSISTED LAP VAGINAL HYSTERECTOMY  05/13/2008   [facility] da Vinci hysterectomy  for myomatous uterus    SPINE SURGERY     VAGINAL HYSTERECTOMY     Patient Active Problem List   Diagnosis Date Noted   CAD (coronary artery disease) 06/19/2018   At risk for sleep apnea 05/04/2017   Tachycardia 04/05/2017   Poor sleep pattern 10/20/2016   Fatigue 04/07/2016   Neck pain on left side 03/16/2016   Acute left-sided thoracic back pain 03/16/2016   Pain of left breast 10/30/2015   Lichen sclerosus 08/15/2014   Extremity edema 06/24/2014   Diplopia    History of  coronary vasospasm 04/12/2014   Bursitis, trochanteric 11/22/2013   Essential hypertension 11/19/2013   Acute low back pain 08/10/2013   History of colonic polyps 02/23/2013   HNP (herniated nucleus pulposus), lumbar 12/01/2012   Lumbar radicular syndrome 12/01/2012   Seasonal allergies 11/30/2012   Intervertebral disc protrusion 11/06/2012   Situational anxiety 09/05/2012   Vitamin D  insufficiency 06/30/2012   Unilateral hearing loss 06/30/2012   Vestibular dizziness 06/30/2012   Routine adult health maintenance 03/31/2012   Fibromuscular dysplasia (HCC) 08/13/2011   Dizziness and palpitations, episodic 07/06/2011   Poorly-controlled hypertension    History of CVA (cerebrovascular accident)    Hypothyroidism 04/10/2010   Dyslipidemia 04/10/2010   Anxiety 04/10/2010   Depression 04/10/2010   Migraine headache 04/10/2010   Renal artery stenosis (HCC) 04/10/2010   Fibromyalgia 04/10/2010   Status post lumbar surgery 04/10/2010   Backache 04/09/2010    PCP: Arch Beans, PA-C  REFERRING PROVIDER: Wes Hamman, MD  THERAPY DIAG:  Pain in right hip  Muscle weakness  Unsteadiness on feet  REFERRING DIAG: Pain in right hip [M25.551]   Rationale for Evaluation and Treatment:  Rehabilitation  SUBJECTIVE:  PERTINENT PAST HISTORY:  Anxiety, depression, fibromyalgia, CVA (2007, 2022), CAD, lumbar fusion, MI 2009, OCD      PRECAUTIONS: None  WEIGHT BEARING RESTRICTIONS No  FALLS:  Has patient fallen in last 6 months? No, Number of falls: 0  MOI/History of condition:  Onset date: November 2023  SUBJECTIVE STATEMENT I did some light cooking and now my pain is 10/10. I can barely stand with weight on that leg, it hurts so bad.    EVAL -[name] Rowe is a 63 y.o. female who presents to clinic with chief complaint of R hip pain.  This started in suddenly with no acute pain or injury.  Recent MRI shows torn anterior superior labrum.     Red flags:   denies   Pain:  Are you having pain? Yes Pain location: R hip pain NPRS scale:  8/10 to 10/10 Aggravating factors: sitting, standing, walking Relieving factors: hip flexion Pain description: sharp, stabbing, and aching Stage: Chronic   Occupation: NA  Assistive Device: na  Hand Dominance: na  Patient Goals/Specific Activities: reduce pain, avoid surgery if possible, clean   OBJECTIVE:   DIAGNOSTIC FINDINGS:  Recent MRI shows torn anterior superior labrum  GENERAL OBSERVATION/GAIT: Reduced time in stance R hip, antalgic gait  SENSATION: Light touch: Appears intact  LE MMT:  MMT Right (Eval) Left (Eval)  Hip flexion (L2, L3) 3* 4  Knee extension (L3) 3+* 4  Knee flexion 3+ 4  Hip abduction 3-* 3+  Hip extension  Hip external rotation 3*   Hip internal rotation 3*   Hip adduction    Ankle dorsiflexion (L4)    Ankle plantarflexion (S1)    Ankle inversion    Ankle eversion    Great Toe ext (L5)    Grossly     (Blank rows = not tested, score listed is out of 5 possible points.  N = WNL, D = diminished, C = clear for gross weakness with myotome testing, * = concordant pain with testing)  LE ROM:  ROM Right (Eval) Left (Eval)  Hip flexion    Hip extension    Hip abduction    Hip adduction    Hip internal rotation Limited painful   Hip external rotation N painful   Knee extension    Knee flexion    Ankle dorsiflexion    Ankle plantarflexion    Ankle inversion    Ankle eversion     (Blank rows = not tested, N = WNL, * = concordant pain with testing)  Functional Tests  Eval    30'' STS: 13x  UE used? N                                                          PATIENT SURVEYS:  LEFS: 45/80   TODAY'S TREATMENT: [facility] Adult PT Treatment:                                                DATE: 10/03/23 Therapeutic Exercise: PPT -mod cues x 10 PPT with bent knee fall outs  x 10 PPT with march x 10 PPT with bridge  x  10 Modalities: E stim : IFC to tolerance to right lateral hip Ionto patch to right lateral hip with 1 ML dexamethasone- wear time 4 hours       [facility] Adult PT Treatment:                                                DATE: 09-29-23 Therapeutic Exercise: PPT x 10- mod cues VC for correct execution Hip adduction ball squeeze  Supine clam GTB bilateral  Supine alternating march with GTB Banded bridge GTB 10 x 2  Manual Therapy: STW Right hip piriformis and gluteal LAD Right hip with reduced pain afterwards  Trigger Point Dry Needling  Initial Treatment: Pt instructed on Dry Needling rational, procedures, and possible side effects. Pt instructed to expect mild to moderate muscle soreness later in the day and/or into the next day.  Pt instructed in methods to reduce muscle soreness. Pt instructed to continue prescribed HEP. Because Dry Needling was performed over or adjacent to a lung field, pt was educated on S/S of pneumothorax and to seek immediate medical attention should they occur.  Patient was educated on signs and symptoms of infection and other risk factors and advised to seek medical attention should they occur.  Patient verbalized understanding of these instructions and education.   Patient Verbal Consent Given: Yes Education Handout Provided: Yes Muscles Treated: Piriformis . Gluteals all on Right  Electrical Stimulation Performed: No Treatment Response/Outcome: twitch response, decrease in pain/muscle tension   Therapeutic Activity: STS Red band at thighs 10 x 2  Standing hip abduction 10 x 2 each  09/27/23: Supine clam red band bilateral and alternating Banded bridge red band 10 x 2  Hip adduction ball squeeze  PPT x 10- mod cues  PPT with ball squeeze- mod cues STS Red band at thighs 10 x 2  Standing hip abduction 10 x 2 each  Tandem stance 25 sec x 2 with RLE back    09/15/23:  Therapeutic Exercise: Creating, reviewing, and completing below HEP  PATIENT  EDUCATION ([place]/[facility]):  TENS, IONTO precautons and contraindications Pt educated via explanation, demonstration, and handout (HEP).  Pt confirms understanding verbally.   HOME EXERCISE PROGRAM: Access Code: [email or web address] URL: [email or web address] Date: 09/15/2023 Prepared by: Lesleigh Rash  Exercises - Supine Hip Adduction Isometric with Ball  - 1 x daily - 7 x weekly - 2 sets - 10 reps - 10'' hold - Hooklying Clamshell with Resistance  - 1 x daily - 7 x weekly - 3 sets - 10 reps - Bridge  - 1 x daily - 7 x weekly - 3 sets - 10 reps - 5-10'' hold  Treatment priorities   Eval        Gentle progressive hip strengthening        Aquatic therapy        Look at balance                          ASSESSMENT:  CLINICAL IMPRESSION: Pt arrives with significant pain in right hip and is tearful. She had short term relief with TPDN and pain returned and is rated at 10/10 today. Did some light cooking over the weekend which caused pain increase from the time she was standing. Pt likes to be active  and is limited due to pain. She is forced to sit more than she would like due to pain. Today we continued with gentle therex and tried modalities for pain relief (estim and ionto). She will have aquatic therapy this week as well. Will assess response to these interventions next week.  If pain levels continue at this level, will recommend return to MD for further treatment options. She did have decreased pain after estim today and was provided info on where to purchase for home use.    EVAL: [name] Rowe is a 63 y.o. female who presents to clinic with signs and sxs consistent with R hip pain with known acetabular labral tear.  This is chronic and has been going on for years.  MD recommends THA but pt is too young.  Given high levels of pain and hx of fibromyalgia we will try a combination of land and aquatic exercise.  [name] Rowe will benefit from skilled PT to address relevant deficits and improve  comfort in daily tasks including cleaning.   OBJECTIVE IMPAIRMENTS: Pain, R hip ROM, bil hip and LE strength, gait  ACTIVITY LIMITATIONS: bending, squatting, tranfers, housework, lifting  PERSONAL FACTORS: See medical history and pertinent history   REHAB POTENTIAL: Fair chronic with severe pain; see medical history  CLINICAL DECISION MAKING: Evolving/moderate complexity  EVALUATION COMPLEXITY: Moderate   GOALS:   SHORT TERM GOALS: Target date: 10/13/2023   [name] Rowe will be >75% HEP compliant to improve carryover between sessions and facilitate independent management of condition  Evaluation: ongoing Goal status: INITIAL   LONG TERM GOALS: Target date: 10/13/2023  [name] Rowe will self report >/= 50% decrease in pain from evaluation to improve function in daily tasks  Evaluation/Baseline: 10/10 max pain Goal status: INITIAL   2.  [name] Rowe will improve the following MMTs to >/= 4/5 to show improvement in strength:     Evaluation/Baseline:   LE MMT:  MMT Right (Eval) Left (Eval)  Hip flexion (L2, L3) 3* 4  Knee extension (L3) 3+* 4  Knee flexion 3+ 4  Hip abduction 3-* 3+  Hip extension    Hip external rotation 3*   Hip internal rotation 3*   Hip adduction    Ankle dorsiflexion (L4)    Ankle plantarflexion (S1)    Ankle inversion    Ankle eversion    Great Toe ext (L5)    Grossly     (Blank rows = not tested, score listed is out of 5 possible points.  N = WNL, D = diminished, C = clear for gross weakness with myotome testing, * = concordant pain with testing)  Goal status: INITIAL   3.  [name] Rowe will be able to clean her house, not limited by pain  Evaluation/Baseline: limited Goal status: INITIAL   4.  [name] Rowe will show a >/= 18 pt improvement in LEFS score (MCID is ~11% or 9 pts) as a proxy for functional improvement   Evaluation/Baseline: 45 pts Goal status: INITIAL   5.  [name] Rowe will be able to walk outside for exercise and take pictures,  not limited by pain  Evaluation/Baseline: limited Goal status: INITIAL   PLAN: PT FREQUENCY: 1-2x/week  PT DURATION: 8 weeks  PLANNED INTERVENTIONS:  [id number]- PT Re-evaluation, 97110-Therapeutic exercises, 97530- Therapeutic activity, [id number]- Neuromuscular re-education, 97535- Self Care, 01027- Manual therapy, [id number]- Gait training, [id number]- Aquatic Therapy, [phone]- Electrical stimulation (manual), [id number]- Vasopneumatic device, [id number]- Traction (mechanical), [id number]- Ionotophoresis 4mg /ml Dexamethasone, Taping, Dry Needling, Joint manipulation, and Spinal manipulation.  Gasper Karst, PTA 10/03/23 10:48 AM Phone: [phone] Fax: [phone]

## 2023-10-04 NOTE — Therapy (Signed)
 OUTPATIENT PHYSICAL THERAPY LOWER EXTREMITY TREATMENT  Patient Name: [name] Rowe MRN: [id number] DOB:04/08/1961, 63 y.o., female Today's Date: 10/05/2023   PT End of Session - 10/05/23 1324     Visit Number 5    Number of Visits 17    Date for PT Re-Evaluation 11/10/23    Authorization Type [employer] - LEFS    PT Start Time 1325    PT Stop Time 1412    PT Time Calculation (min) 47 min    Activity Tolerance Patient tolerated treatment well;Patient limited by pain    Behavior During Therapy [name] Rowe for tasks assessed/performed               Past Medical History:  Diagnosis Date   Adenomyosis    Anginal pain (HCC)    Anxiety 2001   Chest pain    a. No angiographic CAD by cath 2009 in [place]. There was catheter-induced RCA vasospasm versus microvascular obstruction. b. Lexiscan  myoview  04/2010 - EF 79%, normal perfusion.;  c. ETT-Myoview  7/13: no ischemia, EF 77%, submax exercise   Chronic back pain    had 3 epidural injections on 02-14-13   Chronic kidney disease    Complication of anesthesia    has difficulty voiding after surgery   Coronary vasospasm (HCC)    CVA (cerebral vascular accident) (HCC) 2006   lost hearing on left   Depression 2001   Dysrhythmia    Fibromuscular dysplasia (HCC) 2009   a. Renal artery stenosis due to this. b. Carotid dopplers 07/2011 c/w FMD but no significant stenosis   Fibromyalgia 1999   H/O Graves' disease 2003   s/p radio-iodine treatment in 2003    Hearing loss of left ear    s/p CVA 2006   History of stent insertion of renal artery 2009   right renal stent   HLD (hyperlipidemia) 2000   HTN (hypertension) 1988   a. Likely related to RAS; urinary metanephrines and plasma renin/aldosterone ratio normal. b. H/o HTN urgency 06/2011 after being off BP meds for a number of days   Hx of echocardiogram    a. Echo (1/13): EF 55-60%, grade 1 diastolic dysfunction   Hypertensive crisis 06/29/2011   Hypothyroidism 2003   Graves disease  s/p radio-iodine treatment in 2003    Irritable bowel syndrome    Leiomyoma    Migraine headache    Myocardial infarction [facility]) 2009   Personal history of colonic polyps-sessile serrated adenoma 02/23/2013   Renal artery stenosis (HCC)    a. Due to fibromuscular dysplasia. b. Renal stent 2009. c. Studies:  CTA abdomen (1/13) with evidence for fibromuscular dysplasia, right renal artery stent appears patent. Renal artery dopplers (2/13) with FMD but no evidence for signficant stenosis.    Tachycardia 2000   10/27/10-Holter -NSR with occ sinus tachy-rare PACs, no sig arrhythmia   Past Surgical History:  Procedure Laterality Date   BACK SURGERY  2010 and 2011   In CT.  11/19/2008 (possibly a laminectomy) and in May of 2011 she underwent a L2-L3 fusion.   cardiac cath     CARDIAC CATHETERIZATION  2009   states it was normal   CARDIAC CATHETERIZATION N/A 02/03/2015   Procedure: Left Heart Cath and Coronary Angiography;  Surgeon: Darlis Eisenmenger, MD;  Location: [facility] INVASIVE CV LAB;  Service: Cardiovascular;  Laterality: N/A;   COLONOSCOPY  last 02/16/2013   COLONOSCOPY     HIP SURGERY Bilateral 1988 and 1989   "scraped head of femur"  KNEE ARTHROSCOPY Left    LUMBAR LAMINECTOMY/DECOMPRESSION MICRODISCECTOMY Left 05/25/2013   Procedure: LUMBAR LAMINECTOMY/DECOMPRESSION MICRODISCECTOMY 1 LEVEL;  Surgeon: Pasty Bongo, MD;  Location: [facility] NEURO ORS;  Service: Neurosurgery;  Laterality: Left;  LEFT L5S1 microdiskectomy   POLYPECTOMY     RENAL ARTERY STENT  2009   ROBOTIC ASSISTED LAP VAGINAL HYSTERECTOMY  05/13/2008   [facility] da Vinci hysterectomy  for myomatous uterus    SPINE SURGERY     VAGINAL HYSTERECTOMY     Patient Active Problem List   Diagnosis Date Noted   CAD (coronary artery disease) 06/19/2018   At risk for sleep apnea 05/04/2017   Tachycardia 04/05/2017   Poor sleep pattern 10/20/2016   Fatigue 04/07/2016   Neck pain on left side 03/16/2016   Acute left-sided thoracic back pain  03/16/2016   Pain of left breast 10/30/2015   Lichen sclerosus 08/15/2014   Extremity edema 06/24/2014   Diplopia    History of coronary vasospasm 04/12/2014   Bursitis, trochanteric 11/22/2013   Essential hypertension 11/19/2013   Acute low back pain 08/10/2013   History of colonic polyps 02/23/2013   HNP (herniated nucleus pulposus), lumbar 12/01/2012   Lumbar radicular syndrome 12/01/2012   Seasonal allergies 11/30/2012   Intervertebral disc protrusion 11/06/2012   Situational anxiety 09/05/2012   Vitamin D  insufficiency 06/30/2012   Unilateral hearing loss 06/30/2012   Vestibular dizziness 06/30/2012   Routine adult health maintenance 03/31/2012   Fibromuscular dysplasia (HCC) 08/13/2011   Dizziness and palpitations, episodic 07/06/2011   Poorly-controlled hypertension    History of CVA (cerebrovascular accident)    Hypothyroidism 04/10/2010   Dyslipidemia 04/10/2010   Anxiety 04/10/2010   Depression 04/10/2010   Migraine headache 04/10/2010   Renal artery stenosis (HCC) 04/10/2010   Fibromyalgia 04/10/2010   Status post lumbar surgery 04/10/2010   Backache 04/09/2010    PCP: Arch Beans, PA-C  REFERRING PROVIDER: Podraza, Cole Christoph*  THERAPY DIAG:  Pain in right hip  Muscle weakness  Unsteadiness on feet  REFERRING DIAG: Pain in right hip [M25.551]   Rationale for Evaluation and Treatment:  Rehabilitation  SUBJECTIVE:  PERTINENT PAST HISTORY:  Anxiety, depression, fibromyalgia, CVA (2007, 2022), CAD, lumbar fusion, MI 2009, OCD      PRECAUTIONS: None  WEIGHT BEARING RESTRICTIONS No  FALLS:  Has patient fallen in last 6 months? No, Number of falls: 0  MOI/History of condition:  Onset date: November 2023  SUBJECTIVE STATEMENT I am feeling a knife, pinching pain in my Right hip.  I am a 9/10 pain. It is so difficult to drive and everything. I tried the patch ( iontophoresis) and it helped for 2 days but I am having bad pain  now.   EVAL -[name] Rowe is a 63 y.o. female who presents to clinic with chief complaint of R hip pain.  This started in suddenly with no acute pain or injury.  Recent MRI shows torn anterior superior labrum.     Red flags:  denies   Pain:  Are you having pain? Yes Pain location: R hip pain NPRS scale:  8/10 to 10/10 Aggravating factors: sitting, standing, walking Relieving factors: hip flexion Pain description: sharp, stabbing, and aching Stage: Chronic   Occupation: NA  Assistive Device: na  Hand Dominance: na  Patient Goals/Specific Activities: reduce pain, avoid surgery if possible, clean   OBJECTIVE:   DIAGNOSTIC FINDINGS:  Recent MRI shows torn anterior superior labrum  GENERAL OBSERVATION/GAIT: Reduced time in stance R hip, antalgic gait  SENSATION: Light touch: Appears intact  LE MMT:  MMT Right (Eval) Left (Eval)  Hip flexion (L2, L3) 3* 4  Knee extension (L3) 3+* 4  Knee flexion 3+ 4  Hip abduction 3-* 3+  Hip extension    Hip external rotation 3*   Hip internal rotation 3*   Hip adduction    Ankle dorsiflexion (L4)    Ankle plantarflexion (S1)    Ankle inversion    Ankle eversion    Great Toe ext (L5)    Grossly     (Blank rows = not tested, score listed is out of 5 possible points.  N = WNL, D = diminished, C = clear for gross weakness with myotome testing, * = concordant pain with testing)  LE ROM:  ROM Right (Eval) Left (Eval)  Hip flexion    Hip extension    Hip abduction    Hip adduction    Hip internal rotation Limited painful   Hip external rotation N painful   Knee extension    Knee flexion    Ankle dorsiflexion    Ankle plantarflexion    Ankle inversion    Ankle eversion     (Blank rows = not tested, N = WNL, * = concordant pain with testing)  Functional Tests  Eval    30'' STS: 13x  UE used? N                                                          PATIENT SURVEYS:  LEFS:  45/80   TODAY'S TREATMENT: [facility] Adult PT Treatment:                                                DATE: 10-05-23 Aquatic therapy at [facility]- [address] - therapeutic pool temp approximately 92 degrees.Pt enters building independently. Treatment took place in water 3.8 to  4 ft 8 in. deep depending upon activity.  Pt entered and exited the pool via stair and handrails independently. Pt pain level 9/10 at initiation and 3/10 at end of session     [name] Rowe was educated on beneficial therapeutic effects of water while ambulating to acclimate to water walking forward, backward and side stepping.  Pt educated on neutral posture and hip hinging in seated position with water at chest level x 10 with stretch to low back and then x 10 with back at pool wall at external cue, VC for neck tucked to prevent hyperextension. Aqua Stretch for Right hip/gluteals with moderate relief  Aquatic Exercise: On submerged bench and/or on edge of pool holding with one UE   Walking side forward and side stepping across pool x 4 Heel/ toe raises BIL   Hip ext/flex with knee straight  Hip abduction/adduction x10 BIL Hamstring curl Hip circles CW/CCW  pt fatiguing Stomp with yellow noodle  can only do left side Squats x 20 Quad stretch 3-way stretch with pool noodle, IT band, groin and hamstring Runners stretch bottom step x30" BIL Hamstring stretch bottom step x30" BIL Back L stretch   Pt requires the buoyancy of water for active assisted exercises with buoyancy supported for strengthening and AROM exercises. Hydrostatic pressure also  supports joints by unweighting joint load by at least 50 % in 3-4 feet depth water. 80% in chest to neck deep water. Water will provide assistance with movement using the current and laminar flow while the buoyancy reduces weight bearing. Pt requires the viscosity of the water for resistance endurance  with strengthening [facility] Adult PT Treatment:                                                 DATE: 10/03/23 Therapeutic Exercise: PPT -mod cues x 10 PPT with bent knee fall outs  x 10 PPT with march x 10 PPT with bridge  x 10 Modalities: E stim : IFC to tolerance to right lateral hip Ionto patch to right lateral hip with 1 ML dexamethasone- wear time 4 hours       [facility] Adult PT Treatment:                                                DATE: 09-29-23 Therapeutic Exercise: PPT x 10- mod cues VC for correct execution Hip adduction ball squeeze  Supine clam GTB bilateral  Supine alternating march with GTB Banded bridge GTB 10 x 2  Manual Therapy: STW Right hip piriformis and gluteal LAD Right hip with reduced pain afterwards  Trigger Point Dry Needling  Initial Treatment: Pt instructed on Dry Needling rational, procedures, and possible side effects. Pt instructed to expect mild to moderate muscle soreness later in the day and/or into the next day.  Pt instructed in methods to reduce muscle soreness. Pt instructed to continue prescribed HEP. Because Dry Needling was performed over or adjacent to a lung field, pt was educated on S/S of pneumothorax and to seek immediate medical attention should they occur.  Patient was educated on signs and symptoms of infection and other risk factors and advised to seek medical attention should they occur.  Patient verbalized understanding of these instructions and education.   Patient Verbal Consent Given: Yes Education Handout Provided: Yes Muscles Treated: Piriformis . Gluteals all on Right Electrical Stimulation Performed: No Treatment Response/Outcome: twitch response, decrease in pain/muscle tension   Therapeutic Activity: STS Red band at thighs 10 x 2  Standing hip abduction 10 x 2 each  09/27/23: Supine clam red band bilateral and alternating Banded bridge red band 10 x 2  Hip adduction ball squeeze  PPT x 10- mod cues  PPT with ball squeeze- mod cues STS Red band at thighs 10 x 2  Standing hip abduction 10  x 2 each  Tandem stance 25 sec x 2 with RLE back    09/15/23:  Therapeutic Exercise: Creating, reviewing, and completing below HEP  PATIENT EDUCATION ([place]/[facility]):  TENS, IONTO precautons and contraindications Pt educated via explanation, demonstration, and handout (HEP).  Pt confirms understanding verbally.   HOME EXERCISE PROGRAM: Access Code: [email or web address] URL: [email or web address] Date: 09/15/2023 Prepared by: Lesleigh Rash  Exercises - Supine Hip Adduction Isometric with Ball  - 1 x daily - 7 x weekly - 2 sets - 10 reps - 10'' hold - Hooklying Clamshell with Resistance  - 1 x daily - 7 x weekly - 3 sets - 10 reps - Bridge  - 1 x daily - 7 x  weekly - 3 sets - 10 reps - 5-10'' hold  Treatment priorities   Eval        Gentle progressive hip strengthening        Aquatic therapy        Look at balance                          ASSESSMENT:  CLINICAL IMPRESSION:  Patient presents to first aquatic PT session reporting 9/10 pain and at end of session was 3/10 improvement. [name] Rowe expresses pain is "knife-like"before entry into pool. Pt had ionto patch with 2 day relief.  Session today focused on establishing aquatic HEP, general strengthening, and balance tasks in the aquatic environment for use of buoyancy to offload joints and the viscosity of water as resistance during therapeutic exercise. Patient was able to tolerate all prescribed exercises in the aquatic environment with no adverse effects. Patient continues to benefit from skilled PT services on land and aquatic based and should be progressed as able to improve functional independenc    Pt arrives with significant pain in right hip and is tearful. She had short term relief with TPDN and pain returned and is rated at 10/10 today. Did some light cooking over the weekend which caused pain increase from the time she was standing. Pt likes to be active  and is limited due to pain. She is forced to sit more than she would  like due to pain. Today we continued with gentle therex and tried modalities for pain relief (estim and ionto). She will have aquatic therapy this week as well. Will assess response to these interventions next week.  If pain levels continue at this level, will recommend return to MD for further treatment options. She did have decreased pain after estim today and was provided info on where to purchase for home use.    EVAL: [name] Rowe is a 63 y.o. female who presents to clinic with signs and sxs consistent with R hip pain with known acetabular labral tear.  This is chronic and has been going on for years.  MD recommends THA but pt is too young.  Given high levels of pain and hx of fibromyalgia we will try a combination of land and aquatic exercise.  [name] Rowe will benefit from skilled PT to address relevant deficits and improve comfort in daily tasks including cleaning.   OBJECTIVE IMPAIRMENTS: Pain, R hip ROM, bil hip and LE strength, gait  ACTIVITY LIMITATIONS: bending, squatting, tranfers, housework, lifting  PERSONAL FACTORS: See medical history and pertinent history   REHAB POTENTIAL: Fair chronic with severe pain; see medical history  CLINICAL DECISION MAKING: Evolving/moderate complexity  EVALUATION COMPLEXITY: Moderate   GOALS:   SHORT TERM GOALS: Target date: 10/13/2023   [name] Rowe will be >75% HEP compliant to improve carryover between sessions and facilitate independent management of condition  Evaluation: ongoing Goal status: INITIAL   LONG TERM GOALS: Target date: 10/13/2023   [name] Rowe will self report >/= 50% decrease in pain from evaluation to improve function in daily tasks  Evaluation/Baseline: 10/10 max pain Goal status: INITIAL   2.  [name] Rowe will improve the following MMTs to >/= 4/5 to show improvement in strength:     Evaluation/Baseline:   LE MMT:  MMT Right (Eval) Left (Eval)  Hip flexion (L2, L3) 3* 4  Knee extension (L3) 3+* 4  Knee flexion 3+ 4   Hip abduction 3-* 3+  Hip extension    Hip external  rotation 3*   Hip internal rotation 3*   Hip adduction    Ankle dorsiflexion (L4)    Ankle plantarflexion (S1)    Ankle inversion    Ankle eversion    Great Toe ext (L5)    Grossly     (Blank rows = not tested, score listed is out of 5 possible points.  N = WNL, D = diminished, C = clear for gross weakness with myotome testing, * = concordant pain with testing)  Goal status: INITIAL   3.  [name] Rowe will be able to clean her house, not limited by pain  Evaluation/Baseline: limited Goal status: INITIAL   4.  [name] Rowe will show a >/= 18 pt improvement in LEFS score (MCID is ~11% or 9 pts) as a proxy for functional improvement   Evaluation/Baseline: 45 pts Goal status: INITIAL   5.  [name] Rowe will be able to walk outside for exercise and take pictures, not limited by pain  Evaluation/Baseline: limited Goal status: INITIAL   PLAN: PT FREQUENCY: 1-2x/week  PT DURATION: 8 weeks  PLANNED INTERVENTIONS:  [id number]- PT Re-evaluation, 97110-Therapeutic exercises, 97530- Therapeutic activity, [id number]- Neuromuscular re-education, 97535- Self Care, [id number]- Manual therapy, [id number]- Gait training, [id number]- Aquatic Therapy, [phone]- Electrical stimulation (manual), [id number]- Vasopneumatic device, [id number]- Traction (mechanical), [id number]- Ionotophoresis 4mg /ml Dexamethasone, Taping, Dry Needling, Joint manipulation, and Spinal manipulation.  [name] Rowe, PT, ATRIC Certified Exercise Expert for the Aging Adult  10/05/23 3:04 PM Phone: [phone] Fax: [phone]

## 2023-10-05 ENCOUNTER — Encounter: Payer: Self-pay | Admitting: Physical Therapy

## 2023-10-05 ENCOUNTER — Ambulatory Visit: Admitting: Physical Therapy

## 2023-10-05 DIAGNOSIS — M25551 Pain in right hip: Secondary | ICD-10-CM | POA: Diagnosis not present

## 2023-10-05 DIAGNOSIS — R2681 Unsteadiness on feet: Secondary | ICD-10-CM

## 2023-10-05 DIAGNOSIS — M6281 Muscle weakness (generalized): Secondary | ICD-10-CM

## 2023-10-10 ENCOUNTER — Ambulatory Visit: Attending: Orthopaedic Surgery | Admitting: Physical Therapy

## 2023-10-10 ENCOUNTER — Encounter: Payer: Self-pay | Admitting: Physical Therapy

## 2023-10-10 DIAGNOSIS — M25551 Pain in right hip: Secondary | ICD-10-CM | POA: Insufficient documentation

## 2023-10-10 DIAGNOSIS — M6281 Muscle weakness (generalized): Secondary | ICD-10-CM | POA: Insufficient documentation

## 2023-10-10 NOTE — Therapy (Addendum)
 PHYSICAL THERAPY DISCHARGE SUMMARY  Visits from Start of Care: 6  Current functional level related to goals / functional outcomes: See assessment/goals   Remaining deficits: See assessment/goals   Education / Equipment: HEP and D/C plans  Patient agrees to discharge. Patient goals were not met. Patient is being discharged due to lack of progress.  Patient Name: [name] Rowe MRN: [id number] DOB:1960-10-25, 63 y.o., female Today's Date: 10/10/2023   PT End of Session - 10/10/23 0933     Visit Number 6    Number of Visits 17    Date for PT Re-Evaluation 11/10/23    Authorization Type [employer] - LEFS    PT Start Time 0930    PT Stop Time 0953    PT Time Calculation (min) 23 min               Past Medical History:  Diagnosis Date   Adenomyosis    Anginal pain (HCC)    Anxiety 2001   Chest pain    a. No angiographic CAD by cath 2009 in [place]. There was catheter-induced RCA vasospasm versus microvascular obstruction. b. Lexiscan  myoview  04/2010 - EF 79%, normal perfusion.;  c. ETT-Myoview  7/13: no ischemia, EF 77%, submax exercise   Chronic back pain    had 3 epidural injections on 02-14-13   Chronic kidney disease    Complication of anesthesia    has difficulty voiding after surgery   Coronary vasospasm (HCC)    CVA (cerebral vascular accident) (HCC) 2006   lost hearing on left   Depression 2001   Dysrhythmia    Fibromuscular dysplasia (HCC) 2009   a. Renal artery stenosis due to this. b. Carotid dopplers 07/2011 c/w FMD but no significant stenosis   Fibromyalgia 1999   H/O Graves' disease 2003   s/p radio-iodine treatment in 2003    Hearing loss of left ear    s/p CVA 2006   History of stent insertion of renal artery 2009   right renal stent   HLD (hyperlipidemia) 2000   HTN (hypertension) 1988   a. Likely related to RAS; urinary metanephrines and plasma renin/aldosterone ratio normal. b. H/o HTN urgency 06/2011 after being off BP meds for a number  of days   Hx of echocardiogram    a. Echo (1/13): EF 55-60%, grade 1 diastolic dysfunction   Hypertensive crisis 06/29/2011   Hypothyroidism 2003   Graves disease s/p radio-iodine treatment in 2003    Irritable bowel syndrome    Leiomyoma    Migraine headache    Myocardial infarction [facility]) 2009   Personal history of colonic polyps-sessile serrated adenoma 02/23/2013   Renal artery stenosis (HCC)    a. Due to fibromuscular dysplasia. b. Renal stent 2009. c. Studies:  CTA abdomen (1/13) with evidence for fibromuscular dysplasia, right renal artery stent appears patent. Renal artery dopplers (2/13) with FMD but no evidence for signficant stenosis.    Tachycardia 2000   10/27/10-Holter -NSR with occ sinus tachy-rare PACs, no sig arrhythmia   Past Surgical History:  Procedure Laterality Date   BACK SURGERY  2010 and 2011   In CT.  11/19/2008 (possibly a laminectomy) and in May of 2011 she underwent a L2-L3 fusion.   cardiac cath     CARDIAC CATHETERIZATION  2009   states it was normal   CARDIAC CATHETERIZATION N/A 02/03/2015   Procedure: Left Heart Cath and Coronary Angiography;  Surgeon: Darlis Eisenmenger, MD;  Location: [facility] INVASIVE CV LAB;  Service: Cardiovascular;  Laterality: N/A;   COLONOSCOPY  last 02/16/2013   COLONOSCOPY     HIP SURGERY Bilateral 1988 and 1989   "scraped head of femur"   KNEE ARTHROSCOPY Left    LUMBAR LAMINECTOMY/DECOMPRESSION MICRODISCECTOMY Left 05/25/2013   Procedure: LUMBAR LAMINECTOMY/DECOMPRESSION MICRODISCECTOMY 1 LEVEL;  Surgeon: Pasty Bongo, MD;  Location: [facility] NEURO ORS;  Service: Neurosurgery;  Laterality: Left;  LEFT L5S1 microdiskectomy   POLYPECTOMY     RENAL ARTERY STENT  2009   ROBOTIC ASSISTED LAP VAGINAL HYSTERECTOMY  05/13/2008   [facility] da Vinci hysterectomy  for myomatous uterus    SPINE SURGERY     VAGINAL HYSTERECTOMY     Patient Active Problem List   Diagnosis Date Noted   CAD (coronary artery disease) 06/19/2018   At risk for sleep apnea  05/04/2017   Tachycardia 04/05/2017   Poor sleep pattern 10/20/2016   Fatigue 04/07/2016   Neck pain on left side 03/16/2016   Acute left-sided thoracic back pain 03/16/2016   Pain of left breast 10/30/2015   Lichen sclerosus 08/15/2014   Extremity edema 06/24/2014   Diplopia    History of coronary vasospasm 04/12/2014   Bursitis, trochanteric 11/22/2013   Essential hypertension 11/19/2013   Acute low back pain 08/10/2013   History of colonic polyps 02/23/2013   HNP (herniated nucleus pulposus), lumbar 12/01/2012   Lumbar radicular syndrome 12/01/2012   Seasonal allergies 11/30/2012   Intervertebral disc protrusion 11/06/2012   Situational anxiety 09/05/2012   Vitamin D  insufficiency 06/30/2012   Unilateral hearing loss 06/30/2012   Vestibular dizziness 06/30/2012   Routine adult health maintenance 03/31/2012   Fibromuscular dysplasia (HCC) 08/13/2011   Dizziness and palpitations, episodic 07/06/2011   Poorly-controlled hypertension    History of CVA (cerebrovascular accident)    Hypothyroidism 04/10/2010   Dyslipidemia 04/10/2010   Anxiety 04/10/2010   Depression 04/10/2010   Migraine headache 04/10/2010   Renal artery stenosis (HCC) 04/10/2010   Fibromyalgia 04/10/2010   Status post lumbar surgery 04/10/2010   Backache 04/09/2010    PCP: Arch Beans, PA-C  REFERRING PROVIDER: Wes Hamman, MD  THERAPY DIAG:  Pain in right hip  Muscle weakness  REFERRING DIAG: Pain in right hip [M25.551]   Rationale for Evaluation and Treatment:  Rehabilitation  SUBJECTIVE:  PERTINENT PAST HISTORY:  Anxiety, depression, fibromyalgia, CVA (2007, 2022), CAD, lumbar fusion, MI 2009, OCD      PRECAUTIONS: None  WEIGHT BEARING RESTRICTIONS No  FALLS:  Has patient fallen in last 6 months? No, Number of falls: 0  MOI/History of condition:  Onset date: November 2023  SUBJECTIVE STATEMENT The pain wakes me up and I have to modify everything I do because of  the pain including sitting on the toilet, brushing my teeth and trying to sleep. I got a TENS unit and I have ointment that I use. It all helps a little, but the pain always comes back. Pain is 9/10.     EVAL -[name] Rowe is a 63 y.o. female who presents to clinic with chief complaint of R hip pain.  This started in suddenly with no acute pain or injury.  Recent MRI shows torn anterior superior labrum.     Red flags:  denies   Pain:  Are you having pain? Yes Pain location: R hip pain NPRS scale:  9/10 to 10/10 Aggravating factors: sitting, standing, walking Relieving factors: hip flexion Pain description: sharp, stabbing, and aching Stage: Chronic   Occupation: NA  Assistive Device: na  Hand Dominance:  na  Patient Goals/Specific Activities: reduce pain, avoid surgery if possible, clean   OBJECTIVE:   DIAGNOSTIC FINDINGS:  Recent MRI shows torn anterior superior labrum  GENERAL OBSERVATION/GAIT: Reduced time in stance R hip, antalgic gait  SENSATION: Light touch: Appears intact  LE MMT:  MMT Right (Eval) Left (Eval)  Hip flexion (L2, L3) 3* 4  Knee extension (L3) 3+* 4  Knee flexion 3+ 4  Hip abduction 3-* 3+  Hip extension    Hip external rotation 3*   Hip internal rotation 3*   Hip adduction    Ankle dorsiflexion (L4)    Ankle plantarflexion (S1)    Ankle inversion    Ankle eversion    Great Toe ext (L5)    Grossly     (Blank rows = not tested, score listed is out of 5 possible points.  N = WNL, D = diminished, C = clear for gross weakness with myotome testing, * = concordant pain with testing)  LE ROM:  ROM Right (Eval) Left (Eval)  Hip flexion    Hip extension    Hip abduction    Hip adduction    Hip internal rotation Limited painful   Hip external rotation N painful   Knee extension    Knee flexion    Ankle dorsiflexion    Ankle plantarflexion    Ankle inversion    Ankle eversion     (Blank rows = not tested, N = WNL, * =  concordant pain with testing)  Functional Tests  Eval    30'' STS: 13x  UE used? N                                                          PATIENT SURVEYS:  LEFS: 45/80 10/10/23: LEFS 18/80   TODAY'S TREATMENT: [facility] Adult PT Treatment:                                                DATE: 10/10/23  Therapeutic Activity: Review of HEP- unable to tolerate without 9-1/10 pain LEFS 18/80 Goal check  POC/ progressed discussed     [facility] Adult PT Treatment:                                                DATE: 10-05-23 Aquatic therapy at [facility]- [address] - therapeutic pool temp approximately 92 degrees.Pt enters building independently. Treatment took place in water 3.8 to  4 ft 8 in. deep depending upon activity.  Pt entered and exited the pool via stair and handrails independently. Pt pain level 9/10 at initiation and 3/10 at end of session     Afomia was educated on beneficial therapeutic effects of water while ambulating to acclimate to water walking forward, backward and side stepping.  Pt educated on neutral posture and hip hinging in seated position with water at chest level x 10 with stretch to low back and then x 10 with back at pool wall at external cue, VC for neck tucked to prevent hyperextension. Aqua Stretch for  Right hip/gluteals with moderate relief  Aquatic Exercise: On submerged bench and/or on edge of pool holding with one UE   Walking side forward and side stepping across pool x 4 Heel/ toe raises BIL   Hip ext/flex with knee straight  Hip abduction/adduction x10 BIL Hamstring curl Hip circles CW/CCW  pt fatiguing Stomp with yellow noodle  can only do left side Squats x 20 Quad stretch 3-way stretch with pool noodle, IT band, groin and hamstring Runners stretch bottom step x30" BIL Hamstring stretch bottom step x30" BIL Back L stretch   Pt requires the buoyancy of water for active assisted exercises with buoyancy supported for  strengthening and AROM exercises. Hydrostatic pressure also supports joints by unweighting joint load by at least 50 % in 3-4 feet depth water. 80% in chest to neck deep water. Water will provide assistance with movement using the current and laminar flow while the buoyancy reduces weight bearing. Pt requires the viscosity of the water for resistance endurance  with strengthening [facility] Adult PT Treatment:                                                DATE: 10/03/23 Therapeutic Exercise: PPT -mod cues x 10 PPT with bent knee fall outs  x 10 PPT with march x 10 PPT with bridge  x 10 Modalities: E stim : IFC to tolerance to right lateral hip Ionto patch to right lateral hip with 1 ML dexamethasone- wear time 4 hours       [facility] Adult PT Treatment:                                                DATE: 09-29-23 Therapeutic Exercise: PPT x 10- mod cues VC for correct execution Hip adduction ball squeeze  Supine clam GTB bilateral  Supine alternating march with GTB Banded bridge GTB 10 x 2  Manual Therapy: STW Right hip piriformis and gluteal LAD Right hip with reduced pain afterwards  Trigger Point Dry Needling  Initial Treatment: Pt instructed on Dry Needling rational, procedures, and possible side effects. Pt instructed to expect mild to moderate muscle soreness later in the day and/or into the next day.  Pt instructed in methods to reduce muscle soreness. Pt instructed to continue prescribed HEP. Because Dry Needling was performed over or adjacent to a lung field, pt was educated on S/S of pneumothorax and to seek immediate medical attention should they occur.  Patient was educated on signs and symptoms of infection and other risk factors and advised to seek medical attention should they occur.  Patient verbalized understanding of these instructions and education.   Patient Verbal Consent Given: Yes Education Handout Provided: Yes Muscles Treated: Piriformis . Gluteals all on  Right Electrical Stimulation Performed: No Treatment Response/Outcome: twitch response, decrease in pain/muscle tension   Therapeutic Activity: STS Red band at thighs 10 x 2  Standing hip abduction 10 x 2 each  09/27/23: Supine clam red band bilateral and alternating Banded bridge red band 10 x 2  Hip adduction ball squeeze  PPT x 10- mod cues  PPT with ball squeeze- mod cues STS Red band at thighs 10 x 2  Standing hip abduction 10 x 2 each  Tandem stance 25 sec x 2 with RLE back    09/15/23:  Therapeutic Exercise: Creating, reviewing, and completing below HEP  PATIENT EDUCATION ([place]/[facility]):  TENS, IONTO precautons and contraindications Pt educated via explanation, demonstration, and handout (HEP).  Pt confirms understanding verbally.   HOME EXERCISE PROGRAM: Access Code: [email or web address] URL: [email or web address] Date: 09/15/2023 Prepared by: Lesleigh Rash  Exercises - Supine Hip Adduction Isometric with Ball  - 1 x daily - 7 x weekly - 2 sets - 10 reps - 10'' hold - Hooklying Clamshell with Resistance  - 1 x daily - 7 x weekly - 3 sets - 10 reps - Bridge  - 1 x daily - 7 x weekly - 3 sets - 10 reps - 5-10'' hold  Treatment priorities   Eval        Gentle progressive hip strengthening        Aquatic therapy        Look at balance                          ASSESSMENT:  CLINICAL IMPRESSION: Pt reports short term relief was provided by aquatic therapy. She feels a little better in her hip after PT sessions however the next day the pain returns and she has significant difficulty with ADLs. She has tried aquatics, iontophoresis, Estim and muscle rubs, all which provide temporary reduction in pain. She has acquired a TENS unit for home use. She has completed 6 PT sessions with no improvement. Today she is unable to tolerate review of her HEP due to her pain level. She declined modalities today. At this time, it is appropriate to discontinue PT and send her back to  referring MD for further evaluation. Pt is in agreement.      EVAL: Wetona is a 63 y.o. female who presents to clinic with signs and sxs consistent with R hip pain with known acetabular labral tear.  This is chronic and has been going on for years.  MD recommends THA but pt is too young.  Given high levels of pain and hx of fibromyalgia we will try a combination of land and aquatic exercise.  Isaura will benefit from skilled PT to address relevant deficits and improve comfort in daily tasks including cleaning.   OBJECTIVE IMPAIRMENTS: Pain, R hip ROM, bil hip and LE strength, gait  ACTIVITY LIMITATIONS: bending, squatting, tranfers, housework, lifting  PERSONAL FACTORS: See medical history and pertinent history   REHAB POTENTIAL: Fair chronic with severe pain; see medical history  CLINICAL DECISION MAKING: Evolving/moderate complexity  EVALUATION COMPLEXITY: Moderate   GOALS:   SHORT TERM GOALS: Target date: 10/13/2023   Kalani will be >75% HEP compliant to improve carryover between sessions and facilitate independent management of condition  Evaluation: ongoing Goal status: MET   LONG TERM GOALS: Target date: 10/13/2023   Nicolet will self report >/= 50% decrease in pain from evaluation to improve function in daily tasks  Evaluation/Baseline: 10/10 max pain 10/10/23: no change in pain Goal status: NOT MET   2.  Thamar will improve the following MMTs to >/= 4/5 to show improvement in strength:     Evaluation/Baseline:   LE MMT:  MMT Right (Eval) Left (Eval)  Hip flexion (L2, L3) 3* 4  Knee extension (L3) 3+* 4  Knee flexion 3+ 4  Hip abduction 3-* 3+  Hip extension    Hip external rotation 3*   Hip internal rotation 3*   Hip adduction  Ankle dorsiflexion (L4)    Ankle plantarflexion (S1)    Ankle inversion    Ankle eversion    Great Toe ext (L5)    Grossly     (Blank rows = not tested, score listed is out of 5 possible points.  N = WNL, D =  diminished, C = clear for gross weakness with myotome testing, * = concordant pain with testing) 10/10/23: no change, limited tolerance to testing Goal status: NOT MET   3.  Alizia will be able to clean her house, not limited by pain  Evaluation/Baseline: limited 10/10/23: no improvement Goal status: NOT MET   4.  Ewelina will show a >/= 18 pt improvement in LEFS score (MCID is ~11% or 9 pts) as a proxy for functional improvement   Evaluation/Baseline: 45 pts 10/10/23: 18/80 Goal status: NOT MET   5.  Danyeal will be able to walk outside for exercise and take pictures, not limited by pain  Evaluation/Baseline: limited 10/10/23: unable to tolerate Goal status: NOT MET   PLAN: PT FREQUENCY: 1-2x/week  PT DURATION: 8 weeks  PLANNED INTERVENTIONS:  [id number]- PT Re-evaluation, 97110-Therapeutic exercises, 97530- Therapeutic activity, 97112- Neuromuscular re-education, 97535- Self Care, [id number]- Manual therapy, [id number]- Gait training, [id number]- Aquatic Therapy, [phone]- Electrical stimulation (manual), [id number]- Vasopneumatic device, [id number]- Traction (mechanical), [id number]- Ionotophoresis 4mg /ml Dexamethasone, Taping, Dry Needling, Joint manipulation, and Spinal manipulation.  Gasper Karst, PTA 10/10/23 9:58 AM Phone: [phone] Fax: [phone]

## 2023-10-12 ENCOUNTER — Ambulatory Visit: Admitting: Physical Therapy

## 2023-10-24 NOTE — Progress Notes (Signed)
 Office Visit Note   Patient: [name] Rowe           Date of Birth: 09-26-1960           MRN: [id number] Visit Date: 10/25/2023              Requested by: Podraza, Cole Christopher, PA-C [id number] [place],  [place] [address] PCP: Podraza, Cole Christopher, PA-C   Assessment & Plan: Visit Diagnoses:  1. Pain in right hip     Plan: History of Present Illness [name] Rowe is a 63 year old female with arthritis who presents with worsening back and right hip pain.  She experiences worsening back pain, described as a pinching sensation, especially when coughing or lying down. The pain has been progressively worsening, and she often presses on her back for relief. Physical therapy, including dry needling, has not improved her symptoms.  She has significant hip pain, more severe in the right hip. Cortisone injections for her hips and back provide relief for four to five weeks but the pain returns. The hip pain feels like 'a knife' and can be so severe that she is unable to move from her bed.  Tylenol , including Tylenol  PM, is no longer effective in managing her pain or aiding sleep. She sleeps only two to three hours at a time due to the pain.  Results RADIOLOGY Lumbar spine X-ray: Lumbar spine arthritis Hip MRI: Mild hip osteoarthritis (08/2023)  Assessment and Plan Mild right hip arthritis Chronic hip pain with severe symptoms. MRI shows mild arthritis. Current acetaminophen  ineffective. Potential for future hip replacement if condition worsens. - Refer to pain management clinic for further evaluation and management.  Back arthritis Chronic back pain with pinching sensation. X-rays indicate arthritis. Symptoms suggest possible spine issue. - Refer to Dr. Benedetta Bradley for further evaluation and management.  Follow-Up Instructions: No follow-ups on file.   Orders:  Orders Placed This Encounter  Procedures   Ambulatory referral to Pain Clinic   No orders of the  defined types were placed in this encounter.  Subjective: Chief Complaint  Patient presents with   Right Hip - Pain   Lower Back - Pain    Review of Systems  Constitutional: Negative.   HENT: Negative.    Eyes: Negative.   Respiratory: Negative.    Cardiovascular: Negative.   Endocrine: Negative.   Musculoskeletal: Negative.   Neurological: Negative.   Hematological: Negative.   Psychiatric/Behavioral: Negative.    All other systems reviewed and are negative.    Objective: Vital Signs: There were no vitals taken for this visit.  Physical Exam Vitals and nursing note reviewed.  Constitutional:      Appearance: She is well-developed.  HENT:     Head: Atraumatic.     Nose: Nose normal.  Eyes:     Extraocular Movements: Extraocular movements intact.  Cardiovascular:     Pulses: Normal pulses.  Pulmonary:     Effort: Pulmonary effort is normal.  Abdominal:     Palpations: Abdomen is soft.  Musculoskeletal:     Cervical back: Neck supple.  Skin:    General: Skin is warm.     Capillary Refill: Capillary refill takes less than 2 seconds.  Neurological:     Mental Status: She is alert. Mental status is at baseline.  Psychiatric:        Behavior: Behavior normal.        Thought Content: Thought content normal.  Judgment: Judgment normal.    Imaging: No results found.   PMFS History: Patient Active Problem List   Diagnosis Date Noted   CAD (coronary artery disease) 06/19/2018   At risk for sleep apnea 05/04/2017   Tachycardia 04/05/2017   Poor sleep pattern 10/20/2016   Fatigue 04/07/2016   Neck pain on left side 03/16/2016   Acute left-sided thoracic back pain 03/16/2016   Pain of left breast 10/30/2015   Lichen sclerosus 08/15/2014   Extremity edema 06/24/2014   Diplopia    History of coronary vasospasm 04/12/2014   Bursitis, trochanteric 11/22/2013   Essential hypertension 11/19/2013   Acute low back pain 08/10/2013   History of colonic  polyps 02/23/2013   HNP (herniated nucleus pulposus), lumbar 12/01/2012   Lumbar radicular syndrome 12/01/2012   Seasonal allergies 11/30/2012   Intervertebral disc protrusion 11/06/2012   Situational anxiety 09/05/2012   Vitamin D  insufficiency 06/30/2012   Unilateral hearing loss 06/30/2012   Vestibular dizziness 06/30/2012   Routine adult health maintenance 03/31/2012   Fibromuscular dysplasia (HCC) 08/13/2011   Dizziness and palpitations, episodic 07/06/2011   Poorly-controlled hypertension    History of CVA (cerebrovascular accident)    Hypothyroidism 04/10/2010   Dyslipidemia 04/10/2010   Anxiety 04/10/2010   Depression 04/10/2010   Migraine headache 04/10/2010   Renal artery stenosis (HCC) 04/10/2010   Fibromyalgia 04/10/2010   Status post lumbar surgery 04/10/2010   Backache 04/09/2010   Past Medical History:  Diagnosis Date   Adenomyosis    Anginal pain (HCC)    Anxiety 2001   Chest pain    a. No angiographic CAD by cath 2009 in [place]. There was catheter-induced RCA vasospasm versus microvascular obstruction. b. Lexiscan  myoview  04/2010 - EF 79%, normal perfusion.;  c. ETT-Myoview  7/13: no ischemia, EF 77%, submax exercise   Chronic back pain    had 3 epidural injections on 02-14-13   Chronic kidney disease    Complication of anesthesia    has difficulty voiding after surgery   Coronary vasospasm (HCC)    CVA (cerebral vascular accident) (HCC) 2006   lost hearing on left   Depression 2001   Dysrhythmia    Fibromuscular dysplasia (HCC) 2009   a. Renal artery stenosis due to this. b. Carotid dopplers 07/2011 c/w FMD but no significant stenosis   Fibromyalgia 1999   H/O Graves' disease 2003   s/p radio-iodine treatment in 2003    Hearing loss of left ear    s/p CVA 2006   History of stent insertion of renal artery 2009   right renal stent   HLD (hyperlipidemia) 2000   HTN (hypertension) 1988   a. Likely related to RAS; urinary metanephrines and plasma  renin/aldosterone ratio normal. b. H/o HTN urgency 06/2011 after being off BP meds for a number of days   Hx of echocardiogram    a. Echo (1/13): EF 55-60%, grade 1 diastolic dysfunction   Hypertensive crisis 06/29/2011   Hypothyroidism 2003   Graves disease s/p radio-iodine treatment in 2003    Irritable bowel syndrome    Leiomyoma    Migraine headache    Myocardial infarction [facility]) 2009   Personal history of colonic polyps-sessile serrated adenoma 02/23/2013   Renal artery stenosis (HCC)    a. Due to fibromuscular dysplasia. b. Renal stent 2009. c. Studies:  CTA abdomen (1/13) with evidence for fibromuscular dysplasia, right renal artery stent appears patent. Renal artery dopplers (2/13) with FMD but no evidence for signficant stenosis.    Tachycardia  2000   10/27/10-Holter -NSR with occ sinus tachy-rare PACs, no sig arrhythmia    Family History  Problem Relation Age of Onset   Heart disease Mother    Hypertension Mother    Asthma Mother    Hyperlipidemia Mother    Stroke Mother    Heart disease Father    Hypertension Father    Prostate cancer Father    Heart attack Father    Hyperlipidemia Father    Stroke Father    Metabolic syndrome Sister    Depression Sister    Hypertension Sister    Hypertension Brother    Heart attack Brother 9   Early death Brother 63       massive stroke    Hyperlipidemia Brother    Diabetes Maternal Aunt    Stomach cancer Maternal Aunt    Stomach cancer Son    Heart attack Other    Stroke Other    Colon cancer Neg Hx    Colon polyps Neg Hx    Esophageal cancer Neg Hx    Rectal cancer Neg Hx     Past Surgical History:  Procedure Laterality Date   BACK SURGERY  2010 and 2011   In CT.  11/19/2008 (possibly a laminectomy) and in May of 2011 she underwent a L2-L3 fusion.   cardiac cath     CARDIAC CATHETERIZATION  2009   states it was normal   CARDIAC CATHETERIZATION N/A 02/03/2015   Procedure: Left Heart Cath and Coronary Angiography;   Surgeon: Darlis Eisenmenger, MD;  Location: [facility] INVASIVE CV LAB;  Service: Cardiovascular;  Laterality: N/A;   COLONOSCOPY  last 02/16/2013   COLONOSCOPY     HIP SURGERY Bilateral 1988 and 1989   "scraped head of femur"   KNEE ARTHROSCOPY Left    LUMBAR LAMINECTOMY/DECOMPRESSION MICRODISCECTOMY Left 05/25/2013   Procedure: LUMBAR LAMINECTOMY/DECOMPRESSION MICRODISCECTOMY 1 LEVEL;  Surgeon: Pasty Bongo, MD;  Location: [facility] NEURO ORS;  Service: Neurosurgery;  Laterality: Left;  LEFT L5S1 microdiskectomy   POLYPECTOMY     RENAL ARTERY STENT  2009   ROBOTIC ASSISTED LAP VAGINAL HYSTERECTOMY  05/13/2008   [facility] da Vinci hysterectomy  for myomatous uterus    SPINE SURGERY     VAGINAL HYSTERECTOMY     Social History   Occupational History   Occupation: worked in a Museum/gallery conservator.    Employer: UNEMPLOYED    Comment: Quit about a yr ago because fo work related injury to left knee  Tobacco Use   Smoking status: Never   Smokeless tobacco: Never  Vaping Use   Vaping status: Never Used  Substance and Sexual Activity   Alcohol use: Not Currently    Comment: rarely   Drug use: No   Sexual activity: Not on file

## 2023-10-25 ENCOUNTER — Ambulatory Visit: Admitting: Orthopaedic Surgery

## 2023-10-25 DIAGNOSIS — M25551 Pain in right hip: Secondary | ICD-10-CM | POA: Diagnosis not present

## 2023-12-28 ENCOUNTER — Emergency Department (HOSPITAL_COMMUNITY)

## 2023-12-28 ENCOUNTER — Emergency Department (HOSPITAL_COMMUNITY): Admission: EM | Admit: 2023-12-28 | Discharge: 2023-12-28 | Disposition: A | Discharge status: Home / Self Care

## 2023-12-28 DIAGNOSIS — R519 Headache, unspecified: Secondary | ICD-10-CM | POA: Diagnosis not present

## 2023-12-28 DIAGNOSIS — R0789 Other chest pain: Secondary | ICD-10-CM | POA: Diagnosis present

## 2023-12-28 LAB — CBC
HCT: 41.1 % (ref 36.0–46.0)
Hemoglobin: 14.3 g/dL (ref 12.0–15.0)
MCH: 29.8 pg (ref 26.0–34.0)
MCHC: 34.8 g/dL (ref 30.0–36.0)
MCV: 85.6 fL (ref 80.0–100.0)
Platelets: 344 K/uL (ref 150–400)
RBC: 4.8 MIL/uL (ref 3.87–5.11)
RDW: 12.1 % (ref 11.5–15.5)
WBC: 6.6 K/uL (ref 4.0–10.5)
nRBC: 0 % (ref 0.0–0.2)

## 2023-12-28 LAB — BASIC METABOLIC PANEL WITH GFR
Anion gap: 8 (ref 5–15)
BUN: 18 mg/dL (ref 8–23)
CO2: 21 mmol/L — ABNORMAL LOW (ref 22–32)
Calcium: 9.2 mg/dL (ref 8.9–10.3)
Chloride: 110 mmol/L (ref 98–111)
Creatinine, Ser: 1.23 mg/dL — ABNORMAL HIGH (ref 0.44–1.00)
GFR, Estimated: 50 mL/min — ABNORMAL LOW (ref >60)
Glucose, Bld: 157 mg/dL — ABNORMAL HIGH (ref 70–99)
Potassium: 3.7 mmol/L (ref 3.5–5.1)
Sodium: 139 mmol/L (ref 135–145)

## 2023-12-28 LAB — TROPONIN I (HIGH SENSITIVITY)
Troponin I (High Sensitivity): 4 ng/L (ref <18)
Troponin I (High Sensitivity): 6 ng/L (ref <18)

## 2023-12-28 MED ORDER — DIPHENHYDRAMINE HCL 50 MG/ML IJ SOLN
12.5000 mg | Freq: Once | INTRAMUSCULAR | Status: AC
  Administered 2023-12-28: 12.5 mg via INTRAVENOUS
  Filled 2023-12-28: qty 1

## 2023-12-28 MED ORDER — METOCLOPRAMIDE HCL 5 MG/ML IJ SOLN
10.0000 mg | Freq: Once | INTRAMUSCULAR | Status: AC
  Administered 2023-12-28: 10 mg via INTRAVENOUS
  Filled 2023-12-28: qty 2

## 2023-12-28 MED ORDER — SODIUM CHLORIDE 0.9 % IV BOLUS
500.0000 mL | Freq: Once | INTRAVENOUS | Status: AC
  Administered 2023-12-28: 500 mL via INTRAVENOUS

## 2023-12-28 NOTE — ED Provider Notes (Signed)
 [facility] [facility] AT [facility] Provider Note   CSN: [id number] Arrival date & time: 12/28/23  2010     Patient presents with: Chest Pain   [name] Rowe is a 63 y.o. female.   63 year old female with a past medical history of MI, stroke presents to the ED via EMS with a chief complaint of left-sided chest pain.  Patient reports her symptoms have been ongoing since Sunday however they worsened yesterday as they found her neighbor dead as she was murder.  She is also endorsing a headache along with some nausea, has a history of microbleed, reports that her neurologist does not want her to take anything that can make her head hurt.  She reports pain along the substernal area sharp and stabbing radiating to her left shoulder.  No alleviating or exacerbating factors.  Did have a sudden onset of worsening pain today.  She was offered nitroglycerin  by EMS, however reports she did not want to take this as this makes her head hurt worse.  She is not having any shortness of breath, no fever, no cough, no leg swellings.  She does have prior history of a heart stent in 2021, prior MI in 2009, and multiple strokes in 2006 and 2020.  Reports some left arm weakness from her prior strokes.  The history is provided by the patient.  Chest Pain Pain location:  L chest Pain quality: sharp   Pain radiates to:  L shoulder Pain severity:  Moderate Onset quality:  Sudden Timing:  Constant Progression:  Worsening Chronicity:  New Relieved by:  Nothing Worsened by:  Nothing Ineffective treatments:  Aspirin  Associated symptoms: headache and shortness of breath   Associated symptoms: no abdominal pain, no anxiety, no back pain, no fever, no nausea and no vomiting        Prior to Admission medications   Medication Sig Start Date End Date Taking? Authorizing Provider  acetaminophen -codeine  (TYLENOL  #3) 300-30 MG tablet Take 1 tablet by mouth every 4 (four) hours as needed for  moderate pain (pain score 4-6). 03/18/23   [provider]  amLODipine  (NORVASC ) 5 MG tablet Take 1 tablet (5 mg total) by mouth daily. 09/14/23   [place], Peter M, MD  ezetimibe  (ZETIA ) 10 MG tablet Take 1 tablet (10 mg total) by mouth daily. 09/14/23 12/13/23  [place], Peter M, MD  gabapentin  (NEURONTIN ) 100 MG capsule Take 300 mg by mouth at bedtime.    [provider]  hydrOXYzine  (VISTARIL ) 25 MG capsule Take 25 mg by mouth at bedtime.    [provider]  levothyroxine  (SYNTHROID ) 125 MCG tablet Take 125 mcg by mouth every other day. 02/10/23   [provider]  lidocaine  (LIDODERM ) 5 % Place 1 patch onto the skin daily. Remove & Discard patch within 12 hours or as directed by MD 08/27/23   Hildegard Loge, PA-C  LINZESS  290 MCG CAPS capsule TAKE 1 CAPSULE BY MOUTH DAILY 02/02/19   Francesco Alan BROCKS, MD  methocarbamol  (ROBAXIN ) 500 MG tablet Take 1 tablet (500 mg total) by mouth 2 (two) times daily. 08/27/23   Hildegard Loge, PA-C  metoprolol  succinate (TOPROL -XL) 100 MG 24 hr tablet Take 1 tablet (100 mg total) by mouth daily. Take with or immediately following a meal. 09/14/23   [place], Peter M, MD  nitroGLYCERIN  (NITROSTAT ) 0.4 MG SL tablet Place 1 tablet (0.4 mg total) under the tongue every 5 (five) minutes as needed. x3 doses as needed for chest pain 09/14/23  [place], Peter M, MD  rosuvastatin  (CRESTOR ) 40 MG tablet Take 1 tablet (40 mg total) by mouth daily. 09/14/23   [place], Peter M, MD  topiramate (TOPAMAX) 100 MG tablet Take 100 mg by mouth daily. 03/03/22   [provider]  venlafaxine XR (EFFEXOR-XR) 75 MG 24 hr capsule Take 75 mg by mouth daily. 12/30/22   [provider]  Vitamin D , Ergocalciferol , (DRISDOL ) 1.25 MG (50000 UNIT) CAPS capsule Take 50,000 Units by mouth every Sunday. 04/02/22   [provider]    Allergies: Hydromorphone hcl, Prochlorperazine edisylate, Sumatriptan, Atorvastatin , Lisinopril, and Tramadol     Review of Systems   Constitutional:  Negative for chills and fever.  HENT:  Negative for sore throat.   Respiratory:  Positive for shortness of breath.   Cardiovascular:  Positive for chest pain.  Gastrointestinal:  Negative for abdominal pain, nausea and vomiting.  Genitourinary:  Negative for flank pain.  Musculoskeletal:  Negative for back pain.  Neurological:  Positive for headaches.  All other systems reviewed and are negative.   Updated Vital Signs BP 115/80   Pulse 81   Temp 99.4 F (37.4 C) (Oral)   Resp 12   SpO2 100%   Physical Exam Vitals and nursing note reviewed.  Constitutional:      Appearance: She is well-developed.  HENT:     Head: Normocephalic and atraumatic.  Cardiovascular:     Rate and Rhythm: Normal rate.  Pulmonary:     Effort: Pulmonary effort is normal. No tachypnea.     Breath sounds: No decreased breath sounds or wheezing.  Chest:     Chest wall: No tenderness.  Abdominal:     Palpations: Abdomen is soft.  Musculoskeletal:     Cervical back: Normal range of motion and neck supple.     Right lower leg: No edema.     Left lower leg: No edema.  Skin:    General: Skin is warm and dry.  Neurological:     Mental Status: She is alert and oriented to person, place, and time.     (all labs ordered are listed, but only abnormal results are displayed) Labs Reviewed  BASIC METABOLIC PANEL WITH GFR - Abnormal; Notable for the following components:      Result Value   CO2 21 (*)    Glucose, Bld 157 (*)    Creatinine, Ser 1.23 (*)    GFR, Estimated 50 (*)    All other components within normal limits  CBC  TROPONIN I (HIGH SENSITIVITY)  TROPONIN I (HIGH SENSITIVITY)    EKG: EKG Interpretation Date/Time:  Wednesday December 28 2023 20:19:25 EDT Ventricular Rate:  94 PR Interval:  179 QRS Duration:  91 QT Interval:  381 QTC Calculation: 477 R Axis:   36  Text Interpretation: Sinus rhythm Confirmed by Ula Barter [id number]) on 12/28/2023 8:45:41  PM  Radiology: CT HEAD WO CONTRAST ( ) Result Date: 12/28/2023 EXAM: CT HEAD WITHOUT CONTRAST 12/28/2023 10:37:40 PM TECHNIQUE: CT of the head was performed without the administration of intravenous contrast. Automated exposure control, iterative reconstruction, and/or weight based adjustment of the mA/kV was utilized to reduce the radiation dose to as low as reasonably achievable. COMPARISON: 09/11/2022 CLINICAL HISTORY: Head trauma, minor, normal mental status (Age 25-64y). Headache, n/v, HTN, hx of CVA. FINDINGS: BRAIN AND VENTRICLES: No acute hemorrhage. Gray-white differentiation is preserved. No hydrocephalus. No extra-axial collection. No mass effect or midline shift. Mild subcortical and periventricular small vessel ischemic changes. ORBITS: No acute abnormality. SINUSES:  No acute abnormality. SOFT TISSUES AND SKULL: No acute soft tissue abnormality. No skull fracture. IMPRESSION: 1. No acute intracranial abnormality. 2. Mild small vessel ischemic changes. Electronically signed by: Pinkie Pebbles MD 12/28/2023 10:40 PM EDT RP Workstation: [id number]   DG Chest Port 1 View Result Date: 12/28/2023 CLINICAL DATA:  Chest pain EXAM: PORTABLE CHEST 1 VIEW COMPARISON:  03/21/2023 FINDINGS: The heart size and mediastinal contours are within normal limits. Both lungs are clear. The visualized skeletal structures are unremarkable. IMPRESSION: No active disease. Electronically Signed   By: Luke Bun M.D.   On: 12/28/2023 21:19     Procedures   Medications Ordered in the ED  diphenhydrAMINE  (BENADRYL ) injection 12.5 mg (12.5 mg Intravenous Given 12/28/23 2226)  metoCLOPramide  (REGLAN ) injection 10 mg (10 mg Intravenous Given 12/28/23 2227)  sodium chloride  0.9 % bolus 500 mL (500 mLs Intravenous New Bag/Given 12/28/23 2243)                                    Medical Decision Making Amount and/or Complexity of Data Reviewed Labs: ordered. Radiology: ordered.  Risk Prescription drug  management.   This patient presents to the ED for concern of chest pain, this involves a number of treatment options, and is a complaint that carries with it a high risk of complications and morbidity.  The differential diagnosis includes ACS, PE, MSK versus anxiety.    Co morbidities: Discussed in HPI   Brief History:  SEE HPI.   EMR reviewed including pt PMHx, past surgical history and past visits to ER.   See HPI for more details   Lab Tests:  I ordered and independently interpreted labs.  The pertinent results include:    I personally reviewed all laboratory work and imaging. Metabolic panel without any acute abnormality specifically kidney function within normal limits and no significant electrolyte abnormalities. CBC without leukocytosis or significant anemia. Troponin is negative, will obtain a delta due to her high risk of cardiac disease.  Imaging Studies:  CT head showed: 1. No acute intracranial abnormality.  2. Mild small vessel ischemic changes.   Xray of the chest without any acute fracture.   Cardiac Monitoring:  The patient was maintained on a cardiac monitor.  I personally viewed and interpreted the cardiac monitored which showed an underlying rhythm of: NSR EKG non-ischemic   Medicines ordered:  I ordered medication including reglan , benadryl , bolus  for symptomatic treatment Reevaluation of the patient after these medicines showed that the patient improved I have reviewed the patients home medicines and have made adjustments as needed  Reevaluation:  After the interventions noted above I re-evaluated patient and found that they have :improved  Social Determinants of Health:  The patient's social determinants of health were a factor in the care of this patient   Problem List / ED Course:  Patient presenting to the ED with a chief complaint of chest pain which has been ongoing for a few days, however worsened since yesterday after she found  out that her neighbor was Murder.  She does report a prior history of MI, does report a prior stent.  The pain does go to her left shoulder with some tingling noted.  She also had a headache for the last few days, does have a prior history of headaches along with some microperforations, tries not to take medication as her neurologist told her to stay away from this.  Blood work today remarkable for CBC with no leukocytosis, hemoglobin is within normal limits.  BMP with no electrolyte derangement, creatinine levels at her baseline. Troponin 4, will repeat delta.  I have a lower suspicion for ACS Patient reevaluated by me around 1109, does report improvement in her headache, is resting comfortably.  Remains normal sinus rhythm. CT head obtained due to her prior history of stroke, CT head without any acute findings today.  She reports no weakness, no changes in speech, no changes in vision, no changes in gait. No tachycardia, no hypoxia, not endorsing any shortness of breath, no prior history of blood clots, have a lower suspicion for pulmonary embolism at this time. Does note troponin is negative, she does report improvement in her symptoms at this time.  She did receive a headache cocktail with resolution of her migraine.  We discussed close follow-up with primary care physician.  She is hemodynamically stable for discharge.   Dispostion:  After consideration of the diagnostic results and the patients response to treatment, I feel that the patent would benefit from close follow-up with primary care physician.   Portions of this note were generated with Scientist, clinical (histocompatibility and immunogenetics). Dictation errors may occur despite best attempts at proofreading.      Final diagnoses:  Atypical chest pain    ED Discharge Orders     None          Maureen Broad, PA-C 12/28/23 2330    Ula Prentice SAUNDERS, MD 12/31/23 [phone]

## 2023-12-28 NOTE — ED Triage Notes (Signed)
 Pt arrived from home via [facility] c/o left sided chest pain sharp and radiating to left shoulder. Hx stroke and mi

## 2023-12-28 NOTE — Discharge Instructions (Signed)
 Your laboratory results are within normal limits today.  Please follow-up with your primary care physician as needed.  You may return to the emergency department if your symptoms worsen.

## 2024-01-23 ENCOUNTER — Inpatient Hospital Stay
Admission: RE | Admit: 2024-01-23 | Discharge: 2024-01-23 | Disposition: A | Discharge status: Home / Self Care | Payer: Self-pay | Source: Ambulatory Visit | Attending: Physician Assistant | Admitting: Physician Assistant

## 2024-01-23 ENCOUNTER — Other Ambulatory Visit: Payer: Self-pay | Admitting: *Deleted

## 2024-01-23 ENCOUNTER — Other Ambulatory Visit: Payer: Self-pay

## 2024-01-23 DIAGNOSIS — Z1231 Encounter for screening mammogram for malignant neoplasm of breast: Secondary | ICD-10-CM

## 2024-02-10 ENCOUNTER — Ambulatory Visit
Admission: RE | Admit: 2024-02-10 | Discharge: 2024-02-10 | Disposition: A | Discharge status: Home / Self Care | Source: Ambulatory Visit

## 2024-02-10 DIAGNOSIS — Z1231 Encounter for screening mammogram for malignant neoplasm of breast: Secondary | ICD-10-CM | POA: Insufficient documentation

## 2024-02-16 ENCOUNTER — Other Ambulatory Visit: Payer: Self-pay | Admitting: Student

## 2024-02-16 DIAGNOSIS — H532 Diplopia: Secondary | ICD-10-CM

## 2024-02-16 DIAGNOSIS — R519 Headache, unspecified: Secondary | ICD-10-CM

## 2024-02-16 DIAGNOSIS — R42 Dizziness and giddiness: Secondary | ICD-10-CM

## 2024-02-22 ENCOUNTER — Ambulatory Visit
Admission: RE | Admit: 2024-02-22 | Discharge: 2024-02-22 | Disposition: A | Discharge status: Home / Self Care | Source: Ambulatory Visit | Attending: Student | Admitting: Student

## 2024-02-22 DIAGNOSIS — R42 Dizziness and giddiness: Secondary | ICD-10-CM | POA: Diagnosis present

## 2024-02-22 DIAGNOSIS — R519 Headache, unspecified: Secondary | ICD-10-CM | POA: Insufficient documentation

## 2024-02-22 DIAGNOSIS — H532 Diplopia: Secondary | ICD-10-CM | POA: Diagnosis present

## 2024-03-05 ENCOUNTER — Other Ambulatory Visit: Payer: Self-pay

## 2024-03-05 DIAGNOSIS — R748 Abnormal levels of other serum enzymes: Secondary | ICD-10-CM

## 2024-03-08 ENCOUNTER — Ambulatory Visit
Admission: RE | Admit: 2024-03-08 | Discharge: 2024-03-08 | Disposition: A | Discharge status: Home / Self Care | Source: Ambulatory Visit

## 2024-03-08 DIAGNOSIS — R748 Abnormal levels of other serum enzymes: Secondary | ICD-10-CM | POA: Insufficient documentation

## 2024-04-09 ENCOUNTER — Encounter: Payer: Self-pay | Admitting: Radiology

## 2024-04-10 ENCOUNTER — Other Ambulatory Visit: Payer: Self-pay | Admitting: Orthopedic Surgery

## 2024-04-19 NOTE — Progress Notes (Signed)
 Subjective:     Patient ID: [name] Rowe is a 63 y.o.  On chart review, she has had these same complaints previously with full workup done by gastroenterology w/o identifiable cause.  CT abdomen 2023: IMPRESSION: 1. A specific cause for the patient's weight loss and blood in stool is not identified. 2.  Prominent stool throughout the colon favors constipation. 3. Mostly mild degrees of abdominal aortic and branch vessel atherosclerosis without compelling imaging findings supportive of chronic mesenteric ischemia. A right renal artery stent is present. 4. Small type 1 hiatal hernia.  EGD and colonoscopy in 2023 negative  Diarrhea  This is a recurrent problem. The current episode started 1 to 4 weeks ago (1 week). The problem occurs 2 to 4 times per day (after every meal). The problem has been unchanged. The stool consistency is described as Watery. The patient states that diarrhea does not awaken her from sleep. Associated symptoms include abdominal pain (LLQ), chills (initially) and weight loss (5 lbs in last week). Pertinent negatives include no arthralgias, bloating, coughing, fever, headaches, increased  flatus, myalgias, URI or vomiting. Nothing aggravates the symptoms. There are no known risk factors. She has tried anti-motility drug for the symptoms. The treatment provided mild relief. There is no history of inflammatory bowel disease, irritable bowel syndrome, malabsorption or a recent abdominal surgery.    The following portions of the patient's history were reviewed and updated as appropriate.  Past Medical History:  has a past medical history of Anxiety (2001), Arthritis, Black stool (2023), Chronic idiopathic constipation (2019), CKD (chronic kidney disease) stage 3, GFR 30-59 ml/min (CMS-HCC), Coronary artery disease, Coronary artery disease of native artery of native heart with stable angina pectoris, CVA, old, hemiparesis (CMS/HHS-HCC) (2021), Deafness, Depression (2001),  Diabetes mellitus without complication (CMS/HHS-HCC) (2025), Diarrhea (2023), Dyslipidemia, Essential hypertension, Fibromuscular dysplasia (), Fibromyalgia, GERD (gastroesophageal reflux disease), Heart disease, Hematologic abnormality (2009), Hematuria, microscopic (2023), HNP (herniated nucleus pulposus), lumbar (2014), Hyperlipidemia, Hypothyroidism (2011), Loss of consciousness (CMS/HHS-HCC) (2023), Lumbar radicular syndrome, Migraine without aura and without status migrainosus, not intractable (2023), Myocardial infarct (CMS/HHS-HCC) (2009), Myocardial infarction (CMS/HHS-HCC), Numbness and tingling (2023), Paresthesias (2022), Persistent proteinuria (2023), Prediabetes (2023), Psychophysiological insomnia (2020), Renal artery stenosis (2021), Seasonal asthma (HHS-HCC), Seizures (CMS/HHS-HCC) (2021), Snores, Spinal enthesopathy (2021), Stroke (CMS/HHS-HCC), Stroke (CMS/HHS-HCC), Thyroid  disease (2003), and Vitamin D  insufficiency (2014).  Problem List: has Anxiety disorder; Cerebrovascular disease; Chronic daily headache; Chronic kidney disease (CKD) stage G3b/A2, moderately decreased glomerular filtration rate (GFR) between 30-44 mL/min/1.73 square meter and albuminuria creatinine ratio between 30-299 mg/g (CMS-HCC); CAD (coronary artery disease)-Followed by Dr. Jordan; Coronary artery disease of native artery of native heart with stable angina pectoris ()-Followed by Dr. Jordan; Dyslipidemia, goal LDL below 70; Essential hypertension; Chronic low back pain-Followed by Sports medicine; Fibromyalgia; History of CVA (cerebrovascular accident); Hypothyroidism; Lumbar radiculopathy-Followed by Sports medicine; Migraine without aura and without status migrainosus, not intractable; Left hip pain-Followed by Orthopedics; Prediabetes; Unilateral hearing loss; Vitamin D  insufficiency; Persistent proteinuria; Lichen sclerosus; History of lumbar spinal fusion-Followed by Sports medicine; Depression; Obsessive  compulsive disorder; Gastroesophageal reflux disease without esophagitis; Benign hypertensive kidney disease with chronic kidney disease stage I through stage IV, or unspecified; CVA, old, hemiparesis (CMS/HHS-HCC); History of colonic polyps; History of coronary vasospasm; HNP (herniated nucleus pulposus), lumbar; Increased liver enzymes; Loss of consciousness (CMS/HHS-HCC); Muscle spasm; Pain of left breast; RUQ pain; Spinal enthesopathy of cervicothoracic region (); Spinal enthesopathy of lumbosacral region (); Tachycardia; Vestibular dizziness; Tenosynovitis of left thigh; At risk for  sleep apnea; Black stool; Blurry vision; Chronic idiopathic constipation; Diarrhea; Diplopia; Dizziness; Dyslipidemia; Extremity edema; Fatigue; Weakness; Fibromuscular dysplasia (); Migraine headache; Microscopic hematuria; Impaired functional mobility, balance, gait, and endurance; Multiple sclerosis with acute neurologic event; Numbness and tingling; Pre-op evaluation; Poorly-controlled hypertension; Poor sleep pattern; Psychophysiological insomnia; and Renal artery stenosis on their problem list.  Past Surgical History:  has a past surgical history that includes BACK SURGERY ; Bilateral hip arthroscopy (Bilateral); BUNIONECTOMY  (Bilateral); Colonoscopy; esophagogastroduodenoscopy, flexible, transoral for bariatric balloon adjustment; Hysterectomy Vaginal; Other Surgical History; Other Surgical History; Other Surgical History; Other Surgical History; Knee arthroscopy (2008); Spine surgery (2010); Coronary angioplasty (2021); Back surgery (2010 2011 2014); LUMBAR LAMINECTOMY/DECOMPRESSION MICRODISCECTOMY 1 LEVEL (05/25/2013); Left Heart Cath and Coronary Angiography (02/03/2015); and LEFT HEART CATHETERIZATION (07/31/2019).  Family History: family history includes Asthma in her mother; Coronary Artery Disease (Blocked arteries around heart) in her father, maternal aunt, maternal aunt, maternal aunt, maternal uncle, mother,  paternal aunt, paternal aunt, paternal uncle, and paternal uncle; Diabetes type II in her paternal aunt; Heart disease in her father, maternal aunt, maternal aunt, maternal aunt, maternal uncle, mother, paternal aunt, paternal aunt, paternal uncle, and paternal uncle; High blood pressure (Hypertension) in her brother, brother, father, mother, and sister; Hyperlipidemia (Elevated cholesterol) in her brother, mother, sister, and son; Kidney disease in her maternal aunt and paternal aunt; Osteoarthritis in her mother and sister; Prostate cancer in her brother and father; Stroke in her brother, father, and mother; Thyroid  disease in her maternal aunt.  Social History:  reports that she has never smoked. She has never been exposed to tobacco smoke. She has never used smokeless tobacco. She reports current alcohol use. She reports that she does not use drugs.  Current Medications: has a current medication list which includes the following prescription(s): amlodipine , aspirin , duloxetine , ezetimibe , hydroxyzine , levothyroxine , metoprolol  succinate, nitroglycerin , promethazine, rosuvastatin , topiramate, venlafaxine, albuterol , amoxicillin -clavulanate, ergocalciferol  (vitamin d2), gabapentin , emgality syringe, levocetirizine, linzess , oxycodone -acetaminophen , and pantoprazole .  Prior to encounter Medications:  Current Outpatient Medications on File Prior to Visit  Medication Sig Dispense Refill  . amLODIPine  (NORVASC ) 5 MG tablet     . aspirin  81 MG EC tablet Take 81 mg by mouth once daily    . DULoxetine  (CYMBALTA ) 30 MG DR capsule Take 1 capsule (30 mg total) by mouth once daily 30 capsule 11  . ezetimibe  (ZETIA ) 10 mg tablet     . hydrOXYzine  (ATARAX ) 25 MG tablet TAKE 1 TABLET(25 MG) BY MOUTH THREE TIMES DAILY AS NEEDED FOR ITCHING 30 tablet 1  . levothyroxine  (SYNTHROID ) 125 MCG tablet     . metoprolol  SUCCinate (TOPROL -XL) 100 MG XL tablet     . nitroGLYcerin  (NITROSTAT ) 0.4 MG SL tablet     .  promethazine (PHENERGAN) 25 MG tablet Take 25 mg by mouth every 6 (six) hours as needed for Nausea    . rosuvastatin  (CRESTOR ) 40 MG tablet Take 1 tablet (40 mg total) by mouth once daily 90 tablet 3  . topiramate (TOPAMAX) 50 MG tablet Take 150mg  nighly 270 tablet 1  . venlafaxine (EFFEXOR-XR) 75 MG XR capsule Take 75 mg by mouth once daily    . ALBUTEROL  INHAL Inhale 90 mcg into the lungs (Patient not taking: Reported on 04/17/2024)    . ergocalciferol , vitamin D2, 1,250 mcg (50,000 unit) capsule Take 50,000 Units by mouth once a week (Patient not taking: Reported on 04/17/2024)    . gabapentin  (NEURONTIN ) 100 MG capsule Take 100 mg by mouth 3 (three) times daily (  Patient not taking: Reported on 04/17/2024)    . galcanezumab-gnlm (EMGALITY SYRINGE) 120 mg/mL Syrg Inject 1 mL subcutaneously monthly (Patient not taking: Reported on 04/17/2024) 1 mL 5  . levocetirizine (XYZAL) 5 MG tablet Take 2.5 mg by mouth every evening (Patient not taking: Reported on 04/17/2024)    . LINZESS  290 mcg capsule Take 290 mcg by mouth as needed (Taking every 3 days or more often) (Patient not taking: Reported on 04/17/2024)    . oxyCODONE -acetaminophen  (PERCOCET) 5-325 mg tablet Take 1 tablet by mouth at bedtime (Patient not taking: Reported on 04/17/2024)    . pantoprazole  (PROTONIX ) 20 MG DR tablet Take 1 tablet (20 mg total) by mouth once daily (Patient not taking: Reported on 04/17/2024) 30 tablet 11   No current facility-administered medications on file prior to visit.    Allergies: is allergic to atorvastatin , compazine [prochlorperazine], dilaudid [hydromorphone], imitrex [sumatriptan], tramadol , and lisinopril.  Review of Systems  Constitutional:  Positive for chills (initially) and weight loss (5 lbs in last week). Negative for fever and malaise/fatigue.  Respiratory:  Negative for cough and shortness of breath.   Cardiovascular:  Negative for chest pain, palpitations and leg swelling.  Gastrointestinal:   Positive for abdominal pain (LLQ), diarrhea and nausea. Negative for bloating, blood in stool, constipation, flatus, heartburn and vomiting.  Genitourinary:  Negative for dysuria, flank pain, frequency, hematuria and urgency.  Musculoskeletal:  Negative for arthralgias and myalgias.  Skin:  Negative for rash.  Neurological:  Negative for dizziness and headaches.  Endo/Heme/Allergies:  Does not bruise/bleed easily.       Objective:   BP 120/77   Pulse 66   Temp 36.5 C (97.7 F) (Oral)   Ht 162.6 cm (5' 4)   Wt 59.7 kg (131 lb 9.6 oz)   SpO2 96%   BMI 22.59 kg/m    Physical Exam Vitals reviewed.  Constitutional:      Appearance: Normal appearance.  HENT:     Head: Normocephalic and atraumatic.  Cardiovascular:     Rate and Rhythm: Normal rate and regular rhythm.     Heart sounds: Normal heart sounds.  Pulmonary:     Effort: Pulmonary effort is normal.     Breath sounds: Normal breath sounds.  Abdominal:     General: Abdomen is flat. Bowel sounds are decreased.     Palpations: Abdomen is soft.     Tenderness: There is generalized abdominal tenderness. There is no guarding or rebound.  Skin:    General: Skin is warm and dry.  Neurological:     General: No focal deficit present.     Mental Status: She is alert and oriented to person, place, and time.  Psychiatric:        Mood and Affect: Mood normal.        Behavior: Behavior normal.     Results for orders placed or performed in visit on 04/19/24  CBC w/auto Differential (5 Part)  Result Value Ref Range   WBC (White Blood Cell Count) 6.3 4.1 - 10.2 10^3/uL   RBC (Red Blood Cell Count) 4.43 4.04 - 5.48 10^6/uL   Hemoglobin 13.4 12.0 - 15.0 gm/dL   Hematocrit 60.8 64.9 - 47.0 %   MCV (Mean Corpuscular Volume) 88.3 80.0 - 100.0 fl   MCH (Mean Corpuscular Hemoglobin) 30.2 27.0 - 31.2 pg   MCHC (Mean Corpuscular Hemoglobin Concentration) 34.3 32.0 - 36.0 gm/dL   Platelet Count 698 849 - 450 10^3/uL   RDW-CV (Red  Cell Distribution  Width) 12.3 11.6 - 14.8 %   MPV (Mean Platelet Volume) 9.1 (L) 9.4 - 12.4 fl   Neutrophils 4.49 1.50 - 7.80 10^3/uL   Lymphocytes 1.04 1.00 - 3.60 10^3/uL   Monocytes 0.43 0.00 - 1.50 10^3/uL   Eosinophils 0.27 0.00 - 0.55 10^3/uL   Basophils 0.06 0.00 - 0.09 10^3/uL   Neutrophil % 71.1 (H) 32.0 - 70.0 %   Lymphocyte % 16.5 10.0 - 50.0 %   Monocyte % 6.8 4.0 - 13.0 %   Eosinophil % 4.3 1.0 - 5.0 %   Basophil% 1.0 0.0 - 2.0 %   Immature Granulocyte % 0.3 <=0.7 %   Immature Granulocyte Count 0.02 <=0.06 10^3/L  Comprehensive Metabolic Panel (CMP)  Result Value Ref Range   Glucose 101 70 - 110 mg/dL   Sodium 860 863 - 854 mmol/L   Potassium 3.9 3.6 - 5.1 mmol/L   Chloride 110 (H) 97 - 109 mmol/L   Carbon Dioxide (CO2) 24.6 22.0 - 32.0 mmol/L   Urea Nitrogen (BUN) 10 7 - 25 mg/dL   Creatinine 1.0 0.6 - 1.1 mg/dL   Glomerular Filtration Rate (eGFR) 64 >60 mL/min/1.73sq m   Calcium  9.1 8.7 - 10.3 mg/dL   AST  37 8 - 39 U/L   ALT  66 (H) 5 - 38 U/L   Alk Phos (alkaline Phosphatase) 129 (H) 34 - 104 U/L   Albumin 4.3 3.5 - 4.8 g/dL   Bilirubin, Total 0.5 0.3 - 1.2 mg/dL   Protein, Total 6.9 6.1 - 7.9 g/dL   A/G Ratio 1.7 1.0 - 5.0 gm/dL  Urinalysis w/Microscopic  Result Value Ref Range   Color Yellow Colorless, Straw, Light Yellow, Yellow, Dark Yellow   Clarity Clear Clear   Specific Gravity 1.018 1.005 - 1.030   pH, Urine 5.5 5.0 - 8.0   Protein, Urinalysis Negative Negative mg/dL   Glucose, Urinalysis Negative Negative mg/dL   Ketones, Urinalysis Negative Negative mg/dL   Blood, Urinalysis Trace (!) Negative   Nitrite, Urinalysis Negative Negative   Leukocyte Esterase, Urinalysis 1+ (!) Negative   Bilirubin, Urinalysis Negative Negative   Urobilinogen, Urinalysis 0.2 0.2 - 1.0 mg/dL   Mucous, Urine PRESENT (!) None Seen   WBC, UA 3 <=5 /hpf   Red Blood Cells, Urinalysis 1 <=3 /hpf   Bacteria, Urinalysis 0-5 0 - 5 /hpf   Squamous Epithelial Cells,  Urinalysis 0 /hpf  Left shift. Elevated liver enzymes, being followed by PCP. No evidence dehydration.  ED Imaging Orders (From admission, onward)    Start     Status Ordering Provider   04/19/24 0000  X-ray abdomen 3 way series includes 1 view chest        Ordered EDWARDS, KELLY GARDNER  On my initial review, no evidence of bowel obstruction. Moderate amount of gas in small bowel with some beyond that. No stool burden noted.  >45 minutes of total time was spent face to face with the patient and greater than 50% was spent in counseling and/or coordination of care of the patient's problems as documented.     Assessment:     1. Diverticulitis -     amoxicillin -clavulanate (AUGMENTIN) 875-125 mg tablet; Take 1 tablet (875 mg total) by mouth 2 (two) times daily for 10 days  Dispense: 20 tablet; Refill: 0  2. Abdominal pain, LLQ (left lower quadrant) -     X-ray abdomen 3 way series includes 1 view chest  3. Diarrhea, unspecified type -  CBC w/auto Differential (5 Part) -     Comprehensive Metabolic Panel (CMP) -     Urinalysis w/Microscopic   Unable to Rx flagyl/cipro regimen d/t drug interactions with quinolones. Will switch to augmentin for monotherapy.    Plan:     Patient Instructions  You are being treated for diverticulitis and prescribed antibiotics. Please take as directed. Eat food as tolerated, stay hydrated. May take tylenol  as needed for pain.  If your symptoms worsen, you develop fever/chills, severe abdominal pain or are unable to tolerate liquids, go to the emergency department.

## 2024-04-23 ENCOUNTER — Emergency Department: Admission: EM | Admit: 2024-04-23 | Discharge: 2024-04-23 | Disposition: A | Discharge status: Home / Self Care

## 2024-04-23 ENCOUNTER — Emergency Department

## 2024-04-23 ENCOUNTER — Other Ambulatory Visit: Payer: Self-pay

## 2024-04-23 DIAGNOSIS — K529 Noninfective gastroenteritis and colitis, unspecified: Secondary | ICD-10-CM

## 2024-04-23 DIAGNOSIS — R197 Diarrhea, unspecified: Secondary | ICD-10-CM | POA: Diagnosis present

## 2024-04-23 DIAGNOSIS — R1032 Left lower quadrant pain: Secondary | ICD-10-CM | POA: Insufficient documentation

## 2024-04-23 DIAGNOSIS — R140 Abdominal distension (gaseous): Secondary | ICD-10-CM | POA: Insufficient documentation

## 2024-04-23 LAB — COMPREHENSIVE METABOLIC PANEL WITH GFR
ALT: 64 U/L — ABNORMAL HIGH (ref 0–44)
AST: 59 U/L — ABNORMAL HIGH (ref 15–41)
Albumin: 4.1 g/dL (ref 3.5–5.0)
Alkaline Phosphatase: 117 U/L (ref 38–126)
Anion gap: 10 (ref 5–15)
BUN: 11 mg/dL (ref 8–23)
CO2: 18 mmol/L — ABNORMAL LOW (ref 22–32)
Calcium: 9 mg/dL (ref 8.9–10.3)
Chloride: 107 mmol/L (ref 98–111)
Creatinine, Ser: 1.05 mg/dL — ABNORMAL HIGH (ref 0.44–1.00)
GFR, Estimated: 60 mL/min — ABNORMAL LOW (ref >60)
Glucose, Bld: 98 mg/dL (ref 70–99)
Potassium: 4.6 mmol/L (ref 3.5–5.1)
Sodium: 136 mmol/L (ref 135–145)
Total Bilirubin: 0.3 mg/dL (ref 0.0–1.2)
Total Protein: 6.6 g/dL (ref 6.5–8.1)

## 2024-04-23 LAB — CBC
HCT: 40.8 % (ref 36.0–46.0)
Hemoglobin: 13.4 g/dL (ref 12.0–15.0)
MCH: 29.8 pg (ref 26.0–34.0)
MCHC: 32.8 g/dL (ref 30.0–36.0)
MCV: 90.9 fL (ref 80.0–100.0)
Platelets: 290 K/uL (ref 150–400)
RBC: 4.49 MIL/uL (ref 3.87–5.11)
RDW: 12.7 % (ref 11.5–15.5)
WBC: 7.1 K/uL (ref 4.0–10.5)
nRBC: 0 % (ref 0.0–0.2)

## 2024-04-23 LAB — GASTROINTESTINAL PANEL BY PCR, STOOL (REPLACES STOOL CULTURE)

## 2024-04-23 LAB — LIPASE, BLOOD: Lipase: 40 U/L (ref 11–51)

## 2024-04-23 LAB — C DIFFICILE QUICK SCREEN W PCR REFLEX
C Diff antigen: NEGATIVE
C Diff interpretation: NOT DETECTED
C Diff toxin: NEGATIVE

## 2024-04-23 MED ORDER — IOHEXOL 300 MG/ML  SOLN
100.0000 mL | Freq: Once | INTRAMUSCULAR | Status: AC | PRN
  Administered 2024-04-23: 100 mL via INTRAVENOUS

## 2024-04-23 MED ORDER — AZITHROMYCIN 500 MG PO TABS: 500.0000 mg | ORAL_TABLET | Freq: Every day | ORAL | 0 refills | Status: AC

## 2024-04-23 MED ORDER — HYOSCYAMINE SULFATE 0.125 MG PO TABS: 0.1250 mg | ORAL_TABLET | ORAL | 0 refills | Status: DC | PRN

## 2024-04-23 NOTE — ED Notes (Signed)
 See triage note, pt reports diarrhea x 12 days.

## 2024-04-23 NOTE — ED Triage Notes (Signed)
 Pt arrives via POV with c/o diarrhea x12 days. Pt was seen here a few days ago and was prescribed an ABX which pt has taking as prescribed. Pt states that they aren't able to keep anything in their system, it all comes out within a few hours as diarrhea. Pt is A&Ox4 during triage.

## 2024-04-23 NOTE — ED Provider Notes (Signed)
 Emergency department handoff note  Care of this patient was signed out to me at the end of the previous provider shift.  All pertinent patient information was conveyed and all questions were answered.  Patient pending CT of the abdomen and pelvis with IV contrast showing pancolitis.  Patient's GI PCR and C. difficile negative.  Upon further questioning, patient states she does have family history of inflammatory bowel disease.  Will treat empirically for possible bacterial diarrhea with azithromycin  5 from milligrams every 3 days.  Patient also given strict return precautions and encouraged to follow-up with her primary care physician.  A referral was placed for [facility] and patient given contact information as well.  Patient agrees with plan for discharge at this time and all questions answered prior to discharge  Dispo: Discharge home with PCP and GI follow-up   Jossie Artist POUR, MD 04/23/24 1657

## 2024-04-23 NOTE — Discharge Instructions (Addendum)
 You were seen today due to concern of diarrhea.  Fortunately at this time your labs and imaging are reassuring.  I would recommend following up with the gastroenterology team, please call them to arrange for a follow-up appointment using the phone number provided.  If you have any worsening of symptoms such as increased abdominal pain, fevers, blood in your stool, or any other symptoms you find concerning please return to the emergency department immediately for further medical management.

## 2024-04-23 NOTE — ED Notes (Signed)
Pt ambulatory to toilet

## 2024-04-23 NOTE — ED Provider Notes (Signed)
 [facility] Provider Note    Event Date/Time   First MD Initiated Contact with Patient 04/23/24 1040     (approximate)   History   Diarrhea   HPI  [name] Rowe is a 63 y.o. female who presents today with concern of diarrhea.  Apparently has been dealing with ongoing diarrhea symptoms for about 2 weeks now, was seen by urgent care last week and started empirically on Augmentin.  She states no improvement since then, she has tried Imodium as well without much improvement.  Every time she tries to eat or drink it goes right through her and causes her to have a bowel movement.  Described as brown or clearish colored bowel movements.  No lightheadedness associated with the symptoms.  No fevers chills blood in the stool or any other symptoms.  I reviewed her recent urgent care note, it seems that she has had very GI workups for this that have had no clear diagnosis for her.  She tells me she has not had symptoms this bad before in the past.  No known sick contacts no recent travel.  Does endorse some abdominal bloating and pain primarily in the left lower quadrant of the abdomen.     Physical Exam   Triage Vital Signs: ED Triage Vitals  Encounter Vitals Group     BP 04/23/24 1031 128/85     Girls Systolic BP Percentile --      Girls Diastolic BP Percentile --      Boys Systolic BP Percentile --      Boys Diastolic BP Percentile --      Pulse Rate 04/23/24 1031 63     Resp 04/23/24 1031 17     Temp 04/23/24 1031 97.9 F (36.6 C)     Temp Source 04/23/24 1031 Oral     SpO2 04/23/24 1031 100 %     Weight 04/23/24 1035 129 lb (58.5 kg)     Height 04/23/24 1035 5' 4 (1.626 m)     Head Circumference --      Peak Flow --      Pain Score 04/23/24 1032 9     Pain Loc --      Pain Education --      Exclude from Growth Chart --     Most recent vital signs: Vitals:   04/23/24 1400 04/23/24 1430  BP: (!) 140/79 135/68  Pulse: 62 63  Resp: 15 17  Temp:  98  F (36.7 C)  SpO2: 100% 100%     General: Awake, no distress.  CV:  Good peripheral perfusion.  Resp:  Normal effort.  Abd:  No distention.  Minor discomfort to palpation along the left lower quadrant of the abdomen otherwise soft Other:     ED Results / Procedures / Treatments   Labs (all labs ordered are listed, but only abnormal results are displayed) Labs Reviewed  COMPREHENSIVE METABOLIC PANEL WITH GFR - Abnormal; Notable for the following components:      Result Value   CO2 18 (*)    Creatinine, Ser 1.05 (*)    AST 59 (*)    ALT 64 (*)    GFR, Estimated 60 (*)    All other components within normal limits  C DIFFICILE QUICK SCREEN W PCR REFLEX    GASTROINTESTINAL PANEL BY PCR, STOOL (REPLACES STOOL CULTURE)  LIPASE, BLOOD  CBC  URINALYSIS, ROUTINE W REFLEX MICROSCOPIC     EKG  Sinus rhythm with rate  of about 60, axis of about 45, intervals appear to be within normal limits, no obvious ischemia today This EKG   RADIOLOGY   PROCEDURES:  Critical Care performed: No  Procedures   MEDICATIONS ORDERED IN ED: Medications  iohexol  (OMNIPAQUE ) 300 MG/ML solution 100 mL (100 mLs Intravenous Contrast Given 04/23/24 1436)     IMPRESSION / MDM / ASSESSMENT AND PLAN / ED COURSE  I reviewed the triage vital signs and the nursing notes.                               Patient's presentation is most consistent with acute complicated illness / injury requiring diagnostic workup.  63 year old female who presents today with concern of diarrhea ongoing for about 2 weeks now, complaining of some associated abdominal bloating and some discomfort to palpation along the left lower quadrant of the abdomen.  She has been on a course of Augmentin for about 5 days now without much improvement in symptoms.  Overall suspect most likely gastroenteritis or possibly some GI motility issue.  She is nontoxic-appearing and has reassuring vitals here.  Will obtain CT imaging as well as a  stool sample given the duration of symptoms and her last CT from 2023.  This is reassuring plan for likely discharge home.      FINAL CLINICAL IMPRESSION(S) / ED DIAGNOSES   Final diagnoses:  Diarrhea, unspecified type     Rx / DC Orders   ED Discharge Orders     None        Note:  This document was prepared using Dragon voice recognition software and may include unintentional dictation errors.   Fernand Rossie HERO, MD 04/23/24 [phone]

## 2024-04-27 ENCOUNTER — Encounter
Admission: RE | Admit: 2024-04-27 | Discharge: 2024-04-27 | Disposition: A | Discharge status: Home / Self Care | Source: Ambulatory Visit | Attending: Orthopedic Surgery | Admitting: Orthopedic Surgery

## 2024-04-27 ENCOUNTER — Telehealth: Payer: Self-pay

## 2024-04-27 ENCOUNTER — Telehealth: Payer: Self-pay | Admitting: Urgent Care

## 2024-04-27 ENCOUNTER — Other Ambulatory Visit: Payer: Self-pay

## 2024-04-27 DIAGNOSIS — E876 Hypokalemia: Secondary | ICD-10-CM

## 2024-04-27 DIAGNOSIS — R197 Diarrhea, unspecified: Secondary | ICD-10-CM

## 2024-04-27 DIAGNOSIS — Z01812 Encounter for preprocedural laboratory examination: Secondary | ICD-10-CM | POA: Diagnosis present

## 2024-04-27 HISTORY — DX: Prediabetes: R73.03

## 2024-04-27 HISTORY — DX: Unspecified hearing loss, unspecified ear: H91.90

## 2024-04-27 HISTORY — DX: Atherosclerotic heart disease of native coronary artery with other forms of angina pectoris: I25.118

## 2024-04-27 HISTORY — DX: Multiple sclerosis, unspecified: G35.D

## 2024-04-27 LAB — URINALYSIS, COMPLETE (UACMP) WITH MICROSCOPIC
Bilirubin Urine: NEGATIVE
Glucose, UA: NEGATIVE mg/dL
Hgb urine dipstick: NEGATIVE
Ketones, ur: 5 mg/dL — AB
Leukocytes,Ua: NEGATIVE
Nitrite: NEGATIVE
Protein, ur: NEGATIVE mg/dL
Specific Gravity, Urine: 1.025 (ref 1.005–1.030)
pH: 5 (ref 5.0–8.0)

## 2024-04-27 LAB — SURGICAL PCR SCREEN
MRSA, PCR: NEGATIVE
Staphylococcus aureus: NEGATIVE

## 2024-04-27 LAB — MAGNESIUM: Magnesium: 2.2 mg/dL (ref 1.7–2.4)

## 2024-04-27 LAB — POTASSIUM: Potassium: 2.6 mmol/L — CL (ref 3.5–5.1)

## 2024-04-27 MED ORDER — POTASSIUM CHLORIDE CRYS ER 10 MEQ PO TBCR: EXTENDED_RELEASE_TABLET | ORAL | 0 refills | Status: DC

## 2024-04-27 NOTE — Progress Notes (Signed)
 [facility] Perioperative Services: Pre-Admission/Anesthesia Testing  Abnormal Lab Notification and Treatment Plan of Care   Date: 04/27/24  Name: [name] Rowe DOB: [birth year] MRN:   [id number]  Re: Abnormal labs noted during PAT appointment   Notified:  Provider Name Provider Role Notification Mode  [name] Hussar, MD Orthopedics (Surgeon) Routed and/or faxed via [facility] [name] Rowe, [name] Rowe Primary Care Provider Routed and/or faxed via [facility]   Clinical Information and Notes:  ABNORMAL LAB VALUE(S): Lab Results  Component Value Date   K 2.6 (LL) 04/27/2024   [name] Rowe is scheduled for an elective ARTHROPLASTY, HIP, TOTAL, ANTERIOR APPROACH on 05/10/2024. In review of her medication reconciliation, it is noted that the patient is NOT taking prescribed diuretic medications daily.   Please note, in efforts to promote a safe and effective anesthetic course, per current guidelines/standards set by the [facility] anesthesia team, the minimal acceptable K+ level for the patient to proceed with general anesthesia is 3.0 mmol/L. With that being said, if the patient drops any lower, her elective procedure will need to be postponed until K+ is better optimized. In efforts to prevent case cancellation, and ultimately to promote the safety of this patient undergoing sedation/anesthesia, will make efforts to optimize pre-surgical K+ level allowing the surgical intervention to proceed as planned.    Impression and Plan:  [name] Rowe found to be HYPOkalemic at 2.6 mmol/L on preoperative labs.   Mg level was added and found to be normal at: 2.2 mg/dL  She is on not diuretic therapy.  In review of her medical record, patient was seen in the ED on 04/23/2024 with diarrhea that she had been experiencing for 17 days. C. difficile testing and GI panel were negative for diarrhea of infectious etiology.  Patient was started on K+ supplement, however when speaking with the patient,  patient advising that she has not yet picked up the potassium supplement. Diarrhea resolving. Discussed GI related symptoms (+ diarrhea) as likely etiology. Patient denies regular use of laxative medications. Reviewed other potential causes, including decreased intake of dietary K+ resulting in increased insensible losses. Will change instructions on prescription as follows:  Based on an average of 0.12 mmol/L increase per 10 mEq oral KCl-, anticipate that this will optimize patient to the 4.2-4.5 mmol/L range -->  (0.48) + (1.2) = 1.68 (anticipated increase) + 2.6 (current serum K+). Rx sent to pharmacy of record.  Meds ordered this encounter  Medications   potassium chloride  (KLOR-CON  M) 10 MEQ tablet    Sig: Take 4 tablets (40 mEq) today, then 1 tablet (10 mEq) twice daily x 10 additional days.  Follow-up with PCP for repeat labs.    Dispense:  30 tablet    Refill:  0    Please contact the patient as soon as it is available for pickup. Rx is for preoperative K+ optimization and needs to be started ASAP. Disregard previously received KCl Rx from earlier this week.   Encouraged patient to follow up with PCP about 2-3 weeks postoperatively to have labs rechecked to ensure that levels are remaining within normal range. Discussed nutritional intake of K+ rich foods as an adjunctive way to keep her K+ levels normal; list of K+ rich foods provided. Also mentioned ORS, however advised her not to rely solely on these drinks, as they are high in Na+, and she has a HTN diagnosis.   Will send copy of this note to surgeon and PCP to make them aware of K+ level and  plans for correction. Discussed that PCP may elect to pursue a change in diuretic therapy to a K+ sparing type medication, or alternatively, they may consider adding a daily K+ supplement if levels remain low on recheck. Order entered to recheck K+ on the day of her surgery to ensure optimization. Wished patient the best of luck with her upcoming  surgery and subsequent recovery. She was encouraged to return call to the PAT clinic, or to her surgeon's office, should any questions or concerns arise between now and the time of her surgery. Patient was appreciative of the care/concern expressed by PAT staff.   Encounter Diagnoses  Name Primary?   Pre-operative laboratory examination Yes   Hypokalemia    Diarrhea    [name] Pereyra, MSN, APRN, FNP-C, CEN [facility]  Perioperative Services Nurse Practitioner Phone: [phone] 04/27/24 3:26 PM  NOTE: This note has been prepared using Dragon dictation software. Despite my best ability to proofread, there is always the potential that unintentional transcriptional errors may still occur from this process.

## 2024-04-27 NOTE — Telephone Encounter (Signed)
   Name: [name] Rowe  DOB: 01/07/1961  MRN: [id number]  Primary Cardiologist: Peter Jordan, MD   Preoperative team, please contact this patient and set up a phone call appointment ASAP as procedure is on 05/10/2024,  for further preoperative risk assessment. Please obtain consent and complete medication review. Thank you for your help.  I confirm that guidance regarding antiplatelet and oral anticoagulation therapy has been completed and, if necessary, noted below.  I also confirmed the patient resides in the state of [place] . As per [facility] telemedicine laws, the patient must reside in the state in which the provider is licensed.   Lamarr Satterfield, NP 04/27/2024, 2:51 PM [facility]

## 2024-04-27 NOTE — Telephone Encounter (Signed)
  Patient Consent for Virtual Visit        [name] Rowe has provided verbal consent on 04/27/2024 for a virtual visit (video or telephone).   CONSENT FOR VIRTUAL VISIT FOR:  [name] Rowe  By participating in this virtual visit I agree to the following:  I hereby voluntarily request, consent and authorize [facility] and its employed or contracted physicians, physician assistants, nurse practitioners or other licensed health care professionals (the Practitioner), to provide me with telemedicine health care services (the "Services) as deemed necessary by the treating Practitioner. I acknowledge and consent to receive the Services by the Practitioner via telemedicine. I understand that the telemedicine visit will involve communicating with the Practitioner through live audiovisual communication technology and the disclosure of certain medical information by electronic transmission. I acknowledge that I have been given the opportunity to request an in-person assessment or other available alternative prior to the telemedicine visit and am voluntarily participating in the telemedicine visit.  I understand that I have the right to withhold or withdraw my consent to the use of telemedicine in the course of my care at any time, without affecting my right to future care or treatment, and that the Practitioner or I may terminate the telemedicine visit at any time. I understand that I have the right to inspect all information obtained and/or recorded in the course of the telemedicine visit and may receive copies of available information for a reasonable fee.  I understand that some of the potential risks of receiving the Services via telemedicine include:  Delay or interruption in medical evaluation due to technological equipment failure or disruption; Information transmitted may not be sufficient (e.g. poor resolution of images) to allow for appropriate medical decision making by the  Practitioner; and/or  In rare instances, security protocols could fail, causing a breach of personal health information.  Furthermore, I acknowledge that it is my responsibility to provide information about my medical history, conditions and care that is complete and accurate to the best of my ability. I acknowledge that Practitioner's advice, recommendations, and/or decision may be based on factors not within their control, such as incomplete or inaccurate data provided by me or distortions of diagnostic images or specimens that may result from electronic transmissions. I understand that the practice of medicine is not an exact science and that Practitioner makes no warranties or guarantees regarding treatment outcomes. I acknowledge that a copy of this consent can be made available to me via my patient portal [facility] MyChart), or I can request a printed copy by calling the office of [facility].    I understand that my insurance will be billed for this visit.   I have read or had this consent read to me. I understand the contents of this consent, which adequately explains the benefits and risks of the Services being provided via telemedicine.  I have been provided ample opportunity to ask questions regarding this consent and the Services and have had my questions answered to my satisfaction. I give my informed consent for the services to be provided through the use of telemedicine in my medical care

## 2024-04-27 NOTE — Telephone Encounter (Signed)
 Preop tele appt now scheduled, med rec and consent done.

## 2024-04-27 NOTE — Patient Instructions (Addendum)
 Your procedure is scheduled on: Thursday 05/10/24 Report to the Registration Desk on the 1st floor of the [facility]. To find out your arrival time, please call [phone] between 1PM - 3PM on: Wednesday 05/09/24 If your arrival time is 6:00 am, do not arrive before that time as the [facility] entrance doors do not open until 6:00 am.  REMEMBER: Instructions that are not followed completely may result in serious medical risk, up to and including death; or upon the discretion of your surgeon and anesthesiologist your surgery may need to be rescheduled.  Do not eat food after midnight the night before surgery.  No gum chewing or hard candies.  You may however, drink CLEAR liquids up to 2 hours before you are scheduled to arrive for your surgery. Do not drink anything within 2 hours of your scheduled arrival time.  Clear liquids include: - water  - apple juice without pulp - black coffee or tea (Do NOT add milk or creamers to the coffee or tea) Do NOT drink anything that is not on this list.   In addition, your doctor has ordered for you to drink the provided:  Ensure Pre-Surgery Clear Carbohydrate Drink  Drinking this carbohydrate drink up to two hours before surgery helps to reduce insulin resistance and improve patient outcomes. Please complete drinking 2 hours before scheduled arrival time.  One week prior to surgery: Stop Anti-inflammatories (NSAIDS) such as Advil , Aleve , Ibuprofen , Motrin , Naproxen , Naprosyn  and Aspirin  based products such as Excedrin, Goody's Powder, BC Powder. Stop ANY OVER THE COUNTER supplements until after surgery.  You may however, continue to take Tylenol  if needed for pain up until the day of surgery.  Stop aspirin  EC 81 MG 5 days prior to surgery (take last dose Friday 05/04/24)  Continue taking all of your other prescription medications up until the day of surgery.  ON THE DAY OF SURGERY ONLY TAKE THESE MEDICATIONS WITH SIPS OF WATER:  amLODipine   (NORVASC ) 5 MG  DULoxetine  (CYMBALTA ) 30 MG  levothyroxine  (SYNTHROID ) 125 MCG  ezetimibe  (ZETIA ) 10 MG  metroNIDAZOLE (FLAGYL) 500 MG  ciprofloxacin (CIPRO) 250 MG potassium chloride  (KLOR-CON ) 10 MEQ    No Alcohol for 24 hours before or after surgery.  No Smoking including e-cigarettes for 24 hours before surgery.  No chewable tobacco products for at least 6 hours before surgery.  No nicotine patches on the day of surgery.  Do not use any recreational drugs for at least a week (preferably 2 weeks) before your surgery.  Please be advised that the combination of cocaine and anesthesia may have negative outcomes, up to and including death. If you test positive for cocaine, your surgery will be cancelled.  On the morning of surgery brush your teeth with toothpaste and water, you may rinse your mouth with mouthwash if you wish. Do not swallow any toothpaste or mouthwash.  Use CHG Soap or wipes as directed on instruction sheet.  Do not wear jewelry, make-up, hairpins, clips or nail polish.  For welded (permanent) jewelry: bracelets, anklets, waist bands, etc.  Please have this removed prior to surgery.  If it is not removed, there is a chance that hospital personnel will need to cut it off on the day of surgery.  Do not wear lotions, powders, or perfumes.   Do not shave body hair from the neck down 48 hours before surgery.  Contact lenses, hearing aids and dentures may not be worn into surgery.  Do not bring valuables to the hospital.  [facility] is not responsible for any missing/lost belongings or valuables.   Notify your doctor if there is any change in your medical condition (cold, fever, infection).  Wear comfortable clothing (specific to your surgery type) to the hospital.  After surgery, you can help prevent lung complications by doing breathing exercises.  Take deep breaths and cough every 1-2 hours. Your doctor may order a device called an Incentive Spirometer to help  you take deep breaths. When coughing or sneezing, hold a pillow firmly against your incision with both hands. This is called "splinting." Doing this helps protect your incision. It also decreases belly discomfort.  If you are being admitted to the hospital overnight, leave your suitcase in the car. After surgery it may be brought to your room.  In case of increased patient census, it may be necessary for you, the patient, to continue your postoperative care in the Same Day Surgery department.  If you are being discharged the day of surgery, you will not be allowed to drive home. You will need a responsible individual to drive you home and stay with you for 24 hours after surgery.   If you are taking public transportation, you will need to have a responsible individual with you.  Please call the Pre-admissions Testing Dept. at [phone] if you have any questions about these instructions.  Surgery Visitation Policy:  Patients having surgery or a procedure may have two visitors.  Children under the age of 80 must have an adult with them who is not the patient.  Inpatient Visitation:    Visiting hours are 7 a.m. to 8 p.m. Up to four visitors are allowed at one time in a patient room. The visitors may rotate out with other people during the day.  One visitor age 62 or older may stay with the patient overnight and must be in the room by 8 p.m.   Merchandiser, Retail to address health-related social needs:  [email or web address]    Pre-operative 4 CHG Bath Instructions   You can play a key role in reducing the risk of infection after surgery. Your skin needs to be as free of germs as possible. You can reduce the number of germs on your skin by washing with CHG (chlorhexidine gluconate) soap before surgery. CHG is an antiseptic soap that kills germs and continues to kill germs even after washing.   DO NOT use if you have an allergy to chlorhexidine/CHG or  antibacterial soaps. If your skin becomes reddened or irritated, stop using the CHG and notify one of our RNs at [phone].   Please shower with the CHG soap starting 4 days before surgery using the following schedule:     Please keep in mind the following:  DO NOT shave, including legs and underarms, starting the day of your first shower.   You may shave your face at any point before/day of surgery.  Place clean sheets on your bed the day you start using CHG soap. Use a clean washcloth (not used since being washed) for each shower. DO NOT sleep with pets once you start using the CHG.   CHG Shower Instructions:  If you choose to wash your hair and private area, wash first with your normal shampoo/soap.  After you use shampoo/soap, rinse your hair and body thoroughly to remove shampoo/soap residue.  Turn the water OFF and apply about 3 tablespoons (45 ml) of CHG soap to a CLEAN washcloth.  Apply CHG soap ONLY FROM YOUR NECK DOWN TO  YOUR TOES (washing for 3-5 minutes)  DO NOT use CHG soap on face, private areas, open wounds, or sores.  Pay special attention to the area where your surgery is being performed.  If you are having back surgery, having someone wash your back for you may be helpful. Wait 2 minutes after CHG soap is applied, then you may rinse off the CHG soap.  Pat dry with a clean towel  Put on clean clothes/pajamas   If you choose to wear lotion, please use ONLY the CHG-compatible lotions on the back of this paper.     Additional instructions for the day of surgery: DO NOT APPLY any lotions, deodorants, cologne, or perfumes.   Put on clean/comfortable clothes.  Brush your teeth.  Ask your nurse before applying any prescription medications to the skin.      CHG Compatible Lotions   Aveeno Moisturizing lotion  Cetaphil Moisturizing Cream  Cetaphil Moisturizing Lotion  Clairol Herbal Essence Moisturizing Lotion, Dry Skin  Clairol Herbal Essence Moisturizing Lotion,  Extra Dry Skin  Clairol Herbal Essence Moisturizing Lotion, Normal Skin  Curel Age Defying Therapeutic Moisturizing Lotion with Alpha Hydroxy  Curel Extreme Care Body Lotion  Curel Soothing Hands Moisturizing Hand Lotion  Curel Therapeutic Moisturizing Cream, Fragrance-Free  Curel Therapeutic Moisturizing Lotion, Fragrance-Free  Curel Therapeutic Moisturizing Lotion, Original Formula  Eucerin Daily Replenishing Lotion  Eucerin Dry Skin Therapy Plus Alpha Hydroxy Crme  Eucerin Dry Skin Therapy Plus Alpha Hydroxy Lotion  Eucerin Original Crme  Eucerin Original Lotion  Eucerin Plus Crme Eucerin Plus Lotion  Eucerin TriLipid Replenishing Lotion  Keri Anti-Bacterial Hand Lotion  Keri Deep Conditioning Original Lotion Dry Skin Formula Softly Scented  Keri Deep Conditioning Original Lotion, Fragrance Free Sensitive Skin Formula  Keri Lotion Fast Absorbing Fragrance Free Sensitive Skin Formula  Keri Lotion Fast Absorbing Softly Scented Dry Skin Formula  Keri Original Lotion  Keri Skin Renewal Lotion Keri Silky Smooth Lotion  Keri Silky Smooth Sensitive Skin Lotion  Nivea Body Creamy Conditioning Oil  Nivea Body Extra Enriched Lotion  Nivea Body Original Lotion  Nivea Body Sheer Moisturizing Lotion Nivea Crme  Nivea Skin Firming Lotion  NutraDerm 30 Skin Lotion  NutraDerm Skin Lotion  NutraDerm Therapeutic Skin Cream  NutraDerm Therapeutic Skin Lotion  ProShield Protective Hand Cream  Provon moisturizing lotion  How to Use an Incentive Spirometer  An incentive spirometer is a tool that measures how well you are filling your lungs with each breath. Learning to take long, deep breaths using this tool can help you keep your lungs clear and active. This may help to reverse or lessen your chance of developing breathing (pulmonary) problems, especially infection. You may be asked to use a spirometer: After a surgery. If you have a lung problem or a history of smoking. After a long period  of time when you have been unable to move or be active. If the spirometer includes an indicator to show the highest number that you have reached, your health care provider or respiratory therapist will help you set a goal. Keep a log of your progress as told by your health care provider. What are the risks? Breathing too quickly may cause dizziness or cause you to pass out. Take your time so you do not get dizzy or light-headed. If you are in pain, you may need to take pain medicine before doing incentive spirometry. It is harder to take a deep breath if you are having pain. How to use your incentive spirometer  Sit up on the edge of your bed or on a chair. Hold the incentive spirometer so that it is in an upright position. Before you use the spirometer, breathe out normally. Place the mouthpiece in your mouth. Make sure your lips are closed tightly around it. Breathe in slowly and as deeply as you can through your mouth, causing the piston or the ball to rise toward the top of the chamber. Hold your breath for 3-5 seconds, or for as long as possible. If the spirometer includes a coach indicator, use this to guide you in breathing. Slow down your breathing if the indicator goes above the marked areas. Remove the mouthpiece from your mouth and breathe out normally. The piston or ball will return to the bottom of the chamber. Rest for a few seconds, then repeat the steps 10 or more times. Take your time and take a few normal breaths between deep breaths so that you do not get dizzy or light-headed. Do this every 1-2 hours when you are awake. If the spirometer includes a goal marker to show the highest number you have reached (best effort), use this as a goal to work toward during each repetition. After each set of 10 deep breaths, cough a few times. This will help to make sure that your lungs are clear. If you have an incision on your chest or abdomen from surgery, place a pillow or a rolled-up  towel firmly against the incision when you cough. This can help to reduce pain while taking deep breaths and coughing. General tips When you are able to get out of bed: Walk around often. Continue to take deep breaths and cough in order to clear your lungs. Keep using the incentive spirometer until your health care provider says it is okay to stop using it. If you have been in the hospital, you may be told to keep using the spirometer at home. Contact a health care provider if: You are having difficulty using the spirometer. You have trouble using the spirometer as often as instructed. Your pain medicine is not giving enough relief for you to use the spirometer as told. You have a fever. Get help right away if: You develop shortness of breath. You develop a cough with bloody mucus from the lungs. You have fluid or blood coming from an incision site after you cough. Summary An incentive spirometer is a tool that can help you learn to take long, deep breaths to keep your lungs clear and active. You may be asked to use a spirometer after a surgery, if you have a lung problem or a history of smoking, or if you have been inactive for a long period of time. Use your incentive spirometer as instructed every 1-2 hours while you are awake. If you have an incision on your chest or abdomen, place a pillow or a rolled-up towel firmly against your incision when you cough. This will help to reduce pain. Get help right away if you have shortness of breath, you cough up bloody mucus, or blood comes from your incision when you cough. This information is not intended to replace advice given to you by your health care provider. Make sure you discuss any questions you have with your health care provider.    Please go to the following website to access important education materials concerning your upcoming joint replacement.  [email or web address]

## 2024-04-27 NOTE — Telephone Encounter (Signed)
-----   Message from Dorise CHARLENA Pereyra sent at 04/27/2024  2:38 PM EST ----- Regarding: Request for pre-operative cardiac clearance Request for pre-operative cardiac clearance:  1. What type of surgery is being performed?  ARTHROPLASTY, HIP, TOTAL, ANTERIOR APPROACH  2. When is this surgery scheduled?  05/10/2024  3. Type of clearance being requested (medical, pharmacy, both)? MEDICAL   4. Are there any medications that need to be held prior to surgery? ASA  5. Practice name and name of physician performing surgery?  Performing surgeon: Dr. Arthea Sheer, MD Requesting clearance: [name], FNP-C    6. Anesthesia type (none, local, MAC, general)? GENERAL  7. What is the office phone and fax number?   Phone: [phone] Fax: Return FAX not required. I will follow up in [facility].  ATTENTION: Unable to create telephone message as per your standard workflow. Directed by [facility] providers to send requests for cardiac clearance to this pool for appropriate distribution to provider covering pre-operative clearances.   [name], MSN, APRN, FNP-C, CEN [facility]  Peri-operative Services Nurse Practitioner Phone: [phone] 04/27/24 2:38 PM

## 2024-04-27 NOTE — Telephone Encounter (Signed)
   Pre-operative Risk Assessment    Patient Name: [name] Rowe  DOB: 07-27-1960 MRN: [id number]   Date of last office visit: 09/14/23 Peter Jordan, MD Date of next office visit: NONE   Request for Surgical Clearance    Procedure:  ARTHROPLASTY, HIP, TOTAL, ANTERIOR APPROACH  Date of Surgery:  Clearance 05/10/24                                Surgeon:  Arthea Sheer, MD  Surgeon's Group or Practice Name:  [facility]  Phone number:  [phone]  Fax number:  Return FAX not required. I will follow up in [facility].    Type of Clearance Requested:   - Medical  - Pharmacy:  Hold Aspirin      Type of Anesthesia:  General    Additional requests/questions:    Signed, Lucie DELENA Ku   04/27/2024, 2:46 PM    Elnor Dorise BRAVO, NP  P Cv Div Preop Callback Request for pre-operative cardiac clearance:   1. What type of surgery is being performed? ARTHROPLASTY, HIP, TOTAL, ANTERIOR APPROACH  2. When is this surgery scheduled? 05/10/2024   3. Type of clearance being requested (medical, pharmacy, both)? MEDICAL   4. Are there any medications that need to be held prior to surgery? ASA  5. Practice name and name of physician performing surgery? Performing surgeon: Dr. Arthea Sheer, MD Requesting clearance: Dorise Elnor, FNP-C     6. Anesthesia type (none, local, MAC, general)? GENERAL  7. What is the office phone and fax number?   Phone: [phone] Fax: Return FAX not required. I will follow up in [facility].  ATTENTION: Unable to create telephone message as per your standard workflow. Directed by [facility] providers to send requests for cardiac clearance to this pool for appropriate distribution to provider covering pre-operative clearances.  Dorise Elnor, MSN, APRN, FNP-C, CEN [facility] Peri-operative Services Nurse Practitioner Phone: [phone] 04/27/24 2:38 PM

## 2024-04-28 LAB — TYPE AND SCREEN
ABO/RH(D): B POS
Antibody Screen: NEGATIVE

## 2024-04-30 ENCOUNTER — Ambulatory Visit: Attending: Cardiology | Admitting: Cardiology

## 2024-04-30 DIAGNOSIS — Z01810 Encounter for preprocedural cardiovascular examination: Secondary | ICD-10-CM

## 2024-04-30 DIAGNOSIS — Z01818 Encounter for other preprocedural examination: Secondary | ICD-10-CM

## 2024-04-30 NOTE — Progress Notes (Signed)
 Virtual Visit via Telephone Note   Because of [name] Rowe co-morbid illnesses, she is at least at moderate risk for complications without adequate follow up.  This format is felt to be most appropriate for this patient at this time.  Due to technical limitations with video connection (technology), today's appointment will be conducted as an audio only telehealth visit, and Jacquita Leblond verbally agreed to proceed in this manner.   All issues noted in this document were discussed and addressed.  No physical exam could be performed with this format.  Evaluation Performed:  Preoperative cardiovascular risk assessment _____________   Date:  04/30/2024   Patient ID:  Chuck Grass, DOB 1960-07-24, MRN [id number] Patient Location:  Home Provider location:   Office  Primary Care Provider:  Roxanna Rocks, [place] Primary Cardiologist:  Peter Jordan, MD  Chief Complaint / Patient Profile   63 y.o. y/o female with a h/o CAD s/p DES to mid LAD in 2021, history of stroke following an epidural injection, hypertension, dyslipidemia, fibromuscular dysplasia s/p stent in 2009 for renal artery stenosis who is pending ARTHROPLASTY, HIP, TOTAL, ANTERIOR APPROACH  and presents today for telephonic preoperative cardiovascular risk assessment.  History of Present Illness    Siren Liberati is a 64 y.o. female who presents via audio/video conferencing for a telehealth visit today.  Pt was last seen in cardiology clinic on 09/14/2023 by Dr. Jordan.  At that time Tynia Salamone was doing well from a cardiac perspective, was planning to start a physical therapy program, was started on a low-dose Imdur  for coronary vasospasms but otherwise no further changes and she was advised she could follow-up in a year.  The patient is now pending procedure as outlined above. Since her last visit, she has been doing well, she walks at a park for exercise approximately 2 hours most days of the week. She is optimized from a  cardiac perspective and requires no further testing prior to her surgery. She denies chest pain, palpitations, dyspnea, pnd, orthopnea, n, v, dizziness, syncope, edema, weight gain, or early satiety.    Past Medical History    Past Medical History:  Diagnosis Date   Adenomyosis    Anginal pain    Anxiety 2001   Chest pain    a. No angiographic CAD by cath 2009 in [place]. There was catheter-induced RCA vasospasm versus microvascular obstruction. b. Lexiscan  myoview  04/2010 - EF 79%, normal perfusion.;  c. ETT-Myoview  7/13: no ischemia, EF 77%, submax exercise   Chronic back pain    had 3 epidural injections on 02-14-13   Chronic kidney disease    Complication of anesthesia    has difficulty voiding after surgery   Coronary artery disease of native artery of native heart with stable angina pectoris    Coronary vasospasm    CVA (cerebral vascular accident) (HCC) 2006   lost hearing on left and 2021   Depression 2001   Dysrhythmia    Fibromuscular dysplasia 2009   a. Renal artery stenosis due to this. b. Carotid dopplers 07/2011 c/w FMD but no significant stenosis   Fibromyalgia 1999   H/O Graves' disease 2003   s/p radio-iodine treatment in 2003    Hearing loss of left ear    s/p CVA 2006   History of stent insertion of renal artery 2009   right renal stent   HLD (hyperlipidemia) 2000   HTN (hypertension) 1988   a. Likely related to RAS; urinary metanephrines and plasma renin/aldosterone ratio normal. b. H/o  HTN urgency 06/2011 after being off BP meds for a number of days   Hx of echocardiogram    a. Echo (1/13): EF 55-60%, grade 1 diastolic dysfunction   Hypertensive crisis 06/29/2011   Hypothyroidism 2003   Graves disease s/p radio-iodine treatment in 2003    Irritable bowel syndrome    Leiomyoma    Migraine headache    Multiple sclerosis with acute neurologic event    Myocardial infarction [facility]) 2009   Personal history of colonic polyps-sessile serrated adenoma  02/23/2013   Pre-diabetes    Renal artery stenosis    a. Due to fibromuscular dysplasia. b. Renal stent 2009. c. Studies:  CTA abdomen (1/13) with evidence for fibromuscular dysplasia, right renal artery stent appears patent. Renal artery dopplers (2/13) with FMD but no evidence for signficant stenosis.    Tachycardia 2000   10/27/10-Holter -NSR with occ sinus tachy-rare PACs, no sig arrhythmia   Unilateral hearing loss    Past Surgical History:  Procedure Laterality Date   BACK SURGERY  2010 and 2011   In CT.  11/19/2008 (possibly a laminectomy) and in May of 2011 she underwent a L2-L3 fusion.   bunion removed  Bilateral    cardiac cath     CARDIAC CATHETERIZATION  2009   states it was normal   CARDIAC CATHETERIZATION N/A 02/03/2015   Procedure: Left Heart Cath and Coronary Angiography;  Surgeon: Ezra GORMAN Shuck, MD;  Location: [facility] INVASIVE CV LAB;  Service: Cardiovascular;  Laterality: N/A;   COLONOSCOPY  last 02/16/2013   COLONOSCOPY     HIP SURGERY Bilateral 1988 and 1989   scraped head of femur   KNEE ARTHROSCOPY Left    LUMBAR LAMINECTOMY/DECOMPRESSION MICRODISCECTOMY Left 05/25/2013   Procedure: LUMBAR LAMINECTOMY/DECOMPRESSION MICRODISCECTOMY 1 LEVEL;  Surgeon: Rockey LITTIE Peru, MD;  Location: [facility] NEURO ORS;  Service: Neurosurgery;  Laterality: Left;  LEFT L5S1 microdiskectomy   POLYPECTOMY     RENAL ARTERY STENT  2009   ROBOTIC ASSISTED LAP VAGINAL HYSTERECTOMY  05/13/2008   [facility] da Vinci hysterectomy  for myomatous uterus    SPINE SURGERY     VAGINAL HYSTERECTOMY      Allergies  Allergies  Allergen Reactions   Hydromorphone Hcl Anxiety, Other (See Comments), Shortness Of Breath and Swelling    Palpitations    Palpitations Palpitations  Palpitations    Palpitations Palpitations   Prochlorperazine Edisylate Other (See Comments)    Causes Seizures    Sumatriptan Palpitations   Atorvastatin  Other (See Comments)    Joint pain   Lisinopril Cough   Tramadol  Anxiety     Home Medications    Prior to Admission medications   Medication Sig Start Date End Date Taking? Authorizing Provider  amLODipine  (NORVASC ) 5 MG tablet Take 1 tablet (5 mg total) by mouth daily. 09/14/23   Jordan, Peter M, MD  aspirin  EC 81 MG tablet Take 81 mg by mouth daily.    [provider]  ciprofloxacin (CIPRO) 250 MG tablet Take 250 mg by mouth 2 (two) times daily.    [provider]  Cyanocobalamin (VITAMIN B-12 CR PO) Take by mouth.    [provider]  DULoxetine  (CYMBALTA ) 30 MG capsule Take 30 mg by mouth daily.    [provider]  ezetimibe  (ZETIA ) 10 MG tablet Take 1 tablet (10 mg total) by mouth daily. 09/14/23 04/27/24  Jordan, Peter M, MD  hydrOXYzine  (ATARAX ) 25 MG tablet Take 25 mg by mouth 3 (three) times daily as needed for  itching. 04/18/24   [provider]  hyoscyamine  (LEVSIN ) 0.125 MG tablet Take 1 tablet (0.125 mg total) by mouth every 4 (four) hours as needed (diarrhea). 04/23/24   Bradler, Evan K, MD  levothyroxine  (SYNTHROID ) 125 MCG tablet Take 125 mcg by mouth daily before breakfast. 02/10/23   [provider]  LINZESS  290 MCG CAPS capsule TAKE 1 CAPSULE BY MOUTH DAILY 02/02/19   Francesco Alan BROCKS, MD  metoprolol  succinate (TOPROL -XL) 100 MG 24 hr tablet Take 1 tablet (100 mg total) by mouth daily. Take with or immediately following a meal. 09/14/23   Jordan, Peter M, MD  metroNIDAZOLE (FLAGYL) 500 MG tablet Take 500 mg by mouth 3 (three) times daily.    [provider]  nitroGLYCERIN  (NITROSTAT ) 0.4 MG SL tablet Place 1 tablet (0.4 mg total) under the tongue every 5 (five) minutes as needed. x3 doses as needed for chest pain 09/14/23   Jordan, Peter M, MD  potassium chloride  (KLOR-CON  M) 10 MEQ tablet Take 4 tablets (40 mEq) today, then 1 tablet (10 mEq) twice daily x 10 additional days.  Follow-up with PCP for repeat labs. 04/27/24   Gray, Bryan E, NP  potassium chloride  (KLOR-CON ) 10 MEQ tablet Take 10 mEq  by mouth in the morning and at bedtime.    [provider]  rosuvastatin  (CRESTOR ) 40 MG tablet Take 1 tablet (40 mg total) by mouth daily. 09/14/23   Jordan, Peter M, MD  topiramate  (TOPAMAX ) 100 MG tablet Take 100 mg by mouth at bedtime. 03/03/22   [provider]  topiramate  (TOPAMAX ) 50 MG tablet Take 50 mg by mouth at bedtime. 04/17/24   [provider]    Physical Exam    Vital Signs:  Alfrieda Gesell does not have vital signs available for review today.  Given telephonic nature of communication, physical exam is limited. AAOx3. NAD. Normal affect.  Speech and respirations are unlabored.  Accessory Clinical Findings    None  Assessment & Plan    1.  Preoperative Cardiovascular Risk Assessment:     Ms. Mozer perioperative risk of a major cardiac event is 6.6% according to the Revised Cardiac Risk Index (RCRI).  Therefore, she is at high risk for perioperative complications.   Her functional capacity is good at 6.05 METs according to the Duke Activity Status Index (DASI). Recommendations: According to ACC/AHA guidelines, no further cardiovascular testing needed.  The patient may proceed to surgery at acceptable risk.   Antiplatelet and/or Anticoagulation Recommendations: Aspirin  can be held for 5-7  days prior to her surgery.  Please resume Aspirin  post operatively when it is felt to be safe from a bleeding standpoint.      The patient was advised that if she develops new symptoms prior to surgery to contact our office to arrange for a follow-up visit, and she verbalized understanding.    A copy of this note will be routed to requesting surgeon.  Time:   Today, I have spent 10 minutes with the patient with telehealth technology discussing medical history, symptoms, and management plan.     Delon BROCKS Hoover, NP  04/30/2024, 12:02 PM

## 2024-05-02 ENCOUNTER — Encounter: Payer: Self-pay | Admitting: Orthopedic Surgery

## 2024-05-09 MED ORDER — CEFAZOLIN SODIUM-DEXTROSE 2-4 GM/100ML-% IV SOLN
2.0000 g | INTRAVENOUS | Status: AC
  Administered 2024-05-10: 2 g via INTRAVENOUS

## 2024-05-09 MED ORDER — TRANEXAMIC ACID-NACL 1000-0.7 MG/100ML-% IV SOLN
1000.0000 mg | INTRAVENOUS | Status: AC
  Administered 2024-05-10 (×2): 1000 mg via INTRAVENOUS

## 2024-05-09 MED ORDER — DEXAMETHASONE SOD PHOSPHATE PF 10 MG/ML IJ SOLN
8.0000 mg | Freq: Once | INTRAMUSCULAR | Status: AC
  Administered 2024-05-10: 8 mg via INTRAVENOUS

## 2024-05-10 ENCOUNTER — Ambulatory Visit
Admission: RE | Admit: 2024-05-10 | Discharge: 2024-05-11 | Disposition: A | Source: Ambulatory Visit | Attending: Orthopedic Surgery | Admitting: Orthopedic Surgery

## 2024-05-10 ENCOUNTER — Other Ambulatory Visit: Payer: Self-pay

## 2024-05-10 ENCOUNTER — Ambulatory Visit: Payer: Self-pay | Admitting: Urgent Care

## 2024-05-10 ENCOUNTER — Ambulatory Visit

## 2024-05-10 ENCOUNTER — Encounter: Admission: RE | Disposition: A | Payer: Self-pay | Source: Ambulatory Visit | Attending: Orthopedic Surgery

## 2024-05-10 ENCOUNTER — Encounter: Payer: Self-pay | Admitting: Orthopedic Surgery

## 2024-05-10 DIAGNOSIS — E1122 Type 2 diabetes mellitus with diabetic chronic kidney disease: Secondary | ICD-10-CM | POA: Insufficient documentation

## 2024-05-10 DIAGNOSIS — S73101A Unspecified sprain of right hip, initial encounter: Secondary | ICD-10-CM | POA: Insufficient documentation

## 2024-05-10 DIAGNOSIS — E1151 Type 2 diabetes mellitus with diabetic peripheral angiopathy without gangrene: Secondary | ICD-10-CM | POA: Insufficient documentation

## 2024-05-10 DIAGNOSIS — N183 Chronic kidney disease, stage 3 unspecified: Secondary | ICD-10-CM | POA: Insufficient documentation

## 2024-05-10 DIAGNOSIS — M1611 Unilateral primary osteoarthritis, right hip: Secondary | ICD-10-CM | POA: Insufficient documentation

## 2024-05-10 DIAGNOSIS — I25119 Atherosclerotic heart disease of native coronary artery with unspecified angina pectoris: Secondary | ICD-10-CM | POA: Insufficient documentation

## 2024-05-10 DIAGNOSIS — I1310 Hypertensive heart and chronic kidney disease without heart failure, with stage 1 through stage 4 chronic kidney disease, or unspecified chronic kidney disease: Secondary | ICD-10-CM | POA: Insufficient documentation

## 2024-05-10 DIAGNOSIS — Z955 Presence of coronary angioplasty implant and graft: Secondary | ICD-10-CM | POA: Insufficient documentation

## 2024-05-10 DIAGNOSIS — R5383 Other fatigue: Secondary | ICD-10-CM

## 2024-05-10 DIAGNOSIS — M25851 Other specified joint disorders, right hip: Secondary | ICD-10-CM | POA: Insufficient documentation

## 2024-05-10 DIAGNOSIS — I252 Old myocardial infarction: Secondary | ICD-10-CM | POA: Insufficient documentation

## 2024-05-10 DIAGNOSIS — R197 Diarrhea, unspecified: Secondary | ICD-10-CM

## 2024-05-10 DIAGNOSIS — Z96641 Presence of right artificial hip joint: Secondary | ICD-10-CM | POA: Diagnosis present

## 2024-05-10 DIAGNOSIS — I519 Heart disease, unspecified: Secondary | ICD-10-CM | POA: Insufficient documentation

## 2024-05-10 DIAGNOSIS — Z01812 Encounter for preprocedural laboratory examination: Secondary | ICD-10-CM

## 2024-05-10 DIAGNOSIS — X58XXXA Exposure to other specified factors, initial encounter: Secondary | ICD-10-CM | POA: Insufficient documentation

## 2024-05-10 DIAGNOSIS — E876 Hypokalemia: Secondary | ICD-10-CM

## 2024-05-10 DIAGNOSIS — E039 Hypothyroidism, unspecified: Secondary | ICD-10-CM | POA: Insufficient documentation

## 2024-05-10 HISTORY — PX: TOTAL HIP ARTHROPLASTY: SHX124

## 2024-05-10 HISTORY — DX: Long term (current) use of aspirin: Z79.82

## 2024-05-10 HISTORY — DX: Other ill-defined heart diseases: I51.89

## 2024-05-10 LAB — POCT I-STAT, CHEM 8
BUN: 14 mg/dL (ref 8–23)
Calcium, Ion: 1.18 mmol/L (ref 1.15–1.40)
Chloride: 107 mmol/L (ref 98–111)
Creatinine, Ser: 1 mg/dL (ref 0.44–1.00)
Glucose, Bld: 86 mg/dL (ref 70–99)
HCT: 40 % (ref 36.0–46.0)
Hemoglobin: 13.6 g/dL (ref 12.0–15.0)
Potassium: 4.3 mmol/L (ref 3.5–5.1)
Sodium: 140 mmol/L (ref 135–145)
TCO2: 20 mmol/L — ABNORMAL LOW (ref 22–32)

## 2024-05-10 LAB — ABO/RH: ABO/RH(D): B POS

## 2024-05-10 SURGERY — ARTHROPLASTY, HIP, TOTAL, ANTERIOR APPROACH
Anesthesia: General | Site: Hip | Laterality: Right

## 2024-05-10 MED ORDER — OXYCODONE HCL 5 MG/5ML PO SOLN: 5.0000 mg | Freq: Once | ORAL | Status: AC | PRN

## 2024-05-10 MED ORDER — ONDANSETRON HCL 4 MG PO TABS
4.0000 mg | ORAL_TABLET | Freq: Four times a day (QID) | ORAL | 0 refills | Status: AC | PRN
  Filled 2024-05-10: qty 20, 5d supply, fill #0

## 2024-05-10 MED ORDER — ACETAMINOPHEN 500 MG PO TABS
1000.0000 mg | ORAL_TABLET | Freq: Three times a day (TID) | ORAL | Status: DC
  Administered 2024-05-10 – 2024-05-11 (×2): 1000 mg via ORAL
  Filled 2024-05-10 (×2): qty 2

## 2024-05-10 MED ORDER — ACETAMINOPHEN 325 MG PO TABS: 325.0000 mg | ORAL_TABLET | Freq: Four times a day (QID) | ORAL | Status: DC | PRN

## 2024-05-10 MED ORDER — SUCCINYLCHOLINE CHLORIDE 200 MG/10ML IV SOSY
PREFILLED_SYRINGE | INTRAVENOUS | Status: AC
  Filled 2024-05-10: qty 10

## 2024-05-10 MED ORDER — KETAMINE HCL 50 MG/5ML IJ SOSY
PREFILLED_SYRINGE | INTRAMUSCULAR | Status: AC
  Filled 2024-05-10: qty 5

## 2024-05-10 MED ORDER — PHENYLEPHRINE HCL-NACL 20-0.9 MG/250ML-% IV SOLN
INTRAVENOUS | Status: DC | PRN
  Administered 2024-05-10: 30 ug/min via INTRAVENOUS

## 2024-05-10 MED ORDER — ACETAMINOPHEN 500 MG PO TABS
1000.0000 mg | ORAL_TABLET | Freq: Three times a day (TID) | ORAL | 0 refills | Status: AC
  Filled 2024-05-10: qty 30, 5d supply, fill #0

## 2024-05-10 MED ORDER — ROSUVASTATIN CALCIUM 10 MG PO TABS
40.0000 mg | ORAL_TABLET | Freq: Every day | ORAL | Status: DC
  Administered 2024-05-10: 40 mg via ORAL
  Filled 2024-05-10: qty 4

## 2024-05-10 MED ORDER — PROPOFOL 10 MG/ML IV BOLUS
INTRAVENOUS | Status: AC
  Filled 2024-05-10: qty 20

## 2024-05-10 MED ORDER — ACETAMINOPHEN 10 MG/ML IV SOLN
INTRAVENOUS | Status: DC | PRN
  Administered 2024-05-10: 1000 mg via INTRAVENOUS

## 2024-05-10 MED ORDER — LIDOCAINE HCL (PF) 2 % IJ SOLN
INTRAMUSCULAR | Status: AC
  Filled 2024-05-10: qty 5

## 2024-05-10 MED ORDER — OXYCODONE HCL 5 MG PO TABS
5.0000 mg | ORAL_TABLET | ORAL | Status: DC | PRN
  Administered 2024-05-10 – 2024-05-11 (×3): 5 mg via ORAL
  Filled 2024-05-10 (×3): qty 1

## 2024-05-10 MED ORDER — ONDANSETRON HCL 4 MG/2ML IJ SOLN
INTRAMUSCULAR | Status: DC | PRN
  Administered 2024-05-10: 4 mg via INTRAVENOUS

## 2024-05-10 MED ORDER — METOPROLOL SUCCINATE ER 25 MG PO TB24
100.0000 mg | ORAL_TABLET | Freq: Every day | ORAL | Status: DC
  Administered 2024-05-10: 100 mg via ORAL
  Filled 2024-05-10: qty 4

## 2024-05-10 MED ORDER — METOCLOPRAMIDE HCL 5 MG/ML IJ SOLN: 5.0000 mg | Freq: Three times a day (TID) | INTRAMUSCULAR | Status: DC | PRN

## 2024-05-10 MED ORDER — CEFAZOLIN SODIUM-DEXTROSE 2-4 GM/100ML-% IV SOLN
2.0000 g | Freq: Four times a day (QID) | INTRAVENOUS | Status: AC
  Administered 2024-05-10 – 2024-05-11 (×2): 2 g via INTRAVENOUS
  Filled 2024-05-10 (×2): qty 100

## 2024-05-10 MED ORDER — CEFAZOLIN SODIUM-DEXTROSE 2-4 GM/100ML-% IV SOLN
INTRAVENOUS | Status: AC
  Filled 2024-05-10: qty 100

## 2024-05-10 MED ORDER — PROPOFOL 10 MG/ML IV BOLUS
INTRAVENOUS | Status: DC | PRN
  Administered 2024-05-10: 120 mg via INTRAVENOUS

## 2024-05-10 MED ORDER — LACTATED RINGERS IV SOLN: INTRAVENOUS | Status: AC

## 2024-05-10 MED ORDER — AMLODIPINE BESYLATE 10 MG PO TABS: 5.0000 mg | ORAL_TABLET | Freq: Every day | ORAL | Status: DC

## 2024-05-10 MED ORDER — PHENYLEPHRINE HCL (PRESSORS) 10 MG/ML IV SOLN
INTRAVENOUS | Status: AC
  Filled 2024-05-10: qty 1

## 2024-05-10 MED ORDER — MENTHOL 3 MG MT LOZG: 1.0000 | LOZENGE | OROMUCOSAL | Status: DC | PRN

## 2024-05-10 MED ORDER — DOCUSATE SODIUM 100 MG PO CAPS
100.0000 mg | ORAL_CAPSULE | Freq: Two times a day (BID) | ORAL | Status: DC
  Filled 2024-05-10: qty 1

## 2024-05-10 MED ORDER — KETOROLAC TROMETHAMINE 15 MG/ML IJ SOLN
15.0000 mg | Freq: Four times a day (QID) | INTRAMUSCULAR | Status: AC
  Administered 2024-05-10 – 2024-05-11 (×4): 15 mg via INTRAVENOUS
  Filled 2024-05-10 (×4): qty 1

## 2024-05-10 MED ORDER — 0.9 % SODIUM CHLORIDE (POUR BTL) OPTIME
TOPICAL | Status: DC | PRN
  Administered 2024-05-10: 500 mL

## 2024-05-10 MED ORDER — CELECOXIB 200 MG PO CAPS
200.0000 mg | ORAL_CAPSULE | Freq: Two times a day (BID) | ORAL | 0 refills | Status: AC
  Filled 2024-05-10: qty 28, 14d supply, fill #0

## 2024-05-10 MED ORDER — OXYCODONE HCL 5 MG PO TABS
2.5000 mg | ORAL_TABLET | ORAL | 0 refills | Status: DC | PRN
  Filled 2024-05-10: qty 30, 5d supply, fill #0

## 2024-05-10 MED ORDER — ENOXAPARIN SODIUM 40 MG/0.4ML IJ SOSY
40.0000 mg | PREFILLED_SYRINGE | INTRAMUSCULAR | Status: DC
  Administered 2024-05-11: 40 mg via SUBCUTANEOUS
  Filled 2024-05-10: qty 0.4

## 2024-05-10 MED ORDER — SURGIPHOR WOUND IRRIGATION SYSTEM - OPTIME
TOPICAL | Status: DC | PRN
  Administered 2024-05-10: 450 mL

## 2024-05-10 MED ORDER — ACETAMINOPHEN 10 MG/ML IV SOLN
INTRAVENOUS | Status: AC
  Filled 2024-05-10: qty 100

## 2024-05-10 MED ORDER — LEVOTHYROXINE SODIUM 25 MCG PO TABS
125.0000 ug | ORAL_TABLET | Freq: Every day | ORAL | Status: DC
  Administered 2024-05-11: 125 ug via ORAL
  Filled 2024-05-10: qty 5

## 2024-05-10 MED ORDER — FENTANYL CITRATE (PF) 100 MCG/2ML IJ SOLN
INTRAMUSCULAR | Status: DC | PRN
  Administered 2024-05-10 (×2): 50 ug via INTRAVENOUS

## 2024-05-10 MED ORDER — ENOXAPARIN SODIUM 40 MG/0.4ML IJ SOSY
40.0000 mg | PREFILLED_SYRINGE | INTRAMUSCULAR | 0 refills | Status: DC
  Filled 2024-05-10: qty 5.6, 14d supply, fill #0

## 2024-05-10 MED ORDER — OXYCODONE HCL 5 MG PO TABS
5.0000 mg | ORAL_TABLET | Freq: Once | ORAL | Status: AC | PRN
  Administered 2024-05-10: 5 mg via ORAL

## 2024-05-10 MED ORDER — LIDOCAINE HCL (CARDIAC) PF 100 MG/5ML IV SOSY
PREFILLED_SYRINGE | INTRAVENOUS | Status: DC | PRN
  Administered 2024-05-10: 60 mg via INTRAVENOUS

## 2024-05-10 MED ORDER — TOPIRAMATE 100 MG PO TABS
100.0000 mg | ORAL_TABLET | Freq: Every day | ORAL | Status: DC
  Administered 2024-05-10: 100 mg via ORAL
  Filled 2024-05-10: qty 1

## 2024-05-10 MED ORDER — FENTANYL CITRATE (PF) 100 MCG/2ML IJ SOLN
INTRAMUSCULAR | Status: AC
  Filled 2024-05-10: qty 2

## 2024-05-10 MED ORDER — PHENOL 1.4 % MT LIQD: 1.0000 | OROMUCOSAL | Status: DC | PRN

## 2024-05-10 MED ORDER — CHLORHEXIDINE GLUCONATE 0.12 % MT SOLN
15.0000 mL | Freq: Once | OROMUCOSAL | Status: AC
  Administered 2024-05-10: 15 mL via OROMUCOSAL

## 2024-05-10 MED ORDER — ONDANSETRON HCL 4 MG/2ML IJ SOLN: 4.0000 mg | Freq: Four times a day (QID) | INTRAMUSCULAR | Status: DC | PRN

## 2024-05-10 MED ORDER — MIDAZOLAM HCL (PF) 2 MG/2ML IJ SOLN
INTRAMUSCULAR | Status: DC | PRN
  Administered 2024-05-10: 2 mg via INTRAVENOUS

## 2024-05-10 MED ORDER — ONDANSETRON HCL 4 MG/2ML IJ SOLN
INTRAMUSCULAR | Status: AC
  Filled 2024-05-10: qty 2

## 2024-05-10 MED ORDER — ROCURONIUM BROMIDE 10 MG/ML (PF) SYRINGE
PREFILLED_SYRINGE | INTRAVENOUS | Status: AC
  Filled 2024-05-10: qty 10

## 2024-05-10 MED ORDER — TRANEXAMIC ACID-NACL 1000-0.7 MG/100ML-% IV SOLN
INTRAVENOUS | Status: AC
  Filled 2024-05-10: qty 100

## 2024-05-10 MED ORDER — FENTANYL CITRATE (PF) 100 MCG/2ML IJ SOLN
25.0000 ug | INTRAMUSCULAR | Status: DC | PRN
  Administered 2024-05-10: 25 ug via INTRAVENOUS
  Administered 2024-05-10: 50 ug via INTRAVENOUS
  Administered 2024-05-10: 25 ug via INTRAVENOUS

## 2024-05-10 MED ORDER — MIDAZOLAM HCL 2 MG/2ML IJ SOLN
INTRAMUSCULAR | Status: AC
  Filled 2024-05-10: qty 2

## 2024-05-10 MED ORDER — PHENYLEPHRINE HCL-NACL 20-0.9 MG/250ML-% IV SOLN
INTRAVENOUS | Status: AC
  Filled 2024-05-10: qty 250

## 2024-05-10 MED ORDER — ROCURONIUM BROMIDE 100 MG/10ML IV SOLN
INTRAVENOUS | Status: DC | PRN
  Administered 2024-05-10: 20 mg via INTRAVENOUS
  Administered 2024-05-10: 50 mg via INTRAVENOUS

## 2024-05-10 MED ORDER — DULOXETINE HCL 30 MG PO CPEP
30.0000 mg | ORAL_CAPSULE | Freq: Every day | ORAL | Status: DC
  Administered 2024-05-11: 30 mg via ORAL
  Filled 2024-05-10: qty 1

## 2024-05-10 MED ORDER — EZETIMIBE 10 MG PO TABS
10.0000 mg | ORAL_TABLET | Freq: Every day | ORAL | Status: DC
  Administered 2024-05-11: 10 mg via ORAL

## 2024-05-10 MED ORDER — OXYCODONE HCL 5 MG PO TABS: 2.5000 mg | ORAL_TABLET | ORAL | Status: DC | PRN

## 2024-05-10 MED ORDER — PANTOPRAZOLE SODIUM 40 MG PO TBEC
40.0000 mg | DELAYED_RELEASE_TABLET | Freq: Every day | ORAL | Status: DC
  Administered 2024-05-10 – 2024-05-11 (×2): 40 mg via ORAL
  Filled 2024-05-10 (×2): qty 1

## 2024-05-10 MED ORDER — KETAMINE HCL 50 MG/5ML IJ SOSY
PREFILLED_SYRINGE | INTRAMUSCULAR | Status: DC | PRN
  Administered 2024-05-10: 20 mg via INTRAVENOUS

## 2024-05-10 MED ORDER — SODIUM CHLORIDE (PF) 0.9 % IJ SOLN
INTRAMUSCULAR | Status: DC | PRN
  Administered 2024-05-10: 50 mL via INTRAMUSCULAR

## 2024-05-10 MED ORDER — PHENYLEPHRINE 80 MCG/ML (10ML) SYRINGE FOR IV PUSH (FOR BLOOD PRESSURE SUPPORT)
PREFILLED_SYRINGE | INTRAVENOUS | Status: DC | PRN
  Administered 2024-05-10 (×4): 80 ug via INTRAVENOUS

## 2024-05-10 MED ORDER — CHLORHEXIDINE GLUCONATE 0.12 % MT SOLN
OROMUCOSAL | Status: AC
  Filled 2024-05-10: qty 15

## 2024-05-10 MED ORDER — KETOROLAC TROMETHAMINE 30 MG/ML IJ SOLN
INTRAMUSCULAR | Status: DC | PRN
  Administered 2024-05-10: 30 mg via INTRAVENOUS

## 2024-05-10 MED ORDER — TOPIRAMATE 100 MG PO TABS
50.0000 mg | ORAL_TABLET | Freq: Every day | ORAL | Status: DC
  Administered 2024-05-10: 50 mg via ORAL
  Filled 2024-05-10: qty 0.5

## 2024-05-10 MED ORDER — ASPIRIN 81 MG PO TBEC
81.0000 mg | DELAYED_RELEASE_TABLET | Freq: Every day | ORAL | Status: DC
  Administered 2024-05-11: 81 mg via ORAL
  Filled 2024-05-10: qty 1

## 2024-05-10 MED ORDER — SODIUM CHLORIDE 0.9 % IR SOLN
Status: DC | PRN
  Administered 2024-05-10: 100 mL

## 2024-05-10 MED ORDER — MORPHINE SULFATE (PF) 2 MG/ML IV SOLN: 0.5000 mg | INTRAVENOUS | Status: DC | PRN

## 2024-05-10 MED ORDER — ONDANSETRON HCL 4 MG PO TABS: 4.0000 mg | ORAL_TABLET | Freq: Four times a day (QID) | ORAL | Status: DC | PRN

## 2024-05-10 MED ORDER — SODIUM CHLORIDE 0.9 % IV SOLN: INTRAVENOUS | Status: DC

## 2024-05-10 MED ORDER — METOCLOPRAMIDE HCL 10 MG PO TABS: 5.0000 mg | ORAL_TABLET | Freq: Three times a day (TID) | ORAL | Status: DC | PRN

## 2024-05-10 MED ORDER — DOCUSATE SODIUM 100 MG PO CAPS
100.0000 mg | ORAL_CAPSULE | Freq: Two times a day (BID) | ORAL | 0 refills | Status: AC
  Filled 2024-05-10: qty 10, 5d supply, fill #0

## 2024-05-10 MED ORDER — SUGAMMADEX SODIUM 200 MG/2ML IV SOLN
INTRAVENOUS | Status: DC | PRN
  Administered 2024-05-10 (×3): 50 mg via INTRAVENOUS

## 2024-05-10 MED ORDER — OXYCODONE HCL 5 MG PO TABS
ORAL_TABLET | ORAL | Status: AC
  Filled 2024-05-10: qty 1

## 2024-05-10 SURGICAL SUPPLY — 58 items
BLADE CLIPPER SURG (BLADE) IMPLANT
BLADE SAGITTAL AGGR TOOTH XLG (BLADE) ×1 IMPLANT
BNDG COHESIVE 4X5 TAN STRL (GAUZE/BANDAGES/DRESSINGS) ×1 IMPLANT
BNDG COHESIVE 6X5 TAN ST LF (GAUZE/BANDAGES/DRESSINGS) ×2 IMPLANT
BRUSH SCRUB EZ PLAIN DRY (MISCELLANEOUS) ×1 IMPLANT
CHLORAPREP W/TINT 26 (MISCELLANEOUS) ×1 IMPLANT
DERMABOND ADVANCED .7 DNX12 (GAUZE/BANDAGES/DRESSINGS) ×1 IMPLANT
DRAPE C-ARM XRAY 36X54 (DRAPES) ×1 IMPLANT
DRAPE SHEET LG 3/4 BI-LAMINATE (DRAPES) ×2 IMPLANT
DRAPE TABLE BACK 80X90 (DRAPES) ×1 IMPLANT
DRSG MEPILEX SACRM 8.7X9.8 (GAUZE/BANDAGES/DRESSINGS) ×1 IMPLANT
DRSG OPSITE POSTOP 4X8 (GAUZE/BANDAGES/DRESSINGS) ×1 IMPLANT
ELECTRODE BLDE 4.0 EZ CLN MEGD (MISCELLANEOUS) ×1 IMPLANT
ELECTRODE REM PT RTRN 9FT ADLT (ELECTROSURGICAL) ×1 IMPLANT
GLOVE BIO SURGEON STRL SZ8 (GLOVE) ×1 IMPLANT
GLOVE BIOGEL PI IND STRL 8 (GLOVE) ×1 IMPLANT
GLOVE PI ORTHO PRO STRL 7.5 (GLOVE) ×2 IMPLANT
GLOVE PI ORTHO PRO STRL SZ8 (GLOVE) ×2 IMPLANT
GLOVE SURG SYN 7.5 PF PI (GLOVE) ×1 IMPLANT
GOWN SRG XL LONG LVL 3 NONREIN (GOWNS) ×1 IMPLANT
GOWN SRG XL LVL 3 NONREINFORCE (GOWNS) ×1 IMPLANT
GOWN STRL REUS W/ TWL LRG LVL3 (GOWN DISPOSABLE) ×1 IMPLANT
HEAD CERAMIC FEMORAL 36MM (Head) IMPLANT
HOOD PEEL AWAY T7 (MISCELLANEOUS) ×2 IMPLANT
INSERT 0 DEGREE 36 (Miscellaneous) IMPLANT
IV NS 100ML SINGLE PACK (IV SOLUTION) ×1 IMPLANT
KIT PATIENT CARE HANA TABLE (KITS) ×1 IMPLANT
KIT TURNOVER CYSTO (KITS) ×1 IMPLANT
LIGHT WAVEGUIDE WIDE FLAT (MISCELLANEOUS) ×1 IMPLANT
MANIFOLD NEPTUNE II (INSTRUMENTS) ×1 IMPLANT
MARKER SKIN DUAL TIP RULER LAB (MISCELLANEOUS) ×1 IMPLANT
MAT ABSORB FLUID 56X50 GRAY (MISCELLANEOUS) ×1 IMPLANT
NDL SPNL 20GX3.5 QUINCKE YW (NEEDLE) ×1 IMPLANT
NS IRRIG 500ML POUR BTL (IV SOLUTION) ×1 IMPLANT
PACK HIP COMPR (MISCELLANEOUS) ×1 IMPLANT
PAD ARMBOARD POSITIONER FOAM (MISCELLANEOUS) ×1 IMPLANT
PENCIL SMOKE EVACUATOR (MISCELLANEOUS) ×1 IMPLANT
SCREW HEX LP 6.5X15 (Screw) IMPLANT
SCREW HEX LP 6.5X25 (Screw) IMPLANT
SHELL TRIDENT II CLUST 50 (Shell) IMPLANT
SLEEVE SCD COMPRESS KNEE MED (STOCKING) ×1 IMPLANT
SOLN STERILE WATER BTL 1000 ML (IV SOLUTION) ×1 IMPLANT
SOLUTION IRRIG SURGIPHOR (IV SOLUTION) ×1 IMPLANT
STEM STD OFFSET SZ4 32.5 (Stem) IMPLANT
SURGIFLO W/THROMBIN 8M KIT (HEMOSTASIS) IMPLANT
SUT BONE WAX W31G (SUTURE) ×1 IMPLANT
SUT ETHIBOND 2 V 37 (SUTURE) ×1 IMPLANT
SUT SILK 0 30XBRD TIE 6 (SUTURE) ×1 IMPLANT
SUT STRATAFIX 14 PDO 36 VLT (SUTURE) ×1 IMPLANT
SUT VIC AB 0 CT1 36 (SUTURE) ×1 IMPLANT
SUT VIC AB 2-0 CT2 27 (SUTURE) ×1 IMPLANT
SUTURE STRATA SPIR 4-0 18 (SUTURE) ×1 IMPLANT
SYR 20ML LL LF (SYRINGE) ×2 IMPLANT
TAPE MICROFOAM 4IN (TAPE) IMPLANT
TOWEL OR 17X26 4PK STRL BLUE (TOWEL DISPOSABLE) IMPLANT
TRAP FLUID SMOKE EVACUATOR (MISCELLANEOUS) ×1 IMPLANT
TRAY FOLEY SLVR 16FR LF STAT (SET/KITS/TRAYS/PACK) IMPLANT
WAND WEREWOLF FASTSEAL 6.0 (MISCELLANEOUS) ×1 IMPLANT

## 2024-05-10 NOTE — Interval H&P Note (Signed)
 Patient history and physical updated. Consent reviewed including risks, benefits, and alternatives to surgery. Patient agrees with above plan to proceed with right anterior total hip arthroplasty.

## 2024-05-10 NOTE — Plan of Care (Signed)
  Problem: Education: Goal: Knowledge of the prescribed therapeutic regimen will improve Outcome: Progressing Goal: Understanding of discharge needs will improve Outcome: Progressing Goal: Individualized Educational Video(s) Outcome: Progressing   Problem: Activity: Goal: Ability to avoid complications of mobility impairment will improve Outcome: Progressing Goal: Ability to tolerate increased activity will improve Outcome: Progressing   Problem: Clinical Measurements: Goal: Postoperative complications will be avoided or minimized Outcome: Progressing   Problem: Pain Management: Goal: Pain level will decrease with appropriate interventions Outcome: Progressing   Problem: Skin Integrity: Goal: Will show signs of wound healing Outcome: Progressing   Problem: Education: Goal: Knowledge of General Education information will improve Description: Including pain rating scale, medication(s)/side effects and non-pharmacologic comfort measures Outcome: Progressing   Problem: Health Behavior/Discharge Planning: Goal: Ability to manage health-related needs will improve Outcome: Progressing   Problem: Clinical Measurements: Goal: Ability to maintain clinical measurements within normal limits will improve Outcome: Progressing Goal: Will remain free from infection Outcome: Progressing Goal: Diagnostic test results will improve Outcome: Progressing Goal: Respiratory complications will improve Outcome: Progressing Goal: Cardiovascular complication will be avoided Outcome: Progressing   Problem: Activity: Goal: Risk for activity intolerance will decrease Outcome: Progressing   Problem: Nutrition: Goal: Adequate nutrition will be maintained Outcome: Progressing   Problem: Coping: Goal: Level of anxiety will decrease Outcome: Progressing   Problem: Elimination: Goal: Will not experience complications related to bowel motility Outcome: Progressing Goal: Will not experience  complications related to urinary retention Outcome: Progressing   Problem: Pain Managment: Goal: General experience of comfort will improve and/or be controlled Outcome: Progressing   Problem: Safety: Goal: Ability to remain free from injury will improve Outcome: Progressing   Problem: Skin Integrity: Goal: Risk for impaired skin integrity will decrease Outcome: Progressing

## 2024-05-10 NOTE — Transfer of Care (Signed)
 Immediate Anesthesia Transfer of Care Note  Patient: [name] Rowe  Procedure(s) Performed: ARTHROPLASTY, HIP, TOTAL, ANTERIOR APPROACH (Right: Hip)  Patient Location: PACU  Anesthesia Type:General  Level of Consciousness: drowsy and patient cooperative  Airway & Oxygen Therapy: Patient Spontanous Breathing and Patient connected to face mask oxygen  Post-op Assessment: Report given to RN and Post -op Vital signs reviewed and stable  Post vital signs: Reviewed and stable  Last Vitals:  Vitals Value Taken Time  BP 95/59 05/10/24 12:35  Temp    Pulse 68 05/10/24 12:33  Resp 10 05/10/24 12:36  SpO2 100 % 05/10/24 12:33  Vitals shown include unfiled device data.  Last Pain:  Vitals:   05/10/24 0900  TempSrc: Tympanic  PainSc: 8          Complications: No notable events documented.

## 2024-05-10 NOTE — Anesthesia Preprocedure Evaluation (Addendum)
 Anesthesia Evaluation  Patient identified by MRN, date of birth, ID band Patient awake    Reviewed: Allergy & Precautions, NPO status , Patient's Chart, lab work & pertinent test results  History of Anesthesia Complications Negative for: history of anesthetic complications  Airway Mallampati: III  TM Distance: <3 FB Neck ROM: full    Dental  (+) Chipped   Pulmonary neg pulmonary ROS, neg shortness of breath   Pulmonary exam normal        Cardiovascular Exercise Tolerance: Good hypertension, + angina  + CAD, + Past MI, + Cardiac Stents and + Peripheral Vascular Disease  Normal cardiovascular exam     Neuro/Psych  Headaches  Neuromuscular disease CVA    GI/Hepatic negative GI ROS, Neg liver ROS,,,  Endo/Other  Hypothyroidism    Renal/GU Renal disease     Musculoskeletal   Abdominal   Peds  Hematology negative hematology ROS (+)   Anesthesia Other Findings Past Medical History: No date: Adenomyosis No date: Anginal pain 2001: Anxiety No date: Chest pain     Comment:  a. No angiographic CAD by cath 2009 in [place].               There was catheter-induced RCA vasospasm versus               microvascular obstruction. b. Lexiscan  myoview  04/2010 -               EF 79%, normal perfusion.;  c. ETT-Myoview  7/13: no               ischemia, EF 77%, submax exercise No date: Chronic back pain No date: Chronic kidney disease No date: Complication of anesthesia     Comment:  a.) postoperative micturitional difficulties; b.) CVA               following epidural in 2021 No date: Coronary artery disease of native artery of native heart  with stable angina pectoris No date: Coronary vasospasm 2006: CVA (cerebral vascular accident) [facility])     Comment:  lost hearing on left and 2021 2001: Depression No date: Diastolic dysfunction 2009: Fibromuscular dysplasia     Comment:  a. Renal artery stenosis due to this. b.  Carotid               dopplers 07/2011 c/w FMD but no significant stenosis 1999: Fibromyalgia 2003: H/O Graves' disease     Comment:  s/p radio-iodine treatment in 2003  No date: Hearing loss of left ear     Comment:  s/p CVA 2006 2009: History of stent insertion of RIGHT renal artery 2000: HLD (hyperlipidemia) 1988: HTN (hypertension)     Comment:  a. Likely related to RAS; urinary metanephrines and               plasma renin/aldosterone ratio normal. b. H/o HTN urgency              06/2011 after being off BP meds for a number of days 06/29/2011: Hypertensive crisis 2003: Hypothyroidism     Comment:  Graves disease s/p radio-iodine treatment in 2003  2000: Inappropriate sinus tachycardia     Comment:  10/27/10-Holter -NSR with occ sinus tachy-rare PACs, no               sig arrhythmia No date: Irritable bowel syndrome No date: Leiomyoma No date: Long-term use of aspirin  therapy No date: Migraine headache No date: Multiple sclerosis with acute neurologic event 2009: Myocardial infarction (HCC)  02/23/2013: Personal history of colonic polyps-sessile serrated  adenoma No date: Pre-diabetes No date: Renal artery stenosis     Comment:  a. Due to fibromuscular dysplasia. b. Renal stent 2009.               c. Studies:  CTA abdomen (1/13) with evidence for               fibromuscular dysplasia, right renal artery stent appears              patent. Renal artery dopplers (2/13) with FMD but no               evidence for signficant stenosis.  No date: Unilateral hearing loss  Past Surgical History: 2010 and 2011: BACK SURGERY     Comment:  In [place].  11/19/2008 (possibly a laminectomy) and in May               of 2011 she underwent a L2-L3 fusion. No date: bunion removed ; Bilateral No date: cardiac cath 2009: CARDIAC CATHETERIZATION     Comment:  states it was normal 02/03/2015: CARDIAC CATHETERIZATION; N/A     Comment:  Procedure: Left Heart Cath and Coronary Angiography;                 Surgeon: Ezra GORMAN Shuck, MD;  Location: [facility] INVASIVE CV               LAB;  Service: Cardiovascular;  Laterality: N/A; last 02/16/2013: COLONOSCOPY No date: COLONOSCOPY 1988 and 1989: HIP SURGERY; Bilateral     Comment:  scraped head of femur No date: KNEE ARTHROSCOPY; Left 05/25/2013: LUMBAR LAMINECTOMY/DECOMPRESSION MICRODISCECTOMY; Left     Comment:  Procedure: LUMBAR LAMINECTOMY/DECOMPRESSION               MICRODISCECTOMY 1 LEVEL;  Surgeon: Rockey LITTIE Peru, MD;                Location: [facility] NEURO ORS;  Service: Neurosurgery;                Laterality: Left;  LEFT L5S1 microdiskectomy No date: POLYPECTOMY 2009: RENAL ARTERY STENT 05/13/2008: ROBOTIC ASSISTED LAP VAGINAL HYSTERECTOMY     Comment:  [facility] da Vinci hysterectomy  for myomatous uterus  No date: SPINE SURGERY No date: VAGINAL HYSTERECTOMY  BMI    Body Mass Index: 21.91 kg/m      Reproductive/Obstetrics negative OB ROS                              Anesthesia Physical Anesthesia Plan  ASA: 3  Anesthesia Plan: General ETT   Post-op Pain Management:    Induction: Intravenous  PONV Risk Score and Plan: Ondansetron , Dexamethasone, Midazolam  and Treatment may vary due to age or medical condition  Airway Management Planned: Oral ETT  Additional Equipment:   Intra-op Plan:   Post-operative Plan: Extubation in OR  Informed Consent: I have reviewed the patients History and Physical, chart, labs and discussed the procedure including the risks, benefits and alternatives for the proposed anesthesia with the patient or authorized representative who has indicated his/her understanding and acceptance.     Dental Advisory Given  Plan Discussed with: Anesthesiologist, CRNA and Surgeon  Anesthesia Plan Comments: (Patient declines spinal and requests GA  Patient consented for risks of anesthesia including but not limited to:  - adverse reactions to medications - damage to eyes, teeth, lips or  other oral  mucosa - nerve damage due to positioning  - sore throat or hoarseness - Damage to heart, brain, nerves, lungs, other parts of body or loss of life  Patient voiced understanding and assent.)         Anesthesia Quick Evaluation

## 2024-05-10 NOTE — Anesthesia Postprocedure Evaluation (Signed)
 Anesthesia Post Note  Patient: [name] Rowe  Procedure(s) Performed: ARTHROPLASTY, HIP, TOTAL, ANTERIOR APPROACH (Right: Hip)  Anesthesia Type: General Anesthetic complications: no   No notable events documented.   Last Vitals:  Vitals:   05/10/24 0900  BP: 126/73  Pulse: 79  Resp: 18  Temp: (!) 36.4 C  SpO2: 100%    Last Pain:  Vitals:   05/10/24 0900  TempSrc: Tympanic  PainSc: 8                  Ottie Neglia I Jamario Colina

## 2024-05-10 NOTE — Discharge Instructions (Signed)
 Instructions after Anterior Total Hip Replacement        Dr. Regenia Skeeter., M.D.      Dept. of Orthopaedics & Sports Medicine  [facility]  [address]  [place], [place]  [address]  Phone: [phone]   Fax: [phone]    DIET: Drink plenty of non-alcoholic fluids. Resume your normal diet. Include foods high in fiber.  ACTIVITY:  You may use crutches or a walker with weight-bearing as tolerated, unless instructed otherwise. You may be weaned off of the walker or crutches by your Physical Therapist.  Continue doing gentle exercises. Exercising will reduce the pain and swelling, increase motion, and prevent muscle weakness.   Please continue to use the TED compression stockings for 2 weeks. You may remove the stockings at night, but should reapply them in the morning. Do not drive or operate any equipment until instructed.  WOUND CARE:  Continue to use ice packs periodically to reduce pain and swelling. You may shower with honeycomb dressing 3 days after your surgery. Do not submerge incision site under water. Remove honeycomb dressing 7 days after surgery and allow dermabond to fall off on its own.   MEDICATIONS: You may resume your regular medications. Please take the pain medication as prescribed on the medication list. Do not take pain medication on an empty stomach. You have been given a prescription for a blood thinner to prevent blood clots. Please take the medication as instructed. (NOTE: After completing a 2 week course of Lovenox, take one Enteric-coated 81 mg aspirin twice a day for 3 additional weeks.) Pain medications and iron supplements can cause constipation. Use a stool softener (Senokot or Colace) on a daily basis and a laxative (dulcolax or miralax) as needed. Do not drive or drink alcoholic beverages when taking pain medications.  POSTOPERATIVE CONSTIPATION PROTOCOL Constipation - defined medically as fewer than three stools per week and  severe constipation as less than one stool per week.  One of the most common issues patients have following surgery is constipation.  Even if you have a regular bowel pattern at home, your normal regimen is likely to be disrupted due to multiple reasons following surgery.  Combination of anesthesia, postoperative narcotics, change in appetite and fluid intake all can affect your bowels.  In order to avoid complications following surgery, here are some recommendations in order to help you during your recovery period.  Colace (docusate) - Pick up an over-the-counter form of Colace or another stool softener and take twice a day as long as you are requiring postoperative pain medications.  Take with a full glass of water daily.  If you experience loose stools or diarrhea, hold the colace until you stool forms back up.  If your symptoms do not get better within 1 week or if they get worse, check with your doctor.  Dulcolax (bisacodyl) - Pick up over-the-counter and take as directed by the product packaging as needed to assist with the movement of your bowels.  Take with a full glass of water.  Use this product as needed if not relieved by Colace only.   MiraLax (polyethylene glycol) - Pick up over-the-counter to have on hand.  MiraLax is a solution that will increase the amount of water in your bowels to assist with bowel movements.  Take as directed and can mix with a glass of water, juice, soda, coffee, or tea.  Take if you go more than two days without a movement. Do not use MiraLax more than  once per day. Call your doctor if you are still constipated or irregular after using this medication for 7 days in a row.  If you continue to have problems with postoperative constipation, please contact the office for further assistance and recommendations.  If you experience "the worst abdominal pain ever" or develop nausea or vomiting, please contact the office immediatly for further recommendations for  treatment.   CALL THE OFFICE FOR: Temperature above 101 degrees Excessive bleeding or drainage on the dressing. Excessive swelling, coldness, or paleness of the toes. Persistent nausea and vomiting.  FOLLOW-UP:  You should have an appointment to return to the office in 2 weeks after surgery. Arrangements have been made for continuation of Physical Therapy (either home therapy or outpatient therapy).

## 2024-05-10 NOTE — Progress Notes (Signed)
 Patient is not able to walk the distance required to go the bathroom, or she is unable to safely negotiate stairs required to access the bathroom.  A 3in1 BSC will alleviate this problem.       Lollie Marrow, PA-C [facility]

## 2024-05-10 NOTE — Progress Notes (Signed)
 Updated patient family, doing well, awaiting a room assignment

## 2024-05-10 NOTE — Discharge Summary (Signed)
 Physician Discharge Summary  Patient ID: [name] Rowe MRN: [id number] DOB/AGE: 09-10-1960 63 y.o.  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 [M24.151] S/P total right hip arthroplasty [Z96.641]   Discharge Diagnoses: Patient Active Problem List   Diagnosis Date Noted   S/P total right hip arthroplasty 05/10/2024   CAD (coronary artery disease) 06/19/2018   At risk for sleep apnea 05/04/2017   Tachycardia 04/05/2017   Poor sleep pattern 10/20/2016   Fatigue 04/07/2016   Neck pain on left side 03/16/2016   Acute left-sided thoracic back pain 03/16/2016   Pain of left breast 10/30/2015   Lichen sclerosus 08/15/2014   Extremity edema 06/24/2014   Diplopia    History of coronary vasospasm 04/12/2014   Bursitis, trochanteric 11/22/2013   Essential hypertension 11/19/2013   Acute low back pain 08/10/2013   History of colonic polyps 02/23/2013   HNP (herniated nucleus pulposus), lumbar 12/01/2012   Lumbar radicular syndrome 12/01/2012   Seasonal allergies 11/30/2012   Intervertebral disc protrusion 11/06/2012   Situational anxiety 09/05/2012   Vitamin D  insufficiency 06/30/2012   Unilateral hearing loss 06/30/2012   Vestibular dizziness 06/30/2012   Routine adult health maintenance 03/31/2012   Fibromuscular dysplasia (HCC) 08/13/2011   Dizziness and palpitations, episodic 07/06/2011   Poorly-controlled hypertension    History of CVA (cerebrovascular accident)    Hypothyroidism 04/10/2010   Dyslipidemia 04/10/2010   Anxiety 04/10/2010   Depression 04/10/2010   Migraine headache 04/10/2010   Renal artery stenosis (HCC) 04/10/2010   Fibromyalgia 04/10/2010   Status post lumbar surgery 04/10/2010   Backache 04/09/2010    Past Medical History:  Diagnosis Date   Adenomyosis    Anginal pain     Anxiety 2001   Chest pain    a. No angiographic CAD by cath 2009 in [place]. There was catheter-induced RCA vasospasm versus microvascular obstruction. b. Lexiscan  myoview  04/2010 - EF 79%, normal perfusion.;  c. ETT-Myoview  7/13: no ischemia, EF 77%, submax exercise   Chronic back pain    Chronic kidney disease    Complication of anesthesia    a.) postoperative micturitional difficulties; b.) CVA following epidural in 2021   Coronary artery disease of native artery of native heart with stable angina pectoris    Coronary vasospasm    CVA (cerebral vascular accident) (HCC) 2006   lost hearing on left and 2021   Depression 2001   Diastolic dysfunction    Fibromuscular dysplasia 2009   a. Renal artery stenosis due to this. b. Carotid dopplers 07/2011 c/w FMD but no significant stenosis   Fibromyalgia 1999   H/O Graves' disease 2003   s/p radio-iodine treatment in 2003    Hearing loss of left ear    s/p CVA 2006   History of stent insertion of RIGHT renal artery 2009   HLD (hyperlipidemia) 2000   HTN (hypertension) 1988   a. Likely related to RAS; urinary metanephrines and plasma renin/aldosterone ratio normal. b. H/o HTN urgency 06/2011 after being off BP meds for a number of days   Hypertensive crisis 06/29/2011   Hypothyroidism 2003   Graves disease s/p radio-iodine treatment in 2003    Inappropriate sinus tachycardia 2000   10/27/10-Holter -NSR with occ sinus tachy-rare PACs, no sig arrhythmia   Irritable bowel syndrome    Leiomyoma    Long-term use of aspirin  therapy    Migraine headache  Multiple sclerosis with acute neurologic event    Myocardial infarction [facility]) 2009   Personal history of colonic polyps-sessile serrated adenoma 02/23/2013   Pre-diabetes    Renal artery stenosis    a. Due to fibromuscular dysplasia. b. Renal stent 2009. c. Studies:  CTA abdomen (1/13) with evidence for fibromuscular dysplasia, right renal artery stent appears patent. Renal artery  dopplers (2/13) with FMD but no evidence for signficant stenosis.    Unilateral hearing loss      Transfusion: none   Consultants (if any):   Discharged Condition: Improved  Hospital Course: [name] Rowe is an 63 y.o. female who was admitted 05/10/2024 with a diagnosis of S/P total right hip arthroplasty and went to the operating room on 05/10/2024 and underwent the above named procedures.    Surgeries: Procedure(s): ARTHROPLASTY, HIP, TOTAL, ANTERIOR APPROACH on 05/10/2024 Patient tolerated the surgery well. Taken to PACU where she was stabilized and then transferred to the orthopedic floor.  Started on Lovenox  40mg  q 24 hrs. TEDs and SCDs applied bilaterally. Heels elevated on bed. No evidence of DVT. Negative Homan. Physical therapy started on day #1 for gait training and transfer. OT started day #1 for ADL and assisted devices.  Patient's IV was d/c on day #1. Patient was able to safely and independently complete all PT goals. PT recommending discharge to home.    On post op day #1 patient was stable and ready for discharge to home with HHPT.  Implants:  Cup: Trident Tritanium Clusterhole 50/D w/x2 screws   Liner: Neutral X3 Poly 36/D  Stem: Insignia #4 Std  Head:Biolox ceramic 36 +0   She was given perioperative antibiotics:  Anti-infectives (From admission, onward)    Start     Dose/Rate Route Frequency Ordered Stop   05/10/24 1900  ceFAZolin  (ANCEF ) IVPB 2g/100 mL premix        2 g 200 mL/hr over 30 Minutes Intravenous Every 6 hours 05/10/24 1846 05/11/24 0213   05/10/24 0600  ceFAZolin  (ANCEF ) IVPB 2g/100 mL premix        2 g 200 mL/hr over 30 Minutes Intravenous On call to O.R. 05/09/24 2210 05/10/24 1038     .  She was given sequential compression devices, early ambulation, and lovenox  TEDs for DVT prophylaxis.  She benefited maximally from the hospital stay and there were no complications.    Recent vital signs:  Vitals:   05/11/24 0431 05/11/24 0809  BP: (!)  101/59 107/65  Pulse: 72 68  Resp: 18 15  Temp: (!) 97.3 F (36.3 C) 98.1 F (36.7 C)  SpO2: 100% 100%    Recent laboratory studies:  Lab Results  Component Value Date   HGB 11.7 (L) 05/11/2024   HGB 13.6 05/10/2024   HGB 13.4 04/23/2024   Lab Results  Component Value Date   WBC 11.9 (H) 05/11/2024   PLT 240 05/11/2024   Lab Results  Component Value Date   INR 1.09 02/03/2015   Lab Results  Component Value Date   NA 136 05/11/2024   K 4.3 05/11/2024   CL 108 05/11/2024   CO2 19 (L) 05/11/2024   BUN 14 05/11/2024   CREATININE 0.83 05/11/2024   GLUCOSE 126 (H) 05/11/2024    Discharge Medications:   Allergies as of 05/11/2024       Reactions   Hydromorphone Hcl Anxiety, Other (See Comments), Shortness Of Breath, Swelling   Palpitations    Palpitations Palpitations  Palpitations    Palpitations Palpitations   Prochlorperazine  Edisylate Other (See Comments)   Causes Seizures   Sumatriptan Palpitations   Atorvastatin  Other (See Comments)   Joint pain   Lisinopril Cough   Tramadol  Anxiety        Medication List     STOP taking these medications    metroNIDAZOLE 500 MG tablet Commonly known as: FLAGYL   VITAMIN B-12 CR PO       TAKE these medications    acetaminophen  500 MG tablet Commonly known as: TYLENOL  Take 2 tablets (1,000 mg total) by mouth every 8 (eight) hours.   amLODipine  5 MG tablet Commonly known as: NORVASC  Take 1 tablet (5 mg total) by mouth daily.   aspirin  EC 81 MG tablet Take 81 mg by mouth daily.   celecoxib  200 MG capsule Commonly known as: CeleBREX  Take 1 capsule (200 mg total) by mouth 2 (two) times daily for 14 days.   ciprofloxacin 250 MG tablet Commonly known as: CIPRO Take 250 mg by mouth 2 (two) times daily.   docusate sodium  100 MG capsule Commonly known as: COLACE Take 1 capsule (100 mg total) by mouth 2 (two) times daily.   DULoxetine  30 MG capsule Commonly known as: CYMBALTA  Take 30 mg by mouth  daily.   enoxaparin  40 MG/0.4ML injection Commonly known as: LOVENOX  Inject 0.4 mLs (40 mg total) into the skin daily for 14 days.   ezetimibe  10 MG tablet Commonly known as: ZETIA  Take 1 tablet (10 mg total) by mouth daily.   hydrOXYzine  25 MG tablet Commonly known as: ATARAX  Take 25 mg by mouth 3 (three) times daily as needed for itching.   hyoscyamine  0.125 MG tablet Commonly known as: LEVSIN  Take 1 tablet (0.125 mg total) by mouth every 4 (four) hours as needed (diarrhea).   levothyroxine  125 MCG tablet Commonly known as: SYNTHROID  Take 125 mcg by mouth daily before breakfast.   Linzess  290 MCG Caps capsule Generic drug: linaclotide  TAKE 1 CAPSULE BY MOUTH DAILY   metoprolol  succinate 100 MG 24 hr tablet Commonly known as: TOPROL -XL Take 1 tablet (100 mg total) by mouth daily. Take with or immediately following a meal.   nitroGLYCERIN  0.4 MG SL tablet Commonly known as: NITROSTAT  Place 1 tablet (0.4 mg total) under the tongue every 5 (five) minutes as needed. x3 doses as needed for chest pain   ondansetron  4 MG tablet Commonly known as: ZOFRAN  Take 1 tablet (4 mg total) by mouth every 6 (six) hours as needed for nausea.   oxyCODONE  5 MG immediate release tablet Commonly known as: Oxy IR/ROXICODONE  Take 0.5-1 tablets (2.5-5 mg total) by mouth every 4 (four) hours as needed for severe pain (pain score 7-10).   potassium chloride  10 MEQ tablet Commonly known as: KLOR-CON  Take 10 mEq by mouth in the morning and at bedtime.   potassium chloride  10 MEQ tablet Commonly known as: KLOR-CON  M Take 4 tablets (40 mEq) today, then 1 tablet (10 mEq) twice daily x 10 additional days.  Follow-up with PCP for repeat labs.   rosuvastatin  40 MG tablet Commonly known as: Crestor  Take 1 tablet (40 mg total) by mouth daily.   topiramate  100 MG tablet Commonly known as: TOPAMAX  Take 100 mg by mouth at bedtime.   topiramate  50 MG tablet Commonly known as: TOPAMAX  Take 50 mg by  mouth at bedtime.               Durable Medical Equipment  (From admission, onward)  Start     Ordered   05/10/24 1255  For home use only DME Walker rolling  Once       Question Answer Comment  Walker: With 5 Inch Wheels   Patient needs a walker to treat with the following condition H/O total hip arthroplasty, right      05/10/24 1254   05/10/24 1255  For home use only DME 3 n 1  Once        05/10/24 1254            Diagnostic Studies: DG HIP UNILAT W OR W/O PELVIS 2-3 VIEWS RIGHT Result Date: 05/10/2024 CLINICAL DATA:  Right total hip arthroplasty. EXAM: DG HIP (WITH OR WITHOUT PELVIS) 2-3V RIGHT COMPARISON:  09/06/2023 FINDINGS: Evidence of patient's right total hip arthroplasty with prosthetic components intact and normally located. Recommend correlation with findings at the time of the procedure. IMPRESSION: Right total hip arthroplasty. Electronically Signed   By: Toribio Agreste M.D.   On: 05/10/2024 12:32   DG C-Arm 1-60 Min-No Report Result Date: 05/10/2024 Fluoroscopy was utilized by the requesting physician.  No radiographic interpretation.   DG C-Arm 1-60 Min-No Report Result Date: 05/10/2024 Fluoroscopy was utilized by the requesting physician.  No radiographic interpretation.   CT ABDOMEN PELVIS W CONTRAST Result Date: 04/23/2024 CLINICAL DATA:  Left lower quadrant abdominal pain, diarrhea for 12 days EXAM: CT ABDOMEN AND PELVIS WITH CONTRAST TECHNIQUE: Multidetector CT imaging of the abdomen and pelvis was performed using the standard protocol following bolus administration of intravenous contrast. RADIATION DOSE REDUCTION: This exam was performed according to the departmental dose-optimization program which includes automated exposure control, adjustment of the mA and/or kV according to patient size and/or use of iterative reconstruction technique. CONTRAST:  OMNIPAQUE  IOHEXOL  300 MG/ML  SOLN COMPARISON:  None Available. FINDINGS: Lower chest:  No acute abnormality. Hepatobiliary: No focal liver abnormality is seen. No gallstones, gallbladder wall thickening, or biliary dilatation. Pancreas: Unremarkable. No pancreatic ductal dilatation or surrounding inflammatory changes. Spleen: Normal in size without focal abnormality. Adrenals/Urinary Tract: Adrenal glands are unremarkable. Kidneys are normal, without renal calculi, focal lesion, or hydronephrosis. Bladder is unremarkable. Stomach/Bowel: Negative for bowel obstruction, significant dilatation, ileus, free air. Stomach and majority of small bowel unremarkable. Colon is diffusely fluid distended with scattered air-fluid levels. There is also mild diffuse colonic wall thickening and mucosal enhancement compatible with pancolitis and ongoing diarrhea. No free fluid, fluid collection, hemorrhage, hematoma, abscess or ascites. Vascular/Lymphatic: Minor aortic atherosclerosis. Mesenteric and renal vasculature appear patent. Right renal stent noted as before. No acute vascular finding or aneurysm. No retroperitoneal hemorrhage or hematoma. No veno-occlusive process. No bulky adenopathy. Reproductive: Status post hysterectomy. No adnexal masses. Other: No abdominal wall hernia or abnormality. No abdominopelvic ascites. Musculoskeletal: Degenerative changes and postoperative findings of the spine. No acute osseous finding. IMPRESSION: 1. CT findings compatible with pancolitis and ongoing diarrhea. 2. No other acute intra-abdominal or pelvic finding. 3. Aortic atherosclerosis. Aortic Atherosclerosis (ICD10-I70.0). Electronically Signed   By: CHRISTELLA.  Shick M.D.   On: 04/23/2024 15:56    Disposition: Discharge disposition: 06-Home-Health Care Svc          Follow-up Information     Charlene Debby BROCKS, PA-C Follow up in 2 week(s).   Specialties: Orthopedic Surgery, Emergency Medicine Contact information: [address] [place] [address] [phone]                  Signed: Debby BROCKS Charlene 05/11/2024, 8:32 AM

## 2024-05-10 NOTE — Progress Notes (Signed)
 PT Cancellation Note  Patient Details Name: [name] Rowe MRN: [id number] DOB: [birth year]   Cancelled Treatment:    Reason Eval/Treat Not Completed: Patient not medically ready Just received PT order, patient is not in room or ready for PT eval at this time. Will follow up tomorrow am.   Bre Pecina 05/10/2024, 3:48 PM

## 2024-05-10 NOTE — Op Note (Signed)
 Patient Name: [name] Rowe  MRN: [id number]  Pre-Operative Diagnosis: Right hip Osteoarthritis  Post-Operative Diagnosis: (same)  Procedure: Right Total Hip Arthroplasty  Components/Implants: Cup: Trident Tritanium Clusterhole 50/D w/x2 screws   Liner: Neutral X3 Poly 36/D  Stem: Insignia #4 Std  Head:Biolox ceramic 36 +0  Date of Surgery: 05/10/2024  Surgeon: Arthea Sheer MD  Assistant: Debby Amber PA (present and scrubbed throughout the case, critical for assistance with exposure, retraction, instrumentation, and closure)  Anesthesiologist: Piscitello  Anesthesia: General   EBL: 150cc  IVF:600cc  Complications: None   Brief history: The patient is a 63 year old female with a history of osteoarthritis of the right hip with pain limiting their range of motion and activities of daily living, which has failed multiple attempts at conservative therapy.  The risks and benefits of total hip arthroplasty as definitive surgical treatment were discussed with the patient, who opted to proceed with the operation.  After outpatient medical clearance and optimization was completed the patient was admitted to [facility] for the procedure.  All preoperative films were reviewed and an appropriate surgical plan was made prior to surgery.   Description of procedure: The patient was brought to the operating room where laterality was confirmed by all those present to be the right side.  The patient was administered general anesthesia on a stretcher prior to being moved supine on the operating room table. Patient was given an intravenous dose of antibiotics for surgical prophylaxis and TXA . All bony prominences and extremities were well padded and the patient was securely attached to the table boots, a perineal post was placed and the patient had a safety strap placed.  Surgical site was prepped with alcohol and chlorhexidine. The surgical site over the hip was and draped in  typical sterile fashion with multiple layers of adhesive and nonadhesive drapes.  The incision site was marked out with a sterile marker and care was taken to assess the position of the ASIS and ensure appropriate position for the incision.    A surgical timeout was then called with participation of all staff in the room the patient was then a confirmed again and laterality confirmed.  Incision was made over the anterior lateral aspect of the proximal thigh in line with the TFL.  Appropriate retractors were placed and all bleeding vessels were coagulated within the subcutaneous and fatty layers.  An incision was made in the TFL fascia in the interval was carefully identified.  The lateral ascending branches of the circumflex vessels were identified, cauterized and carefully dissected. The main vessels were then tied with a 0 silk hand tie.  Retractors were placed around the superior lateral and inferior medial aspects of the femoral neck and a capsulotomy was performed exposing the hip joint.  Retraction stitches were placed and the capsulotomy to assist with visualization.  The femoral head and anterior acetabulum were examined  at this time and shown to have full thickness cartilage damage with labral tearing and large osteophyte on the lateral cam lesion, the decision was made to proceed with total hip arthroplasty. Femoral neck cut was then made and the femoral head was extracted after placing the leg in traction.  Bone wax was then applied to the proximal cut surface of the femur and water cooled bipolar electrocautery was used to address any bleeding around the femoral neck cut.  Retractors were then placed around the acetabulum to fully visualize the joint space, and the remaining labral tissue was removed and pulvinar  was removed.   The acetabulum was then sequentially reamed up to the appropriate size in order to get good fit and fill for the acetabular component while under fluoroscopic guidance.   Acetabular component was then placed and malleted into a secure fit while confirming position and abduction angle and anteversion utilizing fluoroscopy.  2 screws were then placed in the acetabular cup to assist in securing the cup in place. The cup was irrigated,  a real neutral liner was placed, impacted, and checked for stability. The femur traction was dropped and sequentially externally rotated while performing a release of the posterior and superomedial tissues off of the proximal femur to allow for mobility, care was taken to preserve the external rotators and piriformis attachments.  The remaining interval between the abductors and the capsule was dissected out and a retractor was placed over the superolateral aspect of the femur over the greater trochanter.  The leg was carefully brought down into extension and adducted to provide visualization of the proximal femur for broaching.  The femur was then sequentially broached up to an appropriate size which provided for good fill and stability to the femoral broach.  A trial neck and head were placed on the femoral broach and the leg was brought up for reduction.  The hip was reduced and manual check of stability was performed.  The hip was found to be stable in flexion internal rotation and extension external rotation.  Leg lengths were confirmed on fluoroscopy.   The hip was then dislocated the trial neck and head were removed.  The leg was then brought down into extension and adduction in the proximal femur was reexposed.  The broach trial was removed and the femur was irrigated with normal saline prior to the real femoral stem being implanted.  After the femoral stem was seated and shown to have good fit and fill the appropriate head was impacted the leg was brought up and reduced.  There was good range of motion with stability in flexion internal rotation and extension external rotation on testing.  Leg lengths were found to be appropriate on  fluoroscopic evaluation at this time.  The hip was then irrigated with betdine based surgiphor solution and then saline solution.  The capsulotomy was repaired with Ethibond sutures.  A pericapsular and peritrochanteric cocktail with Exparel and bupivacaine was then injected as well as the subcutaneous tissues. The fascia was closed with a #1 barbed running suture.  The deep tissues were closed with Vicryl sutures the subcutaneous tissues were closed with interrupted Vicryl sutures and a running barbed 4-0 suture.  The skin was then reinforced with Dermabond and a sterile dressing was placed.   The patient was awoken from anesthesia transferred off of the operating room table onto a hospital bed where examination of leg lengths found the leg lengths to be equal with a good distal pulse.  The patient was then transferred to the PACU in stable condition.

## 2024-05-10 NOTE — Anesthesia Procedure Notes (Signed)
 Procedure Name: Intubation Date/Time: 05/10/2024 10:34 AM  Performed by: Colon Morna SQUIBB, RNPre-anesthesia Checklist: Patient identified, Patient being monitored, Timeout performed, Emergency Drugs available and Suction available Patient Re-evaluated:Patient Re-evaluated prior to induction Oxygen Delivery Method: Circle system utilized Preoxygenation: Pre-oxygenation with 100% oxygen Induction Type: IV induction Ventilation: Mask ventilation without difficulty Laryngoscope Size: Mac, 3 and McGrath Grade View: Grade I Tube type: Oral Tube size: 7.0 mm Number of attempts: 1 Airway Equipment and Method: Stylet Placement Confirmation: ETT inserted through vocal cords under direct vision, positive ETCO2 and breath sounds checked- equal and bilateral Secured at: 21 cm Tube secured with: Tape Dental Injury: Teeth and Oropharynx as per pre-operative assessment

## 2024-05-10 NOTE — H&P (Signed)
 History of Present Illness: [name] Rowe is an 64 y.o. female presents for history and physical for right anterior total hip arthroplasty with Dr. Lorelle on 05/10/2024. Patient has had greater than a year and a half of right hip pain located in her groin. She underwent a hip MRI showing impingement, labral tear with some arthritic changes. A cortisone injection into her right hip joint provided her with excellent relief but only for a few days. She was evaluated by arthroscopic hip surgeon who recommended total hip arthroplasty due to severity of changes throughout the right hip joint. Patient has a hard time walking short distances. She ambulates with no assistive devices but has been told by numerous people she needs a cane. She has had no relief with anti-inflammatory medications, activity modification. Pain is severe and interferes with her quality of life and activities daily living. Pain is 8 out of 10 and located in her right groin and anterior thigh.  Patient is a non-smoker nondiabetic with an A1c of 5.8 and a BMI of 22.8. Patient is a Scientist, [employer].  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  Depression 2001  Diabetes mellitus without complication (CMS/HHS-HCC) 2025  Pre-diabetes  Diarrhea 2023  Dyslipidemia  Essential hypertension  Fibromuscular dysplasia ()  Fibromyalgia  GERD (gastroesophageal reflux disease)  Heart disease  Hematologic abnormality 2009  Heart attack currently baby aspirin   Hematuria, microscopic 2023  HNP (herniated nucleus pulposus), lumbar 2014  Hyperlipidemia  Hypothyroidism 2011  Loss of  consciousness (CMS/HHS-HCC) 2023  Lumbar radicular syndrome  Migraine without aura and without status migrainosus, not intractable 2023  Myocardial infarct (CMS/HHS-HCC) 2009  Myocardial infarction (CMS/HHS-HCC)  Numbness and tingling 2023  Paresthesias 2022  Persistent proteinuria 2023  Prediabetes 2023  Psychophysiological insomnia 2020  Renal artery stenosis 2021  Seasonal asthma (HHS-HCC)  Seizures (CMS/HHS-HCC) 2021  After my 2nd stroke having some activity on my brain that look like seizures  Snores  Spinal enthesopathy 2021  Stroke (CMS/HHS-HCC)  Stroke (CMS/HHS-HCC)  2006, 2021  Thyroid  disease 2003  Graves Disease  Vitamin D  insufficiency 2014   Past Surgical History: Past Surgical History:  Procedure Laterality Date  LUMBAR LAMINECTOMY/DECOMPRESSION MICRODISCECTOMY 1 LEVEL 05/25/2013  Dr. Gillie at [facility]  Left Heart Cath and Coronary Angiography 02/03/2015  LEFT HEART CATHETERIZATION 07/31/2019  BACK SURGERY 2010 2011 2014  BACK SURGERY  LUMBAR LAMINECTOMY X2  BILATERAL HIP ARTHROSCOPY Bilateral  BUNIONECTOMY Bilateral  COLONOSCOPY  CORONARY ANGIOPLASTY 2021  Stent  ESOPHAGOGASTRODUODENOSCOPY, FLEXIBLE, TRANSORAL FOR BARIATRIC BALLOON ADJUSTMENT  HYSTERECTOMY VAGINAL  KNEE ARTHROSCOPY 2008  OTHER SURGICAL HISTORY  LUMBAR FUSION  OTHER SURGICAL HISTORY  (iodine therapy); RADIOACTIVE FOR GRAVES DISEASE  OTHER SURGICAL HISTORY  (kidney stent); PERMANENT RIGHT KIDNEY  OTHER SURGICAL HISTORY  (LEFT HEART CATHETERIZATION); performed in [place]  patient reports cardiac stent placement  SPINE SURGERY 2010  3 surgeries   Past Family History: Family History  Problem Relation Age of Onset  Asthma Mother  Coronary Artery Disease (Blocked arteries around heart) Mother  Hyperlipidemia (Elevated cholesterol) Mother  High blood pressure (Hypertension) Mother  Osteoarthritis Mother  Stroke Mother  Heart disease Mother  Coronary Artery Disease (Blocked  arteries around heart) Father  High blood pressure (Hypertension) Father  Prostate cancer Father  Stroke Father  Heart disease Father  Hyperlipidemia (Elevated cholesterol) Son  Hyperlipidemia (Elevated cholesterol) Sister  High blood pressure (Hypertension) Sister  Hyperlipidemia (Elevated cholesterol) Brother  High blood pressure (Hypertension) Brother  Prostate cancer Brother  High blood pressure (Hypertension) Brother  Stroke Brother  My brother died from a massive stroke at 5  Osteoarthritis Sister  Coronary Artery Disease (Blocked arteries around heart) Maternal Aunt  Thyroid  disease Maternal Aunt  Heart disease Maternal Aunt  Coronary Artery Disease (Blocked arteries around heart) Maternal Aunt  Heart disease Maternal Aunt  Coronary Artery Disease (Blocked arteries around heart) Maternal Aunt  Heart disease Maternal Aunt  Kidney disease Maternal Aunt  Coronary Artery Disease (Blocked arteries around heart) Maternal Uncle  Heart disease Maternal Uncle  Coronary Artery Disease (Blocked arteries around heart) Paternal Aunt  Diabetes type II Paternal Aunt  Heart disease Paternal Aunt  Coronary Artery Disease (Blocked arteries around heart) Paternal Aunt  Heart disease Paternal Aunt  Kidney disease Paternal Aunt  Coronary Artery Disease (Blocked arteries around heart) Paternal Uncle  Heart disease Paternal Uncle  Coronary Artery Disease (Blocked arteries around heart) Paternal Uncle  Heart disease Paternal Uncle   Medications: Current Outpatient Medications  Medication Sig Dispense Refill  amLODIPine  (NORVASC ) 5 MG tablet  aspirin  81 MG EC tablet Take 81 mg by mouth once daily  DULoxetine  (CYMBALTA ) 30 MG DR capsule Take 1 capsule (30 mg total) by mouth once daily 30 capsule 11  ezetimibe  (ZETIA ) 10 mg tablet  hydrOXYzine  (ATARAX ) 25 MG tablet TAKE 1 TABLET(25 MG) BY MOUTH THREE TIMES DAILY AS NEEDED FOR ITCHING 30 tablet 1  levothyroxine  (SYNTHROID ) 125 MCG tablet   LINZESS  290 mcg capsule Take 290 mcg by mouth as needed (Taking every 3 days or more often)  metoprolol  SUCCinate (TOPROL -XL) 100 MG XL tablet  nitroGLYcerin  (NITROSTAT ) 0.4 MG SL tablet  pantoprazole  (PROTONIX ) 20 MG DR tablet Take 1 tablet (20 mg total) by mouth once daily 30 tablet 11  promethazine (PHENERGAN) 25 MG tablet Take 25 mg by mouth every 6 (six) hours as needed for Nausea  rosuvastatin  (CRESTOR ) 40 MG tablet Take 1 tablet (40 mg total) by mouth once daily 90 tablet 3  topiramate  (TOPAMAX ) 50 MG tablet Take 150mg  nighly 270 tablet 1  venlafaxine (EFFEXOR-XR) 75 MG XR capsule Take 75 mg by mouth once daily  ALBUTEROL  INHAL Inhale 90 mcg into the lungs (Patient not taking: Reported on 04/17/2024)  amoxicillin -clavulanate (AUGMENTIN) 875-125 mg tablet Take 1 tablet (875 mg total) by mouth 2 (two) times daily for 10 days 20 tablet 0  ergocalciferol , vitamin D2, 1,250 mcg (50,000 unit) capsule Take 50,000 Units by mouth once a week (Patient not taking: Reported on 04/17/2024)  gabapentin  (NEURONTIN ) 100 MG capsule Take 100 mg by mouth 3 (three) times daily (Patient not taking: Reported on 04/17/2024)  galcanezumab-gnlm (EMGALITY SYRINGE) 120 mg/mL Syrg Inject 1 mL subcutaneously monthly (Patient not taking: Reported on 04/17/2024) 1 mL 5  levocetirizine (XYZAL) 5 MG tablet Take 2.5 mg by mouth every evening (Patient not taking: Reported on 04/17/2024)  oxyCODONE -acetaminophen  (PERCOCET) 5-325 mg tablet Take 1 tablet by mouth at bedtime (Patient not taking: Reported on 04/17/2024)   No current facility-administered medications for this visit.   Allergies: Allergies  Allergen Reactions  Atorvastatin  Muscle Pain  Compazine [Prochlorperazine] Other (See Comments)  Seizures  Dilaudid [Hydromorphone] Anxiety  Imitrex [Sumatriptan] Other (See Comments)  Chest  Pain  Tramadol  Anxiety and Other (See Comments)  Chest Pain  Lisinopril Itching    Visit Vitals: Vitals:  04/25/24 0956   BP: 124/78    Review of Systems:  A comprehensive 14 point ROS was performed, reviewed, and the pertinent orthopaedic findings are documented in the HPI.  Physical Exam: Body mass index is 22.21 kg/m. General:  Well developed, well nourished, no apparent distress, normal affect, antalgic gait with no assistive devices.  HEENT: Head normocephalic, atraumatic, PERRL.   Abdomen: Soft, non tender, non distended, Bowel sounds present.  Heart: Examination of the heart reveals regular, rate, and rhythm. There is no murmur noted on ascultation. There is a normal apical pulse.  Lungs: Lungs are clear to auscultation. There is no wheeze, rhonchi, or crackles. There is normal expansion of bilateral chest walls.   Right hip exam  SKIN: intact SWELLING: none WARMTH: no warmth TENDERNESS: Stinchfield positive ROM: 10 degrees internal rotation and 40 degrees external rotation and pain with internal rotation localized to the groin and pain with external rotation localized to the right flank,; Hip Flexion 105 STRENGTH: normal GAIT: stiff-legged STABILITY: stable to testing CREPITUS: no LEG LENGTH DISCREPANCY: none NEUROLOGICAL EXAM: normal VASCULAR EXAM: normal LUMBAR SPINE: tenderness: no straight leg raising sign: no motor exam: normal  The contralateral hip was examined for comparison and it showed: TENDERNESS: none ROM: normal and full STRENGTH: normal STABILITY: stable to testing  Right hip MRI shows right hip joint cartilage thinning throughout the femoral head and acetabulum significantly medially and inferiorly no full-thickness defects noted there is labral degeneration especially anteriorly and superiorly no obvious full-thickness tear of the labrum. Mild pincer style morphology to the acetabulum. Mild to moderate degenerative changes of the right hip. No fractures or dislocations or lesions noted.  Hip Imaging :  I reviewed AP pelvis and lateral hip x-rays and false  profiles of the right hip performed on 02/09/2024 images reviewed myself. No fractures dislocations noted. Mild to moderate degenerative changes with some central wear in both hips. Some posterior joint space narrowing as well. Mild cam deformity of the right hip. There is also some sclerosis of superior acetabular dome grossly unchanged from prior films.  Assessment:  Right hip osteoarthritis, right hip labral tear  Plan: Avalynne is a 63 year old female who presents for history and physical for right anterior total hip arthroplasty with Dr. Lorelle on 05/10/2024. Patient has had greater than 1.5 years of right hip, groin pain that is moderate to severe and interfering with her quality of life and activities daily living. She had MRI showing arthritic changes labral tear with FAI. A cortisone injection provided her with only short-term relief and arthroscopic hip surgeon felt as if a total hip arthroplasty would be best for her based on the severity of cartilage loss. Her pain interferes with her quality of life and activities daily living. She has a hard time ambulating short distances. Risks, benefits, complications of a right anterior total hip arthroplasty have been discussed with the patient. Patient has agreed and consented to the procedure with Dr. Lorelle on 05/10/2024.  The hospitalization and post-operative care and rehabilitation were also discussed. The use of perioperative antibiotics and DVT prophylaxis were discussed. The risk, benefits and alternatives to a surgical intervention were discussed at length with the patient. The patient was also advised of risks related to the medical comorbidities and elevated body mass index (BMI). A lengthy discussion took place to review the most common complications including but not limited  to: deep vein thrombosis, pulmonary embolus, heart attack, stroke, infection, wound breakdown, heterotopic ossification, dislocation, numbness, leg length in-equality,  intraoperative fracture, damage to nerves, tendon,muscles, arteries or other blood vessels, death and other possible complications from anesthesia. The patient was told that we will take steps to minimize these risks by using sterile technique, antibiotics and DVT prophylaxis when appropriate and follow the patient postoperatively in the office setting to monitor progress. The possibility of recurrent pain, no improvement in pain and actual worsening of pain were also discussed with the patient. The risk of dislocation following total hip replacement was discussed and potential precautions to prevent dislocation were reviewed.    All questions answered patient agrees with above plan for right anterior total hip arthroplasty.

## 2024-05-11 ENCOUNTER — Other Ambulatory Visit: Payer: Self-pay

## 2024-05-11 ENCOUNTER — Encounter: Payer: Self-pay | Admitting: Orthopedic Surgery

## 2024-05-11 LAB — CBC
HCT: 32.8 % — ABNORMAL LOW (ref 36.0–46.0)
Hemoglobin: 11.7 g/dL — ABNORMAL LOW (ref 12.0–15.0)
MCH: 31 pg (ref 26.0–34.0)
MCHC: 35.7 g/dL (ref 30.0–36.0)
MCV: 86.8 fL (ref 80.0–100.0)
Platelets: 240 K/uL (ref 150–400)
RBC: 3.78 MIL/uL — ABNORMAL LOW (ref 3.87–5.11)
RDW: 12.8 % (ref 11.5–15.5)
WBC: 11.9 K/uL — ABNORMAL HIGH (ref 4.0–10.5)
nRBC: 0 % (ref 0.0–0.2)

## 2024-05-11 LAB — BASIC METABOLIC PANEL WITH GFR
Anion gap: 9 (ref 5–15)
BUN: 14 mg/dL (ref 8–23)
CO2: 19 mmol/L — ABNORMAL LOW (ref 22–32)
Calcium: 8.2 mg/dL — ABNORMAL LOW (ref 8.9–10.3)
Chloride: 108 mmol/L (ref 98–111)
Creatinine, Ser: 0.83 mg/dL (ref 0.44–1.00)
GFR, Estimated: >60 mL/min (ref >60)
Glucose, Bld: 126 mg/dL — ABNORMAL HIGH (ref 70–99)
Potassium: 4.3 mmol/L (ref 3.5–5.1)
Sodium: 136 mmol/L (ref 135–145)

## 2024-05-11 MED ORDER — EZETIMIBE 10 MG PO TABS
ORAL_TABLET | ORAL | Status: AC
  Filled 2024-05-11: qty 1

## 2024-05-11 NOTE — Progress Notes (Signed)
 Per PA Chris hold amlodipine . Pt's BP is 107/65 and HR is 78.

## 2024-05-11 NOTE — Evaluation (Signed)
 Physical Therapy Evaluation Patient Details Name: [name] Rowe MRN: [id number] DOB: 03-26-1961 Today's Date: 05/11/2024  History of Present Illness  [name] Rowe is a 63 y.o. female admitted for elective R THA performed on 05/10/24 by Dr. Lorelle. PMH significant of CKD, CVA, DM, HTN, HLD, GERD, MI, seizures.  Clinical Impression  Pt is a 63 y.o. female admitted for elective R THA performed on 05/10/24 by Dr. Lorelle. Pt was received in supine and was agreeable to therapy. Pt reports minimal pain/discomfort at start of session. Bed mobility performed with increased time but otherwise no need for vc or physical assist. Pt completed STS and amb with RW all with supervision. She ambulated 150 ft to stairs and back with cues to increased step length on L. She was instructed on how to ascend/descend stairs sideways d/t only being able to reach 1 railing at a time at home. She performed stairs with CGA for safety and cues for wide steps to ensure room for both feet with step-to. Pt returned to her recliner with breakfast ready and all needs met. Edu on HEP and frequency. She will continue to benefit from skilled therapy services to address her post-operative reduction in strength and functional mobility.      If plan is discharge home, recommend the following: A little help with walking and/or transfers   Can travel by private vehicle        Equipment Recommendations Rolling walker (2 wheels);BSC/3in1  Recommendations for Other Services       Functional Status Assessment Patient has had a recent decline in their functional status and demonstrates the ability to make significant improvements in function in a reasonable and predictable amount of time.     Precautions / Restrictions Precautions Precautions: Anterior Hip Precaution Booklet Issued: Yes (comment) Recall of Precautions/Restrictions: Intact Restrictions Weight Bearing Restrictions Per Provider Order: Yes RLE Weight Bearing Per  Provider Order: Weight bearing as tolerated      Mobility  Bed Mobility Overal bed mobility: Modified Independent             General bed mobility comments: increased time    Transfers Overall transfer level: Needs assistance Equipment used: Rolling walker (2 wheels) Transfers: Sit to/from Stand Sit to Stand: Supervision           General transfer comment: no vc or physical assist needed    Ambulation/Gait Ambulation/Gait assistance: Supervision Gait Distance (Feet): 300 Feet Assistive device: Rolling walker (2 wheels) Gait Pattern/deviations: Step-through pattern, Decreased step length - left, Decreased stance time - right, Decreased stride length       General Gait Details: able to adjust L step length with cuing  Stairs Stairs: Yes Stairs assistance: Contact guard assist Stair Management: One rail Right, Step to pattern, Sideways Number of Stairs: 5 General stair comments: performed sideways to simulate home environment; cues to make room for both feet on each step  Wheelchair Mobility     Tilt Bed    Modified Rankin (Stroke Patients Only)       Balance Overall balance assessment: Needs assistance Sitting-balance support: Feet supported, No upper extremity supported Sitting balance-Leahy Scale: Good     Standing balance support: Bilateral upper extremity supported, During functional activity Standing balance-Leahy Scale: Fair                               Pertinent Vitals/Pain Pain Assessment Pain Assessment: Faces Faces Pain Scale: Hurts a little bit Pain  Location: R hip Pain Descriptors / Indicators: Discomfort Pain Intervention(s): Monitored during session, Premedicated before session    Home Living Family/patient expects to be discharged to:: Private residence Living Arrangements: Alone Available Help at Discharge: Family Type of Home: Apartment Home Access: Stairs to enter Entrance Stairs-Rails: Conservation Officer, Historic Buildings of Steps: 3   Home Layout: One level Home Equipment: None      Prior Function Prior Level of Function : Independent/Modified Independent             Mobility Comments: IND at baseline ADLs Comments: IND at baseline     Extremity/Trunk Assessment   Upper Extremity Assessment Upper Extremity Assessment: Overall WFL for tasks assessed    Lower Extremity Assessment Lower Extremity Assessment: RLE deficits/detail RLE Deficits / Details: s/p R THA 05/10/24 WBAT    Cervical / Trunk Assessment Cervical / Trunk Assessment: Normal  Communication   Communication Communication: No apparent difficulties    Cognition Arousal: Alert Behavior During Therapy: WFL for tasks assessed/performed   PT - Cognitive impairments: No apparent impairments                         Following commands: Intact       Cueing Cueing Techniques: Verbal cues     General Comments General comments (skin integrity, edema, etc.): edu on HEP, DME safety, gait, and stairs    Exercises     Assessment/Plan    PT Assessment Patient needs continued PT services  PT Problem List Decreased strength;Decreased range of motion;Decreased activity tolerance;Decreased balance;Decreased mobility;Decreased coordination       PT Treatment Interventions Gait training;Stair training;Therapeutic activities;Functional mobility training;Therapeutic exercise;Balance training    PT Goals (Current goals can be found in the Care Plan section)  Acute Rehab PT Goals Patient Stated Goal: to return home PT Goal Formulation: With patient Time For Goal Achievement: 05/25/24 Potential to Achieve Goals: Good    Frequency BID     Co-evaluation               AM-PAC PT 6 Clicks Mobility  Outcome Measure Help needed turning from your back to your side while in a flat bed without using bedrails?: None Help needed moving from lying on your back to sitting on the side of a flat bed without  using bedrails?: None Help needed moving to and from a bed to a chair (including a wheelchair)?: None Help needed standing up from a chair using your arms (e.g., wheelchair or bedside chair)?: None Help needed to walk in hospital room?: A Little Help needed climbing 3-5 steps with a railing? : A Little 6 Click Score: 22    End of Session Equipment Utilized During Treatment: Gait belt Activity Tolerance: Patient tolerated treatment well Patient left: in chair;with call bell/phone within reach;with chair alarm set Nurse Communication: Mobility status PT Visit Diagnosis: Other abnormalities of gait and mobility (R26.89);Difficulty in walking, not elsewhere classified (R26.2)    Time: 9151-9088 PT Time Calculation (min) (ACUTE ONLY): 23 min   Charges:                 Allena Bulls, SPT   Allena Bulls 05/11/2024, 10:01 AM

## 2024-05-11 NOTE — TOC Initial Note (Addendum)
 Transition of Care [facility]) - Initial/Assessment Note    Patient Details  Name: [name] Rowe MRN: [id number] Date of Birth: 1960/12/08  Transition of [facility]) CM/SW Contact:    Jensen Cheramie L Idella Lamontagne, LCSW Phone Number: 05/11/2024, 9:12 AM  Clinical Narrative:                  Consult received for DME. BSC ordered through [employer]. DME will be delivered to the patients room.     Home Health services was pre-arranged with [facility] by the surgeon's. office.      Patient Goals and CMS Choice            Expected Discharge Plan and Services         Expected Discharge Date: 05/11/24                                    Prior Living Arrangements/Services                       Activities of Daily Living   ADL Screening (condition at time of admission) Independently performs ADLs?: Yes (appropriate for developmental age) Is the patient deaf or have difficulty hearing?: Yes Does the patient have difficulty seeing, even when wearing glasses/contacts?: No Does the patient have difficulty concentrating, remembering, or making decisions?: Yes  Permission Sought/Granted                  Emotional Assessment              Admission diagnosis:  Primary osteoarthritis of right hip [M16.11] Femoroacetabular impingement of right hip [M25.851] 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 [M24.151] S/P total right hip arthroplasty [Z96.641] Patient Active Problem List   Diagnosis Date Noted   S/P total right hip arthroplasty 05/10/2024   CAD (coronary artery disease) 06/19/2018   At risk for sleep apnea 05/04/2017   Tachycardia 04/05/2017   Poor sleep pattern 10/20/2016   Fatigue 04/07/2016   Neck pain on left side 03/16/2016   Acute left-sided thoracic back pain 03/16/2016   Pain of left breast 10/30/2015   Lichen sclerosus 08/15/2014   Extremity edema 06/24/2014   Diplopia    History of coronary vasospasm 04/12/2014   Bursitis, trochanteric  11/22/2013   Essential hypertension 11/19/2013   Acute low back pain 08/10/2013   History of colonic polyps 02/23/2013   HNP (herniated nucleus pulposus), lumbar 12/01/2012   Lumbar radicular syndrome 12/01/2012   Seasonal allergies 11/30/2012   Intervertebral disc protrusion 11/06/2012   Situational anxiety 09/05/2012   Vitamin D  insufficiency 06/30/2012   Unilateral hearing loss 06/30/2012   Vestibular dizziness 06/30/2012   Routine adult health maintenance 03/31/2012   Fibromuscular dysplasia (HCC) 08/13/2011   Dizziness and palpitations, episodic 07/06/2011   Poorly-controlled hypertension    History of CVA (cerebrovascular accident)    Hypothyroidism 04/10/2010   Dyslipidemia 04/10/2010   Anxiety 04/10/2010   Depression 04/10/2010   Migraine headache 04/10/2010   Renal artery stenosis (HCC) 04/10/2010   Fibromyalgia 04/10/2010   Status post lumbar surgery 04/10/2010   Backache 04/09/2010   PCP:  Roxanna Rocks, PA Pharmacy:   [id number] [place], [place] - [address] AT [facility] OF [address] [place] [address] Phone: [phone] Fax: [phone]  [facility] [address] [place]  [address] Phone: [phone] Fax: [phone]     Social Drivers of Health (SDOH) Social History: SDOH Screenings   Food Insecurity: No Food Insecurity (05/10/2024)  Recent Concern: Food Insecurity - Food Insecurity Present (02/16/2024)   Received [facility]  Housing: Low Risk  (05/10/2024)  Transportation Needs: No Transportation Needs (05/10/2024)  Utilities: Not At Risk (05/10/2024)  Financial Resource Strain: Low Risk  (04/25/2024)   Received from [facility]  Recent Concern: Financial Resource Strain - Medium Risk (02/16/2024)   Received [facility]  Physical Activity: Inactive (12/07/2021)   Received  from [facility], [facility] visits prior to 08/07/2022.  Social Connections: Moderately Integrated (12/07/2021)   [facility], [facility] visits prior to 08/07/2022.  Stress: Stress Concern Present (12/07/2021)   [facility], [facility] visits prior to 08/07/2022.  Tobacco Use: Low Risk  (05/10/2024)   SDOH Interventions:     Readmission Risk Interventions     No data to display

## 2024-05-11 NOTE — Progress Notes (Signed)
 DISCHARGE NOTE:   Pt dc with IV removed and dc instructions given. Pt received a RW, 3 in 1, and medications delivered to hospital room. Pt educated on administering Lovenox  injection at home and how to dispose of the needle. Pt has both TED hose on and in place. Pt voices no question or concerns at this time. Pt wheeled down to medical mall entrance by staff. Pt's friend provided transportation.

## 2024-05-11 NOTE — Progress Notes (Signed)
 Subjective: 1 Day Post-Op Procedure(s) (LRB): ARTHROPLASTY, HIP, TOTAL, ANTERIOR APPROACH (Right) Patient reports pain as mild.   Patient is well, and has had no acute complaints or problems Denies any CP, SOB, ABD pain. We will continue therapy today.  Plan is to go Home after hospital stay.  Objective: Vital signs in last 24 hours: Temp:  [97 F (36.1 C)-98.1 F (36.7 C)] 98.1 F (36.7 C) (12/05 0809) Pulse Rate:  [50-79] 68 (12/05 0809) Resp:  [11-26] 15 (12/05 0809) BP: (88-126)/(43-84) 107/65 (12/05 0809) SpO2:  [97 %-100 %] 100 % (12/05 0809) Weight:  [57.9 kg] 57.9 kg (12/04 0900)  Intake/Output from previous day: 12/04 0701 - 12/05 0700 In: 2668.2 [P.O.:360; I.V.:2108.2; IV Piggyback:200] Out: 270 [Urine:120; Blood:150] Intake/Output this shift: No intake/output data recorded.  Recent Labs    05/10/24 0931 05/11/24 0735  HGB 13.6 11.7*   Recent Labs    05/10/24 0931 05/11/24 0735  WBC  --  11.9*  RBC  --  3.78*  HCT 40.0 32.8*  PLT  --  240   Recent Labs    05/10/24 0931 05/11/24 0735  NA 140 136  K 4.3 4.3  CL 107 108  CO2  --  19*  BUN 14 14  CREATININE 1.00 0.83  GLUCOSE 86 126*  CALCIUM   --  8.2*   No results for input(s): LABPT, INR in the last 72 hours.  EXAM General - Patient is Alert, Appropriate, and Oriented Extremity - Neurovascular intact Sensation intact distally Intact pulses distally Dorsiflexion/Plantar flexion intact Dressing - dressing C/D/I and no drainage Motor Function - intact, moving foot and toes well on exam.   Past Medical History:  Diagnosis Date   Adenomyosis    Anginal pain    Anxiety 2001   Chest pain    a. No angiographic CAD by cath 2009 in [place]. There was catheter-induced RCA vasospasm versus microvascular obstruction. b. Lexiscan  myoview  04/2010 - EF 79%, normal perfusion.;  c. ETT-Myoview  7/13: no ischemia, EF 77%, submax exercise   Chronic back pain    Chronic kidney disease     Complication of anesthesia    a.) postoperative micturitional difficulties; b.) CVA following epidural in 2021   Coronary artery disease of native artery of native heart with stable angina pectoris    Coronary vasospasm    CVA (cerebral vascular accident) (HCC) 2006   lost hearing on left and 2021   Depression 2001   Diastolic dysfunction    Fibromuscular dysplasia 2009   a. Renal artery stenosis due to this. b. Carotid dopplers 07/2011 c/w FMD but no significant stenosis   Fibromyalgia 1999   H/O Graves' disease 2003   s/p radio-iodine treatment in 2003    Hearing loss of left ear    s/p CVA 2006   History of stent insertion of RIGHT renal artery 2009   HLD (hyperlipidemia) 2000   HTN (hypertension) 1988   a. Likely related to RAS; urinary metanephrines and plasma renin/aldosterone ratio normal. b. H/o HTN urgency 06/2011 after being off BP meds for a number of days   Hypertensive crisis 06/29/2011   Hypothyroidism 2003   Graves disease s/p radio-iodine treatment in 2003    Inappropriate sinus tachycardia 2000   10/27/10-Holter -NSR with occ sinus tachy-rare PACs, no sig arrhythmia   Irritable bowel syndrome    Leiomyoma    Long-term use of aspirin  therapy    Migraine headache    Multiple sclerosis with acute neurologic event  Myocardial infarction [facility]) 2009   Personal history of colonic polyps-sessile serrated adenoma 02/23/2013   Pre-diabetes    Renal artery stenosis    a. Due to fibromuscular dysplasia. b. Renal stent 2009. c. Studies:  CTA abdomen (1/13) with evidence for fibromuscular dysplasia, right renal artery stent appears patent. Renal artery dopplers (2/13) with FMD but no evidence for signficant stenosis.    Unilateral hearing loss     Assessment/Plan:   1 Day Post-Op Procedure(s) (LRB): ARTHROPLASTY, HIP, TOTAL, ANTERIOR APPROACH (Right) Principal Problem:   S/P total right hip arthroplasty  Estimated body mass index is 21.91 kg/m as calculated from the  following:   Height as of this encounter: 5' 4 (1.626 m).   Weight as of this encounter: 57.9 kg. Advance diet Up with therapy Pain well controlled Labs and VSS CM to assist with discharge to home with HHPT today  DVT Prophylaxis - Lovenox , TED hose, and SCDS Weight-Bearing as tolerated to right leg   T. Medford Amber, PA-C [facility] 05/11/2024, 8:30 AM

## 2024-05-11 NOTE — Plan of Care (Signed)
  Problem: Activity: Goal: Ability to tolerate increased activity will improve Outcome: Progressing   Problem: Pain Management: Goal: Pain level will decrease with appropriate interventions Outcome: Progressing   

## 2024-05-11 NOTE — Plan of Care (Signed)
?  Problem: Education: ?Goal: Knowledge of the prescribed therapeutic regimen will improve ?Outcome: Progressing ?  ?Problem: Activity: ?Goal: Ability to avoid complications of mobility impairment will improve ?Outcome: Progressing ?  ?Problem: Pain Management: ?Goal: Pain level will decrease with appropriate interventions ?Outcome: Progressing ?  ?Problem: Skin Integrity: ?Goal: Will show signs of wound healing ?Outcome: Progressing ?  ?

## 2024-05-18 ENCOUNTER — Other Ambulatory Visit: Payer: Self-pay

## 2024-05-28 ENCOUNTER — Other Ambulatory Visit: Payer: Self-pay

## 2024-06-08 NOTE — Progress Notes (Signed)
 "   06/11/2024 [name] Rowe [id number] [birth year]  Gastroenterology Office Note    Referring Provider: Roxanna Rocks, [place] Primary Care Physician:  Roxanna Rocks, [place]  Primary GI Provider: Jinny Carmine, MD    Chief Complaint   Chief Complaint  Patient presents with   New Patient (Initial Visit)    Seen in ED -diarrhea x 1 week-pancolitis-completed full course of ABX-diarrhea has resolved- lost 13 pounds  has abdominal tenderness- watching her diet     History of Present Illness   [name] Rowe is a 64 y.o. female with PMHX of HTN, CAD, anxiety, depression presenting today at the request of Minor, Mason, PA due to diarrhea and pancolitis.   Discussed the use of AI scribe software for clinical note transcription with the patient, who gave verbal consent to proceed.  In early November 2025, she developed acute onset diarrhea occurring six to seven times daily, associated with chills, weakness, and malaise. Initial evaluation by her primary care provider was followed by urgent care visits, where she was diagnosed with diverticulitis and started on antibiotics. Despite treatment, diarrhea persisted, leading to an emergency room visit where CT imaging revealed pancolitis. Antibiotics were changed and antidiarrheal medication was prescribed, but symptoms continued. She subsequently developed vomiting and hypokalemia, requiring intravenous fluids and potassium supplementation. Multiple antibiotics were administered over this period, with adjustments based on response.  By early December 2025, diarrhea persisted and she experienced significant weight loss, losing thirteen pounds due to a restricted diet of soup, white rice, and boiled eggs. Diarrhea resolved just prior to her right hip replacement surgery on May 10, 2024, after completing antibiotics and antidiarrheal medication. Since then, bowel movements have normalized to one every one to two days, though stool remains soft and less formed  than prior to illness. She denies current diarrhea, hematochezia, or vomiting. Appetite remains poor, and she continues to restrict her diet to low-fiber foods, avoiding beans, salads, and fried foods due to fear of recurrence of diarrhea.  She reports mild left lower abdominal tenderness, described as a pinching sensation, particularly when lying on her side. No current fever or chills. She is not taking medication for pain and rates the discomfort as mild.  History includes intermittent diarrhea in 2023, attributed to stress, with normal colonoscopy and upper endoscopy including random biopsies. Stool studies during her recent illness were negative for C. difficile and other infectious etiologies.  Patient seen by PCP on 04/19/2024 with diarrhea and left lower quadrant pain and treated for diverticulitis with Augmentin.  No stool burden noted on abdominal x-ray  04/23/2024 CT Abd pelvis CT findings compatible with pancolitis and ongoing diarrhea. 2. No other acute intra-abdominal or pelvic finding. 3. Aortic atherosclerosis.  Patient seen by PCP on 04/19/2024 with diarrhea and left lower quadrant pain and treated for diverticulitis with Augmentin.  No stool burden noted on abdominal x-ray.  05/14/2022 Colonoscopy and EGD -The Terminal Ileum was intubated for 10 cm and was normal  All examined segments of the colon appeared normal.  -Random colon biopsies were done from the whole colon to rule out  microscopic colitis  - Medium Internal hemorrhoids were seen on rectal retroflection.   -The esophageal mucosa appeared normal.  - The gastric mucosa appeared normal.  - The first and second part of the duodenum appeared normal. Biopsies were done from the duodenal bulb and second part to rule out Celiac disease.   Patient seen by [facility] June 2023 with weight loss, diarrhea, black stools, abdominal  pain and recommended EGD/colonoscopy.  06/06/2018 colonoscopy - 1 diminutive polyp in  the transverse colon, removed with a cold snare.  Resected and retrieved. -Exam was otherwise normal - Personal history of colonic polyp sessile serrated polyp 2014.   Past Medical History:  Diagnosis Date   Adenomyosis    Anginal pain    Anxiety 2001   Chest pain    a. No angiographic CAD by cath 2009 in [place]. There was catheter-induced RCA vasospasm versus microvascular obstruction. b. Lexiscan  myoview  04/2010 - EF 79%, normal perfusion.;  c. ETT-Myoview  7/13: no ischemia, EF 77%, submax exercise   Chronic back pain    Chronic kidney disease    Complication of anesthesia    a.) postoperative micturitional difficulties; b.) CVA following epidural in 2021   Coronary artery disease of native artery of native heart with stable angina pectoris    Coronary vasospasm    CVA (cerebral vascular accident) (HCC) 2006   lost hearing on left and 2021   Depression 2001   Diastolic dysfunction    Fibromuscular dysplasia 2009   a. Renal artery stenosis due to this. b. Carotid dopplers 07/2011 c/w FMD but no significant stenosis   Fibromyalgia 1999   H/O Graves' disease 2003   s/p radio-iodine treatment in 2003    Hearing loss of left ear    s/p CVA 2006   History of stent insertion of RIGHT renal artery 2009   HLD (hyperlipidemia) 2000   HTN (hypertension) 1988   a. Likely related to RAS; urinary metanephrines and plasma renin/aldosterone ratio normal. b. H/o HTN urgency 06/2011 after being off BP meds for a number of days   Hypertensive crisis 06/29/2011   Hypothyroidism 2003   Graves disease s/p radio-iodine treatment in 2003    Inappropriate sinus tachycardia 2000   10/27/10-Holter -NSR with occ sinus tachy-rare PACs, no sig arrhythmia   Irritable bowel syndrome    Leiomyoma    Long-term use of aspirin  therapy    Migraine headache    Multiple sclerosis with acute neurologic event    Myocardial infarction [facility]) 2009   Personal history of colonic polyps-sessile serrated adenoma  02/23/2013   Pre-diabetes    Renal artery stenosis    a. Due to fibromuscular dysplasia. b. Renal stent 2009. c. Studies:  CTA abdomen (1/13) with evidence for fibromuscular dysplasia, right renal artery stent appears patent. Renal artery dopplers (2/13) with FMD but no evidence for signficant stenosis.    Unilateral hearing loss     Past Surgical History:  Procedure Laterality Date   BACK SURGERY  2010 and 2011   In CT.  11/19/2008 (possibly a laminectomy) and in May of 2011 she underwent a L2-L3 fusion.   bunion removed  Bilateral    cardiac cath     CARDIAC CATHETERIZATION  2009   states it was normal   CARDIAC CATHETERIZATION N/A 02/03/2015   Procedure: Left Heart Cath and Coronary Angiography;  Surgeon: Ezra GORMAN Shuck, MD;  Location: [facility] INVASIVE CV LAB;  Service: Cardiovascular;  Laterality: N/A;   COLONOSCOPY  last 02/16/2013   COLONOSCOPY     HIP SURGERY Bilateral 1988 and 1989   scraped head of femur   KNEE ARTHROSCOPY Left    LUMBAR LAMINECTOMY/DECOMPRESSION MICRODISCECTOMY Left 05/25/2013   Procedure: LUMBAR LAMINECTOMY/DECOMPRESSION MICRODISCECTOMY 1 LEVEL;  Surgeon: Rockey LITTIE Peru, MD;  Location: [facility] NEURO ORS;  Service: Neurosurgery;  Laterality: Left;  LEFT L5S1 microdiskectomy   POLYPECTOMY     RENAL ARTERY STENT  2009   ROBOTIC ASSISTED LAP VAGINAL HYSTERECTOMY  05/13/2008   [facility] da Vinci hysterectomy  for myomatous uterus    SPINE SURGERY     TOTAL HIP ARTHROPLASTY Right 05/10/2024   Procedure: ARTHROPLASTY, HIP, TOTAL, ANTERIOR APPROACH;  Surgeon: Lorelle Hussar, MD;  Location: [facility] ORS;  Service: Orthopedics;  Laterality: Right;   VAGINAL HYSTERECTOMY      Current Outpatient Medications  Medication Sig Dispense Refill   acetaminophen  (TYLENOL ) 500 MG tablet Take 2 tablets (1,000 mg total) by mouth every 8 (eight) hours. 30 tablet 0   amLODipine  (NORVASC ) 5 MG tablet Take 1 tablet (5 mg total) by mouth daily. 90 tablet 3   aspirin  EC 81 MG tablet Take 81 mg  by mouth daily.     ciprofloxacin (CIPRO) 250 MG tablet Take 250 mg by mouth 2 (two) times daily.     docusate sodium  (COLACE) 100 MG capsule Take 1 capsule (100 mg total) by mouth 2 (two) times daily. 10 capsule 0   DULoxetine  (CYMBALTA ) 30 MG capsule Take 30 mg by mouth daily.     ezetimibe  (ZETIA ) 10 MG tablet Take 1 tablet (10 mg total) by mouth daily. 90 tablet 3   hydrOXYzine  (ATARAX ) 25 MG tablet Take 25 mg by mouth 3 (three) times daily as needed for itching.     levothyroxine  (SYNTHROID ) 125 MCG tablet Take 125 mcg by mouth daily before breakfast.     LINZESS  290 MCG CAPS capsule TAKE 1 CAPSULE BY MOUTH DAILY 30 capsule 2   metoprolol  succinate (TOPROL -XL) 100 MG 24 hr tablet Take 1 tablet (100 mg total) by mouth daily. Take with or immediately following a meal. 90 tablet 3   nitroGLYCERIN  (NITROSTAT ) 0.4 MG SL tablet Place 1 tablet (0.4 mg total) under the tongue every 5 (five) minutes as needed. x3 doses as needed for chest pain 25 tablet 3   ondansetron  (ZOFRAN ) 4 MG tablet Take 1 tablet (4 mg total) by mouth every 6 (six) hours as needed for nausea. 20 tablet 0   rosuvastatin  (CRESTOR ) 40 MG tablet Take 1 tablet (40 mg total) by mouth daily. 90 tablet 3   topiramate  (TOPAMAX ) 100 MG tablet Take 100 mg by mouth at bedtime.     topiramate  (TOPAMAX ) 50 MG tablet Take 50 mg by mouth at bedtime.     No current facility-administered medications for this visit.    Allergies as of 06/11/2024 - Review Complete 06/11/2024  Allergen Reaction Noted   Hydromorphone hcl Anxiety, Other (See Comments), Shortness Of Breath, and Swelling 04/07/2011   Prochlorperazine edisylate Other (See Comments)    Sumatriptan Palpitations    Atorvastatin  Other (See Comments) 04/09/2013   Lisinopril Cough 03/21/2023   Tramadol  Anxiety 06/20/2013    Family History  Problem Relation Age of Onset   Heart disease Mother    Hypertension Mother    Asthma Mother    Hyperlipidemia Mother    Stroke Mother     Heart disease Father    Hypertension Father    Prostate cancer Father    Heart attack Father    Hyperlipidemia Father    Stroke Father    Metabolic syndrome Sister    Depression Sister    Hypertension Sister    Diabetes Maternal Aunt    Stomach cancer Maternal Aunt    Hypertension Brother    Heart attack Brother 73   Early death Brother 33       massive stroke    Hyperlipidemia Brother  Stomach cancer Son    Breast cancer Other    Heart attack Other    Stroke Other    Colon cancer Neg Hx    Colon polyps Neg Hx    Esophageal cancer Neg Hx    Rectal cancer Neg Hx     Social History   Socioeconomic History   Marital status: Divorced    Spouse name: Not on file   Number of children: 2   Years of education: Not on file   Highest education level: Not on file  Occupational History   Occupation: worked in a museum/gallery conservator.    Employer: UNEMPLOYED    Comment: Quit about a yr ago because fo work related injury to left knee  Tobacco Use   Smoking status: Never   Smokeless tobacco: Never  Vaping Use   Vaping status: Never Used  Substance and Sexual Activity   Alcohol use: Not Currently    Comment: rarely   Drug use: No   Sexual activity: Not on file  Other Topics Concern   Not on file  Social History Narrative   Second Husband is currently in jail for an offense committed in [place] and he is awaiting extradition.   Lives alone.    Jehovah's witness.    Walks 20-30 minutes.                Social Drivers of Health   Tobacco Use: Low Risk (05/10/2024)   Patient History    Smoking Tobacco Use: Never    Smokeless Tobacco Use: Never    Passive Exposure: Not on file  Financial Resource Strain: Low Risk  (05/25/2024)   Received from [facility]   Overall Financial Resource Strain (CARDIA)    Difficulty of Paying Living Expenses: Not hard at all  Food Insecurity: No Food Insecurity (05/25/2024)   Received from [facility]   Epic    Within the past 12 months, you worried that your food would run out before you got the money to buy more.: Never true    Within the past 12 months, the food you bought just didn't last and you didn't have money to get more.: Never true  Transportation Needs: No Transportation Needs (05/25/2024)   Received from [facility] - Transportation    In the past 12 months, has lack of transportation kept you from medical appointments or from getting medications?: No    Lack of Transportation (Non-Medical): No  Physical Activity: Inactive (12/07/2021)   Received from [facility], [facility] visits prior to 08/07/2022.   Exercise Vital Sign    On average, how many days per week do you engage in moderate to strenuous exercise (like a brisk walk)?: 0 days    On average, how many minutes do you engage in exercise at this level?: 10 min  Stress: Stress Concern Present (12/07/2021)   Received from [facility], [facility] visits prior to 08/07/2022.   [employer] - Occupational Stress Questionnaire    Feeling of Stress : Very much  Social Connections: Moderately Integrated (12/07/2021)   Received from [facility], [facility] visits prior to 08/07/2022.   Social Connection and Isolation Panel    In a typical week, how many times do you talk on the phone with family, friends, or neighbors?: More than three times a week    How often do  you get together with friends or relatives?: Three times a week    How often do you attend church or religious services?: More than 4 times per year    Do you belong to any clubs or organizations such as church groups, unions, fraternal or athletic groups, or school groups?: Yes    How often do you attend meetings of the clubs or organizations you belong to?: More than 4 times per year    Are you married, widowed, divorced, separated, never  married, or living with a partner?: Divorced  Intimate Partner Violence: Not At Risk (05/10/2024)   Epic    Fear of Current or Ex-Partner: No    Emotionally Abused: No    Physically Abused: No    Sexually Abused: No  Depression (PHQ2-9): Not on file  Alcohol Screen: Not on file  Housing: Low Risk  (05/25/2024)   Received from [facility]   Epic    In the last 12 months, was there a time when you were not able to pay the mortgage or rent on time?: No    In the past 12 months, how many times have you moved where you were living?: 0    At any time in the past 12 months, were you homeless or living in a shelter (including now)?: No  Utilities: Not At Risk (05/25/2024)   Received from [facility]   Epic    In the past 12 months has the electric, gas, oil, or water company threatened to shut off services in your home?: No  Health Literacy: Not on file     RELEVANT GI HISTORY, IMAGING AND LABS: CBC    Component Value Date/Time   WBC 11.9 (H) 05/11/2024 0735   RBC 3.78 (L) 05/11/2024 0735   HGB 11.7 (L) 05/11/2024 0735   HCT 32.8 (L) 05/11/2024 0735   PLT 240 05/11/2024 0735   MCV 86.8 05/11/2024 0735   MCH 31.0 05/11/2024 0735   MCHC 35.7 05/11/2024 0735   RDW 12.8 05/11/2024 0735   LYMPHSABS 1.3 03/21/2023 2152   MONOABS 0.5 03/21/2023 2152   EOSABS 0.1 03/21/2023 2152   BASOSABS 0.0 03/21/2023 2152   Recent Labs    08/27/23 1109 12/28/23 2038 04/23/24 1101 05/10/24 0931 05/11/24 0735  HGB 15.2* 14.3 13.4 13.6 11.7*    CMP     Component Value Date/Time   NA 136 05/11/2024 0735   NA 138 12/01/2018 1008   K 4.3 05/11/2024 0735   CL 108 05/11/2024 0735   CO2 19 (L) 05/11/2024 0735   GLUCOSE 126 (H) 05/11/2024 0735   BUN 14 05/11/2024 0735   BUN 18 12/01/2018 1008   CREATININE 0.83 05/11/2024 0735   CREATININE 1.05 03/16/2016 1643   CALCIUM  8.2 (L) 05/11/2024 0735   PROT 6.6 04/23/2024 1101   PROT 6.7 12/01/2018 1008    ALBUMIN 4.1 04/23/2024 1101   ALBUMIN 4.5 12/01/2018 1008   AST 59 (H) 04/23/2024 1101   ALT 64 (H) 04/23/2024 1101   ALKPHOS 117 04/23/2024 1101   BILITOT 0.3 04/23/2024 1101   BILITOT 0.4 12/01/2018 1008   GFRNONAA >60 05/11/2024 0735   GFRNONAA 60 03/16/2016 1643   GFRAA 63 12/01/2018 1008   GFRAA 70 03/16/2016 1643      Latest Ref Rng & Units 04/23/2024   11:01 AM 08/27/2023   11:09 AM 03/21/2023    9:52 PM  Hepatic Function  Total Protein 6.5 - 8.1 g/dL 6.6  7.3  6.4   Albumin 3.5 - 5.0 g/dL 4.1  4.1  3.7   AST 15 - 41 U/L 59  57  38   ALT 0 - 44 U/L 64  120  56   Alk Phosphatase 38 - 126 U/L 117  99  86   Total Bilirubin 0.0 - 1.2 mg/dL 0.3  0.6  0.6   Bilirubin, Direct 0.0 - 0.2 mg/dL  <9.8        Review of Systems   All systems reviewed and negative except where noted in HPI.    Physical Exam  BP 117/82   Pulse 74   Temp (!) 97.2 F (36.2 C)   Ht 5' 4 (1.626 m)   Wt 125 lb 6.4 oz (56.9 kg)   SpO2 97%   BMI 21.52 kg/m  No LMP recorded. Patient has had a hysterectomy. General:   Alert and oriented. Pleasant and cooperative. Well-nourished and well-developed.  Head:  Normocephalic and atraumatic. Eyes:  Without icterus Ears:  Normal auditory acuity. Lungs:  Respirations even and unlabored.  Clear throughout to auscultation.   No wheezes, crackles, or rhonchi. No acute distress. Heart:  Regular rate and rhythm; no murmurs, clicks, rubs, or gallops. Abdomen:  Normal bowel sounds.  No bruits.  Soft, non-tender and non-distended without masses, hepatosplenomegaly or hernias noted.  No guarding or rebound tenderness.   Rectal:  Deferred. Msk:  Symmetrical without gross deformities. Normal posture. Extremities:  Without edema. Neurologic:  Alert and  oriented x4;  grossly normal neurologically. Skin:  Intact without significant lesions or rashes. Psych:  Alert and cooperative. Normal mood and affect.   Assessment & Plan   [name] Rowe is a 64 y.o. female  presenting today follow recent treatment for pancolitis.   Pancolitis. Resolving acute episode with mild residual symptoms.  - Ordered CBC and fecal calprotectin to assess for inflammation or infection. - Advised adequate hydration. - Recommended low fiber diet with gradual food reintroduction. - Scheduled follow-up in three months. - discussed possible post-infectious irritable bowel syndrome - Offered antispasmodic medication for pain if needed, pending stool study results.  Follow up in 3 months or sooner pending labs  Grayce Bohr, DNP, AGNP-C [facility]  "

## 2024-06-11 ENCOUNTER — Ambulatory Visit (INDEPENDENT_AMBULATORY_CARE_PROVIDER_SITE_OTHER): Admitting: Family Medicine

## 2024-06-11 ENCOUNTER — Encounter: Payer: Self-pay | Admitting: Family Medicine

## 2024-06-11 VITALS — BP 117/82 | HR 74 | Temp 97.2°F | Ht 64.0 in | Wt 125.4 lb

## 2024-06-11 DIAGNOSIS — R197 Diarrhea, unspecified: Secondary | ICD-10-CM

## 2024-06-11 DIAGNOSIS — K529 Noninfective gastroenteritis and colitis, unspecified: Secondary | ICD-10-CM | POA: Diagnosis not present

## 2024-06-12 ENCOUNTER — Ambulatory Visit: Payer: Self-pay | Admitting: Family Medicine

## 2024-06-12 LAB — CBC WITH DIFFERENTIAL/PLATELET
Basophils Absolute: 0 x10E3/uL (ref 0.0–0.2)
Basos: 1 %
EOS (ABSOLUTE): 0.2 x10E3/uL (ref 0.0–0.4)
Eos: 3 %
Hematocrit: 41.6 % (ref 34.0–46.6)
Hemoglobin: 13.8 g/dL (ref 11.1–15.9)
Immature Grans (Abs): 0 x10E3/uL (ref 0.0–0.1)
Immature Granulocytes: 0 %
Lymphocytes Absolute: 1.6 x10E3/uL (ref 0.7–3.1)
Lymphs: 30 %
MCH: 30.7 pg (ref 26.6–33.0)
MCHC: 33.2 g/dL (ref 31.5–35.7)
MCV: 93 fL (ref 79–97)
Monocytes Absolute: 0.4 x10E3/uL (ref 0.1–0.9)
Monocytes: 8 %
Neutrophils Absolute: 3.1 x10E3/uL (ref 1.4–7.0)
Neutrophils: 58 %
Platelets: 347 x10E3/uL (ref 150–450)
RBC: 4.49 x10E6/uL (ref 3.77–5.28)
RDW: 13.2 % (ref 11.7–15.4)
WBC: 5.3 x10E3/uL (ref 3.4–10.8)

## 2024-06-14 LAB — CALPROTECTIN, FECAL: Calprotectin, Fecal: 16 ug/g (ref 0–120)

## 2024-06-19 ENCOUNTER — Other Ambulatory Visit: Payer: Self-pay | Admitting: Family Medicine

## 2024-06-19 DIAGNOSIS — R197 Diarrhea, unspecified: Secondary | ICD-10-CM

## 2024-06-19 NOTE — Progress Notes (Signed)
 Patient sent a message in MyChart reporting having diarrhea that started again 4 days ago with abdominal discomfort.  Will check stool pathogen studies.

## 2024-06-20 ENCOUNTER — Other Ambulatory Visit: Payer: Self-pay | Admitting: Cardiology

## 2024-06-21 NOTE — Telephone Encounter (Signed)
 Lipid Panel done on 01/13/24. CMP done on 05/08/24.

## 2024-06-23 ENCOUNTER — Other Ambulatory Visit: Payer: Self-pay | Admitting: Cardiology

## 2024-06-25 ENCOUNTER — Ambulatory Visit: Payer: Self-pay | Admitting: Family Medicine

## 2024-06-25 ENCOUNTER — Telehealth: Payer: Self-pay

## 2024-06-25 DIAGNOSIS — R197 Diarrhea, unspecified: Secondary | ICD-10-CM

## 2024-06-25 LAB — GI PROFILE, STOOL, PCR

## 2024-06-25 NOTE — Telephone Encounter (Signed)
 Spoke with patient and she will go back to lab- also asked her to have the lab tech explain to her in full detail the collection process.  GI stool profile canceled due to specimen received over filled.   Dorothe, please call patient and let her know that she will need to go back and get another collection kit and to make sure not to overfill sample cup.  Thanks GI Profile, Stool, PCR

## 2024-06-28 NOTE — Telephone Encounter (Signed)
Spoke to pt and informed her of below

## 2024-06-29 ENCOUNTER — Telehealth: Payer: Self-pay

## 2024-06-29 ENCOUNTER — Ambulatory Visit: Admission: RE | Admit: 2024-06-29 | Source: Ambulatory Visit

## 2024-06-29 DIAGNOSIS — R109 Unspecified abdominal pain: Secondary | ICD-10-CM

## 2024-06-29 LAB — GI PROFILE, STOOL, PCR

## 2024-06-29 NOTE — Telephone Encounter (Signed)
 Patient notified to have CT done and labs as well.   Stat CT Abdomen/Pelvis- nothing by mouth until after the scan- Outpatient Imaging arrive at 12:30.  After the scan have labs done.  GI stool profile negative.    Called patient myself to discuss results.  She continues to have diarrhea and yellow stools.  Continues to have left lower quadrant abdominal pain.  Will check fecal elastase and plan for repeat CT scan.   Dorothe, you please get patient scheduled for a STAT CT abdomen pelvis with contrast.  She is expecting a callback from you, thanks. Also I want her to see Dr. Theophilus very soon. Thanks.

## 2024-06-29 NOTE — Telephone Encounter (Signed)
 Spoke with patient-CT scan rescheduled 07/02/24 due to not having PA yet-its in review- patient was instructed to go to ER if pain increases.

## 2024-06-29 NOTE — Addendum Note (Signed)
 Addended by: CELESTIA RIMA on: 06/29/2024 11:21 AM   Modules accepted: Orders

## 2024-06-29 NOTE — Progress Notes (Signed)
 GI stool profile negative.   Called patient myself to discuss results.  She continues to have diarrhea and yellow stools.  Continues to have left lower quadrant abdominal pain.  Will check fecal elastase and plan for repeat CT scan.  [name] Rowe, you please get patient scheduled for a STAT CT abdomen pelvis with contrast.  She is expecting a callback from you, thanks. Also I want her to see Dr. Theophilus very soon. Thanks.   RE

## 2024-07-02 ENCOUNTER — Ambulatory Visit

## 2024-07-05 ENCOUNTER — Ambulatory Visit
Admission: RE | Admit: 2024-07-05 | Discharge: 2024-07-05 | Disposition: A | Discharge status: Home / Self Care | Source: Ambulatory Visit | Attending: Family Medicine | Admitting: Family Medicine

## 2024-07-05 DIAGNOSIS — R109 Unspecified abdominal pain: Secondary | ICD-10-CM | POA: Diagnosis present

## 2024-07-05 MED ORDER — IOHEXOL 9 MG/ML PO SOLN
500.0000 mL | ORAL | Status: AC
  Administered 2024-07-05 (×2): 500 mL via ORAL

## 2024-07-05 MED ORDER — IOHEXOL 300 MG/ML  SOLN
85.0000 mL | Freq: Once | INTRAMUSCULAR | Status: AC | PRN
  Administered 2024-07-05: 85 mL via INTRAVENOUS

## 2024-07-08 ENCOUNTER — Ambulatory Visit: Payer: Self-pay | Admitting: Family Medicine

## 2024-09-10 ENCOUNTER — Ambulatory Visit: Admitting: Family Medicine
